# Patient Record
Sex: Female | Born: 1935 | Race: Black or African American | Hispanic: No | Marital: Married | Smoking: Former smoker
Health system: Southern US, Community
[De-identification: ages and names within clinical notes are randomized; demographics above are authoritative.]

## PROBLEM LIST (undated history)

## (undated) DIAGNOSIS — K5792 Diverticulitis of intestine, part unspecified, without perforation or abscess without bleeding: Secondary | ICD-10-CM

## (undated) DIAGNOSIS — K56699 Other intestinal obstruction unspecified as to partial versus complete obstruction: Secondary | ICD-10-CM

## (undated) DIAGNOSIS — E785 Hyperlipidemia, unspecified: Secondary | ICD-10-CM

## (undated) DIAGNOSIS — M5412 Radiculopathy, cervical region: Secondary | ICD-10-CM

## (undated) DIAGNOSIS — J449 Chronic obstructive pulmonary disease, unspecified: Secondary | ICD-10-CM

## (undated) DIAGNOSIS — M47812 Spondylosis without myelopathy or radiculopathy, cervical region: Secondary | ICD-10-CM

## (undated) HISTORY — PX: COLONOSCOPY: SHX174

## (undated) HISTORY — DX: Radiculopathy, cervical region: M54.12

## (undated) HISTORY — DX: Spondylosis without myelopathy or radiculopathy, cervical region: M47.812

## (undated) HISTORY — DX: Chronic obstructive pulmonary disease, unspecified: J44.9

## (undated) HISTORY — PX: EYE SURGERY: SHX253

## (undated) HISTORY — DX: Hyperlipidemia, unspecified: E78.5

---

## 1998-08-30 ENCOUNTER — Encounter: Payer: Self-pay | Admitting: Internal Medicine

## 1998-08-30 ENCOUNTER — Ambulatory Visit (HOSPITAL_COMMUNITY): Admission: RE | Admit: 1998-08-30 | Discharge: 1998-08-30 | Payer: Self-pay | Admitting: Internal Medicine

## 2002-01-01 ENCOUNTER — Inpatient Hospital Stay (HOSPITAL_COMMUNITY): Admission: EM | Admit: 2002-01-01 | Discharge: 2002-01-02 | Payer: Self-pay | Admitting: Emergency Medicine

## 2002-01-07 ENCOUNTER — Encounter: Admission: RE | Admit: 2002-01-07 | Discharge: 2002-01-07 | Payer: Self-pay | Admitting: Internal Medicine

## 2002-01-07 ENCOUNTER — Encounter: Payer: Self-pay | Admitting: Internal Medicine

## 2003-02-27 ENCOUNTER — Encounter: Payer: Self-pay | Admitting: Internal Medicine

## 2003-02-27 ENCOUNTER — Encounter: Admission: RE | Admit: 2003-02-27 | Discharge: 2003-02-27 | Payer: Self-pay | Admitting: Internal Medicine

## 2004-10-21 ENCOUNTER — Encounter: Admission: RE | Admit: 2004-10-21 | Discharge: 2004-10-21 | Payer: Self-pay | Admitting: Internal Medicine

## 2005-05-07 ENCOUNTER — Ambulatory Visit: Payer: Self-pay | Admitting: Internal Medicine

## 2005-11-05 ENCOUNTER — Encounter: Admission: RE | Admit: 2005-11-05 | Discharge: 2005-11-05 | Payer: Self-pay | Admitting: Internal Medicine

## 2005-11-17 ENCOUNTER — Ambulatory Visit: Payer: Self-pay | Admitting: Internal Medicine

## 2005-11-20 ENCOUNTER — Ambulatory Visit: Payer: Self-pay | Admitting: Internal Medicine

## 2006-11-25 ENCOUNTER — Encounter: Admission: RE | Admit: 2006-11-25 | Discharge: 2006-11-25 | Payer: Self-pay | Admitting: Internal Medicine

## 2006-12-10 ENCOUNTER — Emergency Department (HOSPITAL_COMMUNITY): Admission: EM | Admit: 2006-12-10 | Discharge: 2006-12-10 | Payer: Self-pay | Admitting: Family Medicine

## 2008-01-04 ENCOUNTER — Encounter: Admission: RE | Admit: 2008-01-04 | Discharge: 2008-01-04 | Payer: Self-pay | Admitting: Internal Medicine

## 2008-10-03 ENCOUNTER — Encounter (INDEPENDENT_AMBULATORY_CARE_PROVIDER_SITE_OTHER): Payer: Self-pay | Admitting: *Deleted

## 2008-10-29 ENCOUNTER — Encounter: Payer: Self-pay | Admitting: Internal Medicine

## 2009-09-10 ENCOUNTER — Ambulatory Visit: Payer: Self-pay | Admitting: Endocrinology

## 2009-09-10 DIAGNOSIS — J069 Acute upper respiratory infection, unspecified: Secondary | ICD-10-CM | POA: Insufficient documentation

## 2009-09-10 DIAGNOSIS — F17200 Nicotine dependence, unspecified, uncomplicated: Secondary | ICD-10-CM | POA: Insufficient documentation

## 2009-09-20 ENCOUNTER — Telehealth: Payer: Self-pay | Admitting: Gastroenterology

## 2010-08-12 NOTE — Assessment & Plan Note (Signed)
Summary: SINUS/ LAST SAW DR Jonny Ruiz IN 2007/ NEEDS TO RE-EST/NWS   Vital Signs:  Patient profile:   77 year old female Height:      63 inches (160.02 cm) Weight:      132.50 pounds (60.23 kg) BMI:     23.56 O2 Sat:      97 % on Room air Temp:     98.2 degrees F (36.78 degrees C) oral Pulse rate:   73 / minute BP sitting:   128 / 80  (left arm) Cuff size:   regular  Vitals Entered By: Josph Macho RMA (September 10, 2009 2:46 PM)  O2 Flow:  Room air CC: Sinus, head hurts X2-3weeks, fever X1week/ CF   CC:  Sinus, head hurts X2-3weeks, and fever X1week/ CF.  History of Present Illness: pt states 2 weeks of nasal congestion, bifrontal headache.  she has associated fever x 2 days.  she smokes 1 1/2 packs/day.    Current Medications (verified): 1)  Benadryl .... As Needed  Allergies (verified): No Known Drug Allergies  Past History:  Past Medical History: SMOKER (ICD-305.1)  Review of Systems  The patient denies dyspnea on exertion and prolonged cough.    Physical Exam  General:  normal appearance.   Head:  head: no deformity eyes: no periorbital swelling, no proptosis external nose and ears are normal mouth: no lesion seen Neck:  Supple without thyroid enlargement or tenderness.  Lungs:  Clear to auscultation bilaterally. Normal respiratory effort.    Impression & Recommendations:  Problem # 1:  URI (ICD-465.9) Assessment New  Problem # 2:  SMOKER (ICD-305.1)  Medications Added to Medication List This Visit: 1)  Benadryl  .... As needed 2)  Cefuroxime Axetil 250 Mg Tabs (Cefuroxime axetil) .Marland Kitchen.. 1 two times a day  Other Orders: Prescription Created Electronically [id number]) New Patient Level III (60454)  Patient Instructions: 1)  cefuroxime 250 mg two times a day.   2)  change benadryl to loratadine-d (non-prescription) as needed for congestion.   3)  we discussed cigarette smoking medications.   4)  please make an appointment with dr  Jonny Ruiz. Prescriptions: CEFUROXIME AXETIL 250 MG TABS (CEFUROXIME AXETIL) 1 two times a day  #14 x 0   Entered and Authorized by:   Minus Breeding MD   Signed by:   Minus Breeding MD on 09/10/2009   Method used:   Electronically to        [facility]  [phone]* (retail)       [address]       [place], [place]  [id number]       Ph: [phone] or [phone]       Fax: [phone]   RxID:   [id number]

## 2010-08-12 NOTE — Progress Notes (Signed)
Summary: Schedule recall colonoscopy   Phone Note Outgoing Call Call back at Home Phone [phone]   Call placed by: Christie Nottingham CMA Duncan Dull),  September 20, 2009 4:02 PM Call placed to: Patient Summary of Call: Called pt to schedule recall colonoscopy. Pt states she is very busy this month and the next. She states she will call back next month to schedule her colonscopy for May. Informed her the importance of scheduling her procedure b/c of her family history of colon cancer.  Initial call taken by: Christie Nottingham CMA Duncan Dull),  September 20, 2009 4:04 PM

## 2010-09-03 ENCOUNTER — Encounter: Payer: Self-pay | Admitting: Internal Medicine

## 2010-09-03 ENCOUNTER — Other Ambulatory Visit: Payer: Self-pay | Admitting: Internal Medicine

## 2010-09-03 ENCOUNTER — Encounter (INDEPENDENT_AMBULATORY_CARE_PROVIDER_SITE_OTHER): Payer: Self-pay | Admitting: *Deleted

## 2010-09-03 ENCOUNTER — Ambulatory Visit (INDEPENDENT_AMBULATORY_CARE_PROVIDER_SITE_OTHER): Payer: Medicare Other | Admitting: Internal Medicine

## 2010-09-03 ENCOUNTER — Other Ambulatory Visit: Payer: Medicare Other

## 2010-09-03 DIAGNOSIS — R5383 Other fatigue: Secondary | ICD-10-CM | POA: Insufficient documentation

## 2010-09-03 DIAGNOSIS — M79609 Pain in unspecified limb: Secondary | ICD-10-CM

## 2010-09-03 DIAGNOSIS — M5412 Radiculopathy, cervical region: Secondary | ICD-10-CM | POA: Insufficient documentation

## 2010-09-03 DIAGNOSIS — R5381 Other malaise: Secondary | ICD-10-CM

## 2010-09-03 DIAGNOSIS — E785 Hyperlipidemia, unspecified: Secondary | ICD-10-CM

## 2010-09-03 HISTORY — DX: Hyperlipidemia, unspecified: E78.5

## 2010-09-03 HISTORY — DX: Radiculopathy, cervical region: M54.12

## 2010-09-03 LAB — CBC WITH DIFFERENTIAL/PLATELET
Basophils Absolute: 0 10*3/uL (ref 0.0–0.1)
Eosinophils Relative: 1.4 % (ref 0.0–5.0)
HCT: 42.2 % (ref 36.0–46.0)
Hemoglobin: 14.2 g/dL (ref 12.0–15.0)
Lymphocytes Relative: 42.8 % (ref 12.0–46.0)
Monocytes Relative: 8.7 % (ref 3.0–12.0)
Neutro Abs: 3.7 10*3/uL (ref 1.4–7.7)
RDW: 15 % — ABNORMAL HIGH (ref 11.5–14.6)
WBC: 7.8 10*3/uL (ref 4.5–10.5)

## 2010-09-03 LAB — LDL CHOLESTEROL, DIRECT: Direct LDL: 187.3 mg/dL

## 2010-09-03 LAB — URINALYSIS
Hgb urine dipstick: NEGATIVE
Nitrite: NEGATIVE
Specific Gravity, Urine: 1.02 (ref 1.000–1.030)
Urobilinogen, UA: 0.2 (ref 0.0–1.0)

## 2010-09-03 LAB — HEPATIC FUNCTION PANEL
ALT: 11 U/L (ref 0–35)
AST: 26 U/L (ref 0–37)
Albumin: 4 g/dL (ref 3.5–5.2)
Alkaline Phosphatase: 53 U/L (ref 39–117)
Total Bilirubin: 0.5 mg/dL (ref 0.3–1.2)

## 2010-09-03 LAB — BASIC METABOLIC PANEL
Calcium: 9.5 mg/dL (ref 8.4–10.5)
GFR: 118.08 mL/min (ref >60.00)
Glucose, Bld: 82 mg/dL (ref 70–99)
Potassium: 4.4 mEq/L (ref 3.5–5.1)
Sodium: 143 mEq/L (ref 135–145)

## 2010-09-03 LAB — LIPID PANEL
HDL: 78.9 mg/dL (ref >39.00)
VLDL: 16 mg/dL (ref 0.0–40.0)

## 2010-09-04 ENCOUNTER — Other Ambulatory Visit (HOSPITAL_COMMUNITY): Payer: Self-pay | Admitting: Gastroenterology

## 2010-09-04 DIAGNOSIS — M5412 Radiculopathy, cervical region: Secondary | ICD-10-CM

## 2010-09-09 NOTE — Assessment & Plan Note (Signed)
Summary: L ARM NUMBNESS-ACHE----STC   Vital Signs:  Patient profile:   77 year old female Height:      62 inches Weight:      131.38 pounds BMI:     24.12 O2 Sat:      96 % on Room air Temp:     98.5 degrees F oral Pulse rate:   81 / minute BP sitting:   130 / 78  (left arm) Cuff size:   regular  Vitals Entered By: Zella Ball Ewing CMA Duncan Dull) (September 03, 2010 1:55 PM)  O2 Flow:  Room air  Preventive Care Screening     declines flu, pneumonia, tetanus;  is due for colonscopy and assents to this (husbnad urges her)   CC: Left arm numbness, left hand tingles, left leg pain/RE   CC:  Left arm numbness, left hand tingles, and left leg pain/RE.  History of Present Illness: here for f/u  - normally healthy overall and avoids doctors;  Pt denies CP, worsening sob, doe, wheezing, orthopnea, pnd, worsening LE edema, palps, dizziness or syncope  Pt denies new neuro symptoms such as headache, facial or extremity weakness  Pt denies polydipsia, polyuria   Overall good compliance with meds, trying to follow low chol diet, wt stable, little excercise however  Does not take meds or ASA.  Denies worsening depressive symptoms, suicidal ideation, or panic, though may have some ongoing anxiety but stable.  No fever, wt loss, night sweats, loss of appetite or other constitutional symptoms  Pt states good ability with ADL's, low fall risk, home safety reviewed and adequate, no significant change in hearing or vision, trying to follow lower chol diet, and occasionally active only with regular excercise.  Does have some LUE pain and numbness that seems worst at the left upper arm near the shoulder and less the more distal arm, but some feeling in the left neck as well;  has FROM of the shoulder with some incrased discomfort with abduction, but mild only, and not clear if this is the pain she refers to.  Pain overall mild, nonexertional, no better or worse with anything.  Not tried any otc med such as tylenol.    Also with pain and tenderness (without swelling, redness, weakness or numbness ) to the post left calf and heel area for over 1 wk without no obvious inciting event such as prolonged standing or walking, or trauma, fever, wt loss or LBP.  Does also have ongoing mild fatigue, but no daytime somnolence or other today  Preventive Screening-Counseling & Management  Alcohol-Tobacco     Smoking Status: current      Drug Use:  no.    Problems Prior to Update: 1)  Uri  (ICD-465.9) 2)  Smoker  (ICD-305.1)  Medications Prior to Update: 1)  Cefuroxime Axetil 250 Mg Tabs (Cefuroxime Axetil) .Marland Kitchen.. 1 Two Times A Day  Current Medications (verified): 1)  Aspir-Low 81 Mg Tbec (Aspirin) .Marland Kitchen.. 1po Once Daily 2)  Tramadol Hcl 100 Mg Xr24h-Tab (Tramadol Hcl) .Marland Kitchen.. 1 By Mouth Once Daily  As Needed Pain  Allergies (verified): No Known Drug Allergies  Past History:  Family History: Last updated: 09/03/2010 HTN  Social History: Last updated: 09/03/2010 Married Current Smoker Alcohol use-no Drug use-no Retired 1 son  Risk Factors: Smoking Status: current (09/03/2010)  Past Medical History: Reviewed history from 09/10/2009 and no changes required. SMOKER (ICD-305.1)  Past Surgical History: Denies surgical history  Family History: HTN  Social History: Reviewed history and no changes required.  Married Current Smoker Alcohol use-no Drug use-no Retired 1 sonSmoking Status:  current Drug Use:  no  Review of Systems  The patient denies anorexia, fever, vision loss, decreased hearing, hoarseness, chest pain, syncope, dyspnea on exertion, peripheral edema, prolonged cough, headaches, hemoptysis, abdominal pain, melena, hematochezia, severe indigestion/heartburn, hematuria, muscle weakness, suspicious skin lesions, transient blindness, difficulty walking, depression, unusual weight change, abnormal bleeding, enlarged lymph nodes, and angioedema.         all otherwise negative per pt -     Physical Exam  General:  alert and well-developed.   Head:  normocephalic and atraumatic.   Eyes:  vision grossly intact, pupils equal, and pupils round.   Ears:  R ear normal and L ear normal.   Nose:  no external deformity and no nasal discharge.   Mouth:  no gingival abnormalities and pharynx pink and moist.   Neck:  supple and no masses.   Lungs:  normal respiratory effort and normal breath sounds.   Heart:  normal rate and regular rhythm.   Abdomen:  soft, non-tender, and normal bowel sounds.   Msk:  no joint tenderness and no joint swelling. except for tender to left shoudler bursa area ; also marked tender to the left achilles area without sweling or redness Extremities:  no edema, no erythema  Neurologic:  strength normal in all extremities, sensation intact to light touch, gait normal, and DTRs symmetrical and normal.   Skin:  color normal and no rashes.   Psych:  not depressed appearing and moderately anxious.     Impression & Recommendations:  Problem # 1:  ARM PAIN, LEFT (ICD-729.5)  suspect left shoudler bursitis +/- left cervical radicular symptoms;  for c-spine MRI, pain med, refer ortho;  ECG also reviewed today - sinus with PAC  Orders: EKG w/ Interpretation (93000) Orthopedic Surgeon Referral (Ortho Surgeon)  Problem # 2:  LEG PAIN, LEFT (ICD-729.5) c/w achilles tendonitis - treat as above, f/u any worsening signs or symptoms  - with tramadol as needed   Problem # 3:  FATIGUE (ICD-780.79)  exam benign, to check labs below; follow with expectant management   Orders: TLB-BMP (Basic Metabolic Panel-BMET) (80048-METABOL) TLB-CBC Platelet - w/Differential (85025-CBCD) TLB-Hepatic/Liver Function Pnl (80076-HEPATIC) TLB-TSH (Thyroid Stimulating Hormone) (84443-TSH) TLB-Udip ONLY (81003-UDIP)  Problem # 4:  HYPERLIPIDEMIA (ICD-272.4)  mild  in past at best - Pt to continue diet efforts, good med tolerance; to check labs - goal LDL less than 100    Orders: TLB-Lipid Panel (80061-LIPID)  Complete Medication List: 1)  Aspir-low 81 Mg Tbec (Aspirin) .Marland Kitchen.. 1po once daily 2)  Tramadol Hcl 100 Mg Xr24h-tab (Tramadol hcl) .Marland Kitchen.. 1 by mouth once daily  as needed pain  Other Orders: Gastroenterology Referral (GI) Radiology Referral (Radiology)  Patient Instructions: 1)  Your EKG was ok today 2)  Please stop smoking 3)  Please go to the Lab in the basement for your blood and/or urine tests today  4)  You will be contacted about the referral(s) to: colonoscopy, MRI for the neck, and the orthopedic referral 5)  Please remember to call for your yearly mammogram (I would suggest [facility] on [address], or [employer] on [address]) 6)  Please take all new medications as prescribed - the tramadol for pain as needed (or tylenol 8 Hour might work ok) 7)  Please call the number on the [facility] Card for results of your testing  8)  Please schedule a follow-up appointment in 1 year, or sooner if needed  9)  Please also stop at the front desk to clarify your insurance as they are not aware of the medicare primary insurance apparently 10)  Take an Aspirin every day - 81 mg - 1 per day - COATED only - to help prevent stroke or heart attack Prescriptions: TRAMADOL HCL 100 MG XR24H-TAB (TRAMADOL HCL) 1 by mouth once daily  as needed pain  #30 x 1   Entered and Authorized by:   Corwin Levins MD   Signed by:   Corwin Levins MD on 09/03/2010   Method used:   Print then Give to Patient   RxID:   [id number]    Orders Added: 1)  EKG w/ Interpretation [93000] 2)  TLB-BMP (Basic Metabolic Panel-BMET) [80048-METABOL] 3)  TLB-CBC Platelet - w/Differential [85025-CBCD] 4)  TLB-Hepatic/Liver Function Pnl [80076-HEPATIC] 5)  TLB-TSH (Thyroid Stimulating Hormone) [84443-TSH] 6)  TLB-Lipid Panel [80061-LIPID] 7)  TLB-Udip ONLY [81003-UDIP] 8)  Gastroenterology Referral [GI] 9)  Radiology Referral [Radiology] 10)  Orthopedic Surgeon Referral [Ortho  Surgeon] 11)  Est. Patient Level V [14782]

## 2010-09-16 ENCOUNTER — Encounter (INDEPENDENT_AMBULATORY_CARE_PROVIDER_SITE_OTHER): Payer: Self-pay | Admitting: *Deleted

## 2010-09-16 ENCOUNTER — Ambulatory Visit (HOSPITAL_COMMUNITY)
Admission: RE | Admit: 2010-09-16 | Discharge: 2010-09-16 | Disposition: A | Discharge status: Home / Self Care | Payer: Medicare Other | Source: Ambulatory Visit | Attending: Gastroenterology | Admitting: Gastroenterology

## 2010-09-16 DIAGNOSIS — M5412 Radiculopathy, cervical region: Secondary | ICD-10-CM

## 2010-09-16 DIAGNOSIS — R209 Unspecified disturbances of skin sensation: Secondary | ICD-10-CM | POA: Insufficient documentation

## 2010-09-16 DIAGNOSIS — M47812 Spondylosis without myelopathy or radiculopathy, cervical region: Secondary | ICD-10-CM | POA: Insufficient documentation

## 2010-09-16 DIAGNOSIS — R29898 Other symptoms and signs involving the musculoskeletal system: Secondary | ICD-10-CM | POA: Insufficient documentation

## 2010-09-23 NOTE — Letter (Signed)
Summary: Pre Visit Letter Revised  [facility]  [address] [place], [place] [address]   Phone: [phone]  Fax: [phone]        09/16/2010 MRN: [id number] Theresa Arnold [address] [place], [place]  [address]  [place]             Procedure Date:  October 28, 2010   recall col Dr Nedra Hai to the Gastroenterology Division at [facility].    You are scheduled to see a nurse for your pre-procedure visit on October 14, 2010 at 1:00pm on the 3rd floor at [facility], [address].  We ask that you try to arrive at our office 15 minutes prior to your appointment time to allow for check-in.  Please take a minute to review the attached form.  If you answer "Yes" to one or more of the questions on the first page, we ask that you call the person listed at your earliest opportunity.  If you answer "No" to all of the questions, please complete the rest of the form and bring it to your appointment.    Your nurse visit will consist of discussing your medical and surgical history, your immediate family medical history, and your medications.   If you are unable to list all of your medications on the form, please bring the medication bottles to your appointment and we will list them.  We will need to be aware of both prescribed and over the counter drugs.  We will need to know exact dosage information as well.    Please be prepared to read and sign documents such as consent forms, a financial agreement, and acknowledgement forms.  If necessary, and with your consent, a friend or relative is welcome to sit-in on the nurse visit with you.  Please bring your insurance card so that we may make a copy of it.  If your insurance requires a referral to see a specialist, please bring your referral form from your primary care physician.  No co-pay is required for this nurse visit.     If you cannot keep your appointment, please call [phone] to cancel or reschedule  prior to your appointment date.  This allows [place] the opportunity to schedule an appointment for another patient in need of care.    Thank you for choosing [facility] for your medical needs.  We appreciate the opportunity to care for you.  Please visit [place] at our website  to learn more about our practice.  Sincerely, The Gastroenterology Division

## 2010-10-07 ENCOUNTER — Other Ambulatory Visit (INDEPENDENT_AMBULATORY_CARE_PROVIDER_SITE_OTHER): Payer: Medicare Other

## 2010-10-07 ENCOUNTER — Other Ambulatory Visit (INDEPENDENT_AMBULATORY_CARE_PROVIDER_SITE_OTHER): Payer: Medicare Other | Admitting: Internal Medicine

## 2010-10-07 ENCOUNTER — Ambulatory Visit (INDEPENDENT_AMBULATORY_CARE_PROVIDER_SITE_OTHER): Payer: Medicare Other | Admitting: Internal Medicine

## 2010-10-07 ENCOUNTER — Encounter: Payer: Self-pay | Admitting: Internal Medicine

## 2010-10-07 DIAGNOSIS — R5381 Other malaise: Secondary | ICD-10-CM

## 2010-10-07 DIAGNOSIS — M79609 Pain in unspecified limb: Secondary | ICD-10-CM

## 2010-10-07 DIAGNOSIS — E785 Hyperlipidemia, unspecified: Secondary | ICD-10-CM

## 2010-10-07 DIAGNOSIS — Z79899 Other long term (current) drug therapy: Secondary | ICD-10-CM

## 2010-10-07 DIAGNOSIS — R5383 Other fatigue: Secondary | ICD-10-CM

## 2010-10-07 LAB — HEPATIC FUNCTION PANEL: Albumin: 4.1 g/dL (ref 3.5–5.2)

## 2010-10-07 LAB — LIPID PANEL
Cholesterol: 202 mg/dL — ABNORMAL HIGH (ref 0–200)
HDL: 85.9 mg/dL (ref >39.00)
Triglycerides: 45 mg/dL (ref 0.0–149.0)
VLDL: 9 mg/dL (ref 0.0–40.0)

## 2010-10-07 NOTE — Progress Notes (Signed)
  Subjective:    Patient ID: Theresa Arnold, female    DOB: [birth year], 77 y.o.   MRN: [id number]  HPI  Here to f/u, now s/p MRI neck, did see orthopedic - now taking prednisone (finished in 3 days) for possible left CTS (left arm improved, only taken one pain pill since then) - still tingles somewhat but no pain or weakness;  Overall good compliance with treatment, and good medicine tolerability. , including the statin - without myalgias, constipation or slowed cognitiion.  Pt denies chest pain, increased sob or doe, wheezing, orthopnea, PND, increased LE swelling, palpitations, dizziness or syncope.    Pt denies new neurological symptoms such as new headache, or facial or extremity weakness or numbness.   Pt denies polydipsia, polyuria, Pt states overall good compliance with meds, trying to follow lower cholesterol, diabetic diet, wt overall stable but little exercise however.      Pt denies fever, wt loss, night sweats, loss of appetite, or other constitutional symptoms , does have some fatigue without hypersomnia or other compalints  Review of Systems  Constitutional: Negative for diaphoresis and unexpected weight change.  HENT: Negative for drooling and tinnitus.   Eyes: Negative for photophobia and visual disturbance.  Respiratory: Negative for choking and stridor.   Gastrointestinal: Negative for vomiting and blood in stool.  Genitourinary: Negative for hematuria and decreased urine volume.  Musculoskeletal: Negative for gait problem.  Skin: Negative for color change and wound.  Neurological: Negative for tremors and numbness.  Psychiatric/Behavioral: Negative for decreased concentration. The patient is not hyperactive.       Review of Systems     Objective:   Physical Exam Physical Exam  VS noted Constitutional: Pt appears well-developed and well-nourished.  HENT: Head: Normocephalic.  Right Ear: External ear normal.  Left Ear: External ear normal.  Eyes: Conjunctivae and EOM  are normal. Pupils are equal, round, and reactive to light.  Neck: Normal range of motion. Neck supple.  Cardiovascular: Normal rate and regular rhythm.   Pulmonary/Chest: Effort normal and breath sounds normal.  Abd:  Soft, NT, non-distended, + BS Neurological: Pt is alert. No cranial nerve deficit.  Skin: Skin is warm. No erythema.  Psychiatric: Pt behavior is normal. Thought content normal.         Assessment & Plan:

## 2010-10-07 NOTE — Assessment & Plan Note (Signed)
Also for lft's today to f/u on the statin

## 2010-10-07 NOTE — Patient Instructions (Signed)
Continue all other medications as before Please go to LAB in the Basement for the blood and/or urine tests to be done today Please call the number on the [facility] Card for results in 2-3 days Please followup in feb 2013 , or sooner if needed

## 2010-10-07 NOTE — Assessment & Plan Note (Addendum)
stable overall by hx and exam, ok to continue meds/tx as is , to check lipids today after 4 wks on statin, goal ldl < 100  No results found for this basename: LDLCALC

## 2010-10-07 NOTE — Assessment & Plan Note (Signed)
Improved after ortho eval and prelim dx left CTS; cont to follow, has 4 more days or prednisone, consider EMG/NCS or surgical evaluation

## 2010-10-07 NOTE — Assessment & Plan Note (Signed)
Mild , exam benign, Exam otherwise benign, follow with expectant management

## 2010-10-14 ENCOUNTER — Ambulatory Visit (AMBULATORY_SURGERY_CENTER): Payer: Medicare Other

## 2010-10-14 VITALS — Ht 62.0 in | Wt 129.6 lb

## 2010-10-14 DIAGNOSIS — Z1211 Encounter for screening for malignant neoplasm of colon: Secondary | ICD-10-CM

## 2010-10-14 MED ORDER — PEG-KCL-NACL-NASULF-NA ASC-C 100 G PO SOLR: 1.0000 | Freq: Once | ORAL | Status: AC

## 2010-10-28 ENCOUNTER — Ambulatory Visit (AMBULATORY_SURGERY_CENTER): Payer: Medicare Other | Admitting: Gastroenterology

## 2010-10-28 ENCOUNTER — Other Ambulatory Visit: Payer: Self-pay | Admitting: Gastroenterology

## 2010-10-28 ENCOUNTER — Ambulatory Visit (HOSPITAL_COMMUNITY)
Admission: RE | Admit: 2010-10-28 | Discharge: 2010-10-28 | Disposition: A | Discharge status: Home / Self Care | Payer: Medicare Other | Source: Ambulatory Visit | Attending: Gastroenterology | Admitting: Gastroenterology

## 2010-10-28 ENCOUNTER — Encounter: Payer: Self-pay | Admitting: Gastroenterology

## 2010-10-28 VITALS — BP 98/53 | HR 72 | Temp 97.7°F | Resp 22 | Ht 62.0 in | Wt 129.0 lb

## 2010-10-28 DIAGNOSIS — K5730 Diverticulosis of large intestine without perforation or abscess without bleeding: Secondary | ICD-10-CM | POA: Insufficient documentation

## 2010-10-28 DIAGNOSIS — Z800 Family history of malignant neoplasm of digestive organs: Secondary | ICD-10-CM | POA: Insufficient documentation

## 2010-10-28 DIAGNOSIS — R933 Abnormal findings on diagnostic imaging of other parts of digestive tract: Secondary | ICD-10-CM

## 2010-10-28 DIAGNOSIS — Z1211 Encounter for screening for malignant neoplasm of colon: Secondary | ICD-10-CM

## 2010-10-28 DIAGNOSIS — D126 Benign neoplasm of colon, unspecified: Secondary | ICD-10-CM

## 2010-10-28 MED ORDER — SODIUM CHLORIDE 0.9 % IV SOLN: 500.0000 mL | INTRAVENOUS | Status: DC

## 2010-10-28 NOTE — Patient Instructions (Signed)
Pt instructed to go [facility] rad. For barium enema ,report faxed to [facility], information given on diverticulosis and polyps.

## 2010-10-29 ENCOUNTER — Telehealth: Payer: Self-pay

## 2010-10-29 ENCOUNTER — Telehealth: Payer: Self-pay | Admitting: *Deleted

## 2010-10-29 NOTE — Telephone Encounter (Signed)
Pt set up to see Dr. Arlyce Dice 11/24/10@10 :30am. Pt aware of appt date and time.

## 2010-10-29 NOTE — Telephone Encounter (Signed)
Message copied by Chrystie Nose on Wed Oct 29, 2010  9:50 AM ------      Message from: Merri Ray      Created: Wed Oct 29, 2010  8:29 AM                   ----- Message -----         From: Louis Meckel, MD         Sent: 10/29/2010   8:22 AM           To: Merri Ray, CMA            Needs f/u OV

## 2010-10-29 NOTE — Telephone Encounter (Signed)
Follow up Call- Patient questions:  Do you have a fever, pain , or abdominal swelling? no Pain Score  0 *  Have you tolerated food without any problems? yes  Have you been able to return to your normal activities? yes  Do you have any questions about your discharge instructions: Diet   no Medications  no Follow up visit  no  Do you have questions or concerns about your Care? yes -Reviewed recommendations from procedure report with pt. Pt verbalized understanding. Actions: * If pain score is 4 or above: No action needed, pain <4.

## 2010-11-24 ENCOUNTER — Ambulatory Visit (INDEPENDENT_AMBULATORY_CARE_PROVIDER_SITE_OTHER): Payer: Medicare Other | Admitting: Gastroenterology

## 2010-11-24 ENCOUNTER — Encounter: Payer: Self-pay | Admitting: Gastroenterology

## 2010-11-24 DIAGNOSIS — D126 Benign neoplasm of colon, unspecified: Secondary | ICD-10-CM

## 2010-11-24 DIAGNOSIS — K5730 Diverticulosis of large intestine without perforation or abscess without bleeding: Secondary | ICD-10-CM | POA: Insufficient documentation

## 2010-11-24 NOTE — Assessment & Plan Note (Signed)
Because of severity of her disease at future colonoscopy she will receive propofol sedation and glucagon.

## 2010-11-24 NOTE — Patient Instructions (Addendum)
Polyps, Colon  A polyp is extra tissue that grows inside your body. Colon polyps grow in the large intestine. The large intestine, also called the colon, is part of your digestive system. It is a long, hollow tube at the end of your digestive tract where your body makes and stores stool. Most polyps are not dangerous. They are benign. This means they are not cancerous. But over time, some types of polyps can turn into cancer. Polyps that are smaller than a pea are usually not harmful. But larger polyps could someday become or may already be cancerous. To be safe, doctors remove all polyps and test them.  WHO GETS POLYPS? Anyone can get polyps, but certain people are more likely than others. You may have a greater chance of getting polyps if:  You are over 50.   You have had polyps before.   Someone in your family has had polyps.   Someone in your family has had cancer of the large intestine.   Find out if someone in your family has had polyps. You may also be more likely to get polyps if you:   Eat a lot of fatty foods   Smoke   Drink alcohol   Do not exercise  Eat too much  SYMPTOMS Most small polyps do not cause symptoms. People often do not know they have one until their caregiver finds it during a regular checkup or while testing them for something else. Some people do have symptoms like these:  Bleeding from the anus. You might notice blood on your underwear or on toilet paper after you have had a bowel movement.   Constipation or diarrhea that lasts more than a week.   Blood in the stool. Blood can make stool look black or it can show up as red streaks in the stool.  If you have any of these symptoms, see your caregiver. HOW DOES THE DOCTOR TEST FOR POLYPS? The doctor can use four tests to check for polyps:  Digital rectal exam. The caregiver wears gloves and checks your rectum (the last part of the large intestine) to see if it feels normal. This test would find polyps only  in the rectum. Your caregiver may need to do one of the other tests listed below to find polyps higher up in the intestine.   Barium enema. The caregiver puts a liquid called barium into your rectum before taking x-rays of your large intestine. Barium makes your intestine look white in the pictures. Polyps are dark, so they are easy to see.   Sigmoidoscopy. With this test, the caregiver can see inside your large intestine. A thin flexible tube is placed into your rectum. The device is called a sigmoidoscope, which has a light and a tiny video camera in it. The caregiver uses the sigmoidoscope to look at the last third of your large intestine.   Colonoscopy. This test is like sigmoidoscopy, but the caregiver looks at all of the large intestine. It usually requires sedation. This is the most common method for finding and removing polyps.  TREATMENT  The caregiver will remove the polyp during sigmoidoscopy or colonoscopy. The polyp is then tested for cancer.   If you have had polyps, your caregiver may want you to get tested regularly in the future.  PREVENTION There is not one sure way to prevent polyps. You might be able to lower your risk of getting them if you:  Eat more fruits and vegetables and less fatty food.     Do not smoke.   Avoid alcohol.   Exercise every day.   Lose weight if you are overweight.   Eating more calcium and folate can also lower your risk of getting polyps. Some foods that are rich in calcium are milk, cheese, and broccoli. Some foods that are rich in folate are chickpeas, kidney beans, and spinach.   Aspirin might help prevent polyps. Studies are under way.  Document Released: 03/25/2004 Document Re-Released: 12/17/2009 [facility] Patient Information 2011 [place], [place].Cc. Bayard Males Diverticulosis Diverticulosis is a common condition that develops when small pouches (diverticula) form in the wall of the colon. The risk of diverticulosis increases with age.  It happens more often in people who eat a low-fiber diet. Most individuals with diverticulosis have no symptoms. Those individuals with symptoms usually experience belly (abdominal) pain, constipation, or loose stools (diarrhea). HOME CARE INSTRUCTIONS  Increase the amount of fiber in your diet as directed by your caregiver or dietician. This may reduce symptoms of diverticulosis.   Your caregiver may recommend taking a dietary fiber supplement.   Drink at least 6 to 8 glasses of water each day to prevent constipation.   Try not to strain when you have a bowel movement.   Your caregiver may recommend avoiding nuts and seeds to prevent complications, although this is still an uncertain benefit.   Only take over-the-counter or prescription medicines for pain, discomfort, or fever as directed by your caregiver.  FOODS HAVING HIGH FIBER CONTENT INCLUDE:  Fruits. Apple, peach, pear, tangerine, raisins, prunes.   Vegetables. Brussels sprouts, asparagus, broccoli, cabbage, carrot, cauliflower, romaine lettuce, spinach, summer squash, tomato, winter squash, zucchini.   Starchy Vegetables. Baked beans, kidney beans, lima beans, split peas, lentils, potatoes (with skin).   Grains. Whole wheat bread, brown rice, bran flake cereal, plain oatmeal, white rice, shredded wheat, bran muffins.  SEEK IMMEDIATE MEDICAL CARE IF:  You develop increasing pain or severe bloating.   You have an oral temperature above 100  , not controlled by medicine.   You develop vomiting or bowel movements that are bloody or black.  Document Released: 03/26/2004 Document Re-Released: 12/17/2009 [facility] Patient Information 2011 [place], [place].  Your procedure is scheduled on 12/01/2010 at 3pm

## 2010-11-24 NOTE — Progress Notes (Signed)
History of Present Illness:  Mrs. Storlie is a pleasant 77 year old Afro-American female referred at the request of Dr. Jonny Ruiz following screening colonoscopy.  Screening colonoscopy demonstrated a 3 mm sigmoid polyp. Severe diverticulosis with spasm prevented further examination of the colon. This was completed by barium enema which did not demonstrate any other abnormalities. She has no GI complaints.    Review of Systems: Pertinent positive and negative review of systems were noted in the above HPI section. All other review of systems were otherwise negative.    Current Medications, Allergies, Past Medical History, Past Surgical History, Family History and Social History were reviewed in [employer] electronic medical record  Vital signs were reviewed in today's medical record. Physical Exam: General: Well developed , well nourished, no acute distress Head: Normocephalic and atraumatic Eyes:  sclerae anicteric, EOMI Ears: Normal auditory acuity Mouth: No deformity or lesions Lungs: Clear throughout to auscultation Heart: Regular rate and rhythm; no murmurs, rubs or bruits Abdomen: Soft, non tender and non distended. No masses, hepatosplenomegaly or hernias noted. Normal Bowel sounds Rectal:deferred Musculoskeletal: Symmetrical with no gross deformities  Pulses:  Normal pulses noted Extremities: No clubbing, cyanosis, edema or deformities noted Neurological: Alert oriented x 4, grossly nonfocal Psychological:  Alert and cooperative. Normal mood and affect

## 2010-11-24 NOTE — Assessment & Plan Note (Signed)
Plan sigmoidoscopy with polyp removal

## 2010-11-28 NOTE — Discharge Summary (Signed)
[place]. [facility]  Patient:    ASPIN, PALOMAREZ Visit Number: [id number] MRN: [id number]          Service Type: MED Location: [phone] Attending Physician:  Tresa Garter Dictated by:   Sonda Primes, M.D. [facility] Admit Date:  01/01/2002 Discharge Date: 01/02/2002   CC:         Corwin Levins, M.D. [facility]   Discharge Summary  HISTORY OF PRESENT ILLNESS AND HOSPITAL COURSE:  The patient is a 77 year old female who presented to the emergency room with left arm pain, numbness, and some chest discomfort was evaluated by Dr. Josie Saunders with a CT scan of the abdomen and chest and admitted to rule out MI.  For the details please address to my history and physical.  During the course of hospitalization, she had a series of troponin and CKs.  They were negative.  On the day of discharge, she is feeling well, she has no pain in the arm just some numbness in the hand, no neck pain.  Temperature 97.5, blood pressure 90/46, respirations 20, O2 saturations 96% on room air.  She is in no acute distress, looks well.  HEENT with moist mucosa.  Neck supple.  Lungs clear.  Heart regular, S1, S2, no murmur, no gallop.  Abdomen soft, nontender, no organomegaly, no mass felt. Lower extremities without edema.  LABORATORIES:  Troponin negative, CK negative.  B12 level pending.  CT scan of the chest negative for PE, there is some mild cardiomegaly. Abdominal CAT scan negative for AAA.  DISCHARGE DIAGNOSIS:  Atypical left arm pain likely due to cervical radiculopathy.  Myocardial infarction ruled out.  Would plan Cardiolite stress test as an outpatient.  May need cervical spine MRI if no better.  DISCHARGE MEDICATIONS: 1. Protonix 40 mg once a day for 10 days. 2. Darvocet-N 100 q.i.d. p.r.n. pain. 3. Ibuprofen 600 p.o. b.i.d. p.r.n. pain with food.  ACTIVITY:  As tolerated.  FOLLOWUP PLANS:  Dr. Jonny Ruiz in 5-7 days.  SPECIAL INSTRUCTIONS:  Call if problems. Dictated  by:   Sonda Primes, M.D. [facility] Attending Physician:  Tresa Garter DD:  01/02/02 TD:  01/03/02 Job: [id number]

## 2010-11-28 NOTE — H&P (Signed)
[place]. [facility]  Patient:    Theresa Arnold, Theresa Arnold Visit Number: [id number] MRN: [id number]          Service Type: MED Location: [phone] Attending Physician:  Tresa Garter Dictated by:   Sonda Primes, M.D. [facility] Admit Date:  01/01/2002 Discharge Date: 01/02/2002   CC:         Corwin Levins, M.D. [facility]   History and Physical  DATE OF BIRTH:  03-30-1932  CHIEF COMPLAINT:  Left arm pain.  HISTORY OF PRESENT ILLNESS:  The patient is a 77 year old female who presents with pain between shoulder blades and left arm pain and numbness off and on of couple of days duration.  She is taking a bunch of aspirin and Aleve for it. Never had these problems before.  They do not seem to be exertional.  No radiation to neck or chest.  Dr. Brayton Caves evaluated the patient and was concerned about the possibility of myocardial infarction.  She underwent a CT of the chest and abdomen to rule out aneurysm and embolism.  PAST MEDICAL HISTORY:  Unremarkable.  ALLERGIES:  No known drug allergies.  MEDICATIONS:  Aleve and aspirin.  NO prescription medications.  SOCIAL HISTORY:  Smokes one pack a day.  She is retired and she is married.  FAMILY HISTORY:  Sister with AAA, one brother with heart attack, and father with coronary artery disease.  REVIEW OF SYSTEMS:  Negative for chest pain, no indigestion, no syncope, no neurologic complaints other than numbness in fingers.  The rest is negative.  PHYSICAL EXAMINATION:  VITAL SIGNS:  Temperature 98.2, blood pressure 137/73, pulse 64, respirations 20, O2 saturation 98% on room air.  GENERAL:  She is in no acute distress.  HEENT:  Moist mucosa.  NECK:  Supple.  No thyromegaly or bruit.  LUNGS:  Clear to auscultation and percussion.  HEART:  S1 and S2.  No enlargement to percussion.  ABDOMEN:  Soft and nontender.  No organomegaly and normocephalic.  EXTREMITIES:  Lower extremities without edema.  Calves  nontender.  SKIN:  Clear.  NECK:  Nontender.  Slight tenderness behind the shoulder blades.  Shoulders with full range of motion.  Grip normal bilaterally.  LABORATORY DATA:  CT of the abdomen and chest; mild cardiomegaly, negative for PE or AAA.  Liver function tests normal.  Blood gas with pH 7.4, pCO2 38.5, pO2 62.  CK 141, MB 2.9%.  EKG with sinus bradycardia, first degree AV block, LAD.  ASSESSMENT: 1. Left shoulder pain, rule out myocardial infarction.  CK x3, troponin x3    q.8h.  Will not anticoagulate at present.  May need Cardiolite as an    outpatient. 2. Left arm pain and paresthesia.  Possible cervical radiculopathy.  Will use    NSAIDs outpatient therapy and workup. Dictated by:   Sonda Primes, M.D. [facility] Attending Physician:  Tresa Garter DD:  01/01/02 TD:  01/02/02 Job: [id number]

## 2010-12-01 ENCOUNTER — Ambulatory Visit (AMBULATORY_SURGERY_CENTER): Payer: Medicare Other | Admitting: Gastroenterology

## 2010-12-01 ENCOUNTER — Encounter: Payer: Self-pay | Admitting: Gastroenterology

## 2010-12-01 VITALS — BP 106/64 | HR 64 | Temp 98.7°F | Resp 16 | Ht 62.0 in | Wt 129.0 lb

## 2010-12-01 DIAGNOSIS — D126 Benign neoplasm of colon, unspecified: Secondary | ICD-10-CM

## 2010-12-01 DIAGNOSIS — Z86010 Personal history of colonic polyps: Secondary | ICD-10-CM

## 2010-12-01 DIAGNOSIS — K5730 Diverticulosis of large intestine without perforation or abscess without bleeding: Secondary | ICD-10-CM

## 2010-12-01 MED ORDER — SODIUM CHLORIDE 0.9 % IV SOLN: 500.0000 mL | INTRAVENOUS | Status: DC

## 2010-12-01 NOTE — Patient Instructions (Signed)
HANDOUT GIVEN ON POLYPS  WE WILL MAIL YOU A LETTER IN 1- WEEKS WITH RESULTS OF YOUR BIOPSY AND DR Marzetta Board RECOMMENDATIONS OF YOUR NEXT PROCEDURE YEAR

## 2010-12-02 ENCOUNTER — Telehealth: Payer: Self-pay

## 2010-12-02 NOTE — Telephone Encounter (Signed)

## 2011-04-07 ENCOUNTER — Telehealth: Payer: Self-pay | Admitting: *Deleted

## 2011-04-07 NOTE — Telephone Encounter (Signed)
Spoke w/patient. She c/o body aches and sinus pain. She is resting and took OTC "vick's cold medicine" with relief last night. Advised pt to come in for OV if symptoms continued or changed.

## 2011-04-08 ENCOUNTER — Encounter: Payer: Self-pay | Admitting: Internal Medicine

## 2011-04-08 ENCOUNTER — Other Ambulatory Visit (INDEPENDENT_AMBULATORY_CARE_PROVIDER_SITE_OTHER): Payer: Medicare Other

## 2011-04-08 ENCOUNTER — Ambulatory Visit (INDEPENDENT_AMBULATORY_CARE_PROVIDER_SITE_OTHER): Payer: Medicare Other | Admitting: Internal Medicine

## 2011-04-08 DIAGNOSIS — R509 Fever, unspecified: Secondary | ICD-10-CM | POA: Insufficient documentation

## 2011-04-08 DIAGNOSIS — B370 Candidal stomatitis: Secondary | ICD-10-CM

## 2011-04-08 DIAGNOSIS — J029 Acute pharyngitis, unspecified: Secondary | ICD-10-CM

## 2011-04-08 LAB — URINALYSIS, ROUTINE W REFLEX MICROSCOPIC
Nitrite: NEGATIVE
Specific Gravity, Urine: 1.025 (ref 1.000–1.030)
Total Protein, Urine: 30
Urine Glucose: NEGATIVE
Urobilinogen, UA: 1 (ref 0.0–1.0)

## 2011-04-08 MED ORDER — NYSTATIN 100000 UNIT/ML MT SUSP: 500000.0000 [IU] | Freq: Four times a day (QID) | OROMUCOSAL | Status: AC

## 2011-04-08 MED ORDER — LEVOFLOXACIN 250 MG PO TABS: 250.0000 mg | ORAL_TABLET | Freq: Every day | ORAL | Status: AC

## 2011-04-08 NOTE — Assessment & Plan Note (Signed)
Rather severe fever for her age, cant r/o strep - Mild to mod, for antibx course,  to f/u any worsening symptoms or concerns

## 2011-04-08 NOTE — Patient Instructions (Addendum)
Take all new medications as prescribed Continue all other medications as before Please go to LAB in the Basement for the blood and/or urine tests to be done today Please call the phone number [phone] (the [employer]Tree System) for results of testing in 2-3 days;  When calling, simply dial the number, and when prompted enter the MRN number above (the Medical Record Number) and the # key, then the message should start.  

## 2011-04-08 NOTE — Assessment & Plan Note (Signed)
?   Viral illness vs other - for UA as well

## 2011-04-08 NOTE — Progress Notes (Signed)
Subjective:    Patient ID: Theresa Arnold, female    DOB: 1931/09/02, 77 y.o.   MRN: [id number]  HPI  Here to f/u, apparently mentioned to scheduler something about fluid retention, but denies this today. No fluid retention today Here with 2-3 days onset HA, ST, fever up to 101 at home, thirsty, unusual fatigue, but no overt sinus pain/pressure, cough and Pt denies chest pain, increased sob or doe, wheezing, orthopnea, PND, increased LE swelling, palpitations, dizziness or syncope.  Does also have white coating to tongue for over a wk as well.   Denies urinary symptoms such as dysuria, frequency, urgency,or hematuria.   Pt denies fever, wt loss, night sweats, loss of appetite, or other constitutional symptoms except for the above.  Pt denies new neurological symptoms such as new headache, or facial or extremity weakness or numbness   Pt denies polydipsia, polyuria.  Denies urinary symptoms such as dysuria, frequency, urgency,or hematuria.  No ill contacts, except for exposure to ill grandchild recently. Past Medical History  Diagnosis Date  . HYPERLIPIDEMIA 09/03/2010  . CERVICAL RADICULOPATHY, LEFT 09/03/2010   Past Surgical History  Procedure Date  . Colonoscopy     reports that she has been smoking.  She has never used smokeless tobacco. She reports that she does not drink alcohol or use illicit drugs. family history includes Breast cancer in her sister; Colon cancer in her sister; Heart disease in her father; and Hypertension in her other. No Known Allergies Current Outpatient Prescriptions on File Prior to Visit  Medication Sig Dispense Refill  . aspirin 325 MG EC tablet Take 325 mg by mouth daily.        Marland Kitchen atorvastatin (LIPITOR) 10 MG tablet Take 10 mg by mouth daily.        . diphenhydrAMINE (BENADRYL) 25 mg capsule Take 25 mg by mouth every 6 (six) hours as needed.        . traMADol (ULTRAM-ER) 100 MG 24 hr tablet Take 100 mg by mouth daily as needed.         Current  Facility-Administered Medications on File Prior to Visit  Medication Dose Route Frequency Provider Last Rate Last Dose  . 0.9 %  sodium chloride infusion  500 mL Intravenous Continuous Louis Meckel, MD      . 0.9 %  sodium chloride infusion  500 mL Intravenous Continuous Louis Meckel, MD       Review of Systems Review of Systems  Constitutional: Negative for diaphoresis and unexpected weight change.  HENT: Negative for drooling and tinnitus.   Eyes: Negative for photophobia and visual disturbance.  Respiratory: Negative for choking and stridor.   Gastrointestinal: Negative for vomiting and blood in stool. .       Objective:   Physical Exam BP 106/60  Pulse 77  Temp(Src) 99.9 F (37.7 C) (Oral)  Ht 5\' 2"  (1.575 m)  Wt 124 lb 8 oz (56.473 kg)  BMI 22.77 kg/m2  SpO2 98% Physical Exam  VS noted, mild ill appaering Constitutional: Pt appears well-developed and well-nourished.  HENT: Head: Normocephalic.  Right Ear: External ear normal.  Left Ear: External ear normal.   Tongue with whitish coating/thrush like Bilat tm's mild erythema.  Sinus nontender.  Pharynx moderythema, no exudate or neck LA Eyes: Conjunctivae and EOM are normal. Pupils are equal, round, and reactive to light.  Neck: Normal range of motion. Neck supple.  Cardiovascular: Normal rate and regular rhythm.   Pulmonary/Chest: Effort normal and breath sounds  normal.  Abd:  Soft, NT, non-distended, + BS, no flank tender Neurological: Pt is alert. No cranial nerve deficit.  Skin: Skin is warm. No erythema. No LE edema, rash or cellultiis Psychiatric: Pt behavior is normal. Thought content normal.     Assessment & Plan:

## 2011-04-08 NOTE — Assessment & Plan Note (Signed)
Also for nystatin asd,  to f/u any worsening symptoms or concerns,

## 2011-04-10 LAB — URINE CULTURE

## 2011-05-10 ENCOUNTER — Inpatient Hospital Stay (INDEPENDENT_AMBULATORY_CARE_PROVIDER_SITE_OTHER)
Admission: RE | Admit: 2011-05-10 | Discharge: 2011-05-10 | Disposition: A | Discharge status: Home / Self Care | Payer: Medicare Other | Source: Ambulatory Visit | Attending: Family Medicine | Admitting: Family Medicine

## 2011-05-10 DIAGNOSIS — L219 Seborrheic dermatitis, unspecified: Secondary | ICD-10-CM

## 2011-06-29 ENCOUNTER — Ambulatory Visit (INDEPENDENT_AMBULATORY_CARE_PROVIDER_SITE_OTHER): Payer: Medicare Other | Admitting: Internal Medicine

## 2011-06-29 ENCOUNTER — Encounter: Payer: Self-pay | Admitting: Internal Medicine

## 2011-06-29 DIAGNOSIS — R509 Fever, unspecified: Secondary | ICD-10-CM

## 2011-06-29 DIAGNOSIS — J029 Acute pharyngitis, unspecified: Secondary | ICD-10-CM

## 2011-06-29 MED ORDER — AMOXICILLIN 500 MG PO CAPS: 500.0000 mg | ORAL_CAPSULE | Freq: Three times a day (TID) | ORAL | Status: AC

## 2011-06-29 NOTE — Assessment & Plan Note (Signed)
Start amoxil 

## 2011-06-29 NOTE — Patient Instructions (Signed)
Strep Throat     Strep throat is an infection of the throat caused by a bacteria named Streptococcus pyogenes. Your caregiver may call the infection streptococcal "tonsillitis" or "pharyngitis" depending on whether there are signs of inflammation in the tonsils or back of the throat. Strep throat is most common in children from 5 to 75 years old during the cold months of the year, but it can occur in people of any age during any season. This infection is spread from person to person (contagious) through coughing, sneezing, or other close contact.  SYMPTOMS   · Fever or chills.   · Painful, swollen, red tonsils or throat.   · Pain or difficulty when swallowing.   · White or yellow spots on the tonsils or throat.   · Swollen, tender lymph nodes or "glands" of the neck or under the jaw.   · Red rash all over the body (rare).   DIAGNOSIS   Many different infections can cause the same symptoms. A test must be done to confirm the diagnosis so the right treatment can be given. A "rapid strep test" can help your caregiver make the diagnosis in a few minutes. If this test is not available, a light swab of the infected area can be used for a throat culture test. If a throat culture test is done, results are usually available in a day or two.  TREATMENT   Strep throat is treated with antibiotic medicine.  HOME CARE INSTRUCTIONS   · Gargle with 1 tsp of salt in 1 cup of warm water, 3 to 4 times per day or as needed for comfort.   · Family members who also have a sore throat or fever should be tested for strep throat and treated with antibiotics if they have the strep infection.   · Make sure everyone in your household washes their hands well.   · Do not share food, drinking cups, or personal items that could cause the infection to spread to others.   · You may need to eat a soft food diet until your sore throat gets better.   · Drink enough water and fluids to keep your urine clear or pale yellow. This will help prevent  dehydration.   · Get plenty of rest.   · Stay home from school, daycare, or work until you have been on antibiotics for 24 hours.   · Only take over-the-counter or prescription medicines for pain, discomfort, or fever as directed by your caregiver.   · If antibiotics are prescribed, take them as directed. Finish them even if you start to feel better.   SEEK MEDICAL CARE IF:   · The glands in your neck continue to enlarge.   · You develop a rash, cough, or earache.   · You cough up green, yellow-brown, or bloody sputum.   · You have pain or discomfort not controlled by medicines.   · Your problems seem to be getting worse rather than better.   SEEK IMMEDIATE MEDICAL CARE IF:   · You develop any new symptoms such as vomiting, severe headache, stiff or painful neck, chest pain, shortness of breath, or trouble swallowing.   · You develop severe throat pain, drooling, or changes in your voice.   · You develop swelling of the neck, or the skin on the neck becomes red and tender.   · You have a fever.   · You develop signs of dehydration, such as fatigue, dry mouth, and decreased urination.   · 

## 2011-06-29 NOTE — Assessment & Plan Note (Addendum)
All the s/s are c/w strep infection so I decided to treat with testing, start amoxil

## 2011-06-29 NOTE — Progress Notes (Signed)
Subjective:    Patient ID: Theresa Arnold, female    DOB: [birth year], 77 y.o.   MRN: [id number]  Sore Throat  This is a new problem. Episode onset: for 3 days. The problem has been gradually worsening. Neither side of throat is experiencing more pain than the other. The maximum temperature recorded prior to her arrival was 100 - 100.9 F. The fever has been present for 1 to 2 days. The pain is at a severity of 1/10. The pain is mild. Associated symptoms include headaches and swollen glands. Pertinent negatives include no abdominal pain, congestion, coughing, diarrhea, drooling, ear discharge, ear pain, hoarse voice, plugged ear sensation, neck pain, shortness of breath, stridor, trouble swallowing or vomiting. She has had exposure to strep. She has had no exposure to mono. She has tried acetaminophen for the symptoms. The treatment provided no relief.      Review of Systems  Constitutional: Positive for fatigue. Negative for fever, chills, diaphoresis, activity change, appetite change and unexpected weight change.  HENT: Negative for ear pain, congestion, hoarse voice, drooling, trouble swallowing, neck pain and ear discharge.   Eyes: Negative.   Respiratory: Negative for cough, chest tightness, shortness of breath and stridor.   Cardiovascular: Negative for chest pain, palpitations and leg swelling.  Gastrointestinal: Negative for nausea, vomiting, abdominal pain and diarrhea.  Genitourinary: Negative for dysuria, urgency, frequency, hematuria, flank pain, decreased urine volume, enuresis, difficulty urinating and dyspareunia.  Musculoskeletal: Positive for myalgias. Negative for back pain, joint swelling, arthralgias and gait problem.  Skin: Negative for color change, pallor, rash and wound.  Neurological: Positive for headaches. Negative for dizziness, tremors, seizures, syncope, facial asymmetry, speech difficulty, weakness, light-headedness and numbness.  Hematological: Negative for  adenopathy. Does not bruise/bleed easily.  Psychiatric/Behavioral: Negative.        Objective:   Physical Exam  Constitutional: She is oriented to person, place, and time. She appears well-developed and well-nourished. No distress.  HENT:  Head: No trismus in the jaw.  Right Ear: Hearing, tympanic membrane, external ear and ear canal normal.  Left Ear: Hearing, tympanic membrane, external ear and ear canal normal.  Nose: No mucosal edema, rhinorrhea or sinus tenderness. Right sinus exhibits no maxillary sinus tenderness and no frontal sinus tenderness. Left sinus exhibits no maxillary sinus tenderness and no frontal sinus tenderness.  Mouth/Throat: Mucous membranes are normal. Mucous membranes are not pale, not dry and not cyanotic. No oral lesions. No uvula swelling. Posterior oropharyngeal erythema present. No oropharyngeal exudate, posterior oropharyngeal edema or tonsillar abscesses.  Eyes: Conjunctivae are normal. Right eye exhibits no discharge. Left eye exhibits no discharge. No scleral icterus.  Neck: Normal range of motion. Neck supple. No JVD present. No tracheal deviation present.  Cardiovascular: Normal rate, regular rhythm, normal heart sounds and intact distal pulses.  Exam reveals no gallop and no friction rub.   No murmur heard. Pulmonary/Chest: Effort normal and breath sounds normal. No stridor. No respiratory distress. She has no wheezes. She has no rales. She exhibits no tenderness.  Abdominal: Soft. Bowel sounds are normal. She exhibits no distension and no mass. There is no tenderness. There is no rebound and no guarding.  Musculoskeletal: Normal range of motion. She exhibits no edema and no tenderness.  Lymphadenopathy:    She has no cervical adenopathy.  Neurological: She is oriented to person, place, and time.  Skin: Skin is warm and dry. No rash noted. She is not diaphoretic. No erythema. No pallor.  Psychiatric: She has a normal  mood and affect. Her behavior is  normal. Judgment and thought content normal.          Assessment & Plan:

## 2011-11-10 ENCOUNTER — Other Ambulatory Visit (INDEPENDENT_AMBULATORY_CARE_PROVIDER_SITE_OTHER): Payer: Medicare Other

## 2011-11-10 ENCOUNTER — Encounter: Payer: Self-pay | Admitting: Internal Medicine

## 2011-11-10 ENCOUNTER — Ambulatory Visit (INDEPENDENT_AMBULATORY_CARE_PROVIDER_SITE_OTHER)
Admission: RE | Admit: 2011-11-10 | Discharge: 2011-11-10 | Disposition: A | Discharge status: Home / Self Care | Payer: Medicare Other | Source: Ambulatory Visit | Attending: Internal Medicine | Admitting: Internal Medicine

## 2011-11-10 ENCOUNTER — Ambulatory Visit (INDEPENDENT_AMBULATORY_CARE_PROVIDER_SITE_OTHER): Payer: Medicare Other | Admitting: Internal Medicine

## 2011-11-10 VITALS — BP 112/70 | HR 60 | Temp 97.5°F | Ht 62.5 in | Wt 126.5 lb

## 2011-11-10 DIAGNOSIS — R5381 Other malaise: Secondary | ICD-10-CM

## 2011-11-10 DIAGNOSIS — M25562 Pain in left knee: Secondary | ICD-10-CM

## 2011-11-10 DIAGNOSIS — M79602 Pain in left arm: Secondary | ICD-10-CM | POA: Insufficient documentation

## 2011-11-10 DIAGNOSIS — E785 Hyperlipidemia, unspecified: Secondary | ICD-10-CM | POA: Diagnosis not present

## 2011-11-10 DIAGNOSIS — R5383 Other fatigue: Secondary | ICD-10-CM

## 2011-11-10 DIAGNOSIS — Z0000 Encounter for general adult medical examination without abnormal findings: Secondary | ICD-10-CM

## 2011-11-10 DIAGNOSIS — R42 Dizziness and giddiness: Secondary | ICD-10-CM | POA: Diagnosis not present

## 2011-11-10 DIAGNOSIS — R531 Weakness: Secondary | ICD-10-CM

## 2011-11-10 DIAGNOSIS — J449 Chronic obstructive pulmonary disease, unspecified: Secondary | ICD-10-CM | POA: Diagnosis not present

## 2011-11-10 DIAGNOSIS — M47812 Spondylosis without myelopathy or radiculopathy, cervical region: Secondary | ICD-10-CM | POA: Diagnosis not present

## 2011-11-10 DIAGNOSIS — M25569 Pain in unspecified knee: Secondary | ICD-10-CM

## 2011-11-10 DIAGNOSIS — M79609 Pain in unspecified limb: Secondary | ICD-10-CM | POA: Diagnosis not present

## 2011-11-10 DIAGNOSIS — R0600 Dyspnea, unspecified: Secondary | ICD-10-CM

## 2011-11-10 DIAGNOSIS — M1710 Unilateral primary osteoarthritis, unspecified knee: Secondary | ICD-10-CM | POA: Insufficient documentation

## 2011-11-10 DIAGNOSIS — R0989 Other specified symptoms and signs involving the circulatory and respiratory systems: Secondary | ICD-10-CM

## 2011-11-10 HISTORY — DX: Spondylosis without myelopathy or radiculopathy, cervical region: M47.812

## 2011-11-10 HISTORY — DX: Chronic obstructive pulmonary disease, unspecified: J44.9

## 2011-11-10 LAB — URINALYSIS, ROUTINE W REFLEX MICROSCOPIC
Hgb urine dipstick: NEGATIVE
Urine Glucose: NEGATIVE
Urobilinogen, UA: 0.2 (ref 0.0–1.0)

## 2011-11-10 LAB — CBC WITH DIFFERENTIAL/PLATELET
Basophils Absolute: 0.1 10*3/uL (ref 0.0–0.1)
Eosinophils Absolute: 0.1 10*3/uL (ref 0.0–0.7)
Hemoglobin: 13.6 g/dL (ref 12.0–15.0)
Lymphocytes Relative: 37.6 % (ref 12.0–46.0)
MCHC: 32.5 g/dL (ref 30.0–36.0)
Neutro Abs: 4.1 10*3/uL (ref 1.4–7.7)
Neutrophils Relative %: 53.1 % (ref 43.0–77.0)
Platelets: 170 10*3/uL (ref 150.0–400.0)
RDW: 15.1 % — ABNORMAL HIGH (ref 11.5–14.6)

## 2011-11-10 LAB — HEPATIC FUNCTION PANEL
ALT: 10 U/L (ref 0–35)
AST: 26 U/L (ref 0–37)
Alkaline Phosphatase: 56 U/L (ref 39–117)
Bilirubin, Direct: 0.1 mg/dL (ref 0.0–0.3)
Total Protein: 7.5 g/dL (ref 6.0–8.3)

## 2011-11-10 LAB — BASIC METABOLIC PANEL
CO2: 29 mEq/L (ref 19–32)
Chloride: 107 mEq/L (ref 96–112)
Sodium: 144 mEq/L (ref 135–145)

## 2011-11-10 LAB — LIPID PANEL: Total CHOL/HDL Ratio: 3

## 2011-11-10 MED ORDER — ATORVASTATIN CALCIUM 10 MG PO TABS: 10.0000 mg | ORAL_TABLET | Freq: Every day | ORAL | Status: DC

## 2011-11-10 NOTE — Assessment & Plan Note (Signed)
Pt unfort has been out of lipitor for close to 2 wks, to re-start, check lipdids, goal ldl < 100

## 2011-11-10 NOTE — Assessment & Plan Note (Signed)
?   Related to cervical spine - for ortho eval as well as for neck and knee pain

## 2011-11-10 NOTE — Progress Notes (Signed)
Subjective:    Patient ID: Theresa Arnold, female    DOB: 13-Aug-1931, 78 y.o.   MRN: [id number]  HPI  Here with husband, c/o several spells lasting 2 hr about 3 times in the past 2 wks, the worst being yesterday mod to severe with general weakness, sob and mild dizziness/head pressure.   Pt denies fever, wt loss, night sweats, loss of appetite, or other constitutional symptoms  Pt denies chest pain, increased sob or doe, wheezing, orthopnea, PND, increased LE swelling, palpitations, dizziness or syncope except for the above.  Pt denies new neurological symptoms such as new headache, or facial or extremity weakness or numbness except for the above.   Pt denies polydipsia, polyuria.  Needs refill lipitor, has been out for several wks.  Also incidentally with 2-3 wks worsening left knee pain and swelling with a sense of instability and near  Giveaway but no falls or trauma.   Past Medical History  Diagnosis Date  . HYPERLIPIDEMIA 09/03/2010  . CERVICAL RADICULOPATHY, LEFT 09/03/2010  . Cervical spondylosis 11/10/2011  . COPD (chronic obstructive pulmonary disease) 11/10/2011   Past Surgical History  Procedure Date  . Colonoscopy     reports that she has been smoking.  She has never used smokeless tobacco. She reports that she does not drink alcohol or use illicit drugs. family history includes Breast cancer in her sister; Colon cancer in her sister; Heart disease in her father; and Hypertension in her other. No Known Allergies Current Outpatient Prescriptions on File Prior to Visit  Medication Sig Dispense Refill  . aspirin 325 MG EC tablet Take 325 mg by mouth daily.        . diphenhydrAMINE (BENADRYL) 25 mg capsule Take 25 mg by mouth every 6 (six) hours as needed.        Marland Kitchen DISCONTD: atorvastatin (LIPITOR) 10 MG tablet Take 10 mg by mouth daily.         Current Facility-Administered Medications on File Prior to Visit  Medication Dose Route Frequency Provider Last Rate Last Dose  . DISCONTD:  0.9 %  sodium chloride infusion  500 mL Intravenous Continuous Louis Meckel, MD      . DISCONTD: 0.9 %  sodium chloride infusion  500 mL Intravenous Continuous Louis Meckel, MD       Review of Systems Review of Systems  Constitutional: Negative for diaphoresis and unexpected weight change.  HENT: Negative for drooling and tinnitus.   Eyes: Negative for photophobia and visual disturbance.  Respiratory: Negative for choking and stridor.   Gastrointestinal: Negative for vomiting and blood in stool.  Genitourinary: Negative for hematuria and decreased urine volume.  Musculoskeletal: Negative for gait problem.  Skin: Negative for color change and wound.  Neurological: Negative for tremors and numbness.  Psychiatric/Behavioral: Negative for decreased concentration. The patient is not hyperactive.      Objective:   Physical Exam BP 112/70  Pulse 60  Temp(Src) 97.5 F (36.4 C) (Oral)  Ht 5' 2.5" (1.588 m)  Wt 126 lb 8 oz (57.38 kg)  BMI 22.77 kg/m2  SpO2 96% Physical Exam  VS noted Constitutional: Pt appears well-developed and well-nourished.  HENT: Head: Normocephalic.  Right Ear: External ear normal.  Left Ear: External ear normal.  Eyes: Conjunctivae and EOM are normal. Pupils are equal, round, and reactive to light.  Neck: Normal range of motion. Neck supple.  Cardiovascular: Normal rate and regular rhythm.   Pulmonary/Chest: Effort normal and breath sounds decreased bilat, no rales or  wheezes Abd:  Soft, NT, non-distended, + BS Neurological: Pt is alert. No cranial nerve deficit. motor/dtr/gait intact Skin: Skin is warm. No erythema. No rash Psychiatric: Pt behavior is normal. Thought content normal.1+ nervous     Assessment & Plan:

## 2011-11-10 NOTE — Assessment & Plan Note (Addendum)
And vague head pressure/dizziness - for ecg today -ECG reviewed as per emr, also for labs  Note:  Time for pt hx, exam, review of record with pt, determination of diagnoses, and plan for further eval and in consultation with husband and pt is > 40 min

## 2011-11-10 NOTE — Assessment & Plan Note (Signed)
Unclear etiology, exam benign, for cxr

## 2011-11-10 NOTE — Patient Instructions (Addendum)
Your EKG was OK today You will be contacted regarding the referral for: orthopedic for the neck/arm/knee pain Continue all other medications as before; your lipitor was refilled today Please have the pharmacy call with any refills you may need. Please continue your efforts at being more active, low cholesterol diet, and weight control. Please go to LAB in the Basement for the blood and/or urine tests to be done today Please go to XRAY in the Basement for the x-ray test You will be contacted regarding the referral for: echocardiogram, and carotid dopplers You will be contacted by phone if any changes need to be made immediately.  Otherwise, you will receive a letter about your results with an explanation. Please return in 6 months, or sooner if needed

## 2011-11-10 NOTE — Assessment & Plan Note (Signed)
Unclear etiology, also for carotids and echo,  to f/u any worsening symptoms or concerns

## 2011-11-10 NOTE — Assessment & Plan Note (Signed)
Reviewed MRI with pt, for ortho eval

## 2011-11-10 NOTE — Assessment & Plan Note (Signed)
C/w marked flare of left knee DJD - for ortho eval

## 2011-11-11 ENCOUNTER — Encounter: Payer: Self-pay | Admitting: Internal Medicine

## 2011-11-11 LAB — LDL CHOLESTEROL, DIRECT: Direct LDL: 142.7 mg/dL

## 2011-11-12 ENCOUNTER — Other Ambulatory Visit: Payer: Self-pay | Admitting: *Deleted

## 2011-11-12 DIAGNOSIS — R42 Dizziness and giddiness: Secondary | ICD-10-CM

## 2011-11-18 ENCOUNTER — Other Ambulatory Visit (HOSPITAL_COMMUNITY): Payer: Self-pay | Admitting: Radiology

## 2011-11-18 DIAGNOSIS — R0600 Dyspnea, unspecified: Secondary | ICD-10-CM

## 2011-11-19 ENCOUNTER — Other Ambulatory Visit: Payer: Self-pay

## 2011-11-19 ENCOUNTER — Ambulatory Visit (HOSPITAL_COMMUNITY): Payer: Medicare Other | Attending: Cardiology

## 2011-11-19 ENCOUNTER — Encounter: Payer: Self-pay | Admitting: Internal Medicine

## 2011-11-19 DIAGNOSIS — R0989 Other specified symptoms and signs involving the circulatory and respiratory systems: Secondary | ICD-10-CM | POA: Insufficient documentation

## 2011-11-19 DIAGNOSIS — R0609 Other forms of dyspnea: Secondary | ICD-10-CM | POA: Insufficient documentation

## 2011-11-19 DIAGNOSIS — R5381 Other malaise: Secondary | ICD-10-CM | POA: Diagnosis not present

## 2011-11-19 DIAGNOSIS — E785 Hyperlipidemia, unspecified: Secondary | ICD-10-CM | POA: Diagnosis not present

## 2011-11-19 DIAGNOSIS — R0600 Dyspnea, unspecified: Secondary | ICD-10-CM

## 2011-11-20 ENCOUNTER — Encounter (INDEPENDENT_AMBULATORY_CARE_PROVIDER_SITE_OTHER): Payer: Medicare Other

## 2011-11-20 DIAGNOSIS — R42 Dizziness and giddiness: Secondary | ICD-10-CM

## 2011-11-20 DIAGNOSIS — I6529 Occlusion and stenosis of unspecified carotid artery: Secondary | ICD-10-CM

## 2011-11-23 ENCOUNTER — Encounter: Payer: Self-pay | Admitting: Internal Medicine

## 2011-11-27 DIAGNOSIS — M25559 Pain in unspecified hip: Secondary | ICD-10-CM | POA: Diagnosis not present

## 2011-11-27 DIAGNOSIS — M1710 Unilateral primary osteoarthritis, unspecified knee: Secondary | ICD-10-CM | POA: Diagnosis not present

## 2011-11-30 ENCOUNTER — Ambulatory Visit (INDEPENDENT_AMBULATORY_CARE_PROVIDER_SITE_OTHER): Payer: Medicare Other | Admitting: Internal Medicine

## 2011-11-30 DIAGNOSIS — J449 Chronic obstructive pulmonary disease, unspecified: Secondary | ICD-10-CM

## 2011-11-30 LAB — PULMONARY FUNCTION TEST

## 2011-11-30 NOTE — Progress Notes (Signed)
PFT done today. 

## 2011-12-02 DIAGNOSIS — L708 Other acne: Secondary | ICD-10-CM | POA: Diagnosis not present

## 2011-12-02 DIAGNOSIS — L219 Seborrheic dermatitis, unspecified: Secondary | ICD-10-CM | POA: Diagnosis not present

## 2011-12-10 ENCOUNTER — Other Ambulatory Visit: Payer: Self-pay | Admitting: Internal Medicine

## 2011-12-10 DIAGNOSIS — Z1231 Encounter for screening mammogram for malignant neoplasm of breast: Secondary | ICD-10-CM

## 2011-12-22 ENCOUNTER — Ambulatory Visit
Admission: RE | Admit: 2011-12-22 | Discharge: 2011-12-22 | Disposition: A | Discharge status: Home / Self Care | Payer: Medicare Other | Source: Ambulatory Visit | Attending: Internal Medicine | Admitting: Internal Medicine

## 2011-12-22 DIAGNOSIS — Z1231 Encounter for screening mammogram for malignant neoplasm of breast: Secondary | ICD-10-CM | POA: Diagnosis not present

## 2011-12-24 LAB — HM MAMMOGRAPHY

## 2012-05-10 ENCOUNTER — Ambulatory Visit: Payer: Medicare Other | Admitting: Internal Medicine

## 2012-05-19 DIAGNOSIS — L988 Other specified disorders of the skin and subcutaneous tissue: Secondary | ICD-10-CM | POA: Diagnosis not present

## 2012-05-19 DIAGNOSIS — L219 Seborrheic dermatitis, unspecified: Secondary | ICD-10-CM | POA: Diagnosis not present

## 2013-03-21 DIAGNOSIS — L219 Seborrheic dermatitis, unspecified: Secondary | ICD-10-CM | POA: Diagnosis not present

## 2013-06-28 ENCOUNTER — Ambulatory Visit (INDEPENDENT_AMBULATORY_CARE_PROVIDER_SITE_OTHER): Payer: Medicare Other | Admitting: Family Medicine

## 2013-06-28 ENCOUNTER — Other Ambulatory Visit (INDEPENDENT_AMBULATORY_CARE_PROVIDER_SITE_OTHER): Payer: Medicare Other

## 2013-06-28 ENCOUNTER — Encounter: Payer: Self-pay | Admitting: Family Medicine

## 2013-06-28 VITALS — BP 134/82 | HR 72 | Wt 138.0 lb

## 2013-06-28 DIAGNOSIS — M25579 Pain in unspecified ankle and joints of unspecified foot: Secondary | ICD-10-CM | POA: Diagnosis not present

## 2013-06-28 DIAGNOSIS — S93409A Sprain of unspecified ligament of unspecified ankle, initial encounter: Secondary | ICD-10-CM | POA: Diagnosis not present

## 2013-06-28 DIAGNOSIS — M25572 Pain in left ankle and joints of left foot: Secondary | ICD-10-CM

## 2013-06-28 NOTE — Progress Notes (Signed)
  CC: Ankle pain and swelling  HPI: Patient is a 79 year old female who was recently putting presents underneath the Christmas tree and rolled her ankle. Patient states that the pain was not so bad but she continued to have swelling of his left ankle. Patient would like it to be evaluated. Patient is able to ambulate without any significant discomfort but continues to have swelling on the side. Denies any worsening of the swelling. Denies any numbness or tingling. Patient also denies any significant bruising. She has been trying to use knee-high stockings with no significant improvement. Patient is doing some icing which seems to be somewhat helpful. Patient rates the severity approximately 2/10   Past medical, surgical, family and social history reviewed. Medications reviewed all in the electronic medical record.   Review of Systems: No headache, visual changes, nausea, vomiting, diarrhea, constipation, dizziness, abdominal pain, skin rash, fevers, chills, night sweats, weight loss, swollen lymph nodes, body aches, joint swelling, muscle aches, chest pain, shortness of breath, mood changes.   Objective:    Blood pressure 134/82, pulse 72, weight 138 lb (62.596 kg), SpO2 91.00%.   General: No apparent distress alert and oriented x3 mood and affect normal, dressed appropriately.  HEENT: Pupils equal, extraocular movements intact Respiratory: Patient's speak in full sentences and does not appear short of breath Cardiovascular: No lower extremity edema, non tender, no erythema Skin: Warm dry intact with no signs of infection or rash on extremities or on axial skeleton. Abdomen: Soft nontender Neuro: Cranial nerves II through XII are intact, neurovascularly intact in all extremities with 2+ DTRs and 2+ pulses. Lymph: No lymphadenopathy of posterior or anterior cervical chain or axillae bilaterally.  Gait normal with good balance and coordination.  MSK: Non tender with full range of motion and good  stability and symmetric strength and tone of shoulders, elbows, wrist, hip, knee and  bilaterally.  Ankle: Left Patient does have some mild swelling and potential effusion on the lateral aspect of the ankle Range of motion is full in all directions. Strength is 5/5 in all directions. Stable lateral and medial ligaments; squeeze test and kleiger test unremarkable; Talar dome nontender; No pain at base of 5th MT; No tenderness over cuboid; No tenderness over N spot or navicular prominence No tenderness on posterior aspects of lateral and medial malleolus No sign of peroneal tendon subluxations or tenderness to palpation Negative tarsal tunnel tinel's Able to walk 4 steps. Patient does have some mild pain to palpation over the ATFL  MSK US performed of: Left ankle This study was ordered, performed, and interpreted by Terrilee Files D.O.  Foot/Ankle:   All structures visualized.   Talar dome unremarkable  Ankle mortise with trace effusion. Peroneus longus and brevis tendons unremarkable on long and transverse views without sheath effusions. Posterior tibialis, flexor hallucis longus, and flexor digitorum longus tendons unremarkable on long and transverse views without sheath effusions. Achilles tendon visualized along length of tendon and unremarkable on long and transverse views without sheath effusion. Anterior Talofibular Ligament is intact the significant hypoechoic changes around the area. and Calcaneofibular Ligaments unremarkable and intact. Deltoid Ligament unremarkable and intact. Plantar fascia intact and without effusion, normal thickness. No increased doppler signal, cap sign, or thickening of tibial cortex. Power doppler signal normal.  IMPRESSION:  Grade 1 ankle sprain    Impression and Recommendations:     This case required medical decision making of moderate complexity.

## 2013-06-28 NOTE — Progress Notes (Signed)
Pre-visit discussion using our clinic review tool. No additional management support is needed unless otherwise documented below in the visit note.  

## 2013-06-28 NOTE — Assessment & Plan Note (Signed)
Patient does have lateral ankle sprain with some mild swelling. There is no fracture noted on ultrasound today. Further imaging is not necessary. Patient given home exercise program as well as a compression wrap the she'll wear a regular basis. Patient will come back again in 3 weeks if not completely resolved. I am optimistic that she will do very well.

## 2013-06-28 NOTE — Patient Instructions (Signed)
Great to meet you You have a grade `1 ankle sprain Try exercises most days of the week.  Tylenol for discomfort Ice 20 minutes 2 times a day Wear the compression during the day but take off at night.  Come back in 3 weeks if not gone.

## 2013-08-03 DIAGNOSIS — L259 Unspecified contact dermatitis, unspecified cause: Secondary | ICD-10-CM | POA: Diagnosis not present

## 2013-08-03 DIAGNOSIS — L219 Seborrheic dermatitis, unspecified: Secondary | ICD-10-CM | POA: Diagnosis not present

## 2013-08-16 ENCOUNTER — Ambulatory Visit (INDEPENDENT_AMBULATORY_CARE_PROVIDER_SITE_OTHER): Payer: Medicare Other | Admitting: Internal Medicine

## 2013-08-16 ENCOUNTER — Encounter: Payer: Self-pay | Admitting: Internal Medicine

## 2013-08-16 VITALS — BP 110/78 | HR 64 | Temp 97.9°F | Ht 62.0 in | Wt 138.5 lb

## 2013-08-16 DIAGNOSIS — J449 Chronic obstructive pulmonary disease, unspecified: Secondary | ICD-10-CM

## 2013-08-16 DIAGNOSIS — Z23 Encounter for immunization: Secondary | ICD-10-CM | POA: Diagnosis not present

## 2013-08-16 DIAGNOSIS — R109 Unspecified abdominal pain: Secondary | ICD-10-CM | POA: Diagnosis not present

## 2013-08-16 NOTE — Progress Notes (Signed)
Pre-visit discussion using our clinic review tool. No additional management support is needed unless otherwise documented below in the visit note.  

## 2013-08-16 NOTE — Progress Notes (Signed)
Subjective:    Patient ID: Theresa Arnold, female    DOB: [birth year], 80 y.o.   MRN: [id number]  HPI   Here to f/u with onset 3-4 wks GI c/o's, sometimes epigsastric, sometimes lower abd, crampy, usually mild to mod, occas severe, bent her over in bed one night last wk, assoc with n and regurgitation (not really vomiting) even small meals, often needs to have BM at same time as nausea.  No radiation to the back, no fever, no wt loss, denies constipation, no diarrheal, no hard stools, no blood or dark stools, lying down sometimes makes better.  Denies urinary symptoms such as dysuria, frequency, urgency, flank pain, hematuria or n/v, fever, chills.  No hx of renal stone.  Has had episodes of feeling of need for BM but only passes gas. On lipitor for long time, no prior problems, no hx of pancreatitis. Pt denies chest pain, increased sob or doe, wheezing, orthopnea, PND, increased LE swelling, palpitations, dizziness or syncope. Past Medical History  Diagnosis Date  . HYPERLIPIDEMIA 09/03/2010  . CERVICAL RADICULOPATHY, LEFT 09/03/2010  . Cervical spondylosis 11/10/2011  . COPD (chronic obstructive pulmonary disease) 11/10/2011   Past Surgical History  Procedure Laterality Date  . Colonoscopy      reports that she has been smoking.  She has never used smokeless tobacco. She reports that she does not drink alcohol or use illicit drugs. family history includes Breast cancer in her sister; Colon cancer in her sister; Heart disease in her father; Hypertension in her other. No Known Allergies Current Outpatient Prescriptions on File Prior to Visit  Medication Sig Dispense Refill  . aspirin 325 MG EC tablet Take 325 mg by mouth daily.        Marland Kitchen atorvastatin (LIPITOR) 10 MG tablet Take 1 tablet (10 mg total) by mouth daily.  90 tablet  3  . diphenhydrAMINE (BENADRYL) 25 mg capsule Take 25 mg by mouth every 6 (six) hours as needed.         No current facility-administered medications on file prior to  visit.   Review of Systems  Constitutional: Negative for unexpected weight change, or unusual diaphoresis  HENT: Negative for tinnitus.   Eyes: Negative for photophobia and visual disturbance.  Respiratory: Negative for choking and stridor.   Gastrointestinal: Negative for vomiting and blood in stool.  Genitourinary: Negative for hematuria and decreased urine volume.  Musculoskeletal: Negative for acute joint swelling Skin: Negative for color change and wound.  Neurological: Negative for tremors and numbness other than noted  Psychiatric/Behavioral: Negative for decreased concentration or  hyperactivity.       Objective:   Physical Exam BP 110/78  Pulse 64  Temp(Src) 97.9 F (36.6 C) (Oral)  Ht 5\' 2"  (1.575 m)  Wt 138 lb 8 oz (62.823 kg)  BMI 25.33 kg/m2  SpO2 93% VS noted,  Constitutional: Pt appears well-developed and well-nourished.  HENT: Head: NCAT.  Right Ear: External ear normal.  Left Ear: External ear normal.  Eyes: Conjunctivae and EOM are normal. Pupils are equal, round, and reactive to light.  Neck: Normal range of motion. Neck supple.  Cardiovascular: Normal rate and regular rhythm.   Pulmonary/Chest: Effort normal and breath sounds normal.  Abd:  Soft, NT, non-distended, + BS except for mid and left/llq mild to mod tender, no guarding/rebound, no flank tender Neurological: Pt is alert. Not confused  Skin: Skin is warm. No erythema.  Psychiatric: Pt behavior is normal. Thought content normal.  Assessment & Plan:

## 2013-08-16 NOTE — Assessment & Plan Note (Addendum)
Etiology unclear, afeb with signficant subjective pain, tender on exam,  - cant r/o pancreatitis - to d/c lipitor, and start empiric PPI trial with sample nexium for 5 days, for lab eval and abd film as ordered tomorrow am (lab and xray closed at this time), pending results will need to consider CT, also for GI referral (can be cancelled if pain resolves)

## 2013-08-16 NOTE — Patient Instructions (Addendum)
You had the new Prevnar pneumonia shot today  OK to stop the lipitor for now  OK to take the samples of OTC nexium at 2 pills per day; please call if this helps for a prescription for a similar medication  Please continue all other medications as before, and refills have been done if requested. Please have the pharmacy call with any other refills you may need.  Please go to the LAB in the Basement (turn left off the elevator) for the tests to be done tomorrow Please go to the XRAY Department in the Basement (go straight as you get off the elevator) for the x-ray testing - tomorrow  You will be contacted regarding the referral for: GI

## 2013-08-16 NOTE — Assessment & Plan Note (Signed)
stable overall by history and exam, recent data reviewed with pt, and pt to continue medical treatment as before,  to f/u any worsening symptoms or concerns SpO2 Readings from Last 3 Encounters:  08/16/13 93%  06/28/13 91%  11/10/11 96%

## 2013-08-17 ENCOUNTER — Other Ambulatory Visit (INDEPENDENT_AMBULATORY_CARE_PROVIDER_SITE_OTHER): Payer: Medicare Other

## 2013-08-17 ENCOUNTER — Ambulatory Visit (INDEPENDENT_AMBULATORY_CARE_PROVIDER_SITE_OTHER)
Admission: RE | Admit: 2013-08-17 | Discharge: 2013-08-17 | Disposition: A | Discharge status: Home / Self Care | Payer: Medicare Other | Source: Ambulatory Visit | Attending: Internal Medicine | Admitting: Internal Medicine

## 2013-08-17 ENCOUNTER — Encounter: Payer: Self-pay | Admitting: Internal Medicine

## 2013-08-17 ENCOUNTER — Telehealth: Payer: Self-pay | Admitting: Internal Medicine

## 2013-08-17 ENCOUNTER — Ambulatory Visit: Payer: Medicare Other | Admitting: Internal Medicine

## 2013-08-17 DIAGNOSIS — R109 Unspecified abdominal pain: Secondary | ICD-10-CM

## 2013-08-17 LAB — CBC WITH DIFFERENTIAL/PLATELET
BASOS PCT: 0.3 % (ref 0.0–3.0)
Basophils Absolute: 0 10*3/uL (ref 0.0–0.1)
EOS PCT: 1.6 % (ref 0.0–5.0)
Eosinophils Absolute: 0.1 10*3/uL (ref 0.0–0.7)
HCT: 41.4 % (ref 36.0–46.0)
Hemoglobin: 13.6 g/dL (ref 12.0–15.0)
LYMPHS ABS: 2.9 10*3/uL (ref 0.7–4.0)
Lymphocytes Relative: 32.7 % (ref 12.0–46.0)
MCHC: 32.8 g/dL (ref 30.0–36.0)
MCV: 104.3 fl — ABNORMAL HIGH (ref 78.0–100.0)
MONOS PCT: 6 % (ref 3.0–12.0)
Monocytes Absolute: 0.5 10*3/uL (ref 0.1–1.0)
Neutro Abs: 5.3 10*3/uL (ref 1.4–7.7)
Neutrophils Relative %: 59.4 % (ref 43.0–77.0)
PLATELETS: 182 10*3/uL (ref 150.0–400.0)
RBC: 3.97 Mil/uL (ref 3.87–5.11)
RDW: 14.1 % (ref 11.5–14.6)
WBC: 8.9 10*3/uL (ref 4.5–10.5)

## 2013-08-17 LAB — URINALYSIS, ROUTINE W REFLEX MICROSCOPIC
BILIRUBIN URINE: NEGATIVE
HGB URINE DIPSTICK: NEGATIVE
Ketones, ur: NEGATIVE
Leukocytes, UA: NEGATIVE
Nitrite: NEGATIVE
PH: 6 (ref 5.0–8.0)
Specific Gravity, Urine: 1.02 (ref 1.000–1.030)
TOTAL PROTEIN, URINE-UPE24: NEGATIVE
URINE GLUCOSE: NEGATIVE
Urobilinogen, UA: 0.2 (ref 0.0–1.0)

## 2013-08-17 LAB — SEDIMENTATION RATE: Sed Rate: 42 mm/hr — ABNORMAL HIGH (ref 0–22)

## 2013-08-17 LAB — H. PYLORI ANTIBODY, IGG: H Pylori IgG: NEGATIVE

## 2013-08-17 LAB — BASIC METABOLIC PANEL
BUN: 8 mg/dL (ref 6–23)
CALCIUM: 9.4 mg/dL (ref 8.4–10.5)
CO2: 27 mEq/L (ref 19–32)
Chloride: 108 mEq/L (ref 96–112)
Creatinine, Ser: 0.6 mg/dL (ref 0.4–1.2)
GFR: 128.9 mL/min (ref >60.00)
GLUCOSE: 82 mg/dL (ref 70–99)
Potassium: 4.2 mEq/L (ref 3.5–5.1)
Sodium: 142 mEq/L (ref 135–145)

## 2013-08-17 LAB — HEPATIC FUNCTION PANEL
ALBUMIN: 3.7 g/dL (ref 3.5–5.2)
ALT: 8 U/L (ref 0–35)
AST: 21 U/L (ref 0–37)
Alkaline Phosphatase: 57 U/L (ref 39–117)
BILIRUBIN TOTAL: 0.7 mg/dL (ref 0.3–1.2)
Bilirubin, Direct: 0 mg/dL (ref 0.0–0.3)
Total Protein: 7.7 g/dL (ref 6.0–8.3)

## 2013-08-17 LAB — LIPASE: Lipase: 28 U/L (ref 11.0–59.0)

## 2013-08-17 NOTE — Telephone Encounter (Signed)
Relevant patient education assigned to patient using Emmi. ° °

## 2013-08-18 ENCOUNTER — Telehealth: Payer: Self-pay | Admitting: *Deleted

## 2013-08-18 NOTE — Telephone Encounter (Signed)
Patient phoned triage line at [phone] requesting results for xrays and labs from 08/16/13 and also states she has continued abd pain.  Please advise.  CB# [phone]

## 2013-08-18 NOTE — Telephone Encounter (Signed)
Called the patient informed PCP has mailed a letter with labs/xrays results to her home.  Enclosed with results is a letter explaining results.  Did go over results with the patient per PCP instructions. Patient is still having abdominal pain and will wait for appointment with GI.

## 2013-08-25 ENCOUNTER — Ambulatory Visit (INDEPENDENT_AMBULATORY_CARE_PROVIDER_SITE_OTHER): Payer: Medicare Other | Admitting: Gastroenterology

## 2013-08-25 ENCOUNTER — Encounter: Payer: Self-pay | Admitting: Gastroenterology

## 2013-08-25 VITALS — BP 120/60 | HR 64 | Ht 62.0 in | Wt 139.0 lb

## 2013-08-25 DIAGNOSIS — R1032 Left lower quadrant pain: Secondary | ICD-10-CM | POA: Insufficient documentation

## 2013-08-25 MED ORDER — CIPROFLOXACIN HCL 500 MG PO TABS: 500.0000 mg | ORAL_TABLET | Freq: Two times a day (BID) | ORAL | Status: DC

## 2013-08-25 MED ORDER — METRONIDAZOLE 250 MG PO TABS: 250.0000 mg | ORAL_TABLET | Freq: Three times a day (TID) | ORAL | Status: DC

## 2013-08-25 NOTE — Assessment & Plan Note (Signed)
2 week history of intermittent but frequent left lower quadrant pain with change in bowel habits.  I suspect that she has a low-grade diverticulitis.  Vocal cords is prolonged for a viral gastroenteritis.  Recommendations #1 begin therapy with Cipro and Flagyl #2 if no improvement in 3-4 days I will obtain a CT of the abdomen and pelvis #3 low fiber diet until pain improves

## 2013-08-25 NOTE — Patient Instructions (Signed)
Follow up in 3 weeks Low-Fiber Diet Fiber is found in fruits, vegetables, and grains. A low-fiber diet restricts fibrous foods that are not digested in the small intestine. A diet containing about 10 grams of fiber is considered low fiber.  PURPOSE  To prevent blockage of a partially obstructed or narrowed gastrointestinal tract.  To reduce fecal weight and volume.  To slow the movement of feces. WHEN IS THIS DIET USED?  It may be used during the acute phase of Crohn disease, ulcerative colitis, regional enteritis, or diverticulitis.  It may be used if your intestinal or esophageal tubes are narrowing (stenosis).  It may be used as a transitional diet following surgery, injury (trauma), or illness. CHOOSING FOODS Check labels, especially on foods from the starch list. Often times, dietary fiber content is listed on the nutrition facts panel. Please ask your Registered Dietitian if you have questions about specific foods that are related to your condition, especially if the food is not listed on this handout. Breads and Starches  Allowed: White, [place], and pita breads, plain rolls, buns, or sweet rolls, doughnuts, waffles, pancakes, bagels. Plain muffins, biscuits, matzoth. Soda, saltine, graham crackers. Pretzels, rusks, melba toast, zwieback. Cooked cereals: cornmeal, farina, or cream cereals. Dry cereals: refined corn, wheat, rice, and oat cereals (check label). Potatoes prepared any way without skins, refined macaroni, spaghetti, noodles, refined rice.  Avoid: Whole-wheat bread, rolls, and crackers. Multigrains, rye, bran seeds, nuts, or coconut. Cereals containing whole grains, multigrains, bran, coconut, nuts, raisins. Cooked or dry oatmeal. Coarse wheat cereals, granola. Cereals advertised as "high fiber." Potato skins. Whole-grain pasta, wild or brown rice. Popcorn. Vegetables  Allowed: Strained tomato and vegetable juices. Fresh lettuce, cucumber, spinach. Well-cooked or canned:  asparagus, bean sprouts, broccoli, cut green beans, cauliflower, pumpkin, beets, mushrooms, yellow squash, tomato, tomato sauce, zucchini, turnips.Keep servings limited to  cup.  Avoid: Fresh, cooked, or canned: artichokes, baked beans, beet greens, Brussels sprouts, corn, kale, legumes, peas, sweet potatoes. Avoid large servings of any vegetables. Fruit  Allowed: All fruit juices except prune juice. Cooked or canned fruits without skin and seeds: apricots, applesauce, cantaloupe, cherries, grapefruit, grapes, kiwi, mandarin oranges, peaches, pears, fruit cocktail, pineapple, plums, watermelon. Fresh without skin: banana, grapes, cantaloupe, avocado, cherries, pineapple, kiwi, nectarines, peaches, blueberries. Keep servings limited to  cup or 1 piece.  Avoid: Fresh: apples with or without skin, apricots, mangoes, pears, raspberries, strawberries. Prune juice and juices with pulp, stewed or dried prunes. Dried fruits, raisins, dates. Avoid large servings of all fresh fruits. Meat and Protein Substitutes  Allowed: Ground or well-cooked tender beef, ham, veal, lamb, pork, poultry. Eggs, plain cheese. Fish, oysters, shrimp, lobster, other seafood. Liver, organ meats. Smooth nut butters.  Avoid: Tough, fibrous meats with gristle. Chunky nut butter.Cheese with seeds, nuts, or other foods not allowed. Nuts, seeds, legumes, dried peas, beans, lentils. Dairy  Allowed: All milk products except those not allowed.  Avoid: Yogurt or cheese that contains nuts, seeds, or added fruit. Soups and Combination Foods  Allowed: Bouillon, broth, or cream soups made from allowed foods. Any strained soup. Casseroles or mixed dishes made with allowed foods.  Avoid: Soups made from vegetables that are not allowed or that contain other foods not allowed. Desserts and Sweets  Allowed:Plain cakes and cookies, pie made with allowed fruit, pudding, custard, cream pie. Gelatin, fruit, ice, sherbet, frozen ice pops.  Ice cream, ice milk without nuts. Plain hard candy, honey, jelly, molasses, syrup, sugar, chocolate syrup, gumdrops, marshmallows.  Avoid: Desserts,  cookies, or candies that contain nuts, peanut butter, dried fruits. Jams, preserves with seeds, marmalade. Fats and Oils  Allowed:Margarine, butter, cream, mayonnaise, salad oils, plain salad dressings made from allowed foods.  Avoid: Seeds, nuts, olives. Beverages  Allowed: All, except those listed to avoid.  Avoid: Fruit juices with high pulp, prune juice. Condiments  Allowed:Ketchup, mustard, horseradish, vinegar, cream sauce, cheese sauce, cocoa powder. Spices in moderation: allspice, basil, bay leaves, celery powder or leaves, cinnamon, cumin powder, curry powder, ginger, mace, marjoram, onion or garlic powder, oregano, paprika, parsley flakes, ground pepper, rosemary, sage, savory, tarragon, thyme, turmeric.  Avoid: Coconut, pickles. SAMPLE MENU Breakfast   cup orange juice.  1 boiled egg.  1 slice white toast.  Margarine.   cup cornflakes.  1 cup milk.  Beverage. Lunch   cup chicken noodle soup.  2 to 3 oz sliced roast beef.  2 slices white bread.  Mayonnaise.   cup tomato juice.  1 small banana.  Beverage. Dinner  3 oz baked chicken.   cup scalloped potatoes.   cup cooked beets.  White dinner roll.  Margarine.   cup canned peaches.  Beverage. Document Released: 12/19/2001 Document Revised: 03/01/2013 Document Reviewed: 07/16/2011 [facility] Patient Information 2014 [place].

## 2013-08-25 NOTE — Progress Notes (Signed)
_                                                                                                                History of Present Illness: Pleasant 80 year old Afro-American female referred for evaluation of abdominal pain.  For the past 2 weeks she's been complaining of frequent lower abdominal pain.  It is poorly described but is moderately severe.  Pain often is followed by the need to move her bowels.  She  may or may not have a bowel movement.  Passing small amounts of stool or flatus often relieves the discomfort.  She has awakened with pain.  She may have pain without having to move her bowels.  She is now having at least 4-5 small-volume solid stools a day which is a change from her once daily bowel movements.  There is no history of melena, hematochezia, fever, myalgias or arthralgias.  She has known severe diverticular disease involving the sigmoid colon.  A hyperplastic polyp was removed in 2012.   Past Medical History  Diagnosis Date  . HYPERLIPIDEMIA 09/03/2010  . CERVICAL RADICULOPATHY, LEFT 09/03/2010  . Cervical spondylosis 11/10/2011  . COPD (chronic obstructive pulmonary disease) 11/10/2011   Past Surgical History  Procedure Laterality Date  . Colonoscopy     family history includes Breast cancer in her sister; Colon cancer in her sister; Heart disease in her father; Hypertension in her other. Current Outpatient Prescriptions  Medication Sig Dispense Refill  . aspirin 325 MG EC tablet Take 325 mg by mouth daily.        . diphenhydrAMINE (BENADRYL) 25 mg capsule Take 25 mg by mouth every 6 (six) hours as needed.         No current facility-administered medications for this visit.   Allergies as of 08/25/2013  . (No Known Allergies)    reports that she has quit smoking. She has never used smokeless tobacco. She reports that she does not drink alcohol or use illicit drugs.     Review of Systems: Pertinent positive and negative review of systems were  noted in the above HPI section. All other review of systems were otherwise negative.  Vital signs were reviewed in today's medical record Physical Exam: General: Well developed , well nourished, no acute distress Skin: anicteric Head: Normocephalic and atraumatic Eyes:  sclerae anicteric, EOMI Ears: Normal auditory acuity Mouth: No deformity or lesions Neck: Supple, no masses or thyromegaly Lungs: Clear throughout to auscultation Heart: Regular rate and rhythm; no rubs or bruits.  There is a 1/6 early systolic murmur Abdomen: Soft,  and non distended. No masses, hepatosplenomegaly or hernias noted. Normal Bowel sounds.  There is for mild tenderness in the left lower quadrant Rectal:deferred Musculoskeletal: Symmetrical with no gross deformities  Skin: No lesions on visible extremities Pulses:  Normal pulses noted Extremities: No clubbing, cyanosis, edema or deformities noted Neurological: Alert oriented x 4, grossly nonfocal Cervical Nodes:  No significant cervical adenopathy Inguinal Nodes: No significant inguinal adenopathy Psychological:  Alert and cooperative.  Normal mood and affect  See Assessment and Plan under Problem List

## 2013-09-03 ENCOUNTER — Encounter (HOSPITAL_COMMUNITY): Payer: Self-pay | Admitting: Emergency Medicine

## 2013-09-03 ENCOUNTER — Emergency Department (HOSPITAL_COMMUNITY)
Admission: EM | Admit: 2013-09-03 | Discharge: 2013-09-03 | Disposition: A | Discharge status: Home / Self Care | Payer: Medicare Other | Attending: Emergency Medicine | Admitting: Emergency Medicine

## 2013-09-03 DIAGNOSIS — Z862 Personal history of diseases of the blood and blood-forming organs and certain disorders involving the immune mechanism: Secondary | ICD-10-CM | POA: Insufficient documentation

## 2013-09-03 DIAGNOSIS — Z7982 Long term (current) use of aspirin: Secondary | ICD-10-CM | POA: Insufficient documentation

## 2013-09-03 DIAGNOSIS — Z8639 Personal history of other endocrine, nutritional and metabolic disease: Secondary | ICD-10-CM | POA: Diagnosis not present

## 2013-09-03 DIAGNOSIS — Z792 Long term (current) use of antibiotics: Secondary | ICD-10-CM | POA: Diagnosis not present

## 2013-09-03 DIAGNOSIS — J449 Chronic obstructive pulmonary disease, unspecified: Secondary | ICD-10-CM | POA: Diagnosis not present

## 2013-09-03 DIAGNOSIS — Z79899 Other long term (current) drug therapy: Secondary | ICD-10-CM | POA: Diagnosis not present

## 2013-09-03 DIAGNOSIS — J4489 Other specified chronic obstructive pulmonary disease: Secondary | ICD-10-CM | POA: Insufficient documentation

## 2013-09-03 DIAGNOSIS — Z87891 Personal history of nicotine dependence: Secondary | ICD-10-CM | POA: Insufficient documentation

## 2013-09-03 DIAGNOSIS — G500 Trigeminal neuralgia: Secondary | ICD-10-CM | POA: Diagnosis not present

## 2013-09-03 DIAGNOSIS — R51 Headache: Secondary | ICD-10-CM | POA: Diagnosis not present

## 2013-09-03 DIAGNOSIS — Z8739 Personal history of other diseases of the musculoskeletal system and connective tissue: Secondary | ICD-10-CM | POA: Insufficient documentation

## 2013-09-03 LAB — I-STAT CHEM 8, ED
BUN: 5 mg/dL — ABNORMAL LOW (ref 6–23)
CHLORIDE: 103 meq/L (ref 96–112)
Calcium, Ion: 1.18 mmol/L (ref 1.13–1.30)
Creatinine, Ser: 0.6 mg/dL (ref 0.50–1.10)
Glucose, Bld: 83 mg/dL (ref 70–99)
HCT: 48 % — ABNORMAL HIGH (ref 36.0–46.0)
Hemoglobin: 16.3 g/dL — ABNORMAL HIGH (ref 12.0–15.0)
Potassium: 3.4 mEq/L — ABNORMAL LOW (ref 3.7–5.3)
SODIUM: 144 meq/L (ref 137–147)
TCO2: 26 mmol/L (ref 0–100)

## 2013-09-03 LAB — CBG MONITORING, ED: GLUCOSE-CAPILLARY: 71 mg/dL (ref 70–99)

## 2013-09-03 MED ORDER — GABAPENTIN 100 MG PO CAPS
100.0000 mg | ORAL_CAPSULE | Freq: Once | ORAL | Status: AC
  Administered 2013-09-03: 100 mg via ORAL
  Filled 2013-09-03: qty 1

## 2013-09-03 MED ORDER — GABAPENTIN 100 MG PO CAPS: 100.0000 mg | ORAL_CAPSULE | Freq: Three times a day (TID) | ORAL | Status: DC

## 2013-09-03 NOTE — ED Provider Notes (Addendum)
CSN: [id number]     Arrival date & time 09/03/13  1828 History   First MD Initiated Contact with Patient 09/03/13 1911     Chief Complaint  Patient presents with  . Headache     (Consider location/radiation/quality/duration/timing/severity/associated sxs/prior Treatment) Patient is a 80 y.o. female presenting with headaches. The history is provided by the patient and the spouse.  Headache Associated symptoms: no abdominal pain, no back pain, no ear pain, no fever, no hearing loss, no nausea, no neck pain, no numbness and no vomiting   pt denies headache, but rather sharp, shooting, electrical, nerve like pain affecting left face/left side of head. Episodes come on suddenly at rest, last seconds. Has occurred several times since onset, always brief, sudden. No specific exacerbating or alleviating factors. Not related to moving head or neck. No relation to eating/chewing. Not related to touching face/head or ear. No ear pain, tinnitus or hearing loss. No eye pain or change in vision. No prolonged pain or pain inside her head, no headache. No neck pain or stiffness. No recent head trauma or injury. No recent febrile/viral illness. Denies tooth or mouth pain. States recent health at baseline. Normal appetite. Normal activity. No wt loss. Denies any other neuro symptoms, no numbness/weakness. No change gait, coordination, or functional ability.      Past Medical History  Diagnosis Date  . HYPERLIPIDEMIA 09/03/2010  . CERVICAL RADICULOPATHY, LEFT 09/03/2010  . Cervical spondylosis 11/10/2011  . COPD (chronic obstructive pulmonary disease) 11/10/2011   Past Surgical History  Procedure Laterality Date  . Colonoscopy     Family History  Problem Relation Age of Onset  . Hypertension Other   . Colon cancer Sister   . Breast cancer Sister   . Heart disease Father    History  Substance Use Topics  . Smoking status: Former Smoker -- 1.00 packs/day  . Smokeless tobacco: Never Used     Comment:  QUIT 2013   . Alcohol Use: No   OB History   Grav Para Term Preterm Abortions TAB SAB Ect Mult Living                 Review of Systems  Constitutional: Negative for fever and chills.  HENT: Negative for dental problem, ear pain, facial swelling, hearing loss, mouth sores and tinnitus.   Eyes: Negative for redness.  Respiratory: Negative for shortness of breath.   Cardiovascular: Negative for chest pain.  Gastrointestinal: Negative for nausea, vomiting and abdominal pain.  Genitourinary: Negative for flank pain.  Musculoskeletal: Negative for back pain and neck pain.  Skin: Negative for rash.  Neurological: Negative for weakness and numbness.  Hematological: Does not bruise/bleed easily.  Psychiatric/Behavioral: Negative for confusion.      Allergies  Review of patient's allergies indicates no known allergies.  Home Medications   Current Outpatient Rx  Name  Route  Sig  Dispense  Refill  . aspirin 325 MG EC tablet   Oral   Take 325 mg by mouth daily.           . diphenhydrAMINE (BENADRYL) 25 mg capsule   Oral   Take 25 mg by mouth every 6 (six) hours as needed.           . metroNIDAZOLE (FLAGYL) 250 MG tablet   Oral   Take 1 tablet (250 mg total) by mouth 3 (three) times daily.   30 tablet   0   . ciprofloxacin (CIPRO) 500 MG tablet   Oral  Take 1 tablet (500 mg total) by mouth 2 (two) times daily.   20 tablet   0    BP 145/73  Pulse 72  Temp(Src) 98.1 F (36.7 C) (Oral)  Resp 18  Ht 5\' 2"  (1.575 m)  Wt 138 lb 5 oz (62.738 kg)  BMI 25.29 kg/m2  SpO2 99% Physical Exam  Nursing note and vitals reviewed. Constitutional: She is oriented to person, place, and time. She appears well-developed and well-nourished. No distress.  HENT:  Nose: Nose normal.  Mouth/Throat: Oropharynx is clear and moist.  No facial or temporal tenderness.  No oral or dental lesions, tenderness, or pain.  No focal tmj tenderness.  eacs clear, tms normal. No mastoid  tenderness.   Eyes: Conjunctivae are normal. Pupils are equal, round, and reactive to light. No scleral icterus.  Neck: Neck supple. No tracheal deviation present.  Cardiovascular: Normal rate, regular rhythm, normal heart sounds and intact distal pulses.   Pulmonary/Chest: Effort normal and breath sounds normal. No respiratory distress.  Abdominal: Normal appearance. She exhibits no distension.  Musculoskeletal: She exhibits no edema.  Lymphadenopathy:    She has no cervical adenopathy.  Neurological: She is alert and oriented to person, place, and time. No cranial nerve deficit.  Motor intact bil. Steady gait.   Skin: Skin is warm and dry. No rash noted. She is not diaphoretic.  Psychiatric: She has a normal mood and affect.    ED Course  Procedures (including critical care time) Labs Review Labs Reviewed  CBG MONITORING, ED  I-STAT CHEM 8, ED   Imaging Review No results found.  EKG Interpretation   None       MDM  pts symptoms c/w trigeminal neuralgia.   Discussed pt with neurology on call, Dr Nicole Kindred - he indicates would place on neurontin 100 mg tid, would not place on tegretol, and recommended outpatient mri and outpatient referral to neurology.  Discussed above plan w pt/spouse  -  Agreeable.  neurontin 100 mg po.      Mirna Mires, MD 09/03/13 [phone]

## 2013-09-03 NOTE — ED Notes (Signed)
Pt reports left sided head pain onset 1 to 1 1/2 hours ago. Pt denies change in vision, voice clear, no facial droop, equal grips, no arm drift.

## 2013-09-03 NOTE — Discharge Instructions (Signed)
We discussed your case with our neurologist.  He indicates for you to take neurontin as prescribed, follow up this week for outpatient MRI, and follow up with our neurologist in the next 1-2 weeks in their office - see referral - call tomorrow to arrange appointment.  Take neurontin as prescribed. You may also take motrin or aleve as need.  Return to ER if worse, new symptoms, fevers, numbness/weakness, other concern.      Trigeminal Neuralgia Trigeminal neuralgia is a nerve disorder that causes sudden attacks of severe facial pain. It is caused by damage to the trigeminal nerve, a major nerve in the face. It is more common in women and in the elderly, although it can also happen in younger patients. Attacks last from a few seconds to several minutes and can occur from a couple of times per year to several times per day. Trigeminal neuralgia can be a very distressing and disabling condition. Surgery may be needed in very severe cases if medical treatment does not give relief. HOME CARE INSTRUCTIONS   If your caregiver prescribed medication to help prevent attacks, take as directed.  To help prevent attacks:  Chew on the unaffected side of the mouth.  Avoid touching your face.  Avoid blasts of hot or cold air.  Men may wish to grow a beard to avoid having to shave. SEEK IMMEDIATE MEDICAL CARE IF:  Pain is unbearable and your medicine does not help.  You develop new, unexplained symptoms (problems).  You have problems that may be related to a medication you are taking. Document Released: 06/26/2000 Document Revised: 09/21/2011 Document Reviewed: 04/26/2009 [facility] Patient Information 2014 [place], [place].

## 2013-09-03 NOTE — ED Notes (Signed)
Patient C/O having a sharp pain on the left side of her head/face that extends to her left mastoid area.   Pain is intermittent and began about 1.5 hours ago.  Denies visual disturbances but she states that it makes her left eye feel sleepy.  Denies weakness, numbness or tingling

## 2013-09-06 ENCOUNTER — Ambulatory Visit (HOSPITAL_COMMUNITY)
Admission: RE | Admit: 2013-09-06 | Discharge: 2013-09-06 | Disposition: A | Discharge status: Home / Self Care | Payer: Medicare Other | Source: Ambulatory Visit | Attending: Emergency Medicine | Admitting: Emergency Medicine

## 2013-09-06 DIAGNOSIS — R51 Headache: Secondary | ICD-10-CM | POA: Insufficient documentation

## 2013-09-06 DIAGNOSIS — J3489 Other specified disorders of nose and nasal sinuses: Secondary | ICD-10-CM | POA: Insufficient documentation

## 2013-09-08 ENCOUNTER — Ambulatory Visit: Payer: BC Managed Care – PPO | Admitting: Diagnostic Neuroimaging

## 2013-09-13 ENCOUNTER — Encounter: Payer: Self-pay | Admitting: Diagnostic Neuroimaging

## 2013-09-13 ENCOUNTER — Ambulatory Visit (INDEPENDENT_AMBULATORY_CARE_PROVIDER_SITE_OTHER): Payer: Medicare Other | Admitting: Diagnostic Neuroimaging

## 2013-09-13 VITALS — BP 116/70 | HR 64 | Temp 98.3°F | Ht 62.0 in | Wt 138.0 lb

## 2013-09-13 DIAGNOSIS — M531 Cervicobrachial syndrome: Secondary | ICD-10-CM | POA: Diagnosis not present

## 2013-09-13 DIAGNOSIS — M5481 Occipital neuralgia: Secondary | ICD-10-CM

## 2013-09-13 MED ORDER — GABAPENTIN 300 MG PO CAPS: 300.0000 mg | ORAL_CAPSULE | Freq: Three times a day (TID) | ORAL | Status: DC

## 2013-09-13 NOTE — Patient Instructions (Signed)
Gradually increase gabapentin up to 300mg  (three times per day).

## 2013-09-13 NOTE — Progress Notes (Signed)
[facility]  PATIENT: Theresa Arnold DOB: [birth year]  REFERRING CLINICIAN: ER / Ashok Cordia HISTORY FROM: patient  REASON FOR VISIT: new consult   HISTORICAL  CHIEF COMPLAINT:  Chief Complaint  Patient presents with  . Headache    HISTORY OF PRESENT ILLNESS:  80 year old right-handed female with history of hypercholesteremia, former cigarette smoker, here for evaluation of left head pain episodes.  2-3 weeks ago patient had onset of left frontal, temporal and parietal sharp flashes of pain lasting for one second at a time. These increased in frequency up to every minute. Patient went to the emergency room for evaluation. Patient was diagnosed with possible trigeminal neuralgia and discharged on gabapentin 100 mg 3 times a day.  Patient had no vision changes, headache, numbness, extremity weakness or numbness. No prodromal infections, stress, sleep problems.  Patient is a former 30-pack-year smoker, quit in 2013. She also previously drank 2 glasses of wine per day but quit in January 2015.  Patient previously on statin medication for high cholesterol but stopped several weeks ago by PCP due to abdominal pain.   REVIEW OF SYSTEMS: Full 14 system review of systems performed and notable only for weight gain, pain.  ALLERGIES: No Known Allergies  HOME MEDICATIONS: Outpatient Prescriptions Prior to Visit  Medication Sig Dispense Refill  . aspirin 325 MG EC tablet Take 325 mg by mouth daily.        . diphenhydrAMINE (BENADRYL) 25 mg capsule Take 25 mg by mouth every 6 (six) hours as needed.        . gabapentin (NEURONTIN) 100 MG capsule Take 1 capsule (100 mg total) by mouth 3 (three) times daily.  90 capsule  0  . ciprofloxacin (CIPRO) 500 MG tablet Take 1 tablet (500 mg total) by mouth 2 (two) times daily.  20 tablet  0  . metroNIDAZOLE (FLAGYL) 250 MG tablet Take 1 tablet (250 mg total) by mouth 3 (three) times daily.  30 tablet  0   No facility-administered  medications prior to visit.    PAST MEDICAL HISTORY: Past Medical History  Diagnosis Date  . HYPERLIPIDEMIA 09/03/2010  . CERVICAL RADICULOPATHY, LEFT 09/03/2010  . Cervical spondylosis 11/10/2011  . COPD (chronic obstructive pulmonary disease) 11/10/2011    PAST SURGICAL HISTORY: Past Surgical History  Procedure Laterality Date  . Colonoscopy      FAMILY HISTORY: Family History  Problem Relation Age of Onset  . Hypertension Other   . Colon cancer Sister   . Breast cancer Sister   . Heart disease Father   . Hip fracture Mother     SOCIAL HISTORY:  History   Social History  . Marital Status: Married    Spouse Name: Ernie    Number of Children: 1  . Years of Education: College   Occupational History  . retired    Social History Main Topics  . Smoking status: Former Smoker -- 1.00 packs/day for 30 years    Types: Cigarettes    Quit date: 07/14/2011  . Smokeless tobacco: Never Used     Comment: QUIT 2013   . Alcohol Use: 0.0 oz/week     Comment: wine occasionally  . Drug Use: No  . Sexual Activity: Not on file   Other Topics Concern  . Not on file   Social History Narrative   Patient lives at home with spouse.   Caffeine Use: 2 cups daily     PHYSICAL EXAM  Filed Vitals:   09/13/13 1047  BP:  116/70  Pulse: 64  Temp: 98.3 F (36.8 C)  TempSrc: Oral  Height: 5\' 2"  (1.575 m)  Weight: 138 lb (62.596 kg)    Not recorded    Body mass index is 25.23 kg/(m^2).  GENERAL EXAM: Patient is in no distress; well developed, nourished and groomed; neck is supple  CARDIOVASCULAR: Regular rate and rhythm, no murmurs, no carotid bruits; TEMPORAL ARTERIES PALPABLE AND SYMM. NO TEMPORAL ARTERY TENDERNESS.   NEUROLOGIC: MENTAL STATUS: awake, alert, oriented to person, place and time, recent and remote memory intact, normal attention and concentration, language fluent, comprehension intact, naming intact, fund of knowledge appropriate CRANIAL NERVE: no  papilledema on fundoscopic exam, pupils equal and reactive to light, visual fields full to confrontation, extraocular muscles intact, no nystagmus, facial sensation and strength symmetric, hearing intact, palate elevates symmetrically, uvula midline, shoulder shrug symmetric, tongue midline. MOTOR: normal bulk and tone, full strength in the BUE, BLE SENSORY: normal and symmetric to light touch, pinprick, temperature, vibration COORDINATION: finger-nose-finger, fine finger movements normal REFLEXES: deep tendon reflexes TRACE symmetric GAIT/STATION: narrow based gait; able to walk on toes, heels and tandem; romberg is negative    DIAGNOSTIC DATA (LABS, IMAGING, TESTING) - I reviewed patient records, labs, notes, testing and imaging myself where available.  Lab Results  Component Value Date   WBC 8.9 08/17/2013   HGB 16.3* 09/03/2013   HCT 48.0* 09/03/2013   MCV 104.3* 08/17/2013   PLT 182.0 08/17/2013      Component Value Date/Time   NA 144 09/03/2013 2007   K 3.4* 09/03/2013 2007   CL 103 09/03/2013 2007   CO2 27 08/17/2013 1121   GLUCOSE 83 09/03/2013 2007   BUN 5* 09/03/2013 2007   CREATININE 0.60 09/03/2013 2007   CALCIUM 9.4 08/17/2013 1121   PROT 7.7 08/17/2013 1121   ALBUMIN 3.7 08/17/2013 1121   AST 21 08/17/2013 1121   ALT 8 08/17/2013 1121   ALKPHOS 57 08/17/2013 1121   BILITOT 0.7 08/17/2013 1121   Lab Results  Component Value Date   CHOL 235* 11/10/2011   HDL 82.40 11/10/2011   LDLDIRECT 142.7 11/10/2011   TRIG 53.0 11/10/2011   CHOLHDL 3 11/10/2011   No results found for this basename: HGBA1C   No results found for this basename: VITAMINB12   Lab Results  Component Value Date   TSH 1.10 11/10/2011   I reviewed images myself and agree with interpretation. -VRP  09/03/13 MRI BRAIN (with and without) - No acute intracranial abnormality. No specific cause of left face /  scalp pain identified. Moderate for age nonspecific signal changes  in the brain, most commonly due to chronic small  vessel disease.   ASSESSMENT AND PLAN  80 y.o. year old female here with brief intermittent flashes of pain in the left frontal, temporal parietal and occipital regions. Most like represents occipital neuralgia. Patient has had some positive response of gabapentin. We'll increase gabapentin dose. In future may consider occipital nerve block.  PLAN: - increase gabapentin gradually to 300mg  TID  Return in about 4 months (around 01/13/2014) for with Charlott Holler or Penumalli.    Penni Bombard, MD 10/17/8293, 62:13 AM Certified in Neurology, Neurophysiology and Neuroimaging  [facility] [address] [place], [place] [address] [phone]

## 2013-11-07 DIAGNOSIS — L259 Unspecified contact dermatitis, unspecified cause: Secondary | ICD-10-CM | POA: Diagnosis not present

## 2013-11-07 DIAGNOSIS — L218 Other seborrheic dermatitis: Secondary | ICD-10-CM | POA: Diagnosis not present

## 2013-11-07 DIAGNOSIS — L738 Other specified follicular disorders: Secondary | ICD-10-CM | POA: Diagnosis not present

## 2013-11-07 DIAGNOSIS — L659 Nonscarring hair loss, unspecified: Secondary | ICD-10-CM | POA: Diagnosis not present

## 2013-11-21 DIAGNOSIS — L218 Other seborrheic dermatitis: Secondary | ICD-10-CM | POA: Diagnosis not present

## 2014-01-04 DIAGNOSIS — L819 Disorder of pigmentation, unspecified: Secondary | ICD-10-CM | POA: Diagnosis not present

## 2014-01-04 DIAGNOSIS — L708 Other acne: Secondary | ICD-10-CM | POA: Diagnosis not present

## 2014-01-04 DIAGNOSIS — Z411 Encounter for cosmetic surgery: Secondary | ICD-10-CM | POA: Diagnosis not present

## 2014-01-04 DIAGNOSIS — L219 Seborrheic dermatitis, unspecified: Secondary | ICD-10-CM | POA: Diagnosis not present

## 2014-01-09 ENCOUNTER — Encounter (INDEPENDENT_AMBULATORY_CARE_PROVIDER_SITE_OTHER): Payer: Self-pay

## 2014-01-09 ENCOUNTER — Encounter: Payer: Self-pay | Admitting: Nurse Practitioner

## 2014-01-09 ENCOUNTER — Ambulatory Visit (INDEPENDENT_AMBULATORY_CARE_PROVIDER_SITE_OTHER): Payer: Medicare Other | Admitting: Nurse Practitioner

## 2014-01-09 VITALS — BP 143/71 | HR 58 | Ht 62.5 in | Wt 140.0 lb

## 2014-01-09 DIAGNOSIS — G500 Trigeminal neuralgia: Secondary | ICD-10-CM | POA: Diagnosis not present

## 2014-01-09 NOTE — Patient Instructions (Signed)
You may wean yourself off of the Gabapentin now that the head pains have resolved.  There is a chance that the pains may return in the future, if they do, you may need to start taking the medication again.  Take only the lowest amount to help the symptoms.  We will see you in the future as needed.    Trigeminal Neuralgia Trigeminal neuralgia is a nerve disorder that causes sudden attacks of severe facial pain. It is caused by damage to the trigeminal nerve, a major nerve in the face. It is more common in women and in the elderly, although it can also happen in younger patients. Attacks last from a few seconds to several minutes and can occur from a couple of times per year to several times per day. Trigeminal neuralgia can be a very distressing and disabling condition. Surgery may be needed in very severe cases if medical treatment does not give relief. HOME CARE INSTRUCTIONS   If your caregiver prescribed medication to help prevent attacks, take as directed.  To help prevent attacks:  Chew on the unaffected side of the mouth.  Avoid touching your face.  Avoid blasts of hot or cold air.  Men may wish to grow a beard to avoid having to shave. SEEK IMMEDIATE MEDICAL CARE IF:  Pain is unbearable and your medicine does not help.  You develop new, unexplained symptoms (problems).  You have problems that may be related to a medication you are taking. Document Released: 06/26/2000 Document Revised: 09/21/2011 Document Reviewed: 04/26/2009 [facility] Patient Information 2015 [place], [place]. This information is not intended to replace advice given to you by your health care provider. Make sure you discuss any questions you have with your health care provider.

## 2014-01-09 NOTE — Progress Notes (Signed)
PATIENT: Theresa Arnold DOB: [birth year]  REASON FOR VISIT: routine follow up for left head pain HISTORY FROM: patient  HISTORY OF PRESENT ILLNESS: 80 year old right-handed female with history of hypercholesteremia, former cigarette smoker, here for evaluation of left head pain episodes.   Update 01/09/14 (LL): Since last visit, pains have completely resolved.  She has started weaning off of Gabapentin because she thinks her vision is more blurry and it makes her very tired. She denies any jaw pain, pain with chewing or headache.  09/13/13 (VP): 2-3 weeks ago patient had onset of left frontal, temporal and parietal sharp flashes of pain lasting for one second at a time. These increased in frequency up to every minute. Patient went to the emergency room for evaluation. Patient was diagnosed with possible trigeminal neuralgia and discharged on gabapentin 100 mg 3 times a day. Patient had no vision changes, headache, numbness, extremity weakness or numbness. No prodromal infections, stress, sleep problems. Patient is a former 30-pack-year smoker, quit in 2013. She also previously drank 2 glasses of wine per day but quit in January 2015. Patient previously on statin medication for high cholesterol but stopped several weeks ago by PCP due to abdominal pain.   REVIEW OF SYSTEMS: Full 14 system review of systems performed and notable only for no complaints.  ALLERGIES: No Known Allergies  HOME MEDICATIONS: Outpatient Prescriptions Prior to Visit  Medication Sig Dispense Refill  . aspirin 325 MG EC tablet Take 325 mg by mouth daily.        . diphenhydrAMINE (BENADRYL) 25 mg capsule Take 25 mg by mouth every 6 (six) hours as needed.        . gabapentin (NEURONTIN) 300 MG capsule Take 1 capsule (300 mg total) by mouth 3 (three) times daily.  90 capsule  11  . FLUOCINOLONE ACETONIDE SCALP 0.01 % OIL as needed.       No facility-administered medications prior to visit.    PHYSICAL EXAM Filed  Vitals:   01/09/14 1131  BP: 143/71  Pulse: 58  Height: 5' 2.5" (1.588 m)  Weight: 140 lb (63.504 kg)   Body mass index is 25.18 kg/(m^2).  Visual Acuity Screening   Right eye Left eye Both eyes  Without correction: 20/40 20/50   With correction:       Generalized: Well developed, in no acute distress  Head: normocephalic and atraumatic. Oropharynx benign  Neck: Supple, no carotid bruits  Cardiac: Regular rate rhythm, no murmur, TEMPORAL ARTERIES PALPABLE AND SYMM. NO TEMPORAL ARTERY TENDERNESS.  Musculoskeletal: No deformity   Neurological examination  Mentation: Alert oriented to time, place, history taking. Follows all commands speech and language fluent Cranial nerve II-XII: Fundoscopic exam not done. Pupils were equal round reactive to light extraocular movements were full, visual field were full on confrontational test. Facial sensation and strength were normal. hearing was intact to finger rubbing bilaterally. Uvula tongue midline. head turning and shoulder shrug and were normal and symmetric Motor: The motor testing reveals 5 over 5 strength of all 4 extremities. Good symmetric motor tone is noted throughout.  Sensory: Sensory testing is intact to soft touch on all 4 extremities.  Coordination: Cerebellar testing reveals good finger-nose-finger. Gait and station: Gait is normal. Tandem gait is normal. Romberg is negative. Reflexes: Deep tendon reflexes are symmetric and normal bilaterally.   DIAGNOSTIC DATA (LABS, IMAGING, TESTING) - I reviewed patient records, labs, notes, testing and imaging myself where available.  09/03/13 MRI BRAIN (with and without) - No  acute intracranial abnormality. No specific cause of left face /  scalp pain identified. Moderate for age nonspecific signal changes in the brain, most commonly due to chronic small vessel disease.   ASSESSMENT AND PLAN  80 y.o. year old female here with brief intermittent flashes of pain in the left frontal, temporal  parietal and occipital regions. Most like represents trigeminal neuralgia. Patient has had positive response of gabapentin.  Since last visit pain resolved and she is weaning off Gabapentin.  PLAN:  - decrease gabapentin gradually to off. She is advised that pain may return, and if so she may restart the Gabapentin. - Follow up as needed.  Philmore Pali, MSN, NP-C 01/09/2014, 12:19 PM [facility] [address] [place], [place] [address] [phone]  Note: This document was prepared with digital dictation and possible smart phrase technology. Any transcriptional errors that result from this process are unintentional.

## 2014-01-10 ENCOUNTER — Ambulatory Visit: Payer: Medicare Other | Admitting: Internal Medicine

## 2014-01-11 ENCOUNTER — Ambulatory Visit (INDEPENDENT_AMBULATORY_CARE_PROVIDER_SITE_OTHER): Payer: Medicare Other | Admitting: Internal Medicine

## 2014-01-11 ENCOUNTER — Encounter: Payer: Self-pay | Admitting: Internal Medicine

## 2014-01-11 VITALS — BP 108/70 | HR 67 | Temp 97.9°F | Ht 62.0 in | Wt 142.0 lb

## 2014-01-11 DIAGNOSIS — J4489 Other specified chronic obstructive pulmonary disease: Secondary | ICD-10-CM

## 2014-01-11 DIAGNOSIS — J449 Chronic obstructive pulmonary disease, unspecified: Secondary | ICD-10-CM

## 2014-01-11 DIAGNOSIS — I872 Venous insufficiency (chronic) (peripheral): Secondary | ICD-10-CM | POA: Diagnosis not present

## 2014-01-11 DIAGNOSIS — E785 Hyperlipidemia, unspecified: Secondary | ICD-10-CM

## 2014-01-11 MED ORDER — HYDROCHLOROTHIAZIDE 12.5 MG PO TABS: 12.5000 mg | ORAL_TABLET | Freq: Every day | ORAL | Status: DC

## 2014-01-11 NOTE — Progress Notes (Signed)
   Subjective:    Patient ID: Theresa Arnold, female    DOB: [birth year], 80 y.o.   MRN: [id number]  HPI  Here with swelling to legs (feet and ankles), worse for 2 wks, better in last few days, always better in the am after leg elevation, then worse by evening again.  No leg pain but feet feel tight , harder to put shoes on.  Pt denies chest pain, increased sob or doe, wheezing, orthopnea, PND, increased LE swelling, palpitations, dizziness or syncope.   Pt denies polydipsia, polyuria, Pt denies new neurological symptoms such as new headache, or facial or extremity weakness or numbness  . Aug 17 2013 labs d/w pt and husband, as well as echo Nov 19, 2011 with normal EF.  Has hx of COPD, no known pulm HTN..  Pt denies fever, wt loss, night sweats, loss of appetite, or other constitutional symptoms Past Medical History  Diagnosis Date  . HYPERLIPIDEMIA 09/03/2010  . CERVICAL RADICULOPATHY, LEFT 09/03/2010  . Cervical spondylosis 11/10/2011  . COPD (chronic obstructive pulmonary disease) 11/10/2011   Past Surgical History  Procedure Laterality Date  . Colonoscopy      reports that she quit smoking about 2 years ago. Her smoking use included Cigarettes. She has a 30 pack-year smoking history. She has never used smokeless tobacco. She reports that she drinks alcohol. She reports that she does not use illicit drugs. family history includes Breast cancer in her sister; Colon cancer in her sister; Heart disease in her father; Hip fracture in her mother; Hypertension in her other. No Known Allergies Current Outpatient Prescriptions on File Prior to Visit  Medication Sig Dispense Refill  . aspirin 325 MG EC tablet Take 325 mg by mouth daily.        . diphenhydrAMINE (BENADRYL) 25 mg capsule Take 25 mg by mouth every 6 (six) hours as needed.         No current facility-administered medications on file prior to visit.   Review of Systems  Constitutional: Negative for unusual diaphoresis or other sweats    HENT: Negative for ringing in ear Eyes: Negative for double vision or worsening visual disturbance.  Respiratory: Negative for choking and stridor.   Gastrointestinal: Negative for vomiting or other signifcant bowel change Genitourinary: Negative for hematuria or decreased urine volume.  Musculoskeletal: Negative for other MSK pain or swelling Skin: Negative for color change and worsening wound.  Neurological: Negative for tremors and numbness other than noted  Psychiatric/Behavioral: Negative for decreased concentration or agitation other than above       Objective:   Physical Exam BP 108/70  Pulse 67  Temp(Src) 97.9 F (36.6 C) (Oral)  Ht 5\' 2"  (1.575 m)  Wt 142 lb (64.411 kg)  BMI 25.97 kg/m2  SpO2 92% VS noted,  Constitutional: Pt appears well-developed, well-nourished.  HENT: Head: NCAT.  Right Ear: External ear normal.  Left Ear: External ear normal.  Eyes: . Pupils are equal, round, and reactive to light. Conjunctivae and EOM are normal Neck: Normal range of motion. Neck supple.  Cardiovascular: Normal rate and regular rhythm.   Pulmonary/Chest: Effort normal and breath sounds normal.  Abd:  Soft, NT, ND, + BS Neurological: Pt is alert. Not confused , motor grossly intact Skin: Skin is warm. No rash, trace to 1+ ankle edema bilat , mult leg varicosities bilat Psychiatric: Pt behavior is normal. No agitation.     Assessment & Plan:

## 2014-01-11 NOTE — Patient Instructions (Signed)
Please take all new medication as prescribed  - the mild fluid pill only if swelling is present  Please also try the compression stockings, and low salt diet  Please continue all other medications as before, and refills have been done if requested.  Please have the pharmacy call with any other refills you may need.  Please continue your efforts at being more active, low cholesterol diet, and weight control.  Please keep your appointments with your specialists as you may have planned

## 2014-01-11 NOTE — Progress Notes (Signed)
Pre visit review using our clinic review tool, if applicable. No additional management support is needed unless otherwise documented below in the visit note. 

## 2014-01-12 DIAGNOSIS — I872 Venous insufficiency (chronic) (peripheral): Secondary | ICD-10-CM | POA: Insufficient documentation

## 2014-01-12 NOTE — Assessment & Plan Note (Signed)
stable overall by history and exam, recent data reviewed with pt, and pt to continue medical treatment as before,  to f/u any worsening symptoms or concerns SpO2 Readings from Last 3 Encounters:  01/11/14 92%  09/03/13 99%  08/16/13 93%

## 2014-01-12 NOTE — Assessment & Plan Note (Signed)
Mild, very upsetting to pt, for hct 12.5 prn, low salt diet, wt control, compression stockings, leg elevation

## 2014-01-12 NOTE — Assessment & Plan Note (Signed)
stable overall by history and exam, recent data reviewed with pt, and pt to continue medical treatment as before,  to f/u any worsening symptoms or concerns Lab Results  Component Value Date   CHOL 235* 11/10/2011   HDL 82.40 11/10/2011   LDLDIRECT 142.7 11/10/2011   TRIG 53.0 11/10/2011   CHOLHDL 3 11/10/2011   For lower chol diet, declines other tx at this time

## 2014-01-15 ENCOUNTER — Ambulatory Visit: Payer: Medicare Other | Admitting: Nurse Practitioner

## 2014-05-22 DIAGNOSIS — L219 Seborrheic dermatitis, unspecified: Secondary | ICD-10-CM | POA: Diagnosis not present

## 2015-08-27 ENCOUNTER — Encounter: Payer: Self-pay | Admitting: Gastroenterology

## 2016-02-20 DIAGNOSIS — D485 Neoplasm of uncertain behavior of skin: Secondary | ICD-10-CM | POA: Diagnosis not present

## 2017-11-24 ENCOUNTER — Other Ambulatory Visit (INDEPENDENT_AMBULATORY_CARE_PROVIDER_SITE_OTHER): Payer: Medicare Other

## 2017-11-24 ENCOUNTER — Ambulatory Visit (INDEPENDENT_AMBULATORY_CARE_PROVIDER_SITE_OTHER): Payer: Medicare Other | Admitting: Internal Medicine

## 2017-11-24 ENCOUNTER — Encounter: Payer: Self-pay | Admitting: Internal Medicine

## 2017-11-24 VITALS — BP 142/86 | HR 86 | Temp 98.1°F | Ht 62.0 in | Wt 130.0 lb

## 2017-11-24 DIAGNOSIS — G8929 Other chronic pain: Secondary | ICD-10-CM | POA: Diagnosis not present

## 2017-11-24 DIAGNOSIS — E785 Hyperlipidemia, unspecified: Secondary | ICD-10-CM

## 2017-11-24 DIAGNOSIS — J449 Chronic obstructive pulmonary disease, unspecified: Secondary | ICD-10-CM

## 2017-11-24 DIAGNOSIS — M25562 Pain in left knee: Secondary | ICD-10-CM | POA: Diagnosis not present

## 2017-11-24 DIAGNOSIS — Z23 Encounter for immunization: Secondary | ICD-10-CM | POA: Diagnosis not present

## 2017-11-24 LAB — CBC WITH DIFFERENTIAL/PLATELET
BASOS ABS: 0.1 10*3/uL (ref 0.0–0.1)
Basophils Relative: 1 % (ref 0.0–3.0)
EOS ABS: 0.1 10*3/uL (ref 0.0–0.7)
Eosinophils Relative: 1.1 % (ref 0.0–5.0)
HEMATOCRIT: 38.4 % (ref 36.0–46.0)
HEMOGLOBIN: 12.6 g/dL (ref 12.0–15.0)
LYMPHS PCT: 26.4 % (ref 12.0–46.0)
Lymphs Abs: 2.6 10*3/uL (ref 0.7–4.0)
MCHC: 32.7 g/dL (ref 30.0–36.0)
MCV: 95.4 fl (ref 78.0–100.0)
MONOS PCT: 7.9 % (ref 3.0–12.0)
Monocytes Absolute: 0.8 10*3/uL (ref 0.1–1.0)
Neutro Abs: 6.3 10*3/uL (ref 1.4–7.7)
Neutrophils Relative %: 63.6 % (ref 43.0–77.0)
Platelets: 180 10*3/uL (ref 150.0–400.0)
RBC: 4.03 Mil/uL (ref 3.87–5.11)
RDW: 17.2 % — ABNORMAL HIGH (ref 11.5–15.5)
WBC: 9.9 10*3/uL (ref 4.0–10.5)

## 2017-11-24 LAB — BASIC METABOLIC PANEL
BUN: 11 mg/dL (ref 6–23)
CHLORIDE: 105 meq/L (ref 96–112)
CO2: 27 mEq/L (ref 19–32)
CREATININE: 0.53 mg/dL (ref 0.40–1.20)
Calcium: 9.2 mg/dL (ref 8.4–10.5)
GFR: 141.52 mL/min (ref >60.00)
GLUCOSE: 82 mg/dL (ref 70–99)
POTASSIUM: 4.4 meq/L (ref 3.5–5.1)
Sodium: 142 mEq/L (ref 135–145)

## 2017-11-24 LAB — LIPID PANEL
Cholesterol: 209 mg/dL — ABNORMAL HIGH (ref 0–200)
HDL: 76.3 mg/dL (ref >39.00)
LDL CALC: 118 mg/dL — AB (ref 0–99)
NonHDL: 132.52
Total CHOL/HDL Ratio: 3
Triglycerides: 73 mg/dL (ref 0.0–149.0)
VLDL: 14.6 mg/dL (ref 0.0–40.0)

## 2017-11-24 LAB — URINALYSIS, ROUTINE W REFLEX MICROSCOPIC
Bilirubin Urine: NEGATIVE
Hgb urine dipstick: NEGATIVE
Nitrite: NEGATIVE
PH: 7 (ref 5.0–8.0)
Specific Gravity, Urine: 1.02 (ref 1.000–1.030)
Total Protein, Urine: NEGATIVE
URINE GLUCOSE: NEGATIVE
UROBILINOGEN UA: 0.2 (ref 0.0–1.0)

## 2017-11-24 LAB — HEPATIC FUNCTION PANEL
ALT: 6 U/L (ref 0–35)
AST: 18 U/L (ref 0–37)
Albumin: 3.7 g/dL (ref 3.5–5.2)
Alkaline Phosphatase: 67 U/L (ref 39–117)
Bilirubin, Direct: 0.1 mg/dL (ref 0.0–0.3)
Total Bilirubin: 0.4 mg/dL (ref 0.2–1.2)
Total Protein: 7.4 g/dL (ref 6.0–8.3)

## 2017-11-24 LAB — TSH: TSH: 0.75 u[IU]/mL (ref 0.35–4.50)

## 2017-11-24 NOTE — Patient Instructions (Addendum)
You had the Tdap tetanus shot and Prevnar pneumonia shot today  Please make an appointment with Sports Medicine in this office at the checkout desk as you leave  Please continue all other medications as before, and refills have been done if requested.  Please have the pharmacy call with any other refills you may need.  Please continue your efforts at being more active, low cholesterol diet, and weight control.  You are otherwise up to date with prevention measures today.  Please keep your appointments with your specialists as you may have planned  Please go to the LAB in the Basement (turn left off the elevator) for the tests to be done today  You will be contacted by phone if any changes need to be made immediately.  Otherwise, you will receive a letter about your results with an explanation, but please check with MyChart first.  Please remember to sign up for MyChart if you have not done so, as this will be important to you in the future with finding out test results, communicating by private email, and scheduling acute appointments online when needed.  Please return in 1 year for your yearly visit, or sooner if needed

## 2017-11-24 NOTE — Assessment & Plan Note (Signed)
?   Control, for f/u lab with labs today, cont low chol diet

## 2017-11-24 NOTE — Assessment & Plan Note (Signed)
Likely worsening DJD, pt to make appt with sports medicine in this office

## 2017-11-24 NOTE — Addendum Note (Signed)
Addended by: Juliet Rude on: 11/24/2017 02:39 PM   Modules accepted: Orders

## 2017-11-24 NOTE — Progress Notes (Signed)
Subjective:    Patient ID: Theresa Arnold, female    DOB: 1932/01/27, 84 y.o.   MRN: [id number]  HPI  Here to [place] as new pt, last seen 2015, Here for yearly visit,  Overall doing ok;  Pt denies Chest pain, worsening SOB, DOE, wheezing, orthopnea, PND, worsening LE edema, palpitations, dizziness or syncope.  Pt denies neurological change such as new headache, facial or extremity weakness.  Pt denies polydipsia, polyuria, or low sugar symptoms. Pt states overall good compliance with treatment and medications, good tolerability, and has been trying to follow appropriate diet.  Pt denies worsening depressive symptoms, suicidal ideation or panic. No fever, night sweats, wt loss, loss of appetite, or other constitutional symptoms.  Pt states good ability with ADL's, has low fall risk, home safety reviewed and adequate, no other significant changes in hearing or vision, and only occasionally active with exercise. Does have marked Left knee pain x several yrs, recently worse with swelling and some sense of unstable patella, did have one fall but during standing changing shoes and became off balance  No other new complaints or interval hx Past Medical History:  Diagnosis Date  . CERVICAL RADICULOPATHY, LEFT 09/03/2010  . Cervical spondylosis 11/10/2011  . COPD (chronic obstructive pulmonary disease) ([place]) 11/10/2011  . HYPERLIPIDEMIA 09/03/2010   Past Surgical History:  Procedure Laterality Date  . COLONOSCOPY      reports that she quit smoking about 6 years ago. Her smoking use included cigarettes. She has a 30.00 pack-year smoking history. She has never used smokeless tobacco. She reports that she drinks alcohol. She reports that she does not use drugs. family history includes Breast cancer in her sister; Colon cancer in her sister; Heart disease in her father; Hip fracture in her mother; Hypertension in her other. No Known Allergies Current Outpatient Medications on File Prior to Visit    Medication Sig Dispense Refill  . aspirin 325 MG EC tablet Take 325 mg by mouth daily.       No current facility-administered medications on file prior to visit.    Review of Systems Constitutional: Negative for other unusual diaphoresis, sweats, appetite or weight changes HENT: Negative for other worsening hearing loss, ear pain, facial swelling, mouth sores or neck stiffness.   Eyes: Negative for other worsening pain, redness or other visual disturbance.  Respiratory: Negative for other stridor or swelling Cardiovascular: Negative for other palpitations or other chest pain  Gastrointestinal: Negative for worsening diarrhea or loose stools, blood in stool, distention or other pain Genitourinary: Negative for hematuria, flank pain or other change in urine volume.  Musculoskeletal: Negative for myalgias or other joint swelling.  Skin: Negative for other color change, or other wound or worsening drainage.  Neurological: Negative for other syncope or numbness. Hematological: Negative for other adenopathy or swelling Psychiatric/Behavioral: Negative for hallucinations, other worsening agitation, SI, self-injury, or new decreased concentration All other system neg per pt    Objective:   Physical Exam BP (!) 142/86   Pulse 86   Temp 98.1 F (36.7 C) (Oral)   Ht 5\' 2"  (1.575 m)   Wt 130 lb (59 kg)   SpO2 95%   BMI 23.78 kg/m  VS noted,  Constitutional: Pt appears in NAD HENT: Head: NCAT.  Right Ear: External ear normal.  Left Ear: External ear normal.  Eyes: . Pupils are equal, round, and reactive to light. Conjunctivae and EOM are normal Nose: without d/c or deformity Neck: Neck supple. Gross normal ROM Cardiovascular:  Normal rate and regular rhythm.   Pulmonary/Chest: Effort normal and breath sounds without rales or wheezing.  Abd:  Soft, NT, ND, + BS, no organomegaly Left knee with crepitus and 1+ effusion Neurological: Pt is alert. At baseline orientation, motor grossly  intact Skin: Skin is warm. No rashes, other new lesions, no LE edema Psychiatric: Pt behavior is normal without agitation  No other exam findins    Assessment & Plan:

## 2017-11-24 NOTE — Assessment & Plan Note (Signed)
stable overall by history and exam, and pt to continue medical treatment as before,  to f/u any worsening symptoms or concerns 

## 2017-11-25 ENCOUNTER — Ambulatory Visit (INDEPENDENT_AMBULATORY_CARE_PROVIDER_SITE_OTHER): Payer: Medicare Other | Admitting: Family Medicine

## 2017-11-25 ENCOUNTER — Encounter: Payer: Self-pay | Admitting: Family Medicine

## 2017-11-25 DIAGNOSIS — M1712 Unilateral primary osteoarthritis, left knee: Secondary | ICD-10-CM

## 2017-11-25 NOTE — Progress Notes (Signed)
Theresa Arnold - 84 y.o. female MRN [id number]  Date of birth: [birth year]  SUBJECTIVE:  Including CC & ROS.  Chief Complaint  Patient presents with  . Left knee pain    [name] is a 84 y.o. female that is presenting with left knee pain. Ongoing for one month.   Pain is occurring in the medial joint line. Pain is intermittent.  Admits to swelling. Denies injury. Denies pain with extension or flexion. Denies surgeries. Denies any inciting event. No prior injection. Pain is worse with walking.    Review of Systems  Constitutional: Negative for fever.  HENT: Negative for congestion.   Respiratory: Negative for cough.   Cardiovascular: Negative for chest pain.  Gastrointestinal: Negative for abdominal pain.  Musculoskeletal: Positive for arthralgias.  Skin: Negative for color change.  Neurological: Negative for weakness.  Hematological: Negative for adenopathy.  Psychiatric/Behavioral: Negative for agitation.    HISTORY: Past Medical, Surgical, Social, and Family History Reviewed & Updated per EMR.   Pertinent Historical Findings include:  Past Medical History:  Diagnosis Date  . CERVICAL RADICULOPATHY, LEFT 09/03/2010  . Cervical spondylosis 11/10/2011  . COPD (chronic obstructive pulmonary disease) ([place]) 11/10/2011  . HYPERLIPIDEMIA 09/03/2010    Past Surgical History:  Procedure Laterality Date  . COLONOSCOPY      No Known Allergies  Family History  Problem Relation Age of Onset  . Hypertension Other   . Colon cancer Sister   . Breast cancer Sister   . Heart disease Father   . Hip fracture Mother      Social History   Socioeconomic History  . Marital status: Married    Spouse name: Ernie  . Number of children: 1  . Years of education: 76  . Highest education level: Not on file  Occupational History  . Occupation: retired  Scientific laboratory technician  . Financial resource strain: Not on file  . Food insecurity:    Worry: Not on file    Inability: Not on file  .  Transportation needs:    Medical: Not on file    Non-medical: Not on file  Tobacco Use  . Smoking status: Former Smoker    Packs/day: 1.00    Years: 30.00    Pack years: 30.00    Types: Cigarettes    Last attempt to quit: 07/14/2011    Years since quitting: 6.3  . Smokeless tobacco: Never Used  . Tobacco comment: QUIT 2013   Substance and Sexual Activity  . Alcohol use: Yes    Alcohol/week: 0.0 oz    Comment: wine occasionally  . Drug use: No  . Sexual activity: Not on file  Lifestyle  . Physical activity:    Days per week: Not on file    Minutes per session: Not on file  . Stress: Not on file  Relationships  . Social connections:    Talks on phone: Not on file    Gets together: Not on file    Attends religious service: Not on file    Active member of club or organization: Not on file    Attends meetings of clubs or organizations: Not on file    Relationship status: Not on file  . Intimate partner violence:    Fear of current or ex partner: Not on file    Emotionally abused: Not on file    Physically abused: Not on file    Forced sexual activity: Not on file  Other Topics Concern  . Not on file  Social History Narrative   Patient lives at home with spouseOrthoptist)   Patient is retired.   Patient has a college education.   Patient has one adult child.   Patient is right-handed   Caffeine Use: 1 cup daily     PHYSICAL EXAM:  VS: BP 116/62 (BP Location: Other (Comment), Patient Position: Sitting, Cuff Size: Normal)   Pulse 71   Temp 97.8 F (36.6 C) (Oral)   Ht 5\' 2"  (1.575 m)   Wt 130 lb (59 kg)   SpO2 96%   BMI 23.78 kg/m  Physical Exam Gen: NAD, alert, cooperative with exam, well-appearing ENT: normal lips, normal nasal mucosa,  Eye: normal EOM, normal conjunctiva and lids CV:  no edema, +2 pedal pulses   Resp: no accessory muscle use, non-labored,  Skin: no rashes, no areas of induration  Neuro: normal tone, normal sensation to touch Psych:  normal  insight, alert and oriented MSK:  Left Knee: Normal to inspection with no erythema or effusion or obvious bony abnormalities. Palpation normal with no warmth, joint line tenderness, patellar tenderness, or condyle tenderness. ROM full in flexion and extension and lower leg rotation. Ligaments with solid consistent endpoints including ACL, PCL, LCL, MCL. Negative Mcmurray's, Apley's, and Thessalonian tests. Non painful patellar compression. Patellar glide without crepitus. Patellar and quadriceps tendons unremarkable. Hamstring and quadriceps strength is normal.  Neurovascularly intact   Limited ultrasound: left knee:  Mild to moderate effusion  Normal appearing QT and PT  Medial joint space with mild narrowing  Lateral joint space with moderate joint space narrowing  Summary: degenerative changes of the medial and lateral joint line.   Ultrasound and interpretation by Clearance Coots, MD        ASSESSMENT & PLAN:   OA (osteoarthritis) of knee Symptoms consistent with arthritis of the knee.  Likely has component of patellofemoral syndrome as well. -Counseled on supportive care -Counseled on home exercise therapy. -If no improvement consider injection or imaging

## 2017-11-25 NOTE — Assessment & Plan Note (Signed)
Symptoms consistent with arthritis of the knee.  Likely has component of patellofemoral syndrome as well. -Counseled on supportive care -Counseled on home exercise therapy. -If no improvement consider injection or imaging

## 2017-11-25 NOTE — Patient Instructions (Signed)
Please try the exercises  Please try to wear compression on your knee  Please try to ride a bike  Please follow up in 4-6 weeks if no improvement.

## 2017-12-16 ENCOUNTER — Ambulatory Visit (HOSPITAL_COMMUNITY)
Admission: EM | Admit: 2017-12-16 | Discharge: 2017-12-16 | Disposition: A | Discharge status: Home / Self Care | Payer: Medicare Other | Attending: Family Medicine | Admitting: Family Medicine

## 2017-12-16 ENCOUNTER — Encounter (HOSPITAL_COMMUNITY): Payer: Self-pay | Admitting: Family Medicine

## 2017-12-16 DIAGNOSIS — F458 Other somatoform disorders: Secondary | ICD-10-CM

## 2017-12-16 DIAGNOSIS — R0989 Other specified symptoms and signs involving the circulatory and respiratory systems: Secondary | ICD-10-CM

## 2017-12-16 MED ORDER — GI COCKTAIL ~~LOC~~
30.0000 mL | Freq: Once | ORAL | Status: AC
  Administered 2017-12-16: 30 mL via ORAL

## 2017-12-16 MED ORDER — GI COCKTAIL ~~LOC~~
ORAL | Status: AC
  Filled 2017-12-16: qty 30

## 2017-12-16 NOTE — Discharge Instructions (Signed)
No alarming signs on exam. As discussed, you may still have a fish bone, it could also be pain from a scrape. If continues to have pain, follow up with PCP/GI for further evaluation needed. Monitor for any worsening of symptoms, swelling of the throat, trouble breathing, trouble swallowing, leaning forward to breath, drooling, go to the emergency department for further evaluation needed.

## 2017-12-16 NOTE — ED Provider Notes (Signed)
[facility]    CSN: [id number] Arrival date & time: 12/16/17  1700     History   Chief Complaint Chief Complaint  Patient presents with  . Swallowed Foreign Body    HPI Theresa Arnold is a 84 y.o. female.   84 year old female comes in for possible ingestion of foreign body.  States she was eating fish when she felt pain to the right side of her throat, and thinks she swallowed a fishbone.  Denies swelling to throat, trouble breathing, trouble swallowing, drooling, tripoding.  States only feels pain when she swallows.  Has tried eating some bread without relief.  Denies nausea, vomiting.     Past Medical History:  Diagnosis Date  . CERVICAL RADICULOPATHY, LEFT 09/03/2010  . Cervical spondylosis 11/10/2011  . COPD (chronic obstructive pulmonary disease) ([place]) 11/10/2011  . HYPERLIPIDEMIA 09/03/2010    Patient Active Problem List   Diagnosis Date Noted  . Unspecified venous (peripheral) insufficiency 01/12/2014  . Sprain of ankle, unspecified site 06/28/2013  . Preventative health care 11/10/2011  . Cervical spondylosis 11/10/2011  . OA (osteoarthritis) of knee 11/10/2011  . COPD (chronic obstructive pulmonary disease) ([place]) 11/10/2011  . Dizziness 11/10/2011  . Benign neoplasm of colon 11/24/2010  . Diverticulosis of colon (without mention of hemorrhage) 11/24/2010  . Encounter for long-term (current) use of other medications 10/07/2010  . HLD (hyperlipidemia) 09/03/2010  . FATIGUE 09/03/2010  . SMOKER 09/10/2009    Past Surgical History:  Procedure Laterality Date  . COLONOSCOPY      OB History   None      Home Medications    Prior to Admission medications   Medication Sig Start Date End Date Taking? Authorizing Provider  aspirin 325 MG EC tablet Take 325 mg by mouth daily.      [provider]    Family History Family History  Problem Relation Age of Onset  . Colon cancer Sister   . Breast cancer Sister   . Heart disease Father     . Hip fracture Mother   . Hypertension Other     Social History Social History   Tobacco Use  . Smoking status: Former Smoker    Packs/day: 1.00    Years: 30.00    Pack years: 30.00    Types: Cigarettes    Last attempt to quit: 07/14/2011    Years since quitting: 6.4  . Smokeless tobacco: Never Used  . Tobacco comment: QUIT 2013   Substance Use Topics  . Alcohol use: Yes    Alcohol/week: 0.0 oz    Comment: wine occasionally  . Drug use: No     Allergies   Patient has no known allergies.   Review of Systems Review of Systems  Reason unable to perform ROS: See HPI as above.     Physical Exam Triage Vital Signs ED Triage Vitals [12/16/17 1731]  Enc Vitals Group     BP (!) 136/56     Pulse Rate 74     Resp 18     Temp 98.2 F (36.8 C)     Temp src      SpO2 100 %     Weight      Height      Head Circumference      Peak Flow      Pain Score 5     Pain Loc      Pain Edu?      Excl. in [place]?    No data  found.  Updated Vital Signs BP (!) 136/56   Pulse 74   Temp 98.2 F (36.8 C)   Resp 18   SpO2 100%   Physical Exam  Constitutional: She is oriented to person, place, and time. She appears well-developed and well-nourished. No distress.  HENT:  Head: Normocephalic and atraumatic.  Mouth/Throat: Uvula is midline, oropharynx is clear and moist and mucous membranes are normal. No tonsillar exudate.  Eyes: Pupils are equal, round, and reactive to light. Conjunctivae are normal.  Neck: Trachea normal and normal range of motion. Neck supple. No tracheal tenderness present.  No swelling noted. No tenderness to palpation.   Cardiovascular: Normal rate, regular rhythm and normal heart sounds. Exam reveals no gallop and no friction rub.  No murmur heard. Pulmonary/Chest: Effort normal and breath sounds normal. No stridor. No respiratory distress. She has no wheezes. She has no rales.  Neurological: She is alert and oriented to person, place, and time.     UC  Treatments / Results  Labs (all labs ordered are listed, but only abnormal results are displayed) Labs Reviewed - No data to display  EKG None  Radiology No results found.  Procedures Procedures (including critical care time)  Medications Ordered in UC Medications  gi cocktail (Maalox,Lidocaine,Donnatal) (30 mLs Oral Given 12/16/17 1814)    Initial Impression / Assessment and Plan / UC Course  I have reviewed the triage vital signs and the nursing notes.  Pertinent labs & imaging results that were available during my care of the patient were reviewed by me and considered in my medical decision making (see chart for details).    Patient with mild relief after GI cocktail.  Patient speaking in full sentences without any distress.  No trouble swallowing.  Will have patient continue to monitor, follow-up with GI for further evaluation if symptoms not improving. Final Clinical Impressions(s) / UC Diagnoses   Final diagnoses:  Globus sensation    ED Prescriptions    None        Ok Edwards, PA-C 12/16/17 2045

## 2017-12-16 NOTE — ED Triage Notes (Signed)
Pt here for fish bone stuck in her throat. She reports that this happened PTA. No acute distress.

## 2018-04-18 ENCOUNTER — Emergency Department (HOSPITAL_COMMUNITY)
Admission: EM | Admit: 2018-04-18 | Discharge: 2018-04-18 | Disposition: A | Discharge status: Home / Self Care | Payer: Medicare Other | Attending: Emergency Medicine | Admitting: Emergency Medicine

## 2018-04-18 ENCOUNTER — Other Ambulatory Visit: Payer: Self-pay

## 2018-04-18 ENCOUNTER — Encounter (HOSPITAL_COMMUNITY): Payer: Self-pay | Admitting: Emergency Medicine

## 2018-04-18 ENCOUNTER — Emergency Department (HOSPITAL_COMMUNITY): Payer: Medicare Other

## 2018-04-18 DIAGNOSIS — R9431 Abnormal electrocardiogram [ECG] [EKG]: Secondary | ICD-10-CM | POA: Diagnosis not present

## 2018-04-18 DIAGNOSIS — J449 Chronic obstructive pulmonary disease, unspecified: Secondary | ICD-10-CM | POA: Insufficient documentation

## 2018-04-18 DIAGNOSIS — Z87891 Personal history of nicotine dependence: Secondary | ICD-10-CM | POA: Diagnosis not present

## 2018-04-18 DIAGNOSIS — Z7982 Long term (current) use of aspirin: Secondary | ICD-10-CM | POA: Insufficient documentation

## 2018-04-18 DIAGNOSIS — G500 Trigeminal neuralgia: Secondary | ICD-10-CM

## 2018-04-18 DIAGNOSIS — R51 Headache: Secondary | ICD-10-CM | POA: Diagnosis not present

## 2018-04-18 DIAGNOSIS — H538 Other visual disturbances: Secondary | ICD-10-CM | POA: Diagnosis not present

## 2018-04-18 LAB — COMPREHENSIVE METABOLIC PANEL
ALK PHOS: 65 U/L (ref 38–126)
ALT: 10 U/L (ref 0–44)
ANION GAP: 11 (ref 5–15)
AST: 31 U/L (ref 15–41)
Albumin: 3.8 g/dL (ref 3.5–5.0)
BILIRUBIN TOTAL: 1 mg/dL (ref 0.3–1.2)
BUN: 10 mg/dL (ref 8–23)
CALCIUM: 9.8 mg/dL (ref 8.9–10.3)
CO2: 23 mmol/L (ref 22–32)
Chloride: 105 mmol/L (ref 98–111)
Creatinine, Ser: 0.65 mg/dL (ref 0.44–1.00)
GFR calc Af Amer: >60 mL/min (ref >60)
GFR calc non Af Amer: >60 mL/min (ref >60)
GLUCOSE: 79 mg/dL (ref 70–99)
Potassium: 4.5 mmol/L (ref 3.5–5.1)
Sodium: 139 mmol/L (ref 135–145)
TOTAL PROTEIN: 7.7 g/dL (ref 6.5–8.1)

## 2018-04-18 LAB — CBC
HCT: 44.3 % (ref 36.0–46.0)
HEMOGLOBIN: 14 g/dL (ref 12.0–15.0)
MCH: 33.3 pg (ref 26.0–34.0)
MCHC: 31.6 g/dL (ref 30.0–36.0)
MCV: 105.5 fL — AB (ref 78.0–100.0)
Platelets: ADEQUATE 10*3/uL (ref 150–400)
RBC: 4.2 MIL/uL (ref 3.87–5.11)
RDW: 13.4 % (ref 11.5–15.5)
WBC: 8.8 10*3/uL (ref 4.0–10.5)

## 2018-04-18 LAB — I-STAT TROPONIN, ED: Troponin i, poc: 0 ng/mL (ref 0.00–0.08)

## 2018-04-18 LAB — APTT: aPTT: 26 seconds (ref 24–36)

## 2018-04-18 LAB — I-STAT CHEM 8, ED
BUN: 11 mg/dL (ref 8–23)
CALCIUM ION: 1.05 mmol/L — AB (ref 1.15–1.40)
CREATININE: 0.5 mg/dL (ref 0.44–1.00)
Chloride: 108 mmol/L (ref 98–111)
GLUCOSE: 82 mg/dL (ref 70–99)
HCT: 43 % (ref 36.0–46.0)
Hemoglobin: 14.6 g/dL (ref 12.0–15.0)
Potassium: 4.5 mmol/L (ref 3.5–5.1)
Sodium: 138 mmol/L (ref 135–145)
TCO2: 26 mmol/L (ref 22–32)

## 2018-04-18 LAB — DIFFERENTIAL
Basophils Absolute: 0.1 10*3/uL (ref 0.0–0.1)
Basophils Relative: 1 %
EOS PCT: 2 %
Eosinophils Absolute: 0.2 10*3/uL (ref 0.0–0.7)
Lymphocytes Relative: 32 %
Lymphs Abs: 2.8 10*3/uL (ref 0.7–4.0)
MONOS PCT: 7 %
Monocytes Absolute: 0.6 10*3/uL (ref 0.1–1.0)
NEUTROS PCT: 58 %
Neutro Abs: 5.1 10*3/uL (ref 1.7–7.7)

## 2018-04-18 LAB — PROTIME-INR
INR: 0.97
Prothrombin Time: 12.8 seconds (ref 11.4–15.2)

## 2018-04-18 MED ORDER — KETOROLAC TROMETHAMINE 15 MG/ML IJ SOLN: 15.0000 mg | Freq: Once | INTRAMUSCULAR | Status: DC

## 2018-04-18 MED ORDER — GABAPENTIN 100 MG PO CAPS: 100.0000 mg | ORAL_CAPSULE | Freq: Three times a day (TID) | ORAL | 1 refills | Status: DC

## 2018-04-18 NOTE — ED Provider Notes (Signed)
[facility] EMERGENCY DEPARTMENT Provider Note   CSN: [id number] Arrival date & time: 04/18/18  1916     History   Chief Complaint Chief Complaint  Patient presents with  . Headache    HPI Theresa Arnold is a 84 y.o. female presented with a 2-day history of intermittent left-sided neck pain with radiation to her bifrontal area.  She rates the pain at an 8/10 but currently with no symptoms.  She also states that her right eye feels funny but denies right eye pain, blurry vision, dizziness, lightheadedness, nausea, vomiting, weakness, focal neurological deficits, myalgia.  She reports that 4 years ago she had similar symptoms and underwent an MRI which did not reveal any acute abnormalities.  Chart review notes that her symptoms at that point was consistent with trigeminal neuralgia and was discharged on Neurontin 100 mg 3 times daily.   Past Medical History:  Diagnosis Date  . CERVICAL RADICULOPATHY, LEFT 09/03/2010  . Cervical spondylosis 11/10/2011  . COPD (chronic obstructive pulmonary disease) ([place]) 11/10/2011  . HYPERLIPIDEMIA 09/03/2010    Patient Active Problem List   Diagnosis Date Noted  . Unspecified venous (peripheral) insufficiency 01/12/2014  . Sprain of ankle, unspecified site 06/28/2013  . Preventative health care 11/10/2011  . Cervical spondylosis 11/10/2011  . OA (osteoarthritis) of knee 11/10/2011  . COPD (chronic obstructive pulmonary disease) ([place]) 11/10/2011  . Dizziness 11/10/2011  . Benign neoplasm of colon 11/24/2010  . Diverticulosis of colon (without mention of hemorrhage) 11/24/2010  . Encounter for long-term (current) use of other medications 10/07/2010  . HLD (hyperlipidemia) 09/03/2010  . FATIGUE 09/03/2010  . SMOKER 09/10/2009    Past Surgical History:  Procedure Laterality Date  . COLONOSCOPY       OB History   None      Home Medications    Prior to Admission medications   Medication Sig Start Date End Date  Taking? Authorizing Provider  aspirin 325 MG EC tablet Take 325 mg by mouth daily.      [provider]  gabapentin (NEURONTIN) 100 MG capsule Take 1 capsule (100 mg total) by mouth 3 (three) times daily. 04/18/18   Jean Rosenthal, MD    Family History Family History  Problem Relation Age of Onset  . Colon cancer Sister   . Breast cancer Sister   . Heart disease Father   . Hip fracture Mother   . Hypertension Other     Social History Social History   Tobacco Use  . Smoking status: Former Smoker    Packs/day: 1.00    Years: 30.00    Pack years: 30.00    Types: Cigarettes    Last attempt to quit: 07/14/2011    Years since quitting: 6.7  . Smokeless tobacco: Never Used  . Tobacco comment: QUIT 2013   Substance Use Topics  . Alcohol use: Yes    Comment: wine occasionally  . Drug use: No     Allergies   Patient has no known allergies.   Review of Systems Review of Systems  Constitutional: Negative.   HENT: Negative.  Ear pain: right eye feels funny    Eyes: Negative.   Respiratory: Negative.   Cardiovascular: Negative.   Gastrointestinal: Negative.   Musculoskeletal: Negative.   Skin: Negative.   Neurological: Positive for headaches (Left sided neck pain). Negative for dizziness, tremors, syncope, facial asymmetry, speech difficulty, weakness, light-headedness and numbness.  Psychiatric/Behavioral: Negative.      Physical Exam Updated Vital Signs  BP 135/84   Pulse 62   Temp 98.4 F (36.9 C) (Oral)   Resp 18   SpO2 99%   Physical Exam  Constitutional: She is oriented to person, place, and time. She appears well-developed and well-nourished.  Non-toxic appearance. She does not appear ill. No distress.  HENT:  Head: Normocephalic and atraumatic.  Eyes: Pupils are equal, round, and reactive to light. EOM are normal.  Neck: Normal range of motion. Neck supple.  Cardiovascular: Normal rate, regular rhythm and normal heart sounds.  Pulmonary/Chest:  Effort normal and breath sounds normal.  Abdominal: Soft. Bowel sounds are normal.  Musculoskeletal: Normal range of motion.  Neurological: She is alert and oriented to person, place, and time. She has normal strength. GCS eye subscore is 4. GCS verbal subscore is 5. GCS motor subscore is 6.  No focal neurological deficits Lateral temple nontender to palpation No acute vision changes on exam No tenderness to palpation at the jaw  Skin: Skin is warm.  Psychiatric: She has a normal mood and affect. Her behavior is normal.     ED Treatments / Results  Labs (all labs ordered are listed, but only abnormal results are displayed) Labs Reviewed  CBC - Abnormal; Notable for the following components:      Result Value   MCV 105.5 (*)    All other components within normal limits  I-STAT CHEM 8, ED - Abnormal; Notable for the following components:   Calcium, Ion 1.05 (*)    All other components within normal limits  PROTIME-INR  APTT  DIFFERENTIAL  COMPREHENSIVE METABOLIC PANEL  I-STAT TROPONIN, ED  CBG MONITORING, ED    EKG None  Radiology Ct Head Wo Contrast  Result Date: 04/18/2018 CLINICAL DATA:  Left neck pain radiating to the head with some blurred vision in the right eye and photophobia. No reported injury. EXAM: CT HEAD WITHOUT CONTRAST TECHNIQUE: Contiguous axial images were obtained from the base of the skull through the vertex without intravenous contrast. COMPARISON:  Brain MR dated 09/06/2013. FINDINGS: Brain: Moderate diffuse enlargement of the ventricles and subarachnoid spaces with mild progression. Mildly progressive patchy white matter low density in both cerebral hemispheres. No intracranial hemorrhage, mass lesion or CT evidence of acute infarction. Vascular: No hyperdense vessel or unexpected calcification. Skull: Normal. Negative for fracture or focal lesion. Sinuses/Orbits: Right maxillary sinus retention cyst. Status post bilateral cataract extraction. Other: None.  IMPRESSION: 1. No acute abnormality. 2. Mildly progressive atrophy and chronic small vessel white matter ischemic changes. Electronically Signed   By: Claudie Revering M.D.   On: 04/18/2018 20:42    Procedures Procedures (including critical care time)  Medications Ordered in ED Medications  ketorolac (TORADOL) 15 MG/ML injection 15 mg (has no administration in time range)     Initial Impression / Assessment and Plan / ED Course  I have reviewed the triage vital signs and the nursing notes.  Pertinent labs & imaging results that were available during my care of the patient were reviewed by me and considered in my medical decision making (see chart for details).   84 year old pleasant woman presented with intermittent left-sided neck pain, bifrontal headache and abnormal feeling in her right eye.  Denies blurry vision, lightheadedness or pain in her eye.  Also with no nausea or vomiting.  On physical exam, she had no focal neurological deficits, bilateral temporal lobe nontender to palpation, no tenderness along the jaw.   Her symptoms are somewhat consistent with trigeminal neuralgia and I am not  convinced that she has temporal arteritis, Bell's palsy, stroke, intracranial mass, intracranial bleed or vascular process as her CT scan was unremarkable.  In 2015, she was diagnosed with trigeminal neuralgia and was discharged with Neurontin 100 mg TID.   My plan is to discharge her with Neurontin 100 mg TID and have close follow-up with neurology.  However given her age I will advised that she begin taking 1 tablet at night for 3 days and if she does not experience drowsiness or dizziness she can slowly increase to twice daily and subsequently three times a day until follow up with neurology  Final Clinical Impressions(s) / ED Diagnoses   Final diagnoses:  Trigeminal neuralgia    ED Discharge Orders         Ordered    gabapentin (NEURONTIN) 100 MG capsule  3 times daily    Note to Pharmacy:   IM program   04/18/18 2126           Jean Rosenthal, MD 04/18/18 2127    Carmin Muskrat, MD 04/18/18 2305

## 2018-04-18 NOTE — ED Notes (Signed)
Pt transported to CT ?

## 2018-04-18 NOTE — ED Triage Notes (Signed)
Pt reports pain started to L side of neck and radiated to head. Pt reports she had some blurry vision to R eye, also reports lights seem brighter. Pt does not have drift to any extremity, no facial droop noted.

## 2018-04-18 NOTE — Discharge Instructions (Addendum)
Ms. Gotschall,   It was a pleasure taking care of you here at the emergency department.  The CT scan we did of your head did not show stroke, tumor or bleeding in your brain.  Also all the other blood work we did came back normal.  You may have what we called trigeminal neuralgia which is basically an irritation of a nerve on your face.  I will discharge you with a medication called Neurontin 100mg .  He will take 1 tablet at night for the next 3 days and if you do not experience drowsiness you can advance to taking 1 tab twice a day and if this is tolerated he can take 1 tablet 3 times a day until you follow-up with neurology.  I would like for you to follow-up with neurology  ~Take Care  Dr. Eileen Stanford

## 2018-09-21 ENCOUNTER — Other Ambulatory Visit (INDEPENDENT_AMBULATORY_CARE_PROVIDER_SITE_OTHER): Payer: Medicare Other

## 2018-09-21 ENCOUNTER — Other Ambulatory Visit: Payer: Self-pay

## 2018-09-21 ENCOUNTER — Encounter: Payer: Self-pay | Admitting: Internal Medicine

## 2018-09-21 ENCOUNTER — Ambulatory Visit (INDEPENDENT_AMBULATORY_CARE_PROVIDER_SITE_OTHER): Payer: Medicare Other | Admitting: Internal Medicine

## 2018-09-21 VITALS — BP 126/80 | HR 77 | Temp 97.9°F | Ht 62.0 in | Wt 126.0 lb

## 2018-09-21 DIAGNOSIS — R109 Unspecified abdominal pain: Secondary | ICD-10-CM | POA: Insufficient documentation

## 2018-09-21 DIAGNOSIS — I872 Venous insufficiency (chronic) (peripheral): Secondary | ICD-10-CM | POA: Diagnosis not present

## 2018-09-21 DIAGNOSIS — J449 Chronic obstructive pulmonary disease, unspecified: Secondary | ICD-10-CM | POA: Diagnosis not present

## 2018-09-21 LAB — BASIC METABOLIC PANEL
BUN: 14 mg/dL (ref 6–23)
CO2: 29 mEq/L (ref 19–32)
Calcium: 9.7 mg/dL (ref 8.4–10.5)
Chloride: 104 mEq/L (ref 96–112)
Creatinine, Ser: 0.58 mg/dL (ref 0.40–1.20)
GFR: 119.75 mL/min (ref >60.00)
GLUCOSE: 86 mg/dL (ref 70–99)
Potassium: 4.2 mEq/L (ref 3.5–5.1)
SODIUM: 141 meq/L (ref 135–145)

## 2018-09-21 LAB — CBC WITH DIFFERENTIAL/PLATELET
Basophils Absolute: 0 10*3/uL (ref 0.0–0.1)
Basophils Relative: 0.7 % (ref 0.0–3.0)
EOS PCT: 1.7 % (ref 0.0–5.0)
Eosinophils Absolute: 0.1 10*3/uL (ref 0.0–0.7)
HEMATOCRIT: 42.1 % (ref 36.0–46.0)
HEMOGLOBIN: 14.1 g/dL (ref 12.0–15.0)
LYMPHS PCT: 34.3 % (ref 12.0–46.0)
Lymphs Abs: 2.2 10*3/uL (ref 0.7–4.0)
MCHC: 33.5 g/dL (ref 30.0–36.0)
MCV: 101.4 fl — ABNORMAL HIGH (ref 78.0–100.0)
MONOS PCT: 9.1 % (ref 3.0–12.0)
Monocytes Absolute: 0.6 10*3/uL (ref 0.1–1.0)
Neutro Abs: 3.4 10*3/uL (ref 1.4–7.7)
Neutrophils Relative %: 54.2 % (ref 43.0–77.0)
Platelets: 188 10*3/uL (ref 150.0–400.0)
RBC: 4.15 Mil/uL (ref 3.87–5.11)
RDW: 14.2 % (ref 11.5–15.5)
WBC: 6.3 10*3/uL (ref 4.0–10.5)

## 2018-09-21 LAB — HEPATIC FUNCTION PANEL
ALT: 5 U/L (ref 0–35)
AST: 17 U/L (ref 0–37)
Albumin: 4.2 g/dL (ref 3.5–5.2)
Alkaline Phosphatase: 80 U/L (ref 39–117)
BILIRUBIN TOTAL: 0.5 mg/dL (ref 0.2–1.2)
Bilirubin, Direct: 0.1 mg/dL (ref 0.0–0.3)
TOTAL PROTEIN: 8.1 g/dL (ref 6.0–8.3)

## 2018-09-21 LAB — LIPASE: LIPASE: 15 U/L (ref 11.0–59.0)

## 2018-09-21 MED ORDER — DICYCLOMINE HCL 10 MG PO CAPS: 10.0000 mg | ORAL_CAPSULE | Freq: Three times a day (TID) | ORAL | 1 refills | Status: DC | PRN

## 2018-09-21 NOTE — Assessment & Plan Note (Signed)
stable overall by history and exam, recent data reviewed with pt, and pt to continue medical treatment as before,  to f/u any worsening symptoms or concerns  

## 2018-09-21 NOTE — Progress Notes (Signed)
Subjective:    Patient ID: Theresa Arnold, female    DOB: [birth year], 85 y.o.   MRN: [id number]  HPI  Hee with 1 mo onset griping diffuse abd pain gradually worsening but intermittent, passing gas assoc with some improvement maybe, No real change with low lactose diet.  No fever, Denies worsening reflux, dysphagia,  bowel change or blood but did have one episode n/v.  No real wt loss, appetite ok, no fever, chills.  Denies urinary symptoms such as dysuria, frequency, urgency, flank pain, hematuria or n/v, fever, chills.  Last barium enema 2012 with diverticulosis only, but exam incomplete.  Last flex sig with polypectomy per Dr [name] in 2012.  Pt denies chest pain, increased sob or doe, wheezing, orthopnea, PND, increased LE swelling, palpitations, dizziness or syncope.  Pt denies new neurological symptoms such as new headache, or facial or extremity weakness or numbness   Pt denies polydipsia, polyuria, Past Medical History:  Diagnosis Date  . CERVICAL RADICULOPATHY, LEFT 09/03/2010  . Cervical spondylosis 11/10/2011  . COPD (chronic obstructive pulmonary disease) ([place]) 11/10/2011  . HYPERLIPIDEMIA 09/03/2010   Past Surgical History:  Procedure Laterality Date  . COLONOSCOPY      reports that she quit smoking about 7 years ago. Her smoking use included cigarettes. She has a 30.00 pack-year smoking history. She has never used smokeless tobacco. She reports current alcohol use. She reports that she does not use drugs. family history includes Breast cancer in her sister; Colon cancer in her sister; Heart disease in her father; Hip fracture in her mother; Hypertension in an other family member. No Known Allergies Current Outpatient Medications on File Prior to Visit  Medication Sig Dispense Refill  . aspirin 325 MG EC tablet Take 325 mg by mouth daily.      Marland Kitchen gabapentin (NEURONTIN) 100 MG capsule Take 1 capsule (100 mg total) by mouth 3 (three) times daily. 30 capsule 1   No current  facility-administered medications on file prior to visit.    Review of Systems  Constitutional: Negative for other unusual diaphoresis or sweats HENT: Negative for ear discharge or swelling Eyes: Negative for other worsening visual disturbances Respiratory: Negative for stridor or other swelling  Gastrointestinal: Negative for worsening distension or other blood Genitourinary: Negative for retention or other urinary change Musculoskeletal: Negative for other MSK pain or swelling Skin: Negative for color change or other new lesions Neurological: Negative for worsening tremors and other numbness  Psychiatric/Behavioral: Negative for worsening agitation or other fatigue ALl other system neg per pt    Objective:   Physical Exam BP 126/80   Pulse 77   Temp 97.9 F (36.6 C) (Oral)   Ht 5\' 2"  (1.575 m)   Wt 126 lb (57.2 kg)   SpO2 96%   BMI 23.05 kg/m  VS noted,  Constitutional: Pt appears in NAD HENT: Head: NCAT.  Right Ear: External ear normal.  Left Ear: External ear normal.  Eyes: . Pupils are equal, round, and reactive to light. Conjunctivae and EOM are normal Nose: without d/c or deformity Neck: Neck supple. Gross normal ROM Cardiovascular: Normal rate and regular rhythm.   Pulmonary/Chest: Effort normal and breath sounds without rales or wheezing.  Abd:  Soft, NT, ND, + BS, no organomegaly - benign Neurological: Pt is alert. At baseline orientation, motor grossly intact Skin: Skin is warm. No rashes, other new lesions, no LE edema Psychiatric: Pt behavior is normal without agitation but 2+ nervous  No other exam findings  Lab Results  Component Value Date   WBC 6.3 09/21/2018   HGB 14.1 09/21/2018   HCT 42.1 09/21/2018   PLT 188.0 09/21/2018   GLUCOSE 86 09/21/2018   CHOL 209 (H) 11/24/2017   TRIG 73.0 11/24/2017   HDL 76.30 11/24/2017   LDLDIRECT 142.7 11/10/2011   LDLCALC 118 (H) 11/24/2017   ALT 5 09/21/2018   AST 17 09/21/2018   NA 141 09/21/2018   K 4.2  09/21/2018   CL 104 09/21/2018   CREATININE 0.58 09/21/2018   BUN 14 09/21/2018   CO2 29 09/21/2018   TSH 0.75 11/24/2017   INR 0.97 04/18/2018        Assessment & Plan:

## 2018-09-21 NOTE — Patient Instructions (Signed)
Please take all new medication as prescribed - the generic Bentyl for the pains  Please continue all other medications as before, and refills have been done if requested.  Please have the pharmacy call with any other refills you may need.  Please keep your appointments with your specialists as you may have planned  Please go to the LAB in the Basement (turn left off the elevator) for the tests to be done today  You will be contacted by phone if any changes need to be made immediately.  Otherwise, you will receive a letter about your results with an explanation, but please check with MyChart first.  Please remember to sign up for MyChart if you have not done so, as this will be important to you in the future with finding out test results, communicating by private email, and scheduling acute appointments online when needed.

## 2018-09-21 NOTE — Assessment & Plan Note (Signed)
Exam benign, suspect IBS related, for labs today, pt is wanting CT abd but I would not think that warranted at this time

## 2018-10-13 ENCOUNTER — Other Ambulatory Visit: Payer: Self-pay | Admitting: Internal Medicine

## 2018-10-28 ENCOUNTER — Other Ambulatory Visit: Payer: Self-pay | Admitting: Internal Medicine

## 2018-11-01 ENCOUNTER — Telehealth: Payer: Self-pay

## 2018-11-01 NOTE — Telephone Encounter (Signed)
Ok for inperson OV 

## 2018-11-01 NOTE — Telephone Encounter (Signed)
Pt called in per Terence Lux, CMA stating that she is still having stomach spasms after her 3/11 visit and is unable to do a video visit. Is it ok to schedule an in-office ov for this patient? Please advise.

## 2018-11-01 NOTE — Telephone Encounter (Signed)
Pt scheduled for tomorrow at 10:20 am

## 2018-11-02 ENCOUNTER — Other Ambulatory Visit (INDEPENDENT_AMBULATORY_CARE_PROVIDER_SITE_OTHER): Payer: Medicare Other

## 2018-11-02 ENCOUNTER — Telehealth: Payer: Self-pay

## 2018-11-02 ENCOUNTER — Ambulatory Visit (INDEPENDENT_AMBULATORY_CARE_PROVIDER_SITE_OTHER): Payer: Medicare Other | Admitting: Internal Medicine

## 2018-11-02 ENCOUNTER — Encounter: Payer: Self-pay | Admitting: Internal Medicine

## 2018-11-02 ENCOUNTER — Other Ambulatory Visit: Payer: Self-pay

## 2018-11-02 ENCOUNTER — Other Ambulatory Visit: Payer: Self-pay | Admitting: Internal Medicine

## 2018-11-02 ENCOUNTER — Ambulatory Visit (INDEPENDENT_AMBULATORY_CARE_PROVIDER_SITE_OTHER)
Admission: RE | Admit: 2018-11-02 | Discharge: 2018-11-02 | Disposition: A | Discharge status: Home / Self Care | Payer: Medicare Other | Source: Ambulatory Visit | Attending: Internal Medicine | Admitting: Internal Medicine

## 2018-11-02 VITALS — BP 132/78 | HR 64 | Temp 98.1°F | Ht 62.0 in | Wt 125.0 lb

## 2018-11-02 DIAGNOSIS — R109 Unspecified abdominal pain: Secondary | ICD-10-CM

## 2018-11-02 DIAGNOSIS — R1032 Left lower quadrant pain: Secondary | ICD-10-CM

## 2018-11-02 DIAGNOSIS — J449 Chronic obstructive pulmonary disease, unspecified: Secondary | ICD-10-CM | POA: Diagnosis not present

## 2018-11-02 DIAGNOSIS — K6389 Other specified diseases of intestine: Secondary | ICD-10-CM | POA: Diagnosis not present

## 2018-11-02 LAB — CBC WITH DIFFERENTIAL/PLATELET
Basophils Absolute: 0.1 10*3/uL (ref 0.0–0.1)
Basophils Relative: 0.8 % (ref 0.0–3.0)
Eosinophils Absolute: 0.1 10*3/uL (ref 0.0–0.7)
Eosinophils Relative: 1.2 % (ref 0.0–5.0)
HCT: 44.2 % (ref 36.0–46.0)
Hemoglobin: 14.7 g/dL (ref 12.0–15.0)
Lymphocytes Relative: 27.8 % (ref 12.0–46.0)
Lymphs Abs: 2.2 10*3/uL (ref 0.7–4.0)
MCHC: 33.2 g/dL (ref 30.0–36.0)
MCV: 101.9 fl — ABNORMAL HIGH (ref 78.0–100.0)
Monocytes Absolute: 0.6 10*3/uL (ref 0.1–1.0)
Monocytes Relative: 7.6 % (ref 3.0–12.0)
Neutro Abs: 5 10*3/uL (ref 1.4–7.7)
Neutrophils Relative %: 62.6 % (ref 43.0–77.0)
Platelets: 156 10*3/uL (ref 150.0–400.0)
RBC: 4.34 Mil/uL (ref 3.87–5.11)
RDW: 13.7 % (ref 11.5–15.5)
WBC: 8 10*3/uL (ref 4.0–10.5)

## 2018-11-02 LAB — HEPATIC FUNCTION PANEL
ALT: 6 U/L (ref 0–35)
AST: 18 U/L (ref 0–37)
Albumin: 4.1 g/dL (ref 3.5–5.2)
Alkaline Phosphatase: 75 U/L (ref 39–117)
Bilirubin, Direct: 0.1 mg/dL (ref 0.0–0.3)
Total Bilirubin: 0.5 mg/dL (ref 0.2–1.2)
Total Protein: 8 g/dL (ref 6.0–8.3)

## 2018-11-02 LAB — BASIC METABOLIC PANEL
BUN: 14 mg/dL (ref 6–23)
CO2: 28 mEq/L (ref 19–32)
Calcium: 9.4 mg/dL (ref 8.4–10.5)
Chloride: 101 mEq/L (ref 96–112)
Creatinine, Ser: 0.53 mg/dL (ref 0.40–1.20)
GFR: 132.85 mL/min (ref >60.00)
Glucose, Bld: 80 mg/dL (ref 70–99)
Potassium: 3.9 mEq/L (ref 3.5–5.1)
Sodium: 140 mEq/L (ref 135–145)

## 2018-11-02 LAB — URINALYSIS, ROUTINE W REFLEX MICROSCOPIC
Bilirubin Urine: NEGATIVE
Hgb urine dipstick: NEGATIVE
Ketones, ur: 15 — AB
Leukocytes,Ua: NEGATIVE
Nitrite: NEGATIVE
Specific Gravity, Urine: 1.02 (ref 1.000–1.030)
Total Protein, Urine: NEGATIVE
Urine Glucose: NEGATIVE
Urobilinogen, UA: 0.2 (ref 0.0–1.0)
pH: 7 (ref 5.0–8.0)

## 2018-11-02 LAB — LIPASE: Lipase: 11 U/L (ref 11.0–59.0)

## 2018-11-02 MED ORDER — IOHEXOL 300 MG/ML  SOLN
100.0000 mL | Freq: Once | INTRAMUSCULAR | Status: AC | PRN
  Administered 2018-11-02: 14:00:00 100 mL via INTRAVENOUS

## 2018-11-02 MED ORDER — TRAMADOL HCL 50 MG PO TABS: 50.0000 mg | ORAL_TABLET | Freq: Four times a day (QID) | ORAL | 0 refills | Status: DC | PRN

## 2018-11-02 MED ORDER — ONDANSETRON HCL 4 MG PO TABS: 4.0000 mg | ORAL_TABLET | Freq: Three times a day (TID) | ORAL | 0 refills | Status: DC | PRN

## 2018-11-02 MED ORDER — METRONIDAZOLE 250 MG PO TABS: 250.0000 mg | ORAL_TABLET | Freq: Three times a day (TID) | ORAL | 0 refills | Status: AC

## 2018-11-02 MED ORDER — CIPROFLOXACIN HCL 500 MG PO TABS: 500.0000 mg | ORAL_TABLET | Freq: Two times a day (BID) | ORAL | 0 refills | Status: AC

## 2018-11-02 NOTE — Patient Instructions (Signed)
Please take all new medication as prescribed - the pain and nausea medications if needed  Please continue all other medications as before, and refills have been done if requested.  Please have the pharmacy call with any other refills you may need.  Please continue your efforts at being more active, low cholesterol diet, and weight control.  You are otherwise up to date with prevention measures today.  Please keep your appointments with your specialists as you may have planned  You will be contacted regarding the referral for: CT scan (to see [facility] now)  Please go to the LAB in the Basement (turn left off the elevator) for the tests to be done today (now)  You will be contacted by phone if any changes need to be made immediately.  Otherwise, you will receive a letter about your results with an explanation, but please check with MyChart first.  Please remember to sign up for MyChart if you have not done so, as this will be important to you in the future with finding out test results, communicating by private email, and scheduling acute appointments online when needed.

## 2018-11-02 NOTE — Telephone Encounter (Signed)
-----   Message from Biagio Borg, MD sent at 11/02/2018  2:10 PM EDT ----- Ok to let pt know - all labs ok, will also send letter

## 2018-11-02 NOTE — Assessment & Plan Note (Addendum)
Etiology unclear, but with pain worsening last few days, cant r/o diverticulitis, for CT scan today with labs,  to f/u any worsening symptoms or concerns; gave symptomatic meds for pain and nausea.  Note:  Total time for pt hx, exam, review of record with pt in the room, determination of diagnoses and plan for further eval and tx is > 40 min, with over 50% spent in coordination and counseling of patient including the differential dx, tx, further evaluation and other management of LLQ pain and COPD

## 2018-11-02 NOTE — Progress Notes (Signed)
Subjective:    Patient ID: Theresa Arnold, female    DOB: [birth year], 85 y.o.   MRN: [id number]  HPI  Here with > 1 mo mid and left lower quad abd pain, "griping", intermittent, sometimes mod to severe, assoc with occas nausea but no vomiting, fever, diarrhea/constipaton, reflux, dysphagia, radiation, or blood.  Appetite ok but often has to have BM within 1 hour of eating every time, and has lost several lbs in the past 6 mo.   Wt Readings from Last 3 Encounters:  11/02/18 125 lb (56.7 kg)  09/21/18 126 lb (57.2 kg)  11/25/17 130 lb (59 kg)  Not better with bentyl.  Nothing else makes better or worse.  Last flex sig 2012 per Dr Sherran Needs with severe diverticulosis.  Denies urinary symptoms such as dysuria, frequency, urgency, flank pain, hematuria or n/v, fever, chills.  Pt denies chest pain, increased sob or doe, wheezing, orthopnea, PND, increased LE swelling, palpitations, dizziness or syncope. Past Medical History:  Diagnosis Date  . CERVICAL RADICULOPATHY, LEFT 09/03/2010  . Cervical spondylosis 11/10/2011  . COPD (chronic obstructive pulmonary disease) ([place]) 11/10/2011  . HYPERLIPIDEMIA 09/03/2010   Past Surgical History:  Procedure Laterality Date  . COLONOSCOPY      reports that she quit smoking about 7 years ago. Her smoking use included cigarettes. She has a 30.00 pack-year smoking history. She has never used smokeless tobacco. She reports current alcohol use. She reports that she does not use drugs. family history includes Breast cancer in her sister; Colon cancer in her sister; Heart disease in her father; Hip fracture in her mother; Hypertension in an other family member. No Known Allergies Current Outpatient Medications on File Prior to Visit  Medication Sig Dispense Refill  . aspirin 325 MG EC tablet Take 325 mg by mouth daily.      Marland Kitchen dicyclomine (BENTYL) 10 MG capsule TAKE 1 CAPSULE (10 MG TOTAL) BY MOUTH 3 (THREE) TIMES DAILY AS NEEDED FOR SPASMS. 270 capsule 1   No current  facility-administered medications on file prior to visit.    Review of Systems  Constitutional: Negative for other unusual diaphoresis or sweats HENT: Negative for ear discharge or swelling Eyes: Negative for other worsening visual disturbances Respiratory: Negative for stridor or other swelling  Gastrointestinal: Negative for worsening distension or other blood Genitourinary: Negative for retention or other urinary change Musculoskeletal: Negative for other MSK pain or swelling Skin: Negative for color change or other new lesions Neurological: Negative for worsening tremors and other numbness  Psychiatric/Behavioral: Negative for worsening agitation or other fatigue All other system neg per pt    Objective:   Physical Exam BP 132/78   Pulse 64   Temp 98.1 F (36.7 C) (Oral)   Ht 5\' 2"  (1.575 m)   Wt 125 lb (56.7 kg)   SpO2 94%   BMI 22.86 kg/m  VS noted, mild ill appearing in pain Constitutional: Pt appears in NAD HENT: Head: NCAT.  Right Ear: External ear normal.  Left Ear: External ear normal.  Eyes: . Pupils are equal, round, and reactive to light. Conjunctivae and EOM are normal Nose: without d/c or deformity Neck: Neck supple. Gross normal ROM Cardiovascular: Normal rate and regular rhythm.   Pulmonary/Chest: Effort normal and breath sounds without rales or wheezing.  Abd:  Soft, ND, + BS, no organomegaly with mid and left sided, mostly LLQ tenderness, no guarding or rebound Neurological: Pt is alert. At baseline orientation, motor grossly intact Skin: Skin is warm.  No rashes, other new lesions, no LE edema Psychiatric: Pt behavior is normal without agitation  No other exam findings Lab Results  Component Value Date   WBC 6.3 09/21/2018   HGB 14.1 09/21/2018   HCT 42.1 09/21/2018   PLT 188.0 09/21/2018   GLUCOSE 86 09/21/2018   CHOL 209 (H) 11/24/2017   TRIG 73.0 11/24/2017   HDL 76.30 11/24/2017   LDLDIRECT 142.7 11/10/2011   LDLCALC 118 (H) 11/24/2017    ALT 5 09/21/2018   AST 17 09/21/2018   NA 141 09/21/2018   K 4.2 09/21/2018   CL 104 09/21/2018   CREATININE 0.58 09/21/2018   BUN 14 09/21/2018   CO2 29 09/21/2018   TSH 0.75 11/24/2017   INR 0.97 04/18/2018         Assessment & Plan:

## 2018-11-02 NOTE — Assessment & Plan Note (Signed)
stable overall by history and exam, recent data reviewed with pt, and pt to continue medical treatment as before,  to f/u any worsening symptoms or concerns  

## 2018-11-02 NOTE — Telephone Encounter (Signed)
Called pt, LVM.   CRM created.  

## 2018-11-03 ENCOUNTER — Telehealth: Payer: Self-pay | Admitting: Internal Medicine

## 2018-11-03 NOTE — Telephone Encounter (Signed)
I have reached back out to the patient. She has been informed of her lab results and CT results. Please follow up with her to schedule her OV with the specialist. Thank you!

## 2018-11-03 NOTE — Telephone Encounter (Signed)
Theresa Arnold, Patient called asking for her CT results and why a gastroenterology office was calling to schedule her to come in. Please reach out to her with this information as I did not see where the CT had been resulted/commented on by Dr. Jenny Reichmann. She was very upset hearing from the other office before she knew the scan results. This is the # she called in on  #[phone].

## 2018-11-08 ENCOUNTER — Other Ambulatory Visit: Payer: Self-pay

## 2018-11-08 ENCOUNTER — Ambulatory Visit (INDEPENDENT_AMBULATORY_CARE_PROVIDER_SITE_OTHER): Payer: Medicare Other | Admitting: Gastroenterology

## 2018-11-08 ENCOUNTER — Encounter: Payer: Self-pay | Admitting: Gastroenterology

## 2018-11-08 VITALS — Ht 62.0 in | Wt 125.0 lb

## 2018-11-08 DIAGNOSIS — K5732 Diverticulitis of large intestine without perforation or abscess without bleeding: Secondary | ICD-10-CM | POA: Diagnosis not present

## 2018-11-08 DIAGNOSIS — R933 Abnormal findings on diagnostic imaging of other parts of digestive tract: Secondary | ICD-10-CM | POA: Diagnosis not present

## 2018-11-08 NOTE — Progress Notes (Signed)
TELEHEALTH VISIT  Referring Provider: Biagio Borg, MD Primary Care Physician:  Biagio Borg, MD   Tele-visit due to COVID-19 pandemic Patient requested visit virtually, consented to the virtual encounter via audio enabled telemedicine application Contact made at: 8:34 428/20 Patient verified by name and date of birth Location of patient: Home Location provider: [facility] Names of persons participating: Me, patient, Tinnie Gens CMA Time spent on telehealth visit: 26 minutes I discussed the limitations of evaluation and management by telemedicine. The patient expressed understanding and agreed to proceed.  Reason for Consultation:  LLQ pain  IMPRESSION:  Abdominal CT scan: Sigmoid diverticulitis versus sigmoid mass LLQ pain 5 pound unintentional weight loss Family history of colon cancer (sister) History of incomplete screening colonoscopy 10/28/10 [name])    - unable to pass beyond 30 cm due to extensive diverticulosis, sigmoid polyp seen    - barium enema 10/28/10 showed sigmoid diverticulosis    - flex sig 12/01/10: sigmoid hyperplastic polyp    - colonoscopy 11/02/2003: pancolonic diverticulosis, most pronounced in the sigmoid colon  I personally reviewed her CT scan images. WBC and hemoglobin are normal. Will treat possible diverticulitis with a full course of antibiotics recommended, followed by colonoscopy to exclude malignancy.   Reviewed long term recommendations with diverticulosis to follow a high fiber diet or to use fiber supplements on a regular basis.  However, there is no need to avoid seeds, corn, berries, and nuts. Using NSAIDs may be associated with a moderately increased risk of occurrence of any episode of diverticulitis and complicated diverticulitis.   PLAN: Cipro 500 mg BID and Flagyl 250 TID x 10 days Avoid NSAIDs Colonoscopy in 6 weeks Consider CT scan if symptoms do not resolve in the meantime Follow-up in 2 weeks or earlier, as needed   I consented the patient discussing the risks, benefits, and alternatives to endoscopic evaluation. In particular, we discussed the risks that include, but are not limited to, reaction to medication, cardiopulmonary compromise, bleeding requiring blood transfusion, aspiration resulting in pneumonia, perforation requiring surgery, lack of diagnosis, severe illness requiring hospitalization, and even death. We reviewed the risk of missed lesion including polyps or even cancer. The patient acknowledges these risks and asks that we proceed.   HPI: Theresa Arnold is a 85 y.o. female referred by Dr. Jenny Reichmann for LLQ pain and a recent diagnosis of diverticulitis. The history is obtained through the patient and review of her electronic health record.   Doubles over with intermittent, severe LLQ and mid-abdominal pain that developed in March. Exacerbated by eating, but, not caused by eating. Associated flatus and urgency. Improved with defecation, that often occurs within one hour of eating. No change with movement or exertion.  Crackers are the only food that do not cause her pain. No nausea. No diarrhea or constipation. No change in bowel habits. No blood or mucous. Has lost 5 pounds. Normal appetite. Some sitophobia. No history of similar symptoms.   Labs 11/02/18: normal CMP, norma CBC except for MCV 102  CT of the abd/pelvis with contrast 11/02/18 showed irregular wall thickening and edema involving a 7cm segment of the sigmoid colon. Sparse diverticula are present in this segment raising suspicion for diverticulitis, however, there is not a significant amount of adjacent inflammation. Underlying sigmoid mass cannot be excluded.   No improvement with Bentyl or Zofran. She was prescribed Cipro 500 mg BID and Flagyl 250 mg TID 11/02/18 but she didn't take them because she didn't know what they  were for. She has the medications at home but has not taken them.  Incomplete colonoscopy 10/28/10: sigmoid polyp not  removed, unable to pass beyond 30 cm due to extensive diverticulosis Flex sig 12/01/10: sigmoid hyperplastic polyp Barium enema 10/28/10: sigmoid diverticulosis Colonoscopy 11/02/2003: pancolonic diverticulosis, most pronounced in the sigmoid colon   Sister with colon cancer. No other known family history of colon cancer or polyps. No family history of uterine/endometrial cancer, pancreatic cancer or gastric/stomach cancer.  Past Medical History:  Diagnosis Date  . CERVICAL RADICULOPATHY, LEFT 09/03/2010  . Cervical spondylosis 11/10/2011  . COPD (chronic obstructive pulmonary disease) ([place]) 11/10/2011  . HYPERLIPIDEMIA 09/03/2010    Past Surgical History:  Procedure Laterality Date  . COLONOSCOPY      Current Outpatient Medications  Medication Sig Dispense Refill  . aspirin 325 MG EC tablet Take 325 mg by mouth daily.      . ciprofloxacin (CIPRO) 500 MG tablet Take 1 tablet (500 mg total) by mouth 2 (two) times daily for 10 days. 20 tablet 0  . dicyclomine (BENTYL) 10 MG capsule TAKE 1 CAPSULE (10 MG TOTAL) BY MOUTH 3 (THREE) TIMES DAILY AS NEEDED FOR SPASMS. 270 capsule 1  . metroNIDAZOLE (FLAGYL) 250 MG tablet Take 1 tablet (250 mg total) by mouth 3 (three) times daily for 10 days. 30 tablet 0  . ondansetron (ZOFRAN) 4 MG tablet Take 1 tablet (4 mg total) by mouth every 8 (eight) hours as needed for nausea or vomiting. 30 tablet 0  . traMADol (ULTRAM) 50 MG tablet Take 1 tablet (50 mg total) by mouth every 6 (six) hours as needed. 30 tablet 0   No current facility-administered medications for this visit.     Allergies as of 11/08/2018  . (No Known Allergies)    Family History  Problem Relation Age of Onset  . Colon cancer Sister   . Breast cancer Sister   . Heart disease Father   . Hip fracture Mother   . Hypertension Other     Social History   Socioeconomic History  . Marital status: Married    Spouse name: Ernie  . Number of children: 1  . Years of education: 53  .  Highest education level: Not on file  Occupational History  . Occupation: retired  Scientific laboratory technician  . Financial resource strain: Not on file  . Food insecurity:    Worry: Not on file    Inability: Not on file  . Transportation needs:    Medical: Not on file    Non-medical: Not on file  Tobacco Use  . Smoking status: Former Smoker    Packs/day: 1.00    Years: 30.00    Pack years: 30.00    Types: Cigarettes    Last attempt to quit: 07/14/2011    Years since quitting: 7.3  . Smokeless tobacco: Never Used  . Tobacco comment: QUIT 2013   Substance and Sexual Activity  . Alcohol use: Yes    Comment: wine occasionally  . Drug use: No  . Sexual activity: Not on file  Lifestyle  . Physical activity:    Days per week: Not on file    Minutes per session: Not on file  . Stress: Not on file  Relationships  . Social connections:    Talks on phone: Not on file    Gets together: Not on file    Attends religious service: Not on file    Active member of club or organization: Not on file  Attends meetings of clubs or organizations: Not on file    Relationship status: Not on file  . Intimate partner violence:    Fear of current or ex partner: Not on file    Emotionally abused: Not on file    Physically abused: Not on file    Forced sexual activity: Not on file  Other Topics Concern  . Not on file  Social History Narrative   Patient lives at home with spouseSharman Crate)   Patient is retired.   Patient has a college education.   Patient has one adult child.   Patient is right-handed   Caffeine Use: 1 cup daily    Review of Systems: ALL ROS discussed and all others negative except listed in HPI.  Physical Exam: General: in no acute distress Neuro: Alert and appropriate Psych: Normal affect and normal insight   Jupiter Kabir L. Tarri Glenn, MD, MPH [facility] 11/08/2018, 8:55 AM

## 2018-11-08 NOTE — Patient Instructions (Signed)
-   Please take the Cipro and Flagyl for the next 10 days - Follow a high fiber diet or use fiber supplements on a regular basis. - There is no need to avoid seeds, nuts, corn, and berries. - Non-steroidal antiinflammatory medications (such as ibuprofen, naproxen, etc) may increase your risk for a recurrent of diverticulitis. Please avoid these medications whenever possible.  - Follow-up in two weeks, or earlier as needed  Thank you for your patience with me and our technology today! Please stay home, safe, and healthy. I look forward to meeting you in person in the future.

## 2018-11-22 ENCOUNTER — Encounter: Payer: Self-pay | Admitting: Gastroenterology

## 2018-11-22 ENCOUNTER — Other Ambulatory Visit: Payer: Self-pay

## 2018-11-22 ENCOUNTER — Ambulatory Visit (INDEPENDENT_AMBULATORY_CARE_PROVIDER_SITE_OTHER): Payer: Medicare Other | Admitting: Gastroenterology

## 2018-11-22 VITALS — Wt 122.0 lb

## 2018-11-22 DIAGNOSIS — R933 Abnormal findings on diagnostic imaging of other parts of digestive tract: Secondary | ICD-10-CM

## 2018-11-22 DIAGNOSIS — K5792 Diverticulitis of intestine, part unspecified, without perforation or abscess without bleeding: Secondary | ICD-10-CM | POA: Diagnosis not present

## 2018-11-22 DIAGNOSIS — R1084 Generalized abdominal pain: Secondary | ICD-10-CM

## 2018-11-22 NOTE — Patient Instructions (Addendum)
Desiree will call you to schedule a follow-up CT scan.  Please come by our office to have labwork done at your conveinence, our lab is open from 8 am -4 pm. You do not need to fast for these test.   We may need to consider a colonoscopy earlier than 4 weeks from now (my prior recommendation). I will let you know after reviewing the CT scan.  Please avoid all antiinflammatory medications as they can make diverticulitis worse. I recommend that you use the Tramadol that Dr. Jenny Reichmann prescribed if you are having pain that needs treatment.  Please call with any additional questions or concerns.

## 2018-11-22 NOTE — Progress Notes (Addendum)
TELEHEALTH VISIT  Referring Provider: Biagio Borg, MD Primary Care Physician:  Biagio Borg, MD   Tele-visit due to COVID-19 pandemic Patient requested visit virtually, consented to the virtual encounter via audio enabled telemedicine application Contact made at: 14:02 11/22/18 Patient verified by name and date of birth Location of patient: Home Location provider: [facility] Names of persons participating: Me, patient, Tinnie Gens CMA Time spent on telehealth visit: 22 minutes I discussed the limitations of evaluation and management by telemedicine. The patient expressed understanding and agreed to proceed.  Reason for Consultation:  LLQ pain  IMPRESSION:  Abdominal CT scan 11/02/18: Sigmoid diverticulitis versus sigmoid mass    -  irregular wall thickening and edema involving a 7cm segment of the sigmoid colon LLQ pain 20 pound unintentional weight loss Family history of colon cancer (sister in her 71s) History of incomplete screening colonoscopy 10/28/10 [name])    - unable to pass beyond 30 cm due to extensive diverticulosis, sigmoid polyp seen    - barium enema 10/28/10 showed sigmoid diverticulosis    - flex sig 12/01/10: sigmoid hyperplastic polyp    - colonoscopy 11/02/2003: pancolonic diverticulosis, most pronounced in the sigmoid colon  Will repeat the CT scan now to evaluate for persistent diverticulitis or complications related to diverticulitis. Although symptoms could be consistent with mesenteric ischemia, her recent CT suggested diverticulitis versus a mass. I am worried about malignancy given her family history of colon cancer and her recent weight loss.   Plan colonoscopy in 4 weeks, earlier if necessary after reviewing the CT scan.   PLAN: CBC with diff Avoid NSAIDs Continue with Tramadol PRN as prescribed by Dr. Jenny Reichmann Consider CT of the abd/pelvis with contrast Colonoscopy in 4 weeks  I consented the patient discussing the risks, benefits,  and alternatives to endoscopic evaluation. In particular, we discussed the risks that include, but are not limited to, reaction to medication, cardiopulmonary compromise, bleeding requiring blood transfusion, aspiration resulting in pneumonia, perforation requiring surgery, lack of diagnosis, severe illness requiring hospitalization, and even death. We reviewed the risk of missed lesion including polyps or even cancer. The patient acknowledges these risks and asks that we proceed.   HPI: Theresa Arnold is a 85 y.o. female following up for LLQ pain and a recent diagnosis of diverticulitis with an abnormal CT. The interval history is obtained through the patient and review of her electronic health record.   She completed 10 days of Cipro 500 mg BID and Flagyl 250 TID for possible sigmoid diverticulitis with the last dose 11/18/18. No significant change in her symptoms on treatment.  Continues to have abdominal pain and significant gas and flatus. Pain is described as a near constant generalized abdominal pain. If she pushes on her abdomen it will feel worse in the LLQ.  Pain has been worse over the last few days.  Pain present prior to eating, but worse with eating. Relieved with defecation, enough that she sits on the toilet to obtain relief at least 3-4 times daily.  She has lost almost 20 pounds since her symptoms started. + Sitophobia. Appetite is normal. No fevers, chills, or night sweats. No blood or mucous. No history of similar symptoms.   Prior endoscopic evaluation: Incomplete colonoscopy 10/28/10: sigmoid polyp not removed, unable to pass beyond 30 cm due to extensive diverticulosis Flex sig 12/01/10: sigmoid hyperplastic polyp Barium enema 10/28/10: sigmoid diverticulosis Colonoscopy 11/02/2003: pancolonic diverticulosis, most pronounced in the sigmoid colon    Past Medical History:  Diagnosis Date  . CERVICAL RADICULOPATHY, LEFT 09/03/2010  . Cervical spondylosis 11/10/2011  . COPD (chronic  obstructive pulmonary disease) ([place]) 11/10/2011  . HYPERLIPIDEMIA 09/03/2010    Past Surgical History:  Procedure Laterality Date  . COLONOSCOPY      Current Outpatient Medications  Medication Sig Dispense Refill  . aspirin 325 MG EC tablet Take 325 mg by mouth daily.      Marland Kitchen dicyclomine (BENTYL) 10 MG capsule TAKE 1 CAPSULE (10 MG TOTAL) BY MOUTH 3 (THREE) TIMES DAILY AS NEEDED FOR SPASMS. (Patient not taking: Reported on 11/22/2018) 270 capsule 1  . ondansetron (ZOFRAN) 4 MG tablet Take 1 tablet (4 mg total) by mouth every 8 (eight) hours as needed for nausea or vomiting. (Patient not taking: Reported on 11/22/2018) 30 tablet 0  . traMADol (ULTRAM) 50 MG tablet Take 1 tablet (50 mg total) by mouth every 6 (six) hours as needed. (Patient not taking: Reported on 11/22/2018) 30 tablet 0   No current facility-administered medications for this visit.     Allergies as of 11/22/2018  . (No Known Allergies)    Family History  Problem Relation Age of Onset  . Colon cancer Sister   . Breast cancer Sister   . Heart disease Father   . Hip fracture Mother   . Hypertension Other     Social History   Socioeconomic History  . Marital status: Married    Spouse name: Ernie  . Number of children: 1  . Years of education: 60  . Highest education level: Not on file  Occupational History  . Occupation: retired  Scientific laboratory technician  . Financial resource strain: Not on file  . Food insecurity:    Worry: Not on file    Inability: Not on file  . Transportation needs:    Medical: Not on file    Non-medical: Not on file  Tobacco Use  . Smoking status: Former Smoker    Packs/day: 1.00    Years: 30.00    Pack years: 30.00    Types: Cigarettes    Last attempt to quit: 07/14/2011    Years since quitting: 7.3  . Smokeless tobacco: Never Used  . Tobacco comment: QUIT 2013   Substance and Sexual Activity  . Alcohol use: Yes    Comment: wine occasionally  . Drug use: No  . Sexual activity: Not on file   Lifestyle  . Physical activity:    Days per week: Not on file    Minutes per session: Not on file  . Stress: Not on file  Relationships  . Social connections:    Talks on phone: Not on file    Gets together: Not on file    Attends religious service: Not on file    Active member of club or organization: Not on file    Attends meetings of clubs or organizations: Not on file    Relationship status: Not on file  . Intimate partner violence:    Fear of current or ex partner: Not on file    Emotionally abused: Not on file    Physically abused: Not on file    Forced sexual activity: Not on file  Other Topics Concern  . Not on file  Social History Narrative   Patient lives at home with spouseSharman Crate)   Patient is retired.   Patient has a college education.   Patient has one adult child.   Patient is right-handed   Caffeine Use: 1 cup daily  Review of Systems: ALL ROS discussed and all others negative except listed in HPI.  Physical Exam: General: in no acute distress Neuro: Alert and appropriate Psych: Normal affect and normal insight   Jaylan Hinojosa L. Tarri Glenn, MD, MPH [facility] 11/22/2018, 11:19 AM

## 2018-11-25 ENCOUNTER — Ambulatory Visit
Admission: RE | Admit: 2018-11-25 | Discharge: 2018-11-25 | Disposition: A | Discharge status: Home / Self Care | Payer: Medicare Other | Source: Ambulatory Visit | Attending: Gastroenterology | Admitting: Gastroenterology

## 2018-11-25 ENCOUNTER — Other Ambulatory Visit: Payer: Self-pay

## 2018-11-25 DIAGNOSIS — K5792 Diverticulitis of intestine, part unspecified, without perforation or abscess without bleeding: Secondary | ICD-10-CM

## 2018-11-25 MED ORDER — IOPAMIDOL (ISOVUE-300) INJECTION 61%
100.0000 mL | Freq: Once | INTRAVENOUS | Status: AC | PRN
  Administered 2018-11-25: 100 mL via INTRAVENOUS

## 2018-11-28 ENCOUNTER — Other Ambulatory Visit: Payer: Self-pay | Admitting: *Deleted

## 2018-11-28 ENCOUNTER — Telehealth: Payer: Self-pay | Admitting: *Deleted

## 2018-11-28 DIAGNOSIS — K5792 Diverticulitis of intestine, part unspecified, without perforation or abscess without bleeding: Secondary | ICD-10-CM

## 2018-11-28 MED ORDER — METRONIDAZOLE 500 MG PO TABS: 500.0000 mg | ORAL_TABLET | Freq: Three times a day (TID) | ORAL | 0 refills | Status: AC

## 2018-11-28 MED ORDER — CIPROFLOXACIN HCL 500 MG PO TABS: 500.0000 mg | ORAL_TABLET | Freq: Two times a day (BID) | ORAL | 0 refills | Status: AC

## 2018-11-28 MED ORDER — CIPROFLOXACIN HCL 500 MG PO TABS: 500.0000 mg | ORAL_TABLET | Freq: Two times a day (BID) | ORAL | 0 refills | Status: DC

## 2018-11-28 MED ORDER — METRONIDAZOLE 500 MG PO TABS: 500.0000 mg | ORAL_TABLET | Freq: Three times a day (TID) | ORAL | 0 refills | Status: DC

## 2018-11-28 NOTE — Telephone Encounter (Signed)
I am sorry to hear that her symptoms are not improving. If she is having escalating symptoms, I recommend that she go to the ED to be considered for admission.

## 2018-11-28 NOTE — Telephone Encounter (Signed)
Notified patient of CT results. Patient reports increased pain despite administration of pain medications. Difficulty eating and having BMs. One episode of vomiting on 11/27/18 and reports she is afraid to eat. Patient really concerned about the pain and is asking about increasing the frequency of pain medications and/or changing to a different pain medication. Please advise.

## 2018-11-28 NOTE — Telephone Encounter (Signed)
Spoke with patient, notified patient that she would not have to have the abscess drained but would take prescribed antibiotics for 20 days instead of 10 days. Follow up telephone visit scheduled on 12/19/2018 at 1:30 pm as requested per Dr. Tarri Glenn after the patient has finished her course of antibiotics.   Patient also notified that if her pain continues to increase and not improve, to go to the ED for evaluation. The patient seem apprehensive about this but expressed she would go if she felt the need to.

## 2018-11-30 ENCOUNTER — Ambulatory Visit (INDEPENDENT_AMBULATORY_CARE_PROVIDER_SITE_OTHER): Payer: Medicare Other | Admitting: Internal Medicine

## 2018-11-30 DIAGNOSIS — K5792 Diverticulitis of intestine, part unspecified, without perforation or abscess without bleeding: Secondary | ICD-10-CM | POA: Diagnosis not present

## 2018-11-30 DIAGNOSIS — E785 Hyperlipidemia, unspecified: Secondary | ICD-10-CM

## 2018-11-30 DIAGNOSIS — J449 Chronic obstructive pulmonary disease, unspecified: Secondary | ICD-10-CM | POA: Diagnosis not present

## 2018-11-30 MED ORDER — TRAMADOL HCL 50 MG PO TABS: 50.0000 mg | ORAL_TABLET | Freq: Four times a day (QID) | ORAL | 2 refills | Status: DC | PRN

## 2018-11-30 NOTE — Patient Instructions (Signed)
Please continue all other medications as before, and refills have been done if requested - the tramadol  Please have the pharmacy call with any other refills you may need.  Please continue your efforts at being more active, low cholesterol diet, and weight control.  Please keep your appointments with your specialists as you may have planned  Please return in 6 months, or sooner if needed

## 2018-11-30 NOTE — Progress Notes (Deleted)
Cumulative time during 7-day interval {}, there was not an associated office visit for this concern within a 7 day period.  Verbal consent for services obtained from patient prior to services given.  Names of all persons present for services: Cathlean Cower, MD, {}  Chief complaint: {}  History, background, results pertinent:     Past Medical History:  Diagnosis Date  . CERVICAL RADICULOPATHY, LEFT 09/03/2010  . Cervical spondylosis 11/10/2011  . COPD (chronic obstructive pulmonary disease) ([place]) 11/10/2011  . HYPERLIPIDEMIA 09/03/2010   No results found for this or any previous visit (from the past 48 hour(s)).    A/P/next steps: {}

## 2018-12-02 ENCOUNTER — Ambulatory Visit: Admit: 2018-12-02 | Payer: Medicare Other

## 2018-12-03 ENCOUNTER — Encounter: Payer: Self-pay | Admitting: Internal Medicine

## 2018-12-03 NOTE — Assessment & Plan Note (Signed)
See notes

## 2018-12-03 NOTE — Assessment & Plan Note (Signed)
stable overall by history and exam, recent data reviewed with pt, and pt to continue medical treatment as before,  to f/u any worsening symptoms or concerns  

## 2018-12-03 NOTE — Progress Notes (Signed)
Patient ID: NIASIA LANPHEAR, female   DOB: 01-03-1932, 85 y.o.   MRN: [id number]  Cumulative time during 7-day interval 18 min, there was not an associated office visit for this concern within a 7 day period.  Verbal consent for services obtained from patient prior to services given.  Names of all persons present for services: Cathlean Cower, MD, patient with husband present in background  Chief complaint: general medical follow up  History, background, results pertinent:  Here to f/u recent diverticulitis, already some improvement in pain and feverish, nausea improved.  Denies worsening reflux, abd pain, dysphagia, n/v, bowel change or blood.  Pt denies chest pain, increased sob or doe, wheezing, orthopnea, PND, increased LE swelling, palpitations, dizziness or syncope.  Pt denies new neurological symptoms such as new headache, or facial or extremity weakness or numbness   Pt denies polydipsia, polyuria Past Medical History:  Diagnosis Date  . CERVICAL RADICULOPATHY, LEFT 09/03/2010  . Cervical spondylosis 11/10/2011  . COPD (chronic obstructive pulmonary disease) ([place]) 11/10/2011  . HYPERLIPIDEMIA 09/03/2010    Current Outpatient Medications on File Prior to Visit  Medication Sig Dispense Refill  . aspirin 325 MG EC tablet Take 325 mg by mouth daily.      . ciprofloxacin (CIPRO) 500 MG tablet Take 1 tablet (500 mg total) by mouth every 12 (twelve) hours for 20 days. 40 tablet 0  . dicyclomine (BENTYL) 10 MG capsule TAKE 1 CAPSULE (10 MG TOTAL) BY MOUTH 3 (THREE) TIMES DAILY AS NEEDED FOR SPASMS. (Patient not taking: Reported on 11/22/2018) 270 capsule 1  . metroNIDAZOLE (FLAGYL) 500 MG tablet Take 1 tablet (500 mg total) by mouth every 8 (eight) hours for 20 days. 60 tablet 0  . ondansetron (ZOFRAN) 4 MG tablet Take 1 tablet (4 mg total) by mouth every 8 (eight) hours as needed for nausea or vomiting. (Patient not taking: Reported on 11/22/2018) 30 tablet 0   No current facility-administered  medications on file prior to visit.    Lab Results  Component Value Date   WBC 8.0 11/02/2018   HGB 14.7 11/02/2018   HCT 44.2 11/02/2018   PLT 156.0 11/02/2018   GLUCOSE 80 11/02/2018   CHOL 209 (H) 11/24/2017   TRIG 73.0 11/24/2017   HDL 76.30 11/24/2017   LDLDIRECT 142.7 11/10/2011   LDLCALC 118 (H) 11/24/2017   ALT 6 11/02/2018   AST 18 11/02/2018   NA 140 11/02/2018   K 3.9 11/02/2018   CL 101 11/02/2018   CREATININE 0.53 11/02/2018   BUN 14 11/02/2018   CO2 28 11/02/2018   TSH 0.75 11/24/2017   INR 0.97 04/18/2018    A/P/next steps: Acute diverticulitis - improving, to finish antibiotic per Dr Tarri Glenn.   to f/u any worsening symptoms or concerns  COPD - stable  HLD - asked to come for labs at her convenience, cont low chol diet Cathlean Cower, MD

## 2018-12-15 ENCOUNTER — Telehealth: Payer: Self-pay | Admitting: Gastroenterology

## 2018-12-15 NOTE — Telephone Encounter (Signed)
Patient would a call back said she is sick to the stomach and cannot stand the pain. Would like o speak to someone before her appt on Monday.

## 2018-12-15 NOTE — Telephone Encounter (Signed)
Spoke to the patient who reports continued abdominal pain and new nausea (patient presumes from the antibiotic therapy). The patient wanted a "5 minute" phone call from Dr. Tarri Glenn today, this RN explained to the patient (and the patient's husband who was also on the phone) that Dr. Tarri Glenn was doing procedures all day and more than likely would not be able to call so she would have to wait until her scheduled appt on this upcoming Monday. This RN referred to the previous note and told the patient is she continued to have abd pain to go the ED for evaluation. The pt again felt apprehensive about this. This RN explained that acute pain could be treated at the hospital, that nothing could be prescribed by Dr. Tarri Glenn for abd pain. Per Dr. Tarri Glenn note on 5/12, colonoscopy was to be scheduled after review of CT (abscess was discovered), please advise on if this changes when or if the colon needs to be scheduled.

## 2018-12-15 NOTE — Telephone Encounter (Signed)
I would be happy to move her audio appointment to 8am tomorrow if that would be more helpful to her.

## 2018-12-15 NOTE — Telephone Encounter (Signed)
Patient 6/8 appt rescheduled for 8:00 am on 6/5. Patient aware and appreciative Dr. Tarri Glenn accommodations.

## 2018-12-16 ENCOUNTER — Encounter: Payer: Self-pay | Admitting: Gastroenterology

## 2018-12-16 ENCOUNTER — Other Ambulatory Visit (INDEPENDENT_AMBULATORY_CARE_PROVIDER_SITE_OTHER): Payer: Medicare Other

## 2018-12-16 ENCOUNTER — Ambulatory Visit (INDEPENDENT_AMBULATORY_CARE_PROVIDER_SITE_OTHER): Payer: Medicare Other | Admitting: Gastroenterology

## 2018-12-16 ENCOUNTER — Other Ambulatory Visit: Payer: Self-pay

## 2018-12-16 DIAGNOSIS — R933 Abnormal findings on diagnostic imaging of other parts of digestive tract: Secondary | ICD-10-CM

## 2018-12-16 DIAGNOSIS — K5732 Diverticulitis of large intestine without perforation or abscess without bleeding: Secondary | ICD-10-CM | POA: Diagnosis not present

## 2018-12-16 DIAGNOSIS — K5720 Diverticulitis of large intestine with perforation and abscess without bleeding: Secondary | ICD-10-CM

## 2018-12-16 LAB — CBC WITH DIFFERENTIAL/PLATELET
Basophils Absolute: 0.1 10*3/uL (ref 0.0–0.1)
Basophils Relative: 1.1 % (ref 0.0–3.0)
Eosinophils Absolute: 0.1 10*3/uL (ref 0.0–0.7)
Eosinophils Relative: 1.1 % (ref 0.0–5.0)
HCT: 39.9 % (ref 36.0–46.0)
Hemoglobin: 13.4 g/dL (ref 12.0–15.0)
Lymphocytes Relative: 32.8 % (ref 12.0–46.0)
Lymphs Abs: 2.1 10*3/uL (ref 0.7–4.0)
MCHC: 33.6 g/dL (ref 30.0–36.0)
MCV: 100.9 fl — ABNORMAL HIGH (ref 78.0–100.0)
Monocytes Absolute: 0.5 10*3/uL (ref 0.1–1.0)
Monocytes Relative: 7.9 % (ref 3.0–12.0)
Neutro Abs: 3.6 10*3/uL (ref 1.4–7.7)
Neutrophils Relative %: 57.1 % (ref 43.0–77.0)
Platelets: 219 10*3/uL (ref 150.0–400.0)
RBC: 3.95 Mil/uL (ref 3.87–5.11)
RDW: 13.7 % (ref 11.5–15.5)
WBC: 6.4 10*3/uL (ref 4.0–10.5)

## 2018-12-16 NOTE — Patient Instructions (Signed)
I am recommending some blood work at [facility].  Please avoid all antiinflammatory medications.  Continue to use Tramadol as needed to control any ongoing pain.  I have asked Desiree to call you to schedule a CT scan (hopefully for next week). I have asked her not to schedule it on Tuesday due to your scheduling conflicts.  After the CT scan, a colonoscopy may be necessary.  Please call me with any questions or concerns in the meantime.

## 2018-12-16 NOTE — Progress Notes (Signed)
TELEHEALTH VISIT  Referring Provider: Biagio Borg, MD Primary Care Physician:  Biagio Borg, MD   Tele-visit due to COVID-19 pandemic Patient requested visit virtually, consented to the virtual encounter via audio enabled telemedicine application (Doxim[employer]) Contact made at: 08:00 12/16/18 Patient verified by name and date of birth Location of patient: Home Location provider: [facility] Names of persons participating: Me, patient, Tinnie Gens CMA Time spent on telehealth visit: 21 minutes I discussed the limitations of evaluation and management by telemedicine. The patient expressed understanding and agreed to proceed.  Reason for Consultation:  LLQ pain  IMPRESSION:  Abnormal CT scan    - 11/02/18: irregular wall thickening and edema involving a 7cm segment of the sigmoid colon (diverticulitis versus mass)    - treated with Cipro 500 mg BID and Flagyl 500mg  TID x 10 days    - 11/25/18: sigmoid diverticulitis with 4.0 x 1.9 x 0.8 cm abscess - not amenable to draining per IR    - treated with another course of Septra DS BID and Flagyl 500mg  TID x 8 days    - did not complete antibiotics due to nausea LLQ pain 15-20 pound unintentional weight loss Family history of colon cancer (sister in her 61s) History of incomplete screening colonoscopy 10/28/10 [name])    - unable to pass beyond 30 cm due to extensive diverticulosis, sigmoid polyp seen    - barium enema 10/28/10 showed sigmoid diverticulosis    - flex sig 12/01/10: sigmoid hyperplastic polyp    - colonoscopy 11/02/2003: pancolonic diverticulosis, most pronounced in the sigmoid colon  Repeat CT scan showed persistent sigmoid diverticulitis with the development of 4.0 x 1.9 x 0.8 cm asbcess. I personally reviewed the CT images and discussed the findings with IR who id not feel that it was amenable to draining.   Will plan repeat contrasted CT scan at this time. Planned colonoscopy after CT scan if appropriate. If  abscess has not improved, may need to consider IV antibiotics.    PLAN: CBC with diff Avoid NSAIDs Continue with Tramadol PRN as initially prescribed by Dr. Jenny Reichmann (refilled today) CT of the abd/pelvis with contrast to follow-up on diverticular abscess (not on a Tuesday per patient request) Colonoscopy after reviewing the CT scan  I consented the patient discussing the risks, benefits, and alternatives to endoscopic evaluation. In particular, we discussed the risks that include, but are not limited to, reaction to medication, cardiopulmonary compromise, bleeding requiring blood transfusion, aspiration resulting in pneumonia, perforation requiring surgery, lack of diagnosis, severe illness requiring hospitalization, and even death. We reviewed the risk of missed lesion including polyps or even cancer. The patient acknowledges these risks and asks that we proceed.   HPI: Theresa Arnold is a 85 y.o. female following up for LLQ pain and an abnormal CT scan. The interval history is obtained through the patient and review of her electronic health record. She had a scheduled follow-up appointment next week but asked to be seen earlier do to ongoing nausea.   She had a CT scan 11/25/18 showing ongoing diverticulitis with an associated abscess. She started another course of Cipro 500 mg BID and Flagyl 500mg  mg TID. She would have completed the second course of antibiotics yesterday because she discontinued them early. She was feeling extremely nauseated and  continued to have abdominal pain and felt this was related to the medications. She stopped the antibiotics yesterday and is already feeling better. She has had no nausea or pain since  she stopped taking the antibiotics. Slept through the night for the first time last night in some time.   She is concerned about ongoing weight loss. Appetite was slightly decreased on antibiotics due to nausea. Yesterday she weighed 120. Her baseline weight is 135.   No  fevers, chills, or night sweats. No blood or mucous. She has no new complaints or concerns today.     Prior endoscopic evaluation: Incomplete colonoscopy 10/28/10: sigmoid polyp not removed, unable to pass beyond 30 cm due to extensive diverticulosis Flex sig 12/01/10: sigmoid hyperplastic polyp Barium enema 10/28/10: sigmoid diverticulosis Colonoscopy 11/02/2003: pancolonic diverticulosis, most pronounced in the sigmoid colon    Past Medical History:  Diagnosis Date   CERVICAL RADICULOPATHY, LEFT 09/03/2010   Cervical spondylosis 11/10/2011   COPD (chronic obstructive pulmonary disease) ([place]) 11/10/2011   HYPERLIPIDEMIA 09/03/2010    Past Surgical History:  Procedure Laterality Date   COLONOSCOPY      Current Outpatient Medications  Medication Sig Dispense Refill   aspirin 325 MG EC tablet Take 325 mg by mouth daily.       ciprofloxacin (CIPRO) 500 MG tablet Take 1 tablet (500 mg total) by mouth every 12 (twelve) hours for 20 days. 40 tablet 0   dicyclomine (BENTYL) 10 MG capsule TAKE 1 CAPSULE (10 MG TOTAL) BY MOUTH 3 (THREE) TIMES DAILY AS NEEDED FOR SPASMS. (Patient not taking: Reported on 11/22/2018) 270 capsule 1   metroNIDAZOLE (FLAGYL) 500 MG tablet Take 1 tablet (500 mg total) by mouth every 8 (eight) hours for 20 days. 60 tablet 0   ondansetron (ZOFRAN) 4 MG tablet Take 1 tablet (4 mg total) by mouth every 8 (eight) hours as needed for nausea or vomiting. (Patient not taking: Reported on 11/22/2018) 30 tablet 0   traMADol (ULTRAM) 50 MG tablet Take 1 tablet (50 mg total) by mouth every 6 (six) hours as needed. 60 tablet 2   No current facility-administered medications for this visit.     Allergies as of 12/16/2018   (No Known Allergies)    Family History  Problem Relation Age of Onset   Colon cancer Sister    Breast cancer Sister    Heart disease Father    Hip fracture Mother    Hypertension Other     Social History   Socioeconomic History    Marital status: Married    Spouse name: Ernie   Number of children: 1   Years of education: 16   Highest education level: Not on file  Occupational History   Occupation: retired  Scientist, product/process development strain: Not on file   Food insecurity:    Worry: Not on file    Inability: Not on Lexicographer needs:    Medical: Not on file    Non-medical: Not on file  Tobacco Use   Smoking status: Former Smoker    Packs/day: 1.00    Years: 30.00    Pack years: 30.00    Types: Cigarettes    Last attempt to quit: 07/14/2011    Years since quitting: 7.4   Smokeless tobacco: Never Used   Tobacco comment: QUIT 2013   Substance and Sexual Activity   Alcohol use: Yes    Comment: wine occasionally   Drug use: No   Sexual activity: Not on file  Lifestyle   Physical activity:    Days per week: Not on file    Minutes per session: Not on file   Stress: Not on file  Relationships   Social connections:    Talks on phone: Not on file    Gets together: Not on file    Attends religious service: Not on file    Active member of club or organization: Not on file    Attends meetings of clubs or organizations: Not on file    Relationship status: Not on file   Intimate partner violence:    Fear of current or ex partner: Not on file    Emotionally abused: Not on file    Physically abused: Not on file    Forced sexual activity: Not on file  Other Topics Concern   Not on file  Social History Narrative   Patient lives at home with spouse( [place])   Patient is retired.   Patient has a college education.   Patient has one adult child.   Patient is right-handed   Caffeine Use: 1 cup daily    Physical Exam: General: in no acute distress Neuro: Alert and appropriate Psych: Normal affect and normal insight   Ashani Pumphrey L. Tarri Glenn, MD, MPH [facility] 12/16/2018, 7:57 AM

## 2018-12-19 ENCOUNTER — Ambulatory Visit: Payer: Medicare Other | Admitting: Gastroenterology

## 2018-12-19 ENCOUNTER — Other Ambulatory Visit: Payer: Medicare Other

## 2018-12-28 ENCOUNTER — Ambulatory Visit
Admission: RE | Admit: 2018-12-28 | Discharge: 2018-12-28 | Disposition: A | Discharge status: Home / Self Care | Payer: Medicare Other | Source: Ambulatory Visit | Attending: Gastroenterology | Admitting: Gastroenterology

## 2018-12-28 DIAGNOSIS — K5720 Diverticulitis of large intestine with perforation and abscess without bleeding: Secondary | ICD-10-CM | POA: Diagnosis not present

## 2018-12-28 DIAGNOSIS — R933 Abnormal findings on diagnostic imaging of other parts of digestive tract: Secondary | ICD-10-CM

## 2018-12-28 DIAGNOSIS — K5730 Diverticulosis of large intestine without perforation or abscess without bleeding: Secondary | ICD-10-CM | POA: Diagnosis not present

## 2018-12-28 MED ORDER — IOPAMIDOL (ISOVUE-300) INJECTION 61%
100.0000 mL | Freq: Once | INTRAVENOUS | Status: AC | PRN
  Administered 2018-12-28: 100 mL via INTRAVENOUS

## 2018-12-30 ENCOUNTER — Telehealth: Payer: Self-pay | Admitting: *Deleted

## 2018-12-30 NOTE — Telephone Encounter (Signed)
Spoke to the patient (the patient's husband also joined the phone call), notified the patient of the results in which did not satisfy her and prompted many questions. "Well what does this mean?" "What are the next steps?" "A colonoscopy, she never mentioned that to [place]." "She never calls [place], you always call and you can't help." The patient was reminded by this RN of all of the phone calls that she had with Dr. Tarri Glenn that have been documented. After this reminder, the patient said she vaguely remembered. Then the husband proceeded to ask what I was going to change concerning the "game plan because this plan isn't working." This RN explained to the husband that the plan has not been fully implemented. After trying to explain what had been spoken about in previous notes, with the continued questions and forgetfulness, this RN asked if they would like another telephone visit with Dr. Tarri Glenn because this seemed to be the best course of action. Appt scheduled for 7/10 at 2:30 pm.   The patient finally said when asked how she was doing that she'd been dealing with constipation. The pain that she was feeling has decreased and is not as intense. She has had to use a suppository twice in the last 2 weeks. The first time it produced a BM, the second time no BM was produced (about a week later). She also reports 10 lb weight loss in 2 months. Nothing further at the time of the call.

## 2019-01-03 NOTE — Telephone Encounter (Signed)
Noted. Thanks.

## 2019-01-03 NOTE — Telephone Encounter (Signed)
Thank you. I would be happy to see her earlier if a virtual appointment would be okay. Thank you.

## 2019-01-03 NOTE — Telephone Encounter (Signed)
This patient has been scheduled for an earlier appt by [facility] for an in person office visit on 01/06/2019 at 11:30 am. This may be better for the patient as the patient sometimes forgets the content of the conversation.

## 2019-01-05 ENCOUNTER — Telehealth: Payer: Self-pay | Admitting: Emergency Medicine

## 2019-01-05 NOTE — Telephone Encounter (Signed)
Covid-19 screening questions   Do you now or have you had a fever in the last 14 days no   Do you have any respiratory symptoms of shortness of breath or cough now or in the last 14 days no  Do you have any family members or close contacts with diagnosed or suspected Covid-19 in the past 14 days no  Have you been tested for Covid-19 and found to be positive no          

## 2019-01-06 ENCOUNTER — Encounter: Payer: Self-pay | Admitting: Gastroenterology

## 2019-01-06 ENCOUNTER — Ambulatory Visit (INDEPENDENT_AMBULATORY_CARE_PROVIDER_SITE_OTHER): Payer: Medicare Other | Admitting: Gastroenterology

## 2019-01-06 ENCOUNTER — Telehealth: Payer: Self-pay | Admitting: Gastroenterology

## 2019-01-06 VITALS — BP 130/80 | HR 64 | Ht 62.0 in | Wt 114.0 lb

## 2019-01-06 DIAGNOSIS — K5720 Diverticulitis of large intestine with perforation and abscess without bleeding: Secondary | ICD-10-CM

## 2019-01-06 DIAGNOSIS — R634 Abnormal weight loss: Secondary | ICD-10-CM | POA: Diagnosis not present

## 2019-01-06 DIAGNOSIS — R933 Abnormal findings on diagnostic imaging of other parts of digestive tract: Secondary | ICD-10-CM | POA: Diagnosis not present

## 2019-01-06 MED ORDER — TRAMADOL HCL 50 MG PO TABS: 50.0000 mg | ORAL_TABLET | Freq: Four times a day (QID) | ORAL | 2 refills | Status: DC | PRN

## 2019-01-06 NOTE — Progress Notes (Signed)
TELEHEALTH VISIT  Referring Provider: Biagio Borg, MD Primary Care Physician:  Biagio Borg, MD   Reason for Consultation:  LLQ pain  IMPRESSION:  Abnormal CT scan    - 11/02/18: irregular wall thickening and edema involving a 7cm segment of the sigmoid colon (diverticulitis versus mass)    - treated with Cipro 500 mg BID and Flagyl 500mg  TID x 10 days    - 11/25/18: sigmoid diverticulitis with 4.0 x 1.9 x 0.8 cm abscess - not amenable to draining per IR    - treated with another course of Septra DS BID and Flagyl 500mg  TID x 8 days    - did not complete antibiotics due to nausea    - 12/28/18: 3.6 x 1.2 cm abscess - essentially unchanged - not amenable to draining per IR, no residual sigmoid inflammatory changes LLQ pain 15-20 pound unintentional weight loss Family history of colon cancer (sister in her 1s) History of incomplete screening colonoscopy 10/28/10 [name])    - unable to pass beyond 30 cm due to extensive diverticulosis, sigmoid polyp seen    - barium enema 10/28/10 showed sigmoid diverticulosis    - flex sig 12/01/10: sigmoid hyperplastic polyp    - colonoscopy 11/02/2003: pancolonic diverticulosis, most pronounced in the sigmoid colon  Repeat CT scan shows persistent sigmoid intramural asbcess with resolution of inflammatory changes after two rounds of antibiotics for presumed diverticulitis. . I personally reviewed the new CT images and discussed the findings with IR who id not feel that it was amenable to draining.  The radiologist suggested the possibility of a colo-colo-fistula versus persistent intramural abscess.  Although it is unclear if she is a surgical candidate, referral to surgery is indicated.  May need a colonoscopy but I wanted to wait until she established with a surgeon to make a decision given the potential risk for complication with endoscopy.    PLAN: Continue with Tramadol PRN as initially prescribed by Dr. Jenny Reichmann (refilled today) Referral to surgery  Colonoscopy may be necessary after surgical consultation    HPI: Theresa Arnold is a 85 y.o. female following up for LLQ pain and an abnormal CT scan. The interval history is obtained through the patient and review of her electronic health record.   Continues to have near constant, severe LLQ and mid-abdominal pain that often radiates throughout her abdomen. Initially started in March. Associated constipation. Has an urge to defecate but is unsuccessful. No blood or mucous when she has a bowel movement. Exacerbated by eating but not caused by eating. Some sitophobia.Tramadol provides some relief.   She remains concerned about ongoing weight loss. Weight today is 114 pounds. Her baseline weight is 135.   No fevers, chills, or night sweats. No blood or mucous. She has no new complaints or concerns today.    Prior endoscopic evaluation: Incomplete colonoscopy 10/28/10: sigmoid polyp not removed, unable to pass beyond 30 cm due to extensive diverticulosis Flex sig 12/01/10: sigmoid hyperplastic polyp Barium enema 10/28/10: sigmoid diverticulosis Colonoscopy 11/02/2003: pancolonic diverticulosis, most pronounced in the sigmoid colon    Past Medical History:  Diagnosis Date  . CERVICAL RADICULOPATHY, LEFT 09/03/2010  . Cervical spondylosis 11/10/2011  . COPD (chronic obstructive pulmonary disease) ([place]) 11/10/2011  . HYPERLIPIDEMIA 09/03/2010    Past Surgical History:  Procedure Laterality Date  . COLONOSCOPY      Current Outpatient Medications  Medication Sig Dispense Refill  . aspirin 325 MG EC tablet Take 325 mg by mouth daily.      Marland Kitchen  dicyclomine (BENTYL) 10 MG capsule TAKE 1 CAPSULE (10 MG TOTAL) BY MOUTH 3 (THREE) TIMES DAILY AS NEEDED FOR SPASMS. (Patient not taking: Reported on 11/22/2018) 270 capsule 1  . ondansetron (ZOFRAN) 4 MG tablet Take 1 tablet (4 mg total) by mouth every 8 (eight) hours as needed for nausea or vomiting. (Patient not taking: Reported on 11/22/2018) 30 tablet 0   . traMADol (ULTRAM) 50 MG tablet Take 1 tablet (50 mg total) by mouth every 6 (six) hours as needed. 60 tablet 2   No current facility-administered medications for this visit.     Allergies as of 01/06/2019  . (No Known Allergies)    Family History  Problem Relation Age of Onset  . Colon cancer Sister   . Breast cancer Sister   . Heart disease Father   . Hip fracture Mother   . Hypertension Other     Social History   Socioeconomic History  . Marital status: Married    Spouse name: Ernie  . Number of children: 1  . Years of education: 53  . Highest education level: Not on file  Occupational History  . Occupation: retired  Scientific laboratory technician  . Financial resource strain: Not on file  . Food insecurity    Worry: Not on file    Inability: Not on file  . Transportation needs    Medical: Not on file    Non-medical: Not on file  Tobacco Use  . Smoking status: Former Smoker    Packs/day: 1.00    Years: 30.00    Pack years: 30.00    Types: Cigarettes    Quit date: 07/14/2011    Years since quitting: 7.4  . Smokeless tobacco: Never Used  . Tobacco comment: QUIT 2013   Substance and Sexual Activity  . Alcohol use: Yes    Comment: wine occasionally  . Drug use: No  . Sexual activity: Not on file  Lifestyle  . Physical activity    Days per week: Not on file    Minutes per session: Not on file  . Stress: Not on file  Relationships  . Social Herbalist on phone: Not on file    Gets together: Not on file    Attends religious service: Not on file    Active member of club or organization: Not on file    Attends meetings of clubs or organizations: Not on file    Relationship status: Not on file  . Intimate partner violence    Fear of current or ex partner: Not on file    Emotionally abused: Not on file    Physically abused: Not on file    Forced sexual activity: Not on file  Other Topics Concern  . Not on file  Social History Narrative   Patient lives at home  with spouseSharman Crate)   Patient is retired.   Patient has a college education.   Patient has one adult child.   Patient is right-handed   Caffeine Use: 1 cup daily    Physical Exam: General: in no acute distress Neuro: Alert and appropriate Psych: Normal affect and normal insight   Desirai Traxler L. Tarri Glenn, MD, MPH [facility] 01/06/2019, 12:26 PM

## 2019-01-06 NOTE — Telephone Encounter (Signed)
Rx has been faxed to pharmacy.

## 2019-01-06 NOTE — Patient Instructions (Signed)
You have been scheduled for an appointment with Dr. Dema Severin at [facility].  Make certain to bring a list of current medications, including any over the counter medications or vitamins. Also bring your co-pay if you have one as well as your insurance cards. [facility] is located at [address], [address]. Should you need to reschedule your appointment, please contact them at [phone].

## 2019-01-09 ENCOUNTER — Telehealth: Payer: Self-pay | Admitting: Gastroenterology

## 2019-01-09 NOTE — Telephone Encounter (Signed)
Surgical referral faxed to [facility].

## 2019-01-20 ENCOUNTER — Ambulatory Visit: Payer: Medicare Other | Admitting: Gastroenterology

## 2019-01-24 ENCOUNTER — Telehealth: Payer: Self-pay | Admitting: Emergency Medicine

## 2019-01-24 DIAGNOSIS — Z8719 Personal history of other diseases of the digestive system: Secondary | ICD-10-CM | POA: Diagnosis not present

## 2019-01-24 DIAGNOSIS — K5720 Diverticulitis of large intestine with perforation and abscess without bleeding: Secondary | ICD-10-CM

## 2019-01-24 DIAGNOSIS — R933 Abnormal findings on diagnostic imaging of other parts of digestive tract: Secondary | ICD-10-CM

## 2019-01-24 MED ORDER — PLENVU 140 G PO SOLR: 1.0000 | ORAL | 0 refills | Status: DC

## 2019-01-24 NOTE — Telephone Encounter (Signed)
-----  Message from Thornton Park, MD sent at 01/24/2019 11:26 AM EDT ----- Regarding: RE: Colonoscopy Dr. Dema Severin,  Thanks for seeing her! I was ready to proceed with colonoscopy once you had met. Thank you. I will ask my staff to set it up.  Joelene Millin  ----- Message ----- From: Ileana Roup, MD Sent: 01/24/2019  11:02 AM EDT To: Thornton Park, MD Subject: Colonoscopy                                    Hey Dr. Tarri Glenn - I saw Ms. 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 It looks like she does have a hx of polyps and would be due for repeat as is. I was wondering if you'd be willing to see - it looks like last go round the plans were for pedi scope and glucagon on reattempt. I have told her to start taking miralax daily and see how her sxs are; if they are no better, plans for sigmoidectomy assuming cleared by her pcp. Let me know what you think. I appreciate your help with her!  Annye English

## 2019-01-24 NOTE — Telephone Encounter (Signed)
Patient agrees to proceed with colonoscopy, she will have her husband come by the office and pick up the prep and the instructions.

## 2019-01-26 ENCOUNTER — Telehealth: Payer: Self-pay | Admitting: Gastroenterology

## 2019-01-26 NOTE — Telephone Encounter (Signed)

## 2019-01-26 NOTE — Telephone Encounter (Signed)
Pt responded "no" to all screening questions °

## 2019-01-27 ENCOUNTER — Other Ambulatory Visit: Payer: Self-pay

## 2019-01-27 ENCOUNTER — Ambulatory Visit (AMBULATORY_SURGERY_CENTER): Payer: Medicare Other | Admitting: Gastroenterology

## 2019-01-27 ENCOUNTER — Encounter: Payer: Self-pay | Admitting: Gastroenterology

## 2019-01-27 VITALS — BP 102/73 | HR 72 | Temp 98.3°F | Resp 12 | Ht 62.0 in | Wt 114.0 lb

## 2019-01-27 DIAGNOSIS — K5720 Diverticulitis of large intestine with perforation and abscess without bleeding: Secondary | ICD-10-CM

## 2019-01-27 DIAGNOSIS — Z1211 Encounter for screening for malignant neoplasm of colon: Secondary | ICD-10-CM | POA: Diagnosis not present

## 2019-01-27 DIAGNOSIS — R933 Abnormal findings on diagnostic imaging of other parts of digestive tract: Secondary | ICD-10-CM

## 2019-01-27 MED ORDER — SODIUM CHLORIDE 0.9 % IV SOLN: 500.0000 mL | Freq: Once | INTRAVENOUS | Status: DC

## 2019-01-27 NOTE — Op Note (Addendum)
[facility] Patient Name: Theresa Arnold Procedure Date: 01/27/2019 1:19 PM MRN: [id number] Endoscopist: Thornton Park MD, MD Age: 85 Referring MD:  Date of Birth: [birth year] Gender: Female Account #: [id number] Procedure:                Colonoscopy Indications:              Abnormal CT of the GI tract                           - 11/02/18: irregular wall thickening and edema                            involving a 7cm segment of the sigmoid colon                            (diverticulitis versus mass)                           - treated with Cipro 500 mg BID and Flagyl 500mg                             TID x 10 days                           - 11/25/18: sigmoid diverticulitis with 4.0 x 1.9 x                            0.8 cm abscess - not amenable to draining per IR                           - treated with another course of Septra DS BID and                            Flagyl 500mg  TID x 8 days                           - did not complete antibiotics due to nausea                           - 12/28/18: 3.6 x 1.2 cm abscess - essentially                            unchanged - not amenable to draining per IR, no                            residual sigmoid inflammatory changes                           LLQ pain                           15-20 pound unintentional weight loss                           Family history of  colon cancer (sister in her 103s)                           History of incomplete screening colonoscopy 10/28/10                            [name])                           - unable to pass beyond 30 cm due to extensive                            diverticulosis, sigmoid polyp seen                           - barium enema 10/28/10 showed sigmoid diverticulosis                           - flex sig 12/01/10: sigmoid hyperplastic polyp                           - colonoscopy 11/02/2003: pancolonic diverticulosis,                            most pronounced in the sigmoid  colon Medicines:                See the Anesthesia note for documentation of the                            administered medications Procedure:                Pre-Anesthesia Assessment:                           - Prior to the procedure, a History and Physical                            was performed, and patient medications and                            allergies were reviewed. The patient's tolerance of                            previous anesthesia was also reviewed. The risks                            and benefits of the procedure and the sedation                            options and risks were discussed with the patient.                            All questions were answered, and informed consent  was obtained. Prior Anticoagulants: The patient has                            taken no previous anticoagulant or antiplatelet                            agents. ASA Grade Assessment: II - A patient with                            mild systemic disease. After reviewing the risks                            and benefits, the patient was deemed in                            satisfactory condition to undergo the procedure.                           After obtaining informed consent, the colonoscope                            was passed under direct vision. Throughout the                            procedure, the patient's blood pressure, pulse, and                            oxygen saturations were monitored continuously. The                            Endoscope was introduced through the anus and                            advanced to the the hepatic flexure. The                            colonoscopy was technically difficult and complex                            due to inadequate bowel prep, restricted mobility                            of the colon and significant looping. Successful                            completion of the procedure was aided by changing                             the patient to a supine position, changing the                            patient to a prone position, using manual pressure,  withdrawing the scope and replacing with the adult                            endoscope and straightening and shortening the                            scope to obtain bowel loop reduction. I did not                            have enough scope to advance to the cecum given the                            long significant looping that occurred. The patient                            tolerated the procedure well. The quality of the                            bowel preparation was inadequate. Over 1.5 L of                            stool was aspirated during the procedure. The                            rectum was photographed. Scope In: 1:43:21 PM Scope Out: 2:09:02 PM Total Procedure Duration: 0 hours 25 minutes 41 seconds  Findings:                 The perianal and digital rectal examinations were                            normal. The colon was tortuous and redundant.                           A moderate stenosis measuring of unknown length was                            found in the distal sigmoid colon and was                            traversed. This area is just proximal to the                            rectosigmoid junction. The area of the colon is                            redundant and difficult to transverse due to                            extensive diverticulosis. I was unable to traverse                            this area with a pediatric colonoscope. I could  advance with a gastroscope with careful positioning                            of the patient. There is extensive diverticulosis                            in the sigmoid and descending colon.                           A large polyp was found in the distal sigmoid colon                            at the distal end  of the stenosis. The polyp could                            not safely be removed given the large volume of                            stool present.                           Copious quantities of stool was found in the entire                            colon, interfering with visualization. Fluid                            aspiration was performed. Complications:            No immediate complications. Estimated Blood Loss:     Estimated blood loss: none. Impression:               - Preparation of the colon was inadequate.                           - Stricture in the distal sigmoid colon in the area                            of diverticulosis.                           - One polyp in the distal sigmoid colon. Not                            removed due to the large about of residual stool.                           - Stool in the entire examined colon. Fluid                            aspiration performed. Recommendation:           - Patient has a contact number available for  emergencies. The signs and symptoms of potential                            delayed complications were discussed with the                            patient. Return to normal activities tomorrow.                            Written discharge instructions were provided to the                            patient.                           - Resume regular diet today.                           - Continue present medications.                           - I will review these results with Dr. Dema Severin to                            determine if surgery is an option given the                            considerable amount of pain attributed to this                            stricture. If surgery is planned, will ask that he                            removed the colon adjacent to the stricture at the                            same time. If not, will need to plan a two day prep                             and another colonoscopy for polypectomy. Thornton Park MD, MD 01/27/2019 2:19:31 PM This report has been signed electronically.

## 2019-01-27 NOTE — Patient Instructions (Signed)
YOU HAD AN ENDOSCOPIC PROCEDURE TODAY AT THE [facility]:   Refer to the procedure report that was given to you for any specific questions about what was found during the examination.  If the procedure report does not answer your questions, please call your gastroenterologist to clarify.  If you requested that your care partner not be given the details of your procedure findings, then the procedure report has been included in a sealed envelope for you to review at your convenience later.  YOU SHOULD EXPECT: Some feelings of bloating in the abdomen. Passage of more gas than usual.  Walking can help get rid of the air that was put into your GI tract during the procedure and reduce the bloating. If you had a lower endoscopy (such as a colonoscopy or flexible sigmoidoscopy) you may notice spotting of blood in your stool or on the toilet paper. If you underwent a bowel prep for your procedure, you may not have a normal bowel movement for a few days.  Please Note:  You might notice some irritation and congestion in your nose or some drainage.  This is from the oxygen used during your procedure.  There is no need for concern and it should clear up in a day or so.  SYMPTOMS TO REPORT IMMEDIATELY:   Following lower endoscopy (colonoscopy or flexible sigmoidoscopy):  Excessive amounts of blood in the stool  Significant tenderness or worsening of abdominal pains  Swelling of the abdomen that is new, acute  Fever of 100F or higher  For urgent or emergent issues, a gastroenterologist can be reached at any hour by calling [phone].   DIET:  We do recommend a small meal at first, but then you may proceed to your regular diet.  Drink plenty of fluids but you should avoid alcoholic beverages for 24 hours.  ACTIVITY:  You should plan to take it easy for the rest of today and you should NOT DRIVE or use heavy machinery until tomorrow (because of the sedation medicines used during the test).     FOLLOW UP: Our staff will call the number listed on your records 48-72 hours following your procedure to check on you and address any questions or concerns that you may have regarding the information given to you following your procedure. If we do not reach you, we will leave a message.  We will attempt to reach you two times.  During this call, we will ask if you have developed any symptoms of COVID 19. If you develop any symptoms (ie: fever, flu-like symptoms, shortness of breath, cough etc.) before then, please call [phone].  If you test positive for Covid 19 in the 2 weeks post procedure, please call and report this information to us.    If any biopsies were taken you will be contacted by phone or by letter within the next 1-3 weeks.  Please call us at [phone] if you have not heard about the biopsies in 3 weeks.    SIGNATURES/CONFIDENTIALITY: You and/or your care partner have signed paperwork which will be entered into your electronic medical record.  These signatures attest to the fact that that the information above on your After Visit Summary has been reviewed and is understood.  Full responsibility of the confidentiality of this discharge information lies with you and/or your care-partner. 

## 2019-01-27 NOTE — Progress Notes (Signed)
Patient complains of stomach pains but states it's the same pain she always has prior to procedure.  She is passing gas and has been cleared for discharge by Dr. Tarri Glenn.

## 2019-01-27 NOTE — Progress Notes (Signed)
A and O x3. Report to RN. Tolerated MAC anesthesia well.

## 2019-01-31 ENCOUNTER — Telehealth: Payer: Self-pay | Admitting: *Deleted

## 2019-01-31 NOTE — Telephone Encounter (Signed)
This pain feels different, she reports the right side of her stomach feels different from the left. The pain is no longer constant like there was before, its comes and goes suddenly. It causes nausea and the inability of eat. Nothing makes it better or worse. She states that even jello gives her abdominal pain. While on the phone with the patient, she asked this RN to hold on and started to cry out in pain. She returned to the phone and said "there goes one of those pains again." The patient was also concerned about the stool that was in her colon during the colonoscopy and asked if the physician was going to let it stay there. This RN told the patient that the stool was there because all of the prep was not taken but she would eventually release the stool during her bowel movements. Nothing further at the time of the call.

## 2019-01-31 NOTE — Telephone Encounter (Signed)
Would you please call the patient today to find out how she is feeling and if this pain is different from her constant pain that we have been evaluating? Thank you.

## 2019-01-31 NOTE — Telephone Encounter (Signed)
  Follow up Call-  Call back number 01/27/2019  Post procedure Call Back phone  # [phone]  Permission to leave phone message Yes  Some recent data might be hidden     Patient questions:  Do you have a fever, pain , or abdominal swelling? Yes.   Pain Score  8 *  Have you tolerated food without any problems? No.  Have you been able to return to your normal activities? No.  Do you have any questions about your discharge instructions: Diet   No. Medications  No. Follow up visit  No.  Do you have questions or concerns about your Care? Yes.  Patient complains of right side abdominal pain swelling since procedure.  She continues to have the same abdominal pain as she did prior to procedure but she states the swelling is new.  She also complains that she has a white coating on her tongue since drinking prep for colonoscopy.  I will forward to Dr. Tarri Glenn.   Actions: * If pain score is 4 or above: Physician/ provider Notified : Joelene Millin L. Beavers, MD.  1. Have you developed a fever since your procedure? NO  2.   Have you had an respiratory symptoms (SOB or cough) since your procedure? NO  3.   Have you tested positive for COVID 19 since your procedure NO  4.   Have you had any family members/close contacts diagnosed with the COVID 19 since your procedure?  NO   If yes to any of these questions please route to Joylene John, RN and Alphonsa Gin, RN.

## 2019-02-01 NOTE — Telephone Encounter (Signed)
Patient called back stated that her house phone is messed up please call her back at [phone]

## 2019-02-01 NOTE — Telephone Encounter (Signed)
Thank you for the update. I was on vacation yesterday. Please see how she is feeling today. I agree with your reassurances about the stool in the colon. Thank you.

## 2019-02-01 NOTE — Telephone Encounter (Signed)
Attempted to call the patient twice, no answer, unable to leave voicemail.

## 2019-02-01 NOTE — Telephone Encounter (Signed)
I recommend Miralax 17 grams daily. Please ask her to call with an update in a couple of days. Thank you.

## 2019-02-01 NOTE — Telephone Encounter (Signed)
Spoke to the patient again who reports she is the same as yesterday. She did take a stimulant laxative (biscodyl) that she reports didn't work. She wanted to know after taking the laxative it this was okay to do. The patient sounds better than she did yesterday but states she's about the same.

## 2019-02-02 NOTE — Telephone Encounter (Signed)
The patient reported that Dr. Dema Severin with [facility] also told her to start taking daily Miralax but the patient did not purchase any. She states that now because Dr. Tarri Glenn has recommended to take daily Miralax, she would take it. Nothing further at the end of the phone call.

## 2019-02-06 ENCOUNTER — Telehealth: Payer: Self-pay | Admitting: Gastroenterology

## 2019-02-06 NOTE — Telephone Encounter (Signed)
Patient called said that she would like to speak to someone because she does not know what is going to be the next step about her "infection in her colon"

## 2019-02-06 NOTE — Telephone Encounter (Signed)
Spoke to the patient after calling [facility]. The patient has a follow up appointment with Dr. Dema Severin at [facility] in September to determine next steps. The patient verbalized understanding. Nothing further at the time of the call.

## 2019-03-07 ENCOUNTER — Telehealth: Payer: Self-pay | Admitting: *Deleted

## 2019-03-07 NOTE — Telephone Encounter (Signed)
F/U with [facility], patient scheduled originally on 7/14, another appt scheduled on 9/21.

## 2019-03-08 DIAGNOSIS — L308 Other specified dermatitis: Secondary | ICD-10-CM | POA: Diagnosis not present

## 2019-03-08 DIAGNOSIS — L218 Other seborrheic dermatitis: Secondary | ICD-10-CM | POA: Diagnosis not present

## 2019-03-11 ENCOUNTER — Encounter (HOSPITAL_COMMUNITY): Payer: Self-pay

## 2019-03-11 ENCOUNTER — Other Ambulatory Visit: Payer: Self-pay

## 2019-03-11 ENCOUNTER — Emergency Department (HOSPITAL_COMMUNITY): Payer: Medicare Other

## 2019-03-11 ENCOUNTER — Emergency Department (HOSPITAL_COMMUNITY)
Admission: EM | Admit: 2019-03-11 | Discharge: 2019-03-11 | Disposition: A | Discharge status: Home / Self Care | Payer: Medicare Other | Attending: Emergency Medicine | Admitting: Emergency Medicine

## 2019-03-11 DIAGNOSIS — R52 Pain, unspecified: Secondary | ICD-10-CM | POA: Diagnosis not present

## 2019-03-11 DIAGNOSIS — Z87891 Personal history of nicotine dependence: Secondary | ICD-10-CM | POA: Diagnosis not present

## 2019-03-11 DIAGNOSIS — R1032 Left lower quadrant pain: Secondary | ICD-10-CM | POA: Diagnosis present

## 2019-03-11 DIAGNOSIS — J449 Chronic obstructive pulmonary disease, unspecified: Secondary | ICD-10-CM | POA: Diagnosis not present

## 2019-03-11 DIAGNOSIS — K5792 Diverticulitis of intestine, part unspecified, without perforation or abscess without bleeding: Secondary | ICD-10-CM

## 2019-03-11 DIAGNOSIS — Z7982 Long term (current) use of aspirin: Secondary | ICD-10-CM | POA: Diagnosis not present

## 2019-03-11 DIAGNOSIS — I10 Essential (primary) hypertension: Secondary | ICD-10-CM | POA: Diagnosis not present

## 2019-03-11 DIAGNOSIS — R110 Nausea: Secondary | ICD-10-CM | POA: Diagnosis not present

## 2019-03-11 DIAGNOSIS — R1084 Generalized abdominal pain: Secondary | ICD-10-CM | POA: Diagnosis not present

## 2019-03-11 DIAGNOSIS — K6389 Other specified diseases of intestine: Secondary | ICD-10-CM | POA: Diagnosis not present

## 2019-03-11 DIAGNOSIS — K5900 Constipation, unspecified: Secondary | ICD-10-CM | POA: Diagnosis not present

## 2019-03-11 LAB — COMPREHENSIVE METABOLIC PANEL
ALT: 7 U/L (ref 0–44)
AST: 21 U/L (ref 15–41)
Albumin: 3.2 g/dL — ABNORMAL LOW (ref 3.5–5.0)
Alkaline Phosphatase: 51 U/L (ref 38–126)
Anion gap: 12 (ref 5–15)
BUN: 6 mg/dL — ABNORMAL LOW (ref 8–23)
CO2: 25 mmol/L (ref 22–32)
Calcium: 9.1 mg/dL (ref 8.9–10.3)
Chloride: 102 mmol/L (ref 98–111)
Creatinine, Ser: 0.58 mg/dL (ref 0.44–1.00)
GFR calc Af Amer: >60 mL/min (ref >60)
GFR calc non Af Amer: >60 mL/min (ref >60)
Glucose, Bld: 83 mg/dL (ref 70–99)
Potassium: 3.6 mmol/L (ref 3.5–5.1)
Sodium: 139 mmol/L (ref 135–145)
Total Bilirubin: 0.4 mg/dL (ref 0.3–1.2)
Total Protein: 6.7 g/dL (ref 6.5–8.1)

## 2019-03-11 LAB — CBC
HCT: 39.9 % (ref 36.0–46.0)
Hemoglobin: 13 g/dL (ref 12.0–15.0)
MCH: 32 pg (ref 26.0–34.0)
MCHC: 32.6 g/dL (ref 30.0–36.0)
MCV: 98.3 fL (ref 80.0–100.0)
Platelets: 219 10*3/uL (ref 150–400)
RBC: 4.06 MIL/uL (ref 3.87–5.11)
RDW: 13.3 % (ref 11.5–15.5)
WBC: 7 10*3/uL (ref 4.0–10.5)
nRBC: 0 % (ref 0.0–0.2)

## 2019-03-11 LAB — URINALYSIS, ROUTINE W REFLEX MICROSCOPIC
Bilirubin Urine: NEGATIVE
Glucose, UA: NEGATIVE mg/dL
Hgb urine dipstick: NEGATIVE
Ketones, ur: NEGATIVE mg/dL
Leukocytes,Ua: NEGATIVE
Nitrite: NEGATIVE
Protein, ur: NEGATIVE mg/dL
Specific Gravity, Urine: 1.003 — ABNORMAL LOW (ref 1.005–1.030)
pH: 8 (ref 5.0–8.0)

## 2019-03-11 MED ORDER — MORPHINE SULFATE (PF) 4 MG/ML IV SOLN
4.0000 mg | Freq: Once | INTRAVENOUS | Status: AC
  Administered 2019-03-11: 4 mg via INTRAVENOUS
  Filled 2019-03-11: qty 1

## 2019-03-11 MED ORDER — IOHEXOL 300 MG/ML  SOLN
100.0000 mL | Freq: Once | INTRAMUSCULAR | Status: AC | PRN
  Administered 2019-03-11: 21:00:00 100 mL via INTRAVENOUS

## 2019-03-11 MED ORDER — CIPROFLOXACIN HCL 500 MG PO TABS
500.0000 mg | ORAL_TABLET | Freq: Once | ORAL | Status: AC
  Administered 2019-03-11: 22:00:00 500 mg via ORAL
  Filled 2019-03-11: qty 1

## 2019-03-11 MED ORDER — CIPROFLOXACIN HCL 500 MG PO TABS: 500.0000 mg | ORAL_TABLET | Freq: Two times a day (BID) | ORAL | 0 refills | Status: DC

## 2019-03-11 MED ORDER — METRONIDAZOLE 500 MG PO TABS: 500.0000 mg | ORAL_TABLET | Freq: Four times a day (QID) | ORAL | 0 refills | Status: DC

## 2019-03-11 MED ORDER — SODIUM CHLORIDE 0.9 % IV BOLUS
1000.0000 mL | Freq: Once | INTRAVENOUS | Status: AC
  Administered 2019-03-11: 1000 mL via INTRAVENOUS

## 2019-03-11 MED ORDER — METRONIDAZOLE 500 MG PO TABS
500.0000 mg | ORAL_TABLET | Freq: Once | ORAL | Status: AC
  Administered 2019-03-11: 500 mg via ORAL
  Filled 2019-03-11: qty 1

## 2019-03-11 MED ORDER — ONDANSETRON HCL 4 MG/2ML IJ SOLN
4.0000 mg | Freq: Once | INTRAMUSCULAR | Status: AC
  Administered 2019-03-11: 19:00:00 4 mg via INTRAVENOUS
  Filled 2019-03-11: qty 2

## 2019-03-11 NOTE — Discharge Instructions (Addendum)
It was our pleasure to provide your ER care today - we hope that you feel better.  Take antibiotics as prescribed.   Take your ultram as need for pain - no driving for the next 6 hours, or when taking ultram.  Follow up with your doctor/GI doctor in the next 1-2 weeks - have them also review your CT scan from tonight.   Return to ER if worse, new symptoms, high fevers, worsening or severe pain, persistent vomiting, other concern.

## 2019-03-11 NOTE — ED Provider Notes (Signed)
[facility] EMERGENCY DEPARTMENT Provider Note   CSN: [id number] Arrival date & time: 03/11/19  1744     History   Chief Complaint Chief Complaint  Patient presents with  . Abdominal Pain    HPI Theresa Arnold is a 85 y.o. female.     Patient c/o left lower abd pain in the past week. Symptoms gradual onset, moderate, constant, persistent, dull, non radiating. Patient notes similar pain recurrently in same region for past 4 months - ?diverticulitis. Denies nausea or vomiting. Had normal bm yesterday. No abd distension. No gu c/o. No fever or chills.   The history is provided by the patient and the EMS personnel.  Abdominal Pain Associated symptoms: no chest pain, no chills, no diarrhea, no dysuria, no fever, no shortness of breath, no sore throat and no vomiting     Past Medical History:  Diagnosis Date  . CERVICAL RADICULOPATHY, LEFT 09/03/2010  . Cervical spondylosis 11/10/2011  . COPD (chronic obstructive pulmonary disease) ([place]) 11/10/2011  . HYPERLIPIDEMIA 09/03/2010    Patient Active Problem List   Diagnosis Date Noted  . Diverticulitis 11/30/2018  . LLQ pain 11/02/2018  . Abdominal pain 09/21/2018  . Venous (peripheral) insufficiency 01/12/2014  . Sprain of ankle, unspecified site 06/28/2013  . Preventative health care 11/10/2011  . Cervical spondylosis 11/10/2011  . OA (osteoarthritis) of knee 11/10/2011  . COPD (chronic obstructive pulmonary disease) ([place]) 11/10/2011  . Dizziness 11/10/2011  . Benign neoplasm of colon 11/24/2010  . Diverticulosis of colon (without mention of hemorrhage) 11/24/2010  . Encounter for long-term (current) use of other medications 10/07/2010  . HLD (hyperlipidemia) 09/03/2010  . FATIGUE 09/03/2010  . SMOKER 09/10/2009    Past Surgical History:  Procedure Laterality Date  . COLONOSCOPY       OB History   No obstetric history on file.      Home Medications    Prior to Admission medications    Medication Sig Start Date End Date Taking? Authorizing Provider  aspirin 325 MG EC tablet Take 325 mg by mouth daily.      [provider]  dicyclomine (BENTYL) 10 MG capsule TAKE 1 CAPSULE (10 MG TOTAL) BY MOUTH 3 (THREE) TIMES DAILY AS NEEDED FOR SPASMS. Patient not taking: Reported on 11/22/2018 10/31/18   Biagio Borg, MD  ondansetron (ZOFRAN) 4 MG tablet Take 1 tablet (4 mg total) by mouth every 8 (eight) hours as needed for nausea or vomiting. Patient not taking: Reported on 11/22/2018 11/02/18   Biagio Borg, MD  traMADol (ULTRAM) 50 MG tablet Take 1 tablet (50 mg total) by mouth every 6 (six) hours as needed. 01/06/19   Thornton Park, MD    Family History Family History  Problem Relation Age of Onset  . Colon cancer Sister   . Breast cancer Sister   . Heart disease Father   . Hip fracture Mother   . Hypertension Other     Social History Social History   Tobacco Use  . Smoking status: Former Smoker    Packs/day: 1.00    Years: 30.00    Pack years: 30.00    Types: Cigarettes    Quit date: 07/14/2011    Years since quitting: 7.6  . Smokeless tobacco: Never Used  . Tobacco comment: QUIT 2013   Substance Use Topics  . Alcohol use: Yes    Comment: wine occasionally  . Drug use: No     Allergies   Patient has no known allergies.  Review of Systems Review of Systems  Constitutional: Negative for chills and fever.  HENT: Negative for sore throat.   Eyes: Negative for redness.  Respiratory: Negative for shortness of breath.   Cardiovascular: Negative for chest pain.  Gastrointestinal: Positive for abdominal pain. Negative for diarrhea and vomiting.  Genitourinary: Negative for dysuria and flank pain.  Musculoskeletal: Negative for back pain and neck pain.  Skin: Negative for rash.  Neurological: Negative for headaches.  Hematological: Does not bruise/bleed easily.  Psychiatric/Behavioral: Negative for confusion.     Physical Exam Updated Vital  Signs BP 136/66 (BP Location: Right Arm)   Pulse 70   Temp 98.1 F (36.7 C) (Oral)   Resp 18   Ht 1.575 m (5\' 2" )   Wt 46.3 kg   SpO2 100%   BMI 18.66 kg/m   Physical Exam Vitals signs and nursing note reviewed.  Constitutional:      Appearance: Normal appearance. She is well-developed.  HENT:     Head: Atraumatic.     Nose: Nose normal.     Mouth/Throat:     Mouth: Mucous membranes are moist.  Eyes:     General: No scleral icterus.    Conjunctiva/sclera: Conjunctivae normal.  Neck:     Musculoskeletal: Normal range of motion and neck supple. No neck rigidity or muscular tenderness.     Trachea: No tracheal deviation.  Cardiovascular:     Rate and Rhythm: Normal rate and regular rhythm.     Pulses: Normal pulses.     Heart sounds: Normal heart sounds. No murmur. No friction rub. No gallop.   Pulmonary:     Effort: Pulmonary effort is normal. No respiratory distress.     Breath sounds: Normal breath sounds.  Abdominal:     General: Bowel sounds are normal. There is no distension.     Palpations: Abdomen is soft.     Tenderness: There is abdominal tenderness. There is no guarding.     Comments: LLQ tenderness, no peritoneal signs.   Genitourinary:    Comments: No cva tenderness.  Musculoskeletal:        General: No swelling.  Skin:    General: Skin is warm and dry.     Findings: No rash.  Neurological:     Mental Status: She is alert.     Comments: Alert, speech normal.   Psychiatric:        Mood and Affect: Mood normal.      ED Treatments / Results  Labs (all labs ordered are listed, but only abnormal results are displayed) Results for orders placed or performed during the hospital encounter of 03/11/19  Comprehensive metabolic panel  Result Value Ref Range   Sodium 139 135 - 145 mmol/L   Potassium 3.6 3.5 - 5.1 mmol/L   Chloride 102 98 - 111 mmol/L   CO2 25 22 - 32 mmol/L   Glucose, Bld 83 70 - 99 mg/dL   BUN 6 (L) 8 - 23 mg/dL   Creatinine, Ser 0.58  0.44 - 1.00 mg/dL   Calcium 9.1 8.9 - 10.3 mg/dL   Total Protein 6.7 6.5 - 8.1 g/dL   Albumin 3.2 (L) 3.5 - 5.0 g/dL   AST 21 15 - 41 U/L   ALT 7 0 - 44 U/L   Alkaline Phosphatase 51 38 - 126 U/L   Total Bilirubin 0.4 0.3 - 1.2 mg/dL   GFR calc non Af Amer >60 >60 mL/min   GFR calc Af Amer >60 >60 mL/min  Anion gap 12 5 - 15  CBC  Result Value Ref Range   WBC 7.0 4.0 - 10.5 K/uL   RBC 4.06 3.87 - 5.11 MIL/uL   Hemoglobin 13.0 12.0 - 15.0 g/dL   HCT 39.9 36.0 - 46.0 %   MCV 98.3 80.0 - 100.0 fL   MCH 32.0 26.0 - 34.0 pg   MCHC 32.6 30.0 - 36.0 g/dL   RDW 13.3 11.5 - 15.5 %   Platelets 219 150 - 400 K/uL   nRBC 0.0 0.0 - 0.2 %  Urinalysis, Routine w reflex microscopic  Result Value Ref Range   Color, Urine STRAW (A) YELLOW   APPearance CLEAR CLEAR   Specific Gravity, Urine 1.003 (L) 1.005 - 1.030   pH 8.0 5.0 - 8.0   Glucose, UA NEGATIVE NEGATIVE mg/dL   Hgb urine dipstick NEGATIVE NEGATIVE   Bilirubin Urine NEGATIVE NEGATIVE   Ketones, ur NEGATIVE NEGATIVE mg/dL   Protein, ur NEGATIVE NEGATIVE mg/dL   Nitrite NEGATIVE NEGATIVE   Leukocytes,Ua NEGATIVE NEGATIVE   Ct Abdomen Pelvis W Contrast  Result Date: 03/11/2019 CLINICAL DATA:  85 year old female with acute abdominal and pelvic pain. EXAM: CT ABDOMEN AND PELVIS WITH CONTRAST TECHNIQUE: Multidetector CT imaging of the abdomen and pelvis was performed using the standard protocol following bolus administration of intravenous contrast. CONTRAST:  114mL OMNIPAQUE IOHEXOL 300 MG/ML  SOLN COMPARISON:  12/28/2018 FINDINGS: Lower chest: No acute abnormality. Hepatobiliary: The liver and gallbladder are unremarkable except for small hepatic cysts. No biliary dilatation. Pancreas: Unremarkable Spleen: Unremarkable Adrenals/Urinary Tract: The kidneys, adrenal glands and bladder are unremarkable except for stable small probable LEFT renal cyst. Stomach/Bowel: Wall thickening of the proximal sigmoid colon is noted and appears slightly  increased from 12/28/2018 and may represent increasing diverticulitis. A 3.5 cm intramural collection along the LEFT sigmoid colonic wall is again noted. Mild colonic distension proximal to the sigmoid colon is noted with fluid and gas. There is no evidence of small bowel obstruction. Vascular/Lymphatic: Aortic atherosclerosis. No enlarged abdominal or pelvic lymph nodes. Reproductive: Uterus and bilateral adnexa are unremarkable. Other: No ascites, pneumoperitoneum or new collection. Musculoskeletal: No acute or suspicious bony abnormalities. IMPRESSION: 1. Apparent increase in proximal sigmoid colon wall thickening which may represent flare up of acute diverticulitis at this area. Unchanged 3.5 cm intramural sigmoid colonic wall abscess/collection, which has been present since 11/02/2018. Relative distension of the colon proximal to the sigmoid colon which may represent a low grade obstruction. No evidence of pneumoperitoneum. 2.  Aortic Atherosclerosis (ICD10-I70.0). Electronically Signed   By: Margarette Canada M.D.   On: 03/11/2019 21:15    EKG None  Radiology Ct Abdomen Pelvis W Contrast  Result Date: 03/11/2019 CLINICAL DATA:  85 year old female with acute abdominal and pelvic pain. EXAM: CT ABDOMEN AND PELVIS WITH CONTRAST TECHNIQUE: Multidetector CT imaging of the abdomen and pelvis was performed using the standard protocol following bolus administration of intravenous contrast. CONTRAST:  126mL OMNIPAQUE IOHEXOL 300 MG/ML  SOLN COMPARISON:  12/28/2018 FINDINGS: Lower chest: No acute abnormality. Hepatobiliary: The liver and gallbladder are unremarkable except for small hepatic cysts. No biliary dilatation. Pancreas: Unremarkable Spleen: Unremarkable Adrenals/Urinary Tract: The kidneys, adrenal glands and bladder are unremarkable except for stable small probable LEFT renal cyst. Stomach/Bowel: Wall thickening of the proximal sigmoid colon is noted and appears slightly increased from 12/28/2018 and may  represent increasing diverticulitis. A 3.5 cm intramural collection along the LEFT sigmoid colonic wall is again noted. Mild colonic distension proximal to the sigmoid colon  is noted with fluid and gas. There is no evidence of small bowel obstruction. Vascular/Lymphatic: Aortic atherosclerosis. No enlarged abdominal or pelvic lymph nodes. Reproductive: Uterus and bilateral adnexa are unremarkable. Other: No ascites, pneumoperitoneum or new collection. Musculoskeletal: No acute or suspicious bony abnormalities. IMPRESSION: 1. Apparent increase in proximal sigmoid colon wall thickening which may represent flare up of acute diverticulitis at this area. Unchanged 3.5 cm intramural sigmoid colonic wall abscess/collection, which has been present since 11/02/2018. Relative distension of the colon proximal to the sigmoid colon which may represent a low grade obstruction. No evidence of pneumoperitoneum. 2.  Aortic Atherosclerosis (ICD10-I70.0). Electronically Signed   By: Margarette Canada M.D.   On: 03/11/2019 21:15    Procedures Procedures (including critical care time)  Medications Ordered in ED Medications  sodium chloride 0.9 % bolus 1,000 mL (1,000 mLs Intravenous New Bag/Given 03/11/19 1908)  morphine 4 MG/ML injection 4 mg (4 mg Intravenous Given 03/11/19 1909)  ondansetron (ZOFRAN) injection 4 mg (4 mg Intravenous Given 03/11/19 1908)     Initial Impression / Assessment and Plan / ED Course  I have reviewed the triage vital signs and the nursing notes.  Pertinent labs & imaging results that were available during my care of the patient were reviewed by me and considered in my medical decision making (see chart for details).  Iv ns. Morphine iv. zofran iv.   Reviewed nursing notes and prior charts for additional history.   Labs reviewed by me - wbc normal.  CT reviewed by me - ?mild recurrent sigmoid diverticulitis. The 3 cm intramural collection appears unchanged from prior cts.   Patient has no  vomiting and is tolerating po. No distension.   Flagyl and cipro given.   Pain improved. Vitals normal. Afebrile.   Discussed need pcp and gi f/u with pt.   Return precautions provided.       Final Clinical Impressions(s) / ED Diagnoses   Final diagnoses:  None    ED Discharge Orders    None       Lajean Saver, MD 03/11/19 2246

## 2019-03-11 NOTE — ED Notes (Signed)
Patient verbalizes understanding of discharge instructions. Opportunity for questioning and answers were provided. Armband removed by staff, pt discharged from ED in wheelchair.  

## 2019-03-11 NOTE — ED Triage Notes (Signed)
Pt arrives EMS from home with c/o abdominal pain ! Week. Pt has hx of colitis and tramadol for same. Last BM yesterday.

## 2019-03-15 ENCOUNTER — Other Ambulatory Visit: Payer: Self-pay

## 2019-03-15 ENCOUNTER — Inpatient Hospital Stay (HOSPITAL_COMMUNITY)
Admission: EM | Admit: 2019-03-15 | Discharge: 2019-04-02 | DRG: 329 | Disposition: A | Discharge status: Skilled Nursing Facility | Payer: Medicare Other | Attending: Internal Medicine | Admitting: Internal Medicine

## 2019-03-15 ENCOUNTER — Telehealth: Payer: Self-pay | Admitting: Gastroenterology

## 2019-03-15 ENCOUNTER — Emergency Department (HOSPITAL_COMMUNITY): Payer: Medicare Other

## 2019-03-15 ENCOUNTER — Encounter (HOSPITAL_COMMUNITY): Payer: Self-pay

## 2019-03-15 DIAGNOSIS — Z681 Body mass index (BMI) 19 or less, adult: Secondary | ICD-10-CM

## 2019-03-15 DIAGNOSIS — K56699 Other intestinal obstruction unspecified as to partial versus complete obstruction: Secondary | ICD-10-CM | POA: Diagnosis present

## 2019-03-15 DIAGNOSIS — R1032 Left lower quadrant pain: Secondary | ICD-10-CM | POA: Diagnosis not present

## 2019-03-15 DIAGNOSIS — R1111 Vomiting without nausea: Secondary | ICD-10-CM | POA: Diagnosis not present

## 2019-03-15 DIAGNOSIS — R112 Nausea with vomiting, unspecified: Secondary | ICD-10-CM | POA: Diagnosis not present

## 2019-03-15 DIAGNOSIS — K5720 Diverticulitis of large intestine with perforation and abscess without bleeding: Principal | ICD-10-CM | POA: Diagnosis present

## 2019-03-15 DIAGNOSIS — K635 Polyp of colon: Secondary | ICD-10-CM | POA: Diagnosis not present

## 2019-03-15 DIAGNOSIS — Z209 Contact with and (suspected) exposure to unspecified communicable disease: Secondary | ICD-10-CM | POA: Diagnosis not present

## 2019-03-15 DIAGNOSIS — Z87891 Personal history of nicotine dependence: Secondary | ICD-10-CM | POA: Diagnosis not present

## 2019-03-15 DIAGNOSIS — E876 Hypokalemia: Secondary | ICD-10-CM | POA: Diagnosis present

## 2019-03-15 DIAGNOSIS — K56609 Unspecified intestinal obstruction, unspecified as to partial versus complete obstruction: Secondary | ICD-10-CM | POA: Diagnosis present

## 2019-03-15 DIAGNOSIS — J449 Chronic obstructive pulmonary disease, unspecified: Secondary | ICD-10-CM | POA: Diagnosis present

## 2019-03-15 DIAGNOSIS — G9341 Metabolic encephalopathy: Secondary | ICD-10-CM | POA: Diagnosis not present

## 2019-03-15 DIAGNOSIS — Z7982 Long term (current) use of aspirin: Secondary | ICD-10-CM

## 2019-03-15 DIAGNOSIS — K567 Ileus, unspecified: Secondary | ICD-10-CM | POA: Diagnosis not present

## 2019-03-15 DIAGNOSIS — K9189 Other postprocedural complications and disorders of digestive system: Secondary | ICD-10-CM | POA: Diagnosis not present

## 2019-03-15 DIAGNOSIS — Z800 Family history of malignant neoplasm of digestive organs: Secondary | ICD-10-CM | POA: Diagnosis not present

## 2019-03-15 DIAGNOSIS — Z8249 Family history of ischemic heart disease and other diseases of the circulatory system: Secondary | ICD-10-CM | POA: Diagnosis not present

## 2019-03-15 DIAGNOSIS — M2550 Pain in unspecified joint: Secondary | ICD-10-CM | POA: Diagnosis not present

## 2019-03-15 DIAGNOSIS — R41841 Cognitive communication deficit: Secondary | ICD-10-CM | POA: Diagnosis not present

## 2019-03-15 DIAGNOSIS — R0902 Hypoxemia: Secondary | ICD-10-CM | POA: Diagnosis not present

## 2019-03-15 DIAGNOSIS — R062 Wheezing: Secondary | ICD-10-CM | POA: Diagnosis not present

## 2019-03-15 DIAGNOSIS — K5732 Diverticulitis of large intestine without perforation or abscess without bleeding: Secondary | ICD-10-CM

## 2019-03-15 DIAGNOSIS — M6281 Muscle weakness (generalized): Secondary | ICD-10-CM | POA: Diagnosis not present

## 2019-03-15 DIAGNOSIS — I469 Cardiac arrest, cause unspecified: Secondary | ICD-10-CM | POA: Diagnosis not present

## 2019-03-15 DIAGNOSIS — R0609 Other forms of dyspnea: Secondary | ICD-10-CM

## 2019-03-15 DIAGNOSIS — R1084 Generalized abdominal pain: Secondary | ICD-10-CM | POA: Diagnosis not present

## 2019-03-15 DIAGNOSIS — R933 Abnormal findings on diagnostic imaging of other parts of digestive tract: Secondary | ICD-10-CM | POA: Diagnosis not present

## 2019-03-15 DIAGNOSIS — K5669 Other partial intestinal obstruction: Secondary | ICD-10-CM | POA: Diagnosis not present

## 2019-03-15 DIAGNOSIS — R140 Abdominal distension (gaseous): Secondary | ICD-10-CM | POA: Diagnosis present

## 2019-03-15 DIAGNOSIS — R634 Abnormal weight loss: Secondary | ICD-10-CM | POA: Diagnosis present

## 2019-03-15 DIAGNOSIS — K5900 Constipation, unspecified: Secondary | ICD-10-CM | POA: Diagnosis present

## 2019-03-15 DIAGNOSIS — M4722 Other spondylosis with radiculopathy, cervical region: Secondary | ICD-10-CM | POA: Diagnosis present

## 2019-03-15 DIAGNOSIS — R278 Other lack of coordination: Secondary | ICD-10-CM | POA: Diagnosis not present

## 2019-03-15 DIAGNOSIS — R109 Unspecified abdominal pain: Secondary | ICD-10-CM | POA: Diagnosis not present

## 2019-03-15 DIAGNOSIS — D649 Anemia, unspecified: Secondary | ICD-10-CM | POA: Diagnosis present

## 2019-03-15 DIAGNOSIS — R066 Hiccough: Secondary | ICD-10-CM | POA: Diagnosis not present

## 2019-03-15 DIAGNOSIS — Z48815 Encounter for surgical aftercare following surgery on the digestive system: Secondary | ICD-10-CM | POA: Diagnosis not present

## 2019-03-15 DIAGNOSIS — E785 Hyperlipidemia, unspecified: Secondary | ICD-10-CM | POA: Diagnosis present

## 2019-03-15 DIAGNOSIS — Z803 Family history of malignant neoplasm of breast: Secondary | ICD-10-CM | POA: Diagnosis not present

## 2019-03-15 DIAGNOSIS — Z20828 Contact with and (suspected) exposure to other viral communicable diseases: Secondary | ICD-10-CM | POA: Diagnosis not present

## 2019-03-15 DIAGNOSIS — E43 Unspecified severe protein-calorie malnutrition: Secondary | ICD-10-CM | POA: Diagnosis not present

## 2019-03-15 DIAGNOSIS — R2681 Unsteadiness on feet: Secondary | ICD-10-CM | POA: Diagnosis not present

## 2019-03-15 DIAGNOSIS — Z79899 Other long term (current) drug therapy: Secondary | ICD-10-CM

## 2019-03-15 DIAGNOSIS — B370 Candidal stomatitis: Secondary | ICD-10-CM | POA: Diagnosis not present

## 2019-03-15 DIAGNOSIS — K5730 Diverticulosis of large intestine without perforation or abscess without bleeding: Secondary | ICD-10-CM | POA: Diagnosis not present

## 2019-03-15 DIAGNOSIS — R319 Hematuria, unspecified: Secondary | ICD-10-CM | POA: Diagnosis not present

## 2019-03-15 DIAGNOSIS — D126 Benign neoplasm of colon, unspecified: Secondary | ICD-10-CM | POA: Diagnosis not present

## 2019-03-15 DIAGNOSIS — Z7401 Bed confinement status: Secondary | ICD-10-CM | POA: Diagnosis not present

## 2019-03-15 DIAGNOSIS — Z03818 Encounter for observation for suspected exposure to other biological agents ruled out: Secondary | ICD-10-CM | POA: Diagnosis not present

## 2019-03-15 DIAGNOSIS — R2689 Other abnormalities of gait and mobility: Secondary | ICD-10-CM | POA: Diagnosis not present

## 2019-03-15 DIAGNOSIS — R52 Pain, unspecified: Secondary | ICD-10-CM | POA: Diagnosis not present

## 2019-03-15 DIAGNOSIS — K56600 Partial intestinal obstruction, unspecified as to cause: Secondary | ICD-10-CM | POA: Diagnosis not present

## 2019-03-15 DIAGNOSIS — J90 Pleural effusion, not elsewhere classified: Secondary | ICD-10-CM | POA: Diagnosis not present

## 2019-03-15 LAB — COMPREHENSIVE METABOLIC PANEL
ALT: 7 U/L (ref 0–44)
AST: 16 U/L (ref 15–41)
Albumin: 3.4 g/dL — ABNORMAL LOW (ref 3.5–5.0)
Alkaline Phosphatase: 48 U/L (ref 38–126)
Anion gap: 14 (ref 5–15)
BUN: 11 mg/dL (ref 8–23)
CO2: 25 mmol/L (ref 22–32)
Calcium: 8.8 mg/dL — ABNORMAL LOW (ref 8.9–10.3)
Chloride: 99 mmol/L (ref 98–111)
Creatinine, Ser: 0.62 mg/dL (ref 0.44–1.00)
GFR calc Af Amer: >60 mL/min (ref >60)
GFR calc non Af Amer: >60 mL/min (ref >60)
Glucose, Bld: 86 mg/dL (ref 70–99)
Potassium: 3.1 mmol/L — ABNORMAL LOW (ref 3.5–5.1)
Sodium: 138 mmol/L (ref 135–145)
Total Bilirubin: 1.1 mg/dL (ref 0.3–1.2)
Total Protein: 7 g/dL (ref 6.5–8.1)

## 2019-03-15 LAB — CBC
HCT: 42.7 % (ref 36.0–46.0)
Hemoglobin: 13.8 g/dL (ref 12.0–15.0)
MCH: 32 pg (ref 26.0–34.0)
MCHC: 32.3 g/dL (ref 30.0–36.0)
MCV: 99.1 fL (ref 80.0–100.0)
Platelets: 200 10*3/uL (ref 150–400)
RBC: 4.31 MIL/uL (ref 3.87–5.11)
RDW: 13.7 % (ref 11.5–15.5)
WBC: 7.1 10*3/uL (ref 4.0–10.5)
nRBC: 0 % (ref 0.0–0.2)

## 2019-03-15 LAB — MAGNESIUM: Magnesium: 2.6 mg/dL — ABNORMAL HIGH (ref 1.7–2.4)

## 2019-03-15 LAB — LACTIC ACID, PLASMA: Lactic Acid, Venous: 1.5 mmol/L (ref 0.5–1.9)

## 2019-03-15 MED ORDER — PANTOPRAZOLE SODIUM 40 MG IV SOLR
40.0000 mg | Freq: Once | INTRAVENOUS | Status: AC
  Administered 2019-03-15: 20:00:00 40 mg via INTRAVENOUS
  Filled 2019-03-15: qty 40

## 2019-03-15 MED ORDER — PIPERACILLIN-TAZOBACTAM 3.375 G IVPB 30 MIN
3.3750 g | INTRAVENOUS | Status: AC
  Administered 2019-03-16: 3.375 g via INTRAVENOUS
  Filled 2019-03-15: qty 50

## 2019-03-15 MED ORDER — LACTATED RINGERS IV SOLN
INTRAVENOUS | Status: DC
  Administered 2019-03-15: 20:00:00 via INTRAVENOUS

## 2019-03-15 MED ORDER — ACETAMINOPHEN 650 MG RE SUPP: 650.0000 mg | Freq: Four times a day (QID) | RECTAL | Status: DC | PRN

## 2019-03-15 MED ORDER — ONDANSETRON HCL 4 MG/2ML IJ SOLN
4.0000 mg | Freq: Once | INTRAMUSCULAR | Status: AC
  Administered 2019-03-15: 20:00:00 4 mg via INTRAVENOUS
  Filled 2019-03-15: qty 2

## 2019-03-15 MED ORDER — ONDANSETRON HCL 4 MG PO TABS: 4.0000 mg | ORAL_TABLET | Freq: Four times a day (QID) | ORAL | Status: DC | PRN

## 2019-03-15 MED ORDER — ACETAMINOPHEN 325 MG PO TABS
650.0000 mg | ORAL_TABLET | Freq: Four times a day (QID) | ORAL | Status: DC | PRN
  Administered 2019-03-16: 325 mg via ORAL
  Filled 2019-03-15: qty 2

## 2019-03-15 MED ORDER — LACTATED RINGERS IV SOLN
INTRAVENOUS | Status: DC
  Administered 2019-03-16: via INTRAVENOUS

## 2019-03-15 MED ORDER — ALBUTEROL SULFATE HFA 108 (90 BASE) MCG/ACT IN AERS: 1.0000 | INHALATION_SPRAY | Freq: Four times a day (QID) | RESPIRATORY_TRACT | Status: DC | PRN

## 2019-03-15 MED ORDER — ONDANSETRON HCL 4 MG/2ML IJ SOLN
4.0000 mg | Freq: Four times a day (QID) | INTRAMUSCULAR | Status: DC | PRN
  Administered 2019-03-20 – 2019-03-29 (×9): 4 mg via INTRAVENOUS
  Filled 2019-03-15 (×10): qty 2

## 2019-03-15 MED ORDER — POTASSIUM CHLORIDE 10 MEQ/100ML IV SOLN
10.0000 meq | INTRAVENOUS | Status: AC
  Administered 2019-03-16 (×3): 10 meq via INTRAVENOUS
  Filled 2019-03-15 (×4): qty 100

## 2019-03-15 MED ORDER — PIPERACILLIN-TAZOBACTAM 3.375 G IVPB
3.3750 g | Freq: Three times a day (TID) | INTRAVENOUS | Status: DC
  Administered 2019-03-16 – 2019-03-22 (×20): 3.375 g via INTRAVENOUS
  Filled 2019-03-15 (×19): qty 50

## 2019-03-15 NOTE — Progress Notes (Signed)
Pharmacy Antibiotic Note  Theresa Arnold is a 85 y.o. female admitted on 03/15/2019 with intra-abdominal infection.  Pharmacy has been consulted for zosyn dosing.  Plan: Zosyn 3.375g IV q8h (4 hour infusion). F/u scr/cultures  Weight: 103 lb 9.9 oz (47 kg)  Temp (24hrs), Avg:97.6 F (36.4 C), Min:97.6 F (36.4 C), Max:97.6 F (36.4 C)  Recent Labs  Lab 03/11/19 1906 03/15/19 1940  WBC 7.0 7.1  CREATININE 0.58 0.62  LATICACIDVEN  --  1.5    Estimated Creatinine Clearance: 38.8 mL/min (by C-G formula based on SCr of 0.62 mg/dL).    No Known Allergies  Antimicrobials this admission: 9/1 zosyn >>    >>   Dose adjustments this admission:   Microbiology results:  BCx:   UCx:    Sputum:    MRSA PCR:   Thank you for allowing pharmacy to be a part of this patient's care.  Dorrene German 03/15/2019 11:15 PM

## 2019-03-15 NOTE — ED Triage Notes (Addendum)
Pt BIBA from home. Pt c/o LLQ pain. Pt has hx of diverticulosis. Pt was seen at [facility] 4 days ago for same.  Pt states she has not had a BM "Since last week". Pt states that her stomach feels bloated as well. Pt was given cipro. Pt states she has been unable to take for 4 days, stating that she cannot keep anything down. Pt is normally independent.

## 2019-03-15 NOTE — ED Notes (Signed)
Lab made aware of add on for magnesium

## 2019-03-15 NOTE — H&P (Signed)
History and Physical    Theresa Arnold [id number] DOB: 27-Mar-1932 DOA: 03/15/2019  PCP: Biagio Borg, MD  Patient coming from: Home via EMS  I have personally briefly reviewed patient's old medical records in [place]  Chief Complaint: Abdominal pain  HPI: Theresa Arnold is a 85 y.o. female with medical history significant for COPD, hyperlipidemia, and chronic sigmoid diverticulitis with known abscess and stricture in the distal sigmoid colon who presents to the ED for evaluation of abdominal pain.  Patient follows with GI, Dr. Tarri Glenn, for known sigmoid diverticulitis and abscess.  Per GI documentation, case was discussed with IR and fluid collection was not considered amenable to IR drainage.  She underwent colonoscopy 01/27/2019 which was limited due to presence of stool.  Distal sigmoid stricture was seen.  She was reportedly referred to general surgery, [facility] Dr. Dema Severin, and has no present plans for surgical intervention.  Patient was seen in the ED on 03/11/2019 at which time a repeat CT abdomen/pelvis with contrast showed an apparent increase in proximal sigmoid colon wall thickening and unchanged 3.5 cm intramural sigmoid colonic wall abscess/collection which was present since 11/02/2018.  There was question of low-grade obstruction. She was given a prescription for ciprofloxacin and metronidazole.  Patient states that she has had continued generalized abdominal pain.  She has not had oral intake due to the nausea after eating.  She has been unable to take her home medications including the recently prescribed antibiotics.  She states that she has not had any bowel movements despite use of MiraLAX for several days.  She reports a 30 pound weight loss since last year.  She has not had any subjective fevers.  She reports good urine output without dysuria.  ED Course:  Initial vitals showed BP 110/71, pulse 82, RR 23, temp 97.6 Fahrenheit, SPO2 98% on room air  Labs notable for  potassium 3.1, magnesium 2.6, BUN 11, creatinine 0.62, WBC 7.1, hemoglobin 13.8, platelets 200,000.  Abdominal x-ray showed sub-pathologic gaseous distention of small bowel loops and colon which may represent obstruction at the distal colon or ileus.  Patient was given IV fluids, Protonix, Zofran, and the hospitalist service was consulted to admit for further evaluation and management.   Review of Systems: All systems reviewed and are negative except as documented in history of present illness above.   Past Medical History:  Diagnosis Date  . CERVICAL RADICULOPATHY, LEFT 09/03/2010  . Cervical spondylosis 11/10/2011  . COPD (chronic obstructive pulmonary disease) ([place]) 11/10/2011  . HYPERLIPIDEMIA 09/03/2010    Past Surgical History:  Procedure Laterality Date  . COLONOSCOPY      Social History:  reports that she quit smoking about 7 years ago. Her smoking use included cigarettes. She has a 30.00 pack-year smoking history. She has never used smokeless tobacco. She reports current alcohol use. She reports that she does not use drugs.  No Known Allergies  Family History  Problem Relation Age of Onset  . Colon cancer Sister   . Breast cancer Sister   . Heart disease Father   . Hip fracture Mother   . Hypertension Other      Prior to Admission medications   Medication Sig Start Date End Date Taking? Authorizing Provider  aspirin 325 MG EC tablet Take 325 mg by mouth daily.      [provider]  ciprofloxacin (CIPRO) 500 MG tablet Take 1 tablet (500 mg total) by mouth 2 (two) times daily. 03/11/19   Lajean Saver,  MD  dicyclomine (BENTYL) 10 MG capsule TAKE 1 CAPSULE (10 MG TOTAL) BY MOUTH 3 (THREE) TIMES DAILY AS NEEDED FOR SPASMS. Patient not taking: Reported on 11/22/2018 10/31/18   Biagio Borg, MD  metroNIDAZOLE (FLAGYL) 500 MG tablet Take 1 tablet (500 mg total) by mouth 4 (four) times daily. 03/11/19   Lajean Saver, MD  ondansetron (ZOFRAN) 4 MG tablet Take 1 tablet  (4 mg total) by mouth every 8 (eight) hours as needed for nausea or vomiting. Patient not taking: Reported on 11/22/2018 11/02/18   Biagio Borg, MD  traMADol (ULTRAM) 50 MG tablet Take 1 tablet (50 mg total) by mouth every 6 (six) hours as needed. 01/06/19   Thornton Park, MD    Physical Exam: Vitals:   03/15/19 1900 03/15/19 1930 03/15/19 2000 03/15/19 2030  BP:      Pulse: 84 78 77 79  Resp: (!) 23 (!) 22 (!) 23 (!) 24  Temp:      TempSrc:      SpO2: 98% 98% 98% 97%  Weight:        Constitutional: Thin woman resting in bed, NAD, calm, comfortable Eyes: PERRL, lids and conjunctivae normal ENMT: Mucous membranes are moist. Posterior pharynx clear of any exudate or lesions.Normal dentition.  Neck: normal, supple, no masses. Respiratory: clear to auscultation bilaterally, no wheezing, no crackles. Normal respiratory effort. No accessory muscle use.  Cardiovascular: Regular rate and rhythm, no murmurs / rubs / gallops. No extremity edema. 2+ pedal pulses. Abdomen: Generalized mild tenderness, no masses palpated. No hepatosplenomegaly. Bowel sounds positive.  Musculoskeletal: no clubbing / cyanosis. No joint deformity upper and lower extremities. Good ROM, no contractures. Normal muscle tone.  Skin: no rashes, lesions, ulcers. No induration Neurologic: CN 2-12 grossly intact. Sensation intact,Strength 5/5 in all 4.  Psychiatric: Normal judgment and insight. Alert and oriented x 3. Normal mood.     Labs on Admission: I have personally reviewed following labs and imaging studies  CBC: Recent Labs  Lab 03/11/19 1906 03/15/19 1940  WBC 7.0 7.1  HGB 13.0 13.8  HCT 39.9 42.7  MCV 98.3 99.1  PLT 219 [id number]   Basic Metabolic Panel: Recent Labs  Lab 03/11/19 1906 03/15/19 1940  NA 139 138  K 3.6 3.1*  CL 102 99  CO2 25 25  GLUCOSE 83 86  BUN 6* 11  CREATININE 0.58 0.62  CALCIUM 9.1 8.8*  MG  --  2.6*   GFR: Estimated Creatinine Clearance: 38.8 mL/min (by C-G formula  based on SCr of 0.62 mg/dL). Liver Function Tests: Recent Labs  Lab 03/11/19 1906 03/15/19 1940  AST 21 16  ALT 7 7  ALKPHOS 51 48  BILITOT 0.4 1.1  PROT 6.7 7.0  ALBUMIN 3.2* 3.4*   No results for input(s): LIPASE, AMYLASE in the last 168 hours. No results for input(s): AMMONIA in the last 168 hours. Coagulation Profile: No results for input(s): INR, PROTIME in the last 168 hours. Cardiac Enzymes: No results for input(s): CKTOTAL, CKMB, CKMBINDEX, TROPONINI in the last 168 hours. BNP (last 3 results) No results for input(s): PROBNP in the last 8760 hours. HbA1C: No results for input(s): HGBA1C in the last 72 hours. CBG: No results for input(s): GLUCAP in the last 168 hours. Lipid Profile: No results for input(s): CHOL, HDL, LDLCALC, TRIG, CHOLHDL, LDLDIRECT in the last 72 hours. Thyroid Function Tests: No results for input(s): TSH, T4TOTAL, FREET4, T3FREE, THYROIDAB in the last 72 hours. Anemia Panel: No results for input(s): VITAMINB12,  FOLATE, FERRITIN, TIBC, IRON, RETICCTPCT in the last 72 hours. Urine analysis:    Component Value Date/Time   COLORURINE STRAW (A) 03/11/2019 1906   APPEARANCEUR CLEAR 03/11/2019 1906   LABSPEC 1.003 (L) 03/11/2019 1906   PHURINE 8.0 03/11/2019 1906   GLUCOSEU NEGATIVE 03/11/2019 1906   GLUCOSEU NEGATIVE 11/02/2018 1103   HGBUR NEGATIVE 03/11/2019 1906   BILIRUBINUR NEGATIVE 03/11/2019 1906   KETONESUR NEGATIVE 03/11/2019 1906   PROTEINUR NEGATIVE 03/11/2019 1906   UROBILINOGEN 0.2 11/02/2018 1103   NITRITE NEGATIVE 03/11/2019 1906   LEUKOCYTESUR NEGATIVE 03/11/2019 1906    Radiological Exams on Admission: Dg Abd Acute W/chest  Result Date: 03/15/2019 CLINICAL DATA:  Left lower quadrant pain. EXAM: DG ABDOMEN ACUTE W/ 1V CHEST COMPARISON:  Abdominal CT March 11, 2019 FINDINGS: Sub pathologic gaseous distension of small bowel loops and colon which may represent a low-grade/intermediate obstruction at the level of the distal colon,  or ileus. No evidence of free gas in the abdomen seen radiographically. IMPRESSION: Sub pathologic gaseous distension of small bowel loops and colon which may represent a low-grade/intermediate obstruction at the level of the distal colon, or ileus. Electronically Signed   By: Fidela Salisbury M.D.   On: 03/15/2019 18:37    EKG: Independently reviewed.   Assessment/Plan Active Problems:   COPD (chronic obstructive pulmonary disease) (HCC)   Diverticulitis of sigmoid colon  Theresa Arnold is a 85 y.o. female with medical history significant for COPD, hyperlipidemia, and chronic sigmoid diverticulitis with known abscess and stricture in the distal sigmoid colon who is admitted with poor oral intake, nausea, vomiting.   Chronic/recurrent sigmoid diverticulitis with known abscess and distal sigmoid stricture: With possible obstruction at distal colon or ileus.  Patient has continued nausea and vomiting with any oral intake and is unable to take oral medications. -Keep n.p.o. -Continue IV LR overnight -Start IV Zosyn -Consider further GI and/or general surgery evaluation pending clinical progress  Hypokalemia: -IV repletion ordered  COPD: Stable without acute issue.  Continue albuterol as needed.   DVT prophylaxis: SCDs Code Status: Full code, confirmed with patient Family Communication: Discussed with husband at bedside Disposition Plan: Pending clinical progress Consults called: None Admission status: Admit - It is my clinical opinion that admission to INPATIENT is reasonable and necessary because of the expectation that this patient will require hospital care that crosses at least 2 midnights to treat this condition based on the medical complexity of the problems presented.  Given the aforementioned information, the predictability of an adverse outcome is felt to be significant.    Zada Finders MD [facility]  If 7PM-7AM, please contact night-coverage [email or web address]   03/15/2019, 11:07 PM

## 2019-03-15 NOTE — Telephone Encounter (Signed)
Left message for the patient to call back.

## 2019-03-15 NOTE — ED Provider Notes (Signed)
[facility] DEPT Provider Note   CSN: [id number] Arrival date & time: 03/15/19  1602     History   Chief Complaint Chief Complaint  Patient presents with  . Abdominal Pain    HPI Theresa Arnold is a 85 y.o. female.     HPI Patient was seen on 8/29 with complaints of left lower abdominal pain.  She had had similar pain on and off for about 4 months.  CT scan shows some possible mild recurrent diverticulitis.  Patient was started on Cipro and Flagyl.  She reports that she has not improved and continues to have pain.  She reports that she has had vomiting and cannot eat or take her medications.  Colonoscopy was attempted for further define problems with possible stricture and persistent area of colitis.  Due to inadequate bowel prep this was unsuccessful.  Patient has had weight loss of 30 pounds reportedly over the last year and problems with abdominal pain. Past Medical History:  Diagnosis Date  . CERVICAL RADICULOPATHY, LEFT 09/03/2010  . Cervical spondylosis 11/10/2011  . COPD (chronic obstructive pulmonary disease) ([place]) 11/10/2011  . HYPERLIPIDEMIA 09/03/2010    Patient Active Problem List   Diagnosis Date Noted  . Diverticulitis 11/30/2018  . LLQ pain 11/02/2018  . Abdominal pain 09/21/2018  . Venous (peripheral) insufficiency 01/12/2014  . Sprain of ankle, unspecified site 06/28/2013  . Preventative health care 11/10/2011  . Cervical spondylosis 11/10/2011  . OA (osteoarthritis) of knee 11/10/2011  . COPD (chronic obstructive pulmonary disease) ([place]) 11/10/2011  . Dizziness 11/10/2011  . Benign neoplasm of colon 11/24/2010  . Diverticulosis of colon (without mention of hemorrhage) 11/24/2010  . Encounter for long-term (current) use of other medications 10/07/2010  . HLD (hyperlipidemia) 09/03/2010  . FATIGUE 09/03/2010  . SMOKER 09/10/2009    Past Surgical History:  Procedure Laterality Date  . COLONOSCOPY       OB History   No  obstetric history on file.      Home Medications    Prior to Admission medications   Medication Sig Start Date End Date Taking? Authorizing Provider  aspirin 325 MG EC tablet Take 325 mg by mouth daily.      [provider]  ciprofloxacin (CIPRO) 500 MG tablet Take 1 tablet (500 mg total) by mouth 2 (two) times daily. 03/11/19   Lajean Saver, MD  dicyclomine (BENTYL) 10 MG capsule TAKE 1 CAPSULE (10 MG TOTAL) BY MOUTH 3 (THREE) TIMES DAILY AS NEEDED FOR SPASMS. Patient not taking: Reported on 11/22/2018 10/31/18   Biagio Borg, MD  metroNIDAZOLE (FLAGYL) 500 MG tablet Take 1 tablet (500 mg total) by mouth 4 (four) times daily. 03/11/19   Lajean Saver, MD  ondansetron (ZOFRAN) 4 MG tablet Take 1 tablet (4 mg total) by mouth every 8 (eight) hours as needed for nausea or vomiting. Patient not taking: Reported on 11/22/2018 11/02/18   Biagio Borg, MD  traMADol (ULTRAM) 50 MG tablet Take 1 tablet (50 mg total) by mouth every 6 (six) hours as needed. 01/06/19   Thornton Park, MD    Family History Family History  Problem Relation Age of Onset  . Colon cancer Sister   . Breast cancer Sister   . Heart disease Father   . Hip fracture Mother   . Hypertension Other     Social History Social History   Tobacco Use  . Smoking status: Former Smoker    Packs/day: 1.00    Years: 30.00  Pack years: 30.00    Types: Cigarettes    Quit date: 07/14/2011    Years since quitting: 7.6  . Smokeless tobacco: Never Used  . Tobacco comment: QUIT 2013   Substance Use Topics  . Alcohol use: Yes    Comment: wine occasionally  . Drug use: No     Allergies   Patient has no known allergies.   Review of Systems Review of Systems 10 Systems reviewed and are negative for acute change except as noted in the HPI.   Physical Exam Updated Vital Signs BP 117/74   Pulse 79   Temp 97.6 F (36.4 C) (Oral)   Resp (!) 24   Wt 47 kg   SpO2 97%   BMI 18.95 kg/m   Physical Exam  Constitutional:      Appearance: She is well-developed.  HENT:     Head: Normocephalic and atraumatic.  Eyes:     Extraocular Movements: Extraocular movements intact.     Conjunctiva/sclera: Conjunctivae normal.  Neck:     Musculoskeletal: Neck supple.  Cardiovascular:     Rate and Rhythm: Normal rate and regular rhythm.     Heart sounds: Normal heart sounds.  Pulmonary:     Effort: Pulmonary effort is normal.     Breath sounds: Normal breath sounds.  Abdominal:     General: Bowel sounds are normal. There is no distension.     Palpations: Abdomen is soft.     Tenderness: There is abdominal tenderness.  Musculoskeletal: Normal range of motion.  Skin:    General: Skin is warm and dry.  Neurological:     Mental Status: She is alert and oriented to person, place, and time.     GCS: GCS eye subscore is 4. GCS verbal subscore is 5. GCS motor subscore is 6.     Coordination: Coordination normal.  Psychiatric:        Mood and Affect: Mood normal.      ED Treatments / Results  Labs (all labs ordered are listed, but only abnormal results are displayed) Labs Reviewed  COMPREHENSIVE METABOLIC PANEL - Abnormal; Notable for the following components:      Result Value   Potassium 3.1 (*)    Calcium 8.8 (*)    Albumin 3.4 (*)    All other components within normal limits  CBC  LACTIC ACID, PLASMA  LACTIC ACID, PLASMA    EKG None  Radiology Dg Abd Acute W/chest  Result Date: 03/15/2019 CLINICAL DATA:  Left lower quadrant pain. EXAM: DG ABDOMEN ACUTE W/ 1V CHEST COMPARISON:  Abdominal CT March 11, 2019 FINDINGS: Sub pathologic gaseous distension of small bowel loops and colon which may represent a low-grade/intermediate obstruction at the level of the distal colon, or ileus. No evidence of free gas in the abdomen seen radiographically. IMPRESSION: Sub pathologic gaseous distension of small bowel loops and colon which may represent a low-grade/intermediate obstruction at the level  of the distal colon, or ileus. Electronically Signed   By: Fidela Salisbury M.D.   On: 03/15/2019 18:37    Procedures Procedures (including critical care time)  Medications Ordered in ED Medications  lactated ringers infusion ( Intravenous New Bag/Given 03/15/19 1952)  ondansetron (ZOFRAN) injection 4 mg (4 mg Intravenous Given 03/15/19 1941)  pantoprazole (PROTONIX) injection 40 mg (40 mg Intravenous Given 03/15/19 1941)     Initial Impression / Assessment and Plan / ED Course  I have reviewed the triage vital signs and the nursing notes.  Pertinent labs &  imaging results that were available during my care of the patient were reviewed by me and considered in my medical decision making (see chart for details).       Consultation: Tried hospitalist for admission.  Patient has persistent problems with vomiting and abdominal pain.  Acute abdominal series suggests possible ileus or early obstruction.  Concern for possible chronic stricture or compression possibly malignant in nature.  Has had ongoing problems with this and this is been re-documented but she continues to worsen.  We will plan for admission.  Final Clinical Impressions(s) / ED Diagnoses   Final diagnoses:  Colon stricture [facility])    ED Discharge Orders    None       Charlesetta Shanks, MD 03/19/19 1430

## 2019-03-16 DIAGNOSIS — R933 Abnormal findings on diagnostic imaging of other parts of digestive tract: Secondary | ICD-10-CM

## 2019-03-16 DIAGNOSIS — K56699 Other intestinal obstruction unspecified as to partial versus complete obstruction: Secondary | ICD-10-CM | POA: Diagnosis present

## 2019-03-16 DIAGNOSIS — R1032 Left lower quadrant pain: Secondary | ICD-10-CM

## 2019-03-16 DIAGNOSIS — J449 Chronic obstructive pulmonary disease, unspecified: Secondary | ICD-10-CM

## 2019-03-16 LAB — BASIC METABOLIC PANEL
Anion gap: 10 (ref 5–15)
BUN: 9 mg/dL (ref 8–23)
CO2: 21 mmol/L — ABNORMAL LOW (ref 22–32)
Calcium: 7.6 mg/dL — ABNORMAL LOW (ref 8.9–10.3)
Chloride: 105 mmol/L (ref 98–111)
Creatinine, Ser: 0.57 mg/dL (ref 0.44–1.00)
GFR calc Af Amer: >60 mL/min (ref >60)
GFR calc non Af Amer: >60 mL/min (ref >60)
Glucose, Bld: 64 mg/dL — ABNORMAL LOW (ref 70–99)
Potassium: 4 mmol/L (ref 3.5–5.1)
Sodium: 136 mmol/L (ref 135–145)

## 2019-03-16 LAB — CBC
HCT: 35.2 % — ABNORMAL LOW (ref 36.0–46.0)
Hemoglobin: 11 g/dL — ABNORMAL LOW (ref 12.0–15.0)
MCH: 31.6 pg (ref 26.0–34.0)
MCHC: 31.3 g/dL (ref 30.0–36.0)
MCV: 101.1 fL — ABNORMAL HIGH (ref 80.0–100.0)
Platelets: 168 10*3/uL (ref 150–400)
RBC: 3.48 MIL/uL — ABNORMAL LOW (ref 3.87–5.11)
RDW: 14 % (ref 11.5–15.5)
WBC: 6.3 10*3/uL (ref 4.0–10.5)
nRBC: 0 % (ref 0.0–0.2)

## 2019-03-16 LAB — SARS CORONAVIRUS 2 (TAT 6-24 HRS): SARS Coronavirus 2: NEGATIVE

## 2019-03-16 MED ORDER — TRAMADOL HCL 50 MG PO TABS
ORAL_TABLET | ORAL | Status: AC
  Administered 2019-03-16: 02:00:00 50 mg via ORAL
  Filled 2019-03-16: qty 1

## 2019-03-16 MED ORDER — MORPHINE SULFATE (PF) 2 MG/ML IV SOLN
0.5000 mg | INTRAVENOUS | Status: DC | PRN
  Administered 2019-03-16 – 2019-03-23 (×15): 1 mg via INTRAVENOUS
  Filled 2019-03-16 (×15): qty 1

## 2019-03-16 MED ORDER — TRAMADOL HCL 50 MG PO TABS
50.0000 mg | ORAL_TABLET | Freq: Four times a day (QID) | ORAL | Status: DC | PRN
  Administered 2019-03-16 – 2019-03-27 (×14): 50 mg via ORAL
  Filled 2019-03-16 (×15): qty 1

## 2019-03-16 MED ORDER — DEXTROSE-NACL 5-0.9 % IV SOLN
INTRAVENOUS | Status: DC
  Administered 2019-03-16 – 2019-03-17 (×3): via INTRAVENOUS

## 2019-03-16 MED ORDER — LIP MEDEX EX OINT
TOPICAL_OINTMENT | CUTANEOUS | Status: AC
  Filled 2019-03-16: qty 7

## 2019-03-16 MED ORDER — POLYETHYLENE GLYCOL 3350 17 G PO PACK
17.0000 g | PACK | Freq: Two times a day (BID) | ORAL | Status: DC
  Administered 2019-03-16 – 2019-03-19 (×5): 17 g via ORAL
  Filled 2019-03-16 (×5): qty 1

## 2019-03-16 NOTE — Consult Note (Signed)
[facility] Surgery Consult Note  ANNORAH BRENCHLEY [id number]  [id number].    Requesting MD: Lottie Mussel Chief Complaint/Reason for consult: Abdominal pain/colitis/diverticulitis  HPI: Theresa Arnold is a 85 y.o. female with a history of recurrent diverticulitis that presented to [facility] long for abdominal pain and constipation.  Patient reports that over the last 1 week she has been having increasing lower abdominal pain/pressure that comes and goes.  She believes this was related to constipation as she had not had a bowel movement in the last 1 week.  She was seen in the ED on 8/29 and underwent a CT scan that showed a increased in the proximal sigmoid colon wall thickening representing a probable flareup of acute diverticulitis. This also showed an unchanged 3.5 cm intramural sigmoid colonic wall abscess/collection which was present on 11/02/2018. There was also noted to be some distention of the colon wall to the proximal sigmoid colon which may represent a low-grade obstruction with no evidence of pneumoperitoneum. She was discharged home from the ED on Cipro and Flagly with plans to follow-up with her PCP.  She returned to the ED last evening by ambulance with left lower quadrant pain and constipation. She also reported not being able to keep any food down for the last 4 days without N/V.  In the ED she was afebrile.  WBC 7.1.  Lactate was 1.5.  She was admitted to the medicine service, placed on bowel rest and started on IV Zosyn. She reports that last night she had a BM that significantly decreased her pain level.  She is now without any nausea.  WBC this am wnl at 6.3. We are asked to see. She denies previous abdominal surgeries. No blood thinner use.   She has previously been seen in the office by Dr. Nadeen Landau on 01/24/2019.  He reviewed her studies at that time and recommended she consider open robotic sigmoid colectomy.  She underwent at colonoscopy on 01/27/2019 by Dr. Tarri Glenn that  showed findings as listed below. She reports that she deferred surgery because of the risk of a colostomy. She has been on medical management during the interim taking MiraLAX 1 cap daily.  Colonoscopy Findings - 01/27/2019 - A moderate stenosis measuring of unknown length was found in the distal sigmoid colon and was traversed. This area is just proximal to the rectosigmoid junction. The area of the colon is redundant and difficult to transverse due to extensive diverticulosis. I was unable to traverse this area with a pediatric colonoscope. I could advance with a gastroscope with careful positioning of the patient. There is extensive diverticulosis in the sigmoid and descending colon. - A large polyp was found in the distal sigmoid colon at the distal end of the stenosis. The polyp could not safely be removed given the large volume of stool present. - Copious quantities of stool was found in the entire colon, interfering with visualization. Fluid aspiration was performed.  ROS: Review of Systems  Constitutional: Positive for weight loss. Negative for chills and fever.  Respiratory: Negative for cough and shortness of breath.   Cardiovascular: Negative for chest pain.  Gastrointestinal: Positive for abdominal pain, constipation, nausea and vomiting.  Genitourinary: Negative for dysuria.  All other systems reviewed and are negative.   Family History  Problem Relation Age of Onset  . Colon cancer Sister   . Breast cancer Sister   . Heart disease Father   . Hip fracture Mother   . Hypertension Other  Past Medical History:  Diagnosis Date  . CERVICAL RADICULOPATHY, LEFT 09/03/2010  . Cervical spondylosis 11/10/2011  . COPD (chronic obstructive pulmonary disease) ([place]) 11/10/2011  . HYPERLIPIDEMIA 09/03/2010    Past Surgical History:  Procedure Laterality Date  . COLONOSCOPY      Social History:  reports that she quit smoking about 7 years ago. Her smoking use included cigarettes.  She has a 30.00 pack-year smoking history. She has never used smokeless tobacco. She reports current alcohol use. She reports that she does not use drugs.  Allergies: No Known Allergies  (Not in a hospital admission)   Blood pressure 111/65, pulse 67, temperature 97.6 F (36.4 C), temperature source Oral, resp. rate 15, weight 47 kg, SpO2 98 %. Physical Exam: General: frail, elderly female who is laying in bed in NAD HEENT: head is normocephalic, atraumatic.  Sclera are noninjected.  PERRL.  Ears and nose without any masses or lesions.  Mouth is pink and moist Heart: regular, rate, and rhythm.  Normal s1,s2. No obvious murmurs, gallops, or rubs noted.  Palpable radial and pedal pulses bilaterally Lungs: CTAB, no wheezes, rhonchi, or rales noted.  Respiratory effort nonlabored Abd: Soft, mild lower abdominal distension, minimal tenderness of the LLQ, +BS, no masses, hernias, or organomegaly.  MS: all 4 extremities are symmetrical with no cyanosis, clubbing, or edema. Skin: warm and dry with no masses, lesions, or rashes Psych: A&Ox3 with an appropriate affect.  Results for orders placed or performed during the hospital encounter of 03/15/19 (from the past 48 hour(s))  Comprehensive metabolic panel     Status: Abnormal   Collection Time: 03/15/19  7:40 PM  Result Value Ref Range   Sodium 138 135 - 145 mmol/L   Potassium 3.1 (L) 3.5 - 5.1 mmol/L   Chloride 99 98 - 111 mmol/L   CO2 25 22 - 32 mmol/L   Glucose, Bld 86 70 - 99 mg/dL   BUN 11 8 - 23 mg/dL   Creatinine, Ser 0.62 0.44 - 1.00 mg/dL   Calcium 8.8 (L) 8.9 - 10.3 mg/dL   Total Protein 7.0 6.5 - 8.1 g/dL   Albumin 3.4 (L) 3.5 - 5.0 g/dL   AST 16 15 - 41 U/L   ALT 7 0 - 44 U/L   Alkaline Phosphatase 48 38 - 126 U/L   Total Bilirubin 1.1 0.3 - 1.2 mg/dL   GFR calc non Af Amer >60 >60 mL/min   GFR calc Af Amer >60 >60 mL/min   Anion gap 14 5 - 15    Comment: Performed at [facility], [place] [address],  [place], [place] [address]  CBC     Status: None   Collection Time: 03/15/19  7:40 PM  Result Value Ref Range   WBC 7.1 4.0 - 10.5 K/uL   RBC 4.31 3.87 - 5.11 MIL/uL   Hemoglobin 13.8 12.0 - 15.0 g/dL   HCT 42.7 36.0 - 46.0 %   MCV 99.1 80.0 - 100.0 fL   MCH 32.0 26.0 - 34.0 pg   MCHC 32.3 30.0 - 36.0 g/dL   RDW 13.7 11.5 - 15.5 %   Platelets 200 150 - 400 K/uL   nRBC 0.0 0.0 - 0.2 %    Comment: Performed at [facility], [place] [address] [place], [place] [address]  Lactic acid, plasma     Status: None   Collection Time: 03/15/19  7:40 PM  Result Value Ref Range   Lactic Acid, Venous 1.5 0.5 -  1.9 mmol/L    Comment: Performed at [facility], [place] [address], [place], [place] [address]  Magnesium     Status: Abnormal   Collection Time: 03/15/19  7:40 PM  Result Value Ref Range   Magnesium 2.6 (H) 1.7 - 2.4 mg/dL    Comment: Performed at [facility], [place] [address] [place], [place] [address]  SARS CORONAVIRUS 2 (TAT 6-24 HRS) Nasopharyngeal Nasopharyngeal Swab     Status: None   Collection Time: 03/15/19 10:22 PM   Specimen: Nasopharyngeal Swab  Result Value Ref Range   SARS Coronavirus 2 NEGATIVE NEGATIVE    Comment: (NOTE) SARS-CoV-2 target nucleic acids are NOT DETECTED. The SARS-CoV-2 RNA is generally detectable in upper and lower respiratory specimens during the acute phase of infection. Negative results do not preclude SARS-CoV-2 infection, do not rule out co-infections with other pathogens, and should not be used as the sole basis for treatment or other patient management decisions. Negative results must be combined with clinical observations, patient history, and epidemiological information. The expected result is Negative. Fact Sheet for Patients: SugarRoll.be Fact Sheet for Healthcare Providers: https://www.woods-mathews.com/ This test is not yet approved or cleared by the  [place] FDA and  has been authorized for detection and/or diagnosis of SARS-CoV-2 by FDA under an Emergency Use Authorization (EUA). This EUA will remain  in effect (meaning this test can be used) for the duration of the COVID-19 declaration under Section 56 4(b)(1) of the Act, 21 U.S.C. section 360bbb-3(b)(1), unless the authorization is terminated or revoked sooner. Performed at [facility], [place] [address], [place], [place] [address]   CBC     Status: Abnormal   Collection Time: 03/16/19  5:30 AM  Result Value Ref Range   WBC 6.3 4.0 - 10.5 K/uL   RBC 3.48 (L) 3.87 - 5.11 MIL/uL   Hemoglobin 11.0 (L) 12.0 - 15.0 g/dL   HCT 35.2 (L) 36.0 - 46.0 %   MCV 101.1 (H) 80.0 - 100.0 fL   MCH 31.6 26.0 - 34.0 pg   MCHC 31.3 30.0 - 36.0 g/dL   RDW 14.0 11.5 - 15.5 %   Platelets 168 150 - 400 K/uL   nRBC 0.0 0.0 - 0.2 %    Comment: Performed at [facility], [place] [address], [place], [place] [id number]  Basic metabolic panel     Status: Abnormal   Collection Time: 03/16/19  5:30 AM  Result Value Ref Range   Sodium 136 135 - 145 mmol/L   Potassium 4.0 3.5 - 5.1 mmol/L    Comment: DELTA CHECK NOTED REPEATED TO VERIFY NO VISIBLE HEMOLYSIS    Chloride 105 98 - 111 mmol/L   CO2 21 (L) 22 - 32 mmol/L   Glucose, Bld 64 (L) 70 - 99 mg/dL   BUN 9 8 - 23 mg/dL   Creatinine, Ser 0.57 0.44 - 1.00 mg/dL   Calcium 7.6 (L) 8.9 - 10.3 mg/dL   GFR calc non Af Amer >60 >60 mL/min   GFR calc Af Amer >60 >60 mL/min   Anion gap 10 5 - 15    Comment: Performed at [facility], [place] [address], [place], [place] [address]   Dg Abd Acute W/chest  Result Date: 03/15/2019 CLINICAL DATA:  Left lower quadrant pain. EXAM: DG ABDOMEN ACUTE W/ 1V CHEST COMPARISON:  Abdominal CT March 11, 2019 FINDINGS: Sub pathologic gaseous distension of small bowel loops and colon which may represent a  low-grade/intermediate obstruction at the level of the distal colon, or  ileus. No evidence of free gas in the abdomen seen radiographically. IMPRESSION: Sub pathologic gaseous distension of small bowel loops and colon which may represent a low-grade/intermediate obstruction at the level of the distal colon, or ileus. Electronically Signed   By: Fidela Salisbury M.D.   On: 03/15/2019 18:37   Anti-infectives (From admission, onward)   Start     Dose/Rate Route Frequency Ordered Stop   03/16/19 0600  piperacillin-tazobactam (ZOSYN) IVPB 3.375 g     3.375 g 12.5 mL/hr over 240 Minutes Intravenous Every 8 hours 03/15/19 2316     03/15/19 2315  piperacillin-tazobactam (ZOSYN) IVPB 3.375 g     3.375 g 100 mL/hr over 30 Minutes Intravenous NOW 03/15/19 2314 03/16/19 0038       Assessment/Plan HLD COPD  Recurrent diverticulitis with chronic fluid collection - Patient CT scan on 8/29 shows increase in proximal sigmoid, wall thickening questionable for acute diverticulitis at this area.  She has has had a normal WBC.  Her fluid collection appears unchanged since 4/22. 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  She has been seen at our office by Dr. Dema Severin and was offered surgery for this, but deferred.  No emergent surgery is indicated.  Will place patient on bowel regimen to help with her symptoms.  I feel it is reasonable to leave the patient on antibiotics for the wall thickening seen on CT which could represent an acute flare of her diverticulitis.  Will advance her diet.  Suspect that she will likely be able to go home in the next few days.  We will recommend that she follows back up in our office to rediscuss surgery as an outpatient.  We will continue to follow.  FEN: CLD ID: [email or web address] 03/15/2019 >> VTE: SCDs, okay for chemical prophylaxis from general surgery standpoint.  Inda Coke, [facility] 03/16/2019, 10:54 AM [phone]

## 2019-03-16 NOTE — Progress Notes (Signed)
PROGRESS NOTE    Theresa Arnold  [id number] DOB: February 01, 1932 DOA: 03/15/2019 PCP: Biagio Borg, MD    Brief Narrative:  Per Dr. Dondra Arnold I Theresa Arnold is a 85 y.o. female with medical history significant for COPD, hyperlipidemia, and chronic sigmoid diverticulitis with known abscess and stricture in the distal sigmoid colon who presented to the ED for evaluation of abdominal pain.  Patient follows with GI, Dr. Tarri Arnold, for known sigmoid diverticulitis and abscess.  Per GI documentation, case was discussed with IR and fluid collection was not considered amenable to IR drainage.  She underwent colonoscopy 01/27/2019 which was limited due to presence of stool.  Distal sigmoid stricture was seen.  She was reportedly referred to general surgery, [facility] Dr. Dema Arnold, and has no present plans for surgical intervention.  Patient was seen in the ED on 03/11/2019 at which time a repeat CT abdomen/pelvis with contrast showed an apparent increase in proximal sigmoid colon wall thickening and unchanged 3.5 cm intramural sigmoid colonic wall abscess/collection which was present since 11/02/2018.  There was question of low-grade obstruction. She was given a prescription for ciprofloxacin and metronidazole.  Patient stated that she has had continued generalized abdominal pain.  She has not had oral intake due to the nausea after eating.  She has been unable to take her home medications including the recently prescribed antibiotics.  She stated that she has not had any bowel movements despite use of MiraLAX for several days.  She reported a 30 pound weight loss since last year.  She has not had any subjective fevers.  She reported good urine output without dysuria.  ED Course:  Initial vitals showed BP 110/71, pulse 82, RR 23, temp 97.6 Fahrenheit, SPO2 98% on room air  Labs notable for potassium 3.1, magnesium 2.6, BUN 11, creatinine 0.62, WBC 7.1, hemoglobin 13.8, platelets 200,000.  Abdominal x-ray showed  sub-pathologic gaseous distention of small bowel loops and colon which may represent obstruction at the distal colon or ileus.  Patient was given IV fluids, Protonix, Zofran, and the hospitalist service was consulted to admit for further evaluation and management.   Assessment & Plan:   Principal Problem:   Sigmoid stricture (HCC) Active Problems:   Diverticulitis of sigmoid colon   COPD (chronic obstructive pulmonary disease) (HCC)  1 chronic/recurrent sigmoid diverticulitis with known abscess and distal sigmoid stricture Patient with recurrent symptoms.  Patient noted to have some intermittent abdominal pain with weight loss felt secondary to low-grade diverticular stricture.  Patient is being followed by GI who had previously recommended surgery and patient was referred to general surgery.  Patient did note to general surgery that she had some clinical improvement this morning.  Patient has been placed on a clear liquid diet and started on MiraLAX 17 g twice daily.  General surgery feels patient will benefit from resection given recurrence of her symptoms, weight loss.  Patient seems somewhat apprehensive per general surgery.  Continue IV fluids.  Supportive care.  Continue IV antibiotics.  General surgery and GI following and appreciate input and recommendations.  2.  Hypokalemia Likely secondary to GI losses.  Potassium repleted and currently at 4.0.  Magnesium noted to be 2.6 on admission.  Repeat labs in the morning.  3.  COPD Stable.  Albuterol as needed.   DVT prophylaxis: SCDs Code Status: Full Family Communication: Updated patient.  No family at bedside. Disposition Plan: To be determined.  Discharge when okay with GI and general surgery.   Consultants:  Gastroenterology: Dr. Tarri Arnold 03/16/2019  General surgery: Dr. Dema Arnold 03/16/2019  Procedures:   Acute abdominal series 03/15/2019    Antimicrobials:   IV Zosyn 03/15/2019   Subjective: Patient laying on gurney in  ED.  Patient states some improvement with abdominal pain and nausea however not at baseline.  Patient stated had a bowel movement this morning with slight symptomatic/clinical improvement.  Denies any chest pain or shortness of breath.  Objective: Vitals:   03/16/19 1100 03/16/19 1200 03/16/19 1400 03/16/19 1449  BP: 124/64 122/65 124/65 (!) 121/57  Pulse: 63 60 64 70  Resp: 16 15 16 16   Temp:    98.4 F (36.9 C)  TempSrc:      SpO2: 98% 98% 97% 99%  Weight:        Intake/Output Summary (Last 24 hours) at 03/16/2019 1643 Last data filed at 03/16/2019 0644 Gross per 24 hour  Intake 1089 ml  Output -  Net 1089 ml   Filed Weights   03/15/19 1632  Weight: 47 kg    Examination:  General exam: Appears calm and comfortable  Respiratory system: Clear to auscultation. Respiratory effort normal. Cardiovascular system: S1 & S2 heard, RRR. No JVD, murmurs, rubs, gallops or clicks. No pedal edema. Gastrointestinal system: Abdomen is soft, some diffuse tenderness to palpation, nondistended, positive bowel sounds.  No rebound.  No guarding.  Central nervous system: Alert and oriented. No focal neurological deficits. Extremities: Symmetric 5 x 5 power. Skin: No rashes, lesions or ulcers Psychiatry: Judgement and insight appear normal. Mood & affect appropriate.     Data Reviewed: I have personally reviewed following labs and imaging studies  CBC: Recent Labs  Lab 03/11/19 1906 03/15/19 1940 03/16/19 0530  WBC 7.0 7.1 6.3  HGB 13.0 13.8 11.0*  HCT 39.9 42.7 35.2*  MCV 98.3 99.1 101.1*  PLT 219 200 [id number]   Basic Metabolic Panel: Recent Labs  Lab 03/11/19 1906 03/15/19 1940 03/16/19 0530  NA 139 138 136  K 3.6 3.1* 4.0  CL 102 99 105  CO2 25 25 21*  GLUCOSE 83 86 64*  BUN 6* 11 9  CREATININE 0.58 0.62 0.57  CALCIUM 9.1 8.8* 7.6*  MG  --  2.6*  --    GFR: Estimated Creatinine Clearance: 38.8 mL/min (by C-G formula based on SCr of 0.57 mg/dL). Liver Function Tests:  Recent Labs  Lab 03/11/19 1906 03/15/19 1940  AST 21 16  ALT 7 7  ALKPHOS 51 48  BILITOT 0.4 1.1  PROT 6.7 7.0  ALBUMIN 3.2* 3.4*   No results for input(s): LIPASE, AMYLASE in the last 168 hours. No results for input(s): AMMONIA in the last 168 hours. Coagulation Profile: No results for input(s): INR, PROTIME in the last 168 hours. Cardiac Enzymes: No results for input(s): CKTOTAL, CKMB, CKMBINDEX, TROPONINI in the last 168 hours. BNP (last 3 results) No results for input(s): PROBNP in the last 8760 hours. HbA1C: No results for input(s): HGBA1C in the last 72 hours. CBG: No results for input(s): GLUCAP in the last 168 hours. Lipid Profile: No results for input(s): CHOL, HDL, LDLCALC, TRIG, CHOLHDL, LDLDIRECT in the last 72 hours. Thyroid Function Tests: No results for input(s): TSH, T4TOTAL, FREET4, T3FREE, THYROIDAB in the last 72 hours. Anemia Panel: No results for input(s): VITAMINB12, FOLATE, FERRITIN, TIBC, IRON, RETICCTPCT in the last 72 hours. Sepsis Labs: Recent Labs  Lab 03/15/19 1940  LATICACIDVEN 1.5    Recent Results (from the past 240 hour(s))  SARS CORONAVIRUS 2 (TAT 6-24  HRS) Nasopharyngeal Nasopharyngeal Swab     Status: None   Collection Time: 03/15/19 10:22 PM   Specimen: Nasopharyngeal Swab  Result Value Ref Range Status   SARS Coronavirus 2 NEGATIVE NEGATIVE Final    Comment: (NOTE) SARS-CoV-2 target nucleic acids are NOT DETECTED. The SARS-CoV-2 RNA is generally detectable in upper and lower respiratory specimens during the acute phase of infection. Negative results do not preclude SARS-CoV-2 infection, do not rule out co-infections with other pathogens, and should not be used as the sole basis for treatment or other patient management decisions. Negative results must be combined with clinical observations, patient history, and epidemiological information. The expected result is Negative. Fact Sheet for Patients:  SugarRoll.be Fact Sheet for Healthcare Providers: https://www.woods-mathews.com/ This test is not yet approved or cleared by the [place] FDA and  has been authorized for detection and/or diagnosis of SARS-CoV-2 by FDA under an Emergency Use Authorization (EUA). This EUA will remain  in effect (meaning this test can be used) for the duration of the COVID-19 declaration under Section 56 4(b)(1) of the Act, 21 U.S.C. section 360bbb-3(b)(1), unless the authorization is terminated or revoked sooner. Performed at [facility], [place] [address], [place], [place] [address]          Radiology Studies: Dg Abd Acute W/chest  Result Date: 03/15/2019 CLINICAL DATA:  Left lower quadrant pain. EXAM: DG ABDOMEN ACUTE W/ 1V CHEST COMPARISON:  Abdominal CT March 11, 2019 FINDINGS: Sub pathologic gaseous distension of small bowel loops and colon which may represent a low-grade/intermediate obstruction at the level of the distal colon, or ileus. No evidence of free gas in the abdomen seen radiographically. IMPRESSION: Sub pathologic gaseous distension of small bowel loops and colon which may represent a low-grade/intermediate obstruction at the level of the distal colon, or ileus. Electronically Signed   By: Fidela Salisbury M.D.   On: 03/15/2019 18:37        Scheduled Meds: . polyethylene glycol  17 g Oral BID   Continuous Infusions: . dextrose 5 % and 0.9% NaCl 100 mL/hr at 03/16/19 1510  . piperacillin-tazobactam (ZOSYN)  IV 3.375 g (03/16/19 1515)     LOS: 1 day    Time spent: 35 minutes    Irine Seal, MD [facility]  If 7PM-7AM, please contact night-coverage [email or web address] 03/16/2019, 4:43 PM

## 2019-03-16 NOTE — ED Notes (Signed)
Patient requesting her at home pain medication. Paged mid-level.

## 2019-03-16 NOTE — Progress Notes (Signed)
PHARMACY NOTE -  [facility] has been assisting with dosing of Zosyn for diverticulitis w/ abscess.  Dosage remains stable at 3.375 g IV q8 hr and need for further dosage adjustment appears unlikely at present given renal function stable at baseline  Pharmacy will sign off, following peripherally for culture results or dose adjustments. Please reconsult if a change in clinical status warrants re-evaluation of dosage.  Reuel Boom, PharmD, BCPS [phone] 03/16/2019, 1:07 PM

## 2019-03-16 NOTE — Consult Note (Signed)
Referring Provider: Dr. Grandville Silos, [facility] Primary Care Physician:  Biagio Borg, MD Primary Gastroenterologist:  Dr. Tarri Glenn  Reason for Consultation:  Diverticulitis, diverticular stricture  HPI: Theresa Arnold is a 85 y.o. female who is known to Dr. Tarri Glenn recently for issues with recurrent diverticulitis, sigmoid stricture, and complaints of pain related to that.  She presented to [facility] with complaints of lower abdominal pain and constipation.  Says that her last good BM was about a week ago and even that one was not great.  Was in the ED on 8/29 at which time she had CT scan performed and it showed the following:  IMPRESSION: 1. Apparent increase in proximal sigmoid colon wall thickening which may represent flare up of acute diverticulitis at this area. Unchanged 3.5 cm intramural sigmoid colonic wall abscess/collection, which has been present since 11/02/2018. Relative distension of the colon proximal to the sigmoid colon which may represent a low grade obstruction. No evidence of pneumoperitoneum. 2.  Aortic Atherosclerosis (ICD10-I70.0).  She improved in the ED so was sent home with cipro and flagyl.  Returned today with ongoing symptoms.  Had a BM here in the ED and had some mild improvement in abdominal pain.  Colonoscopy 01/2019 with Dr. Tarri Glenn showed the following:  - Preparation of the colon was inadequate. - Stricture in the distal sigmoid colon in the area of diverticulosis. - One polyp in the distal sigmoid colon. Not removed due to the large about of residual stool. - Stool in the entire examined colon. Fluid aspiration performed.  Had seen Dr. Dema Severin with [facility] as outpatient and they were discussing resection.  Surgery seeing her here as well.  Is on IV Zosyn here now and surgery has placed her on Miralax BID.     Past Medical History:  Diagnosis Date  . CERVICAL RADICULOPATHY, LEFT 09/03/2010  . Cervical spondylosis 11/10/2011  . COPD (chronic obstructive pulmonary  disease) ([place]) 11/10/2011  . HYPERLIPIDEMIA 09/03/2010    Past Surgical History:  Procedure Laterality Date  . COLONOSCOPY      Prior to Admission medications   Medication Sig Start Date End Date Taking? Authorizing Provider  ciprofloxacin (CIPRO) 500 MG tablet Take 1 tablet (500 mg total) by mouth 2 (two) times daily. 03/11/19  Yes Lajean Saver, MD  metroNIDAZOLE (FLAGYL) 500 MG tablet Take 1 tablet (500 mg total) by mouth 4 (four) times daily. 03/11/19  Yes Lajean Saver, MD  polyethylene glycol (MIRALAX / GLYCOLAX) 17 g packet Take 17 g by mouth daily as needed for mild constipation.   Yes [provider]  traMADol (ULTRAM) 50 MG tablet Take 1 tablet (50 mg total) by mouth every 6 (six) hours as needed. Patient taking differently: Take 50 mg by mouth every 6 (six) hours as needed for moderate pain.  01/06/19  Yes Thornton Park, MD  dicyclomine (BENTYL) 10 MG capsule TAKE 1 CAPSULE (10 MG TOTAL) BY MOUTH 3 (THREE) TIMES DAILY AS NEEDED FOR SPASMS. Patient not taking: Reported on 11/22/2018 10/31/18   Biagio Borg, MD  ondansetron (ZOFRAN) 4 MG tablet Take 1 tablet (4 mg total) by mouth every 8 (eight) hours as needed for nausea or vomiting. Patient not taking: Reported on 11/22/2018 11/02/18   Biagio Borg, MD    Current Facility-Administered Medications  Medication Dose Route Frequency Provider Last Rate Last Dose  . 0.9 %  sodium chloride infusion  500 mL Intravenous Once Thornton Park, MD      . acetaminophen (TYLENOL) tablet  650 mg  650 mg Oral Q6H PRN Lenore Cordia, MD   325 mg at 03/16/19 [id number]   Or  . acetaminophen (TYLENOL) suppository 650 mg  650 mg Rectal Q6H PRN Lenore Cordia, MD      . albuterol (VENTOLIN HFA) 108 (90 Base) MCG/ACT inhaler 1-2 puff  1-2 puff Inhalation Q6H PRN Zada Finders R, MD      . dextrose 5 %-0.9 % sodium chloride infusion   Intravenous Continuous Eugenie Filler, MD      . morphine 2 MG/ML injection 0.5-1 mg  0.5-1 mg Intravenous  Q4H PRN Eugenie Filler, MD      . ondansetron [facility]) tablet 4 mg  4 mg Oral Q6H PRN Lenore Cordia, MD       Or  . ondansetron (ZOFRAN) injection 4 mg  4 mg Intravenous Q6H PRN Zada Finders R, MD      . piperacillin-tazobactam (ZOSYN) IVPB 3.375 g  3.375 g Intravenous Q8H Dorrene German, [place]   Stopped at 03/16/19 1139  . polyethylene glycol (MIRALAX / GLYCOLAX) packet 17 g  17 g Oral BID Jillyn Ledger, PA-C   17 g at 03/16/19 1139  . traMADol (ULTRAM) tablet 50 mg  50 mg Oral Q6H PRN Bodenheimer, Charles A, NP   50 mg at 03/16/19 1139   Current Outpatient Medications  Medication Sig Dispense Refill  . ciprofloxacin (CIPRO) 500 MG tablet Take 1 tablet (500 mg total) by mouth 2 (two) times daily. 20 tablet 0  . metroNIDAZOLE (FLAGYL) 500 MG tablet Take 1 tablet (500 mg total) by mouth 4 (four) times daily. 40 tablet 0  . polyethylene glycol (MIRALAX / GLYCOLAX) 17 g packet Take 17 g by mouth daily as needed for mild constipation.    . traMADol (ULTRAM) 50 MG tablet Take 1 tablet (50 mg total) by mouth every 6 (six) hours as needed. (Patient taking differently: Take 50 mg by mouth every 6 (six) hours as needed for moderate pain. ) 60 tablet 2  . dicyclomine (BENTYL) 10 MG capsule TAKE 1 CAPSULE (10 MG TOTAL) BY MOUTH 3 (THREE) TIMES DAILY AS NEEDED FOR SPASMS. (Patient not taking: Reported on 11/22/2018) 270 capsule 1  . ondansetron (ZOFRAN) 4 MG tablet Take 1 tablet (4 mg total) by mouth every 8 (eight) hours as needed for nausea or vomiting. (Patient not taking: Reported on 11/22/2018) 30 tablet 0    Allergies as of 03/15/2019  . (No Known Allergies)    Family History  Problem Relation Age of Onset  . Colon cancer Sister   . Breast cancer Sister   . Heart disease Father   . Hip fracture Mother   . Hypertension Other     Social History   Socioeconomic History  . Marital status: Married    Spouse name: Ernie  . Number of children: 1  . Years of education: 29  .  Highest education level: Not on file  Occupational History  . Occupation: retired  Scientific laboratory technician  . Financial resource strain: Not on file  . Food insecurity    Worry: Not on file    Inability: Not on file  . Transportation needs    Medical: Not on file    Non-medical: Not on file  Tobacco Use  . Smoking status: Former Smoker    Packs/day: 1.00    Years: 30.00    Pack years: 30.00    Types: Cigarettes    Quit date: 07/14/2011  Years since quitting: 7.6  . Smokeless tobacco: Never Used  . Tobacco comment: QUIT 2013   Substance and Sexual Activity  . Alcohol use: Yes    Comment: wine occasionally  . Drug use: No  . Sexual activity: Not on file  Lifestyle  . Physical activity    Days per week: Not on file    Minutes per session: Not on file  . Stress: Not on file  Relationships  . Social Herbalist on phone: Not on file    Gets together: Not on file    Attends religious service: Not on file    Active member of club or organization: Not on file    Attends meetings of clubs or organizations: Not on file    Relationship status: Not on file  . Intimate partner violence    Fear of current or ex partner: Not on file    Emotionally abused: Not on file    Physically abused: Not on file    Forced sexual activity: Not on file  Other Topics Concern  . Not on file  Social History Narrative   Patient lives at home with spouseSharman Crate)   Patient is retired.   Patient has a college education.   Patient has one adult child.   Patient is right-handed   Caffeine Use: 1 cup daily    Review of Systems: ROS is negative except as mentioned in HPI.  Physical Exam: Vital signs in last 24 hours: Temp:  [97.6 F (36.4 C)] 97.6 F (36.4 C) (09/02 1627) Pulse Rate:  [53-84] 63 (09/03 1100) Resp:  [15-34] 16 (09/03 1100) BP: (110-135)/(60-79) 124/64 (09/03 1100) SpO2:  [96 %-99 %] 98 % (09/03 1100) Weight:  [47 kg] 47 kg (09/02 1632)   General:  Alert, Well-developed,  well-nourished, pleasant and cooperative in NAD Head:  Normocephalic and atraumatic. Eyes:  Sclera clear, no icterus.  Conjunctiva pink. Ears:  Normal auditory acuity. Mouth:  No deformity or lesions.   Lungs:  Clear throughout to auscultation.  No wheezes, crackles, or rhonchi.  Heart:  Regular rate and rhythm; no murmurs, clicks, rubs, or gallops. Abdomen:  Soft, non-distended.  BS present.  Mild lower abdominal TTP.  Msk:  Symmetrical without gross deformities. Pulses:  Normal pulses noted. Extremities:  Without clubbing or edema. Neurologic:  Alert and oriented x 4;  grossly normal neurologically. Skin:  Intact without significant lesions or rashes. Psych:  Alert and cooperative. Normal mood and affect.  Intake/Output from previous day: 09/02 0701 - 09/03 0700 In: 1089 [I.V.:519; IV Piggyback:570] Out: -   Lab Results: Recent Labs    03/15/19 1940 03/16/19 0530  WBC 7.1 6.3  HGB 13.8 11.0*  HCT 42.7 35.2*  PLT 200 168   BMET Recent Labs    03/15/19 1940 03/16/19 0530  NA 138 136  K 3.1* 4.0  CL 99 105  CO2 25 21*  GLUCOSE 86 64*  BUN 11 9  CREATININE 0.62 0.57  CALCIUM 8.8* 7.6*   LFT Recent Labs    03/15/19 1940  PROT 7.0  ALBUMIN 3.4*  AST 16  ALT 7  ALKPHOS 48  BILITOT 1.1   Studies/Results: Dg Abd Acute W/chest  Result Date: 03/15/2019 CLINICAL DATA:  Left lower quadrant pain. EXAM: DG ABDOMEN ACUTE W/ 1V CHEST COMPARISON:  Abdominal CT March 11, 2019 FINDINGS: Sub pathologic gaseous distension of small bowel loops and colon which may represent a low-grade/intermediate obstruction at the level of the distal  colon, or ileus. No evidence of free gas in the abdomen seen radiographically. IMPRESSION: Sub pathologic gaseous distension of small bowel loops and colon which may represent a low-grade/intermediate obstruction at the level of the distal colon, or ileus. Electronically Signed   By: Fidela Salisbury M.D.   On: 03/15/2019 18:37    IMPRESSION:   *85 year old female with complaints of abdominal pain and constipation, recurrent diverticulitis with fluid collection and sigmoid stricture:  CT scan with possible acute flare.  Symptoms mildly improved since having a BM here in the ED.  PLAN: -Surgery saw the patient here as well and we appreciate their input. -Agree with IV abx and conservative measures. -Would maintain on Miralax once or twice daily. -Likely needs a low residue/low fiber/soft diet long-term particularly if she declines surgery, but I think that she will continue to have recurrence unless resection is performed.  Laban Emperor. Mckay Tegtmeyer  03/16/2019, 11:47 AM

## 2019-03-16 NOTE — ED Notes (Signed)
ED TO INPATIENT HANDOFF REPORT  ED Nurse Name and Phone #: [phone]  S Name/Age/Gender Theresa Arnold 85 y.o. female Room/Bed: WA12/WA12  Code Status   Code Status: Full Code  Home/SNF/Other Home Patient oriented to: self, place, time and situation Is this baseline? Yes   Triage Complete: Triage complete  Chief Complaint Abdominal Pain; Covid Symptoms  Triage Note Pt BIBA from home. Pt c/o LLQ pain. Pt has hx of diverticulosis. Pt was seen at [facility] 4 days ago for same.  Pt states she has not had a BM "Since last week". Pt states that her stomach feels bloated as well. Pt was given cipro. Pt states she has been unable to take for 4 days, stating that she cannot keep anything down. Pt is normally independent.    Allergies No Known Allergies  Level of Care/Admitting Diagnosis ED Disposition    ED Disposition Condition [facility] Area: [place] [[id number]]  Level of Care: Med-Surg [16]  Covid Evaluation: Asymptomatic Screening Protocol (No Symptoms)  Diagnosis: Diverticulitis of sigmoid colon [id number]  Admitting Physician: Lenore Cordia [id number]  Attending Physician: Lenore Cordia [id number]  Estimated length of stay: past midnight tomorrow  Certification:: I certify this patient will need inpatient services for at least 2 midnights  PT Class (Do Not Modify): Inpatient [101]  PT Acc Code (Do Not Modify): Private [1]       B Medical/Surgery History Past Medical History:  Diagnosis Date  . CERVICAL RADICULOPATHY, LEFT 09/03/2010  . Cervical spondylosis 11/10/2011  . COPD (chronic obstructive pulmonary disease) ([place]) 11/10/2011  . HYPERLIPIDEMIA 09/03/2010   Past Surgical History:  Procedure Laterality Date  . COLONOSCOPY       A IV Location/Drains/Wounds Patient Lines/Drains/Airways Status   Active Line/Drains/Airways    Name:   Placement date:   Placement time:   Site:   Days:   Peripheral IV 03/16/19 Left;Upper Forearm    03/16/19    1238    Forearm   less than 1          Intake/Output Last 24 hours  Intake/Output Summary (Last 24 hours) at 03/16/2019 1411 Last data filed at 03/16/2019 0644 Gross per 24 hour  Intake 1089 ml  Output -  Net 1089 ml    Labs/Imaging Results for orders placed or performed during the hospital encounter of 03/15/19 (from the past 48 hour(s))  Comprehensive metabolic panel     Status: Abnormal   Collection Time: 03/15/19  7:40 PM  Result Value Ref Range   Sodium 138 135 - 145 mmol/L   Potassium 3.1 (L) 3.5 - 5.1 mmol/L   Chloride 99 98 - 111 mmol/L   CO2 25 22 - 32 mmol/L   Glucose, Bld 86 70 - 99 mg/dL   BUN 11 8 - 23 mg/dL   Creatinine, Ser 0.62 0.44 - 1.00 mg/dL   Calcium 8.8 (L) 8.9 - 10.3 mg/dL   Total Protein 7.0 6.5 - 8.1 g/dL   Albumin 3.4 (L) 3.5 - 5.0 g/dL   AST 16 15 - 41 U/L   ALT 7 0 - 44 U/L   Alkaline Phosphatase 48 38 - 126 U/L   Total Bilirubin 1.1 0.3 - 1.2 mg/dL   GFR calc non Af Amer >60 >60 mL/min   GFR calc Af Amer >60 >60 mL/min   Anion gap 14 5 - 15    Comment: Performed at [facility], [address] [place], [place]  [id number]  CBC     Status: None   Collection Time: 03/15/19  7:40 PM  Result Value Ref Range   WBC 7.1 4.0 - 10.5 K/uL   RBC 4.31 3.87 - 5.11 MIL/uL   Hemoglobin 13.8 12.0 - 15.0 g/dL   HCT 42.7 36.0 - 46.0 %   MCV 99.1 80.0 - 100.0 fL   MCH 32.0 26.0 - 34.0 pg   MCHC 32.3 30.0 - 36.0 g/dL   RDW 13.7 11.5 - 15.5 %   Platelets 200 150 - 400 K/uL   nRBC 0.0 0.0 - 0.2 %    Comment: Performed at [facility], [place] [address] [place], [place] [address]  Lactic acid, plasma     Status: None   Collection Time: 03/15/19  7:40 PM  Result Value Ref Range   Lactic Acid, Venous 1.5 0.5 - 1.9 mmol/L    Comment: Performed at [facility], [place] [address], [place], [place] [address]  Magnesium     Status: Abnormal   Collection Time: 03/15/19  7:40 PM  Result Value Ref Range    Magnesium 2.6 (H) 1.7 - 2.4 mg/dL    Comment: Performed at [facility], [place] [address], [place], [place] [address]  SARS CORONAVIRUS 2 (TAT 6-24 HRS) Nasopharyngeal Nasopharyngeal Swab     Status: None   Collection Time: 03/15/19 10:22 PM   Specimen: Nasopharyngeal Swab  Result Value Ref Range   SARS Coronavirus 2 NEGATIVE NEGATIVE    Comment: (NOTE) SARS-CoV-2 target nucleic acids are NOT DETECTED. The SARS-CoV-2 RNA is generally detectable in upper and lower respiratory specimens during the acute phase of infection. Negative results do not preclude SARS-CoV-2 infection, do not rule out co-infections with other pathogens, and should not be used as the sole basis for treatment or other patient management decisions. Negative results must be combined with clinical observations, patient history, and epidemiological information. The expected result is Negative. Fact Sheet for Patients: SugarRoll.be Fact Sheet for Healthcare Providers: https://www.woods-mathews.com/ This test is not yet approved or cleared by the [place] FDA and  has been authorized for detection and/or diagnosis of SARS-CoV-2 by FDA under an Emergency Use Authorization (EUA). This EUA will remain  in effect (meaning this test can be used) for the duration of the COVID-19 declaration under Section 56 4(b)(1) of the Act, 21 U.S.C. section 360bbb-3(b)(1), unless the authorization is terminated or revoked sooner. Performed at [facility], [place] [address] [place], [place] [address]   CBC     Status: Abnormal   Collection Time: 03/16/19  5:30 AM  Result Value Ref Range   WBC 6.3 4.0 - 10.5 K/uL   RBC 3.48 (L) 3.87 - 5.11 MIL/uL   Hemoglobin 11.0 (L) 12.0 - 15.0 g/dL   HCT 35.2 (L) 36.0 - 46.0 %   MCV 101.1 (H) 80.0 - 100.0 fL   MCH 31.6 26.0 - 34.0 pg   MCHC 31.3 30.0 - 36.0 g/dL   RDW 14.0 11.5 - 15.5 %   Platelets 168 150 - 400 K/uL   nRBC  0.0 0.0 - 0.2 %    Comment: Performed at [facility], [place] [address] [place], [place] [id number]  Basic metabolic panel     Status: Abnormal   Collection Time: 03/16/19  5:30 AM  Result Value Ref Range   Sodium 136 135 - 145 mmol/L   Potassium 4.0 3.5 - 5.1 mmol/L    Comment: DELTA CHECK  NOTED REPEATED TO VERIFY NO VISIBLE HEMOLYSIS    Chloride 105 98 - 111 mmol/L   CO2 21 (L) 22 - 32 mmol/L   Glucose, Bld 64 (L) 70 - 99 mg/dL   BUN 9 8 - 23 mg/dL   Creatinine, Ser 0.57 0.44 - 1.00 mg/dL   Calcium 7.6 (L) 8.9 - 10.3 mg/dL   GFR calc non Af Amer >60 >60 mL/min   GFR calc Af Amer >60 >60 mL/min   Anion gap 10 5 - 15    Comment: Performed at [facility], [place] [address], [place],  [address]   Dg Abd Acute W/chest  Result Date: 03/15/2019 CLINICAL DATA:  Left lower quadrant pain. EXAM: DG ABDOMEN ACUTE W/ 1V CHEST COMPARISON:  Abdominal CT March 11, 2019 FINDINGS: Sub pathologic gaseous distension of small bowel loops and colon which may represent a low-grade/intermediate obstruction at the level of the distal colon, or ileus. No evidence of free gas in the abdomen seen radiographically. IMPRESSION: Sub pathologic gaseous distension of small bowel loops and colon which may represent a low-grade/intermediate obstruction at the level of the distal colon, or ileus. Electronically Signed   By: Fidela Salisbury M.D.   On: 03/15/2019 18:37    Pending Labs Unresulted Labs (From admission, onward)   None      Vitals/Pain Today's Vitals   03/16/19 1100 03/16/19 1200 03/16/19 1240 03/16/19 1400  BP: 124/64 122/65  124/65  Pulse: 63 60  64  Resp: 16 15  16   Temp:      TempSrc:      SpO2: 98% 98%  97%  Weight:      PainSc:   3      Isolation Precautions No active isolations  Medications Medications  acetaminophen (TYLENOL) tablet 650 mg (325 mg Oral Given 03/16/19 0226)    Or  acetaminophen (TYLENOL) suppository 650 mg ( Rectal See  Alternative 03/16/19 0226)  ondansetron (ZOFRAN) tablet 4 mg (has no administration in time range)    Or  ondansetron (ZOFRAN) injection 4 mg (has no administration in time range)  potassium chloride 10 mEq in 100 mL IVPB (0 mEq Intravenous Stopped 03/16/19 0644)  albuterol (VENTOLIN HFA) 108 (90 Base) MCG/ACT inhaler 1-2 puff (has no administration in time range)  piperacillin-tazobactam (ZOSYN) IVPB 3.375 g (0 g Intravenous Stopped 03/16/19 1139)  traMADol (ULTRAM) tablet 50 mg (50 mg Oral Given 03/16/19 1139)  dextrose 5 %-0.9 % sodium chloride infusion ( Intravenous Not Given 03/16/19 1139)  morphine 2 MG/ML injection 0.5-1 mg (has no administration in time range)  polyethylene glycol (MIRALAX / GLYCOLAX) packet 17 g (17 g Oral Given 03/16/19 1139)  ondansetron (ZOFRAN) injection 4 mg (4 mg Intravenous Given 03/15/19 1941)  pantoprazole (PROTONIX) injection 40 mg (40 mg Intravenous Given 03/15/19 1941)  piperacillin-tazobactam (ZOSYN) IVPB 3.375 g (0 g Intravenous Stopped 03/16/19 0038)    Mobility walks Low fall risk   Focused Assessments ABD    R Recommendations: See Admitting Provider Note  Report given to:   Additional Notes:

## 2019-03-17 DIAGNOSIS — K56699 Other intestinal obstruction unspecified as to partial versus complete obstruction: Secondary | ICD-10-CM

## 2019-03-17 DIAGNOSIS — R933 Abnormal findings on diagnostic imaging of other parts of digestive tract: Secondary | ICD-10-CM | POA: Diagnosis present

## 2019-03-17 LAB — CBC WITH DIFFERENTIAL/PLATELET
Abs Immature Granulocytes: 0.01 10*3/uL (ref 0.00–0.07)
Basophils Absolute: 0 10*3/uL (ref 0.0–0.1)
Basophils Relative: 0 %
Eosinophils Absolute: 0.1 10*3/uL (ref 0.0–0.5)
Eosinophils Relative: 3 %
HCT: 31.7 % — ABNORMAL LOW (ref 36.0–46.0)
Hemoglobin: 10.1 g/dL — ABNORMAL LOW (ref 12.0–15.0)
Immature Granulocytes: 0 %
Lymphocytes Relative: 38 %
Lymphs Abs: 1.8 10*3/uL (ref 0.7–4.0)
MCH: 31.8 pg (ref 26.0–34.0)
MCHC: 31.9 g/dL (ref 30.0–36.0)
MCV: 99.7 fL (ref 80.0–100.0)
Monocytes Absolute: 0.4 10*3/uL (ref 0.1–1.0)
Monocytes Relative: 9 %
Neutro Abs: 2.4 10*3/uL (ref 1.7–7.7)
Neutrophils Relative %: 50 %
Platelets: 156 10*3/uL (ref 150–400)
RBC: 3.18 MIL/uL — ABNORMAL LOW (ref 3.87–5.11)
RDW: 14.1 % (ref 11.5–15.5)
WBC: 4.8 10*3/uL (ref 4.0–10.5)
nRBC: 0 % (ref 0.0–0.2)

## 2019-03-17 LAB — BASIC METABOLIC PANEL
Anion gap: 5 (ref 5–15)
BUN: <5 mg/dL — ABNORMAL LOW (ref 8–23)
CO2: 25 mmol/L (ref 22–32)
Calcium: 7.7 mg/dL — ABNORMAL LOW (ref 8.9–10.3)
Chloride: 109 mmol/L (ref 98–111)
Creatinine, Ser: 0.47 mg/dL (ref 0.44–1.00)
GFR calc Af Amer: >60 mL/min (ref >60)
GFR calc non Af Amer: >60 mL/min (ref >60)
Glucose, Bld: 130 mg/dL — ABNORMAL HIGH (ref 70–99)
Potassium: 3 mmol/L — ABNORMAL LOW (ref 3.5–5.1)
Sodium: 139 mmol/L (ref 135–145)

## 2019-03-17 LAB — GLUCOSE, CAPILLARY: Glucose-Capillary: 100 mg/dL — ABNORMAL HIGH (ref 70–99)

## 2019-03-17 MED ORDER — POTASSIUM CHLORIDE CRYS ER 20 MEQ PO TBCR
40.0000 meq | EXTENDED_RELEASE_TABLET | ORAL | Status: AC
  Administered 2019-03-17 (×2): 40 meq via ORAL
  Filled 2019-03-17 (×2): qty 2

## 2019-03-17 NOTE — Progress Notes (Signed)
CC: Abdominal pain  Subjective: Patient still bloated and distended.  She is not taking the clears she says it makes her feel like she wants to vomit.  Minimal flatus no BM.  She remains tender all over.  Objective: Vital signs in last 24 hours: Temp:  [97.7 F (36.5 C)-98.9 F (37.2 C)] 97.7 F (36.5 C) (09/04 0539) Pulse Rate:  [57-70] 57 (09/04 0539) Resp:  [14-16] 16 (09/04 0539) BP: (103-124)/(57-65) 103/58 (09/04 0539) SpO2:  [97 %-100 %] 100 % (09/04 0539) Weight:  [47 kg-50.7 kg] 50.7 kg (09/04 0700) Last BM Date: 03/16/19 IV 382 Urine 800 Afebrile vital signs are stable Potassium is 3.0 glucose 130 BUN less than 5 calcium 7.7 WBC 4.8, hemoglobin 10.1, hematocrit 31.7 platelets 156,000 Film 03/15/2019-distention small bowel loops and colon which may represent low-grade intermittent obstruction at the level of the distal colon  Intake/Output from previous day: 09/03 0701 - 09/04 0700 In: 382.3 [I.V.:281.6; IV Piggyback:100.6] Out: 800 [Urine:800] Intake/Output this shift: No intake/output data recorded.  General appearance: alert, cooperative and Ongoing discomfort from abdominal distention.  Mild nausea just smelling the clear liquids. GI: Still distended and has generalized tenderness.  She does have bowel sounds that are hyperactive.  Minimal flatus no BM.  Lab Results:  Recent Labs    03/16/19 0530 03/17/19 0333  WBC 6.3 4.8  HGB 11.0* 10.1*  HCT 35.2* 31.7*  PLT 168 156    BMET Recent Labs    03/16/19 0530 03/17/19 0333  NA 136 139  K 4.0 3.0*  CL 105 109  CO2 21* 25  GLUCOSE 64* 130*  BUN 9 <5*  CREATININE 0.57 0.47  CALCIUM 7.6* 7.7*   PT/INR No results for input(s): LABPROT, INR in the last 72 hours.  Recent Labs  Lab 03/11/19 1906 03/15/19 1940  AST 21 16  ALT 7 7  ALKPHOS 51 48  BILITOT 0.4 1.1  PROT 6.7 7.0  ALBUMIN 3.2* 3.4*     Lipase     Component Value Date/Time   LIPASE 11.0 11/02/2018 1103     Medications: .  polyethylene glycol  17 g Oral BID  . potassium chloride  40 mEq Oral Q4H    Assessment/Plan HLD COPD Hypokalemia -  potassium being replaced  Recurrent diverticulitis with chronic fluid collection - Patient CT scan on 8/29 shows increase in proximal sigmoid, wall thickening questionable for acute diverticulitis at this area.  She has has had a normal WBC.  Her fluid collection appears unchanged since 4/22. 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  She has been seen at our office by Dr. Dema Severin and was offered surgery for this, but deferred.  No emergent surgery is indicated.  Will place patient on bowel regimen to help with her symptoms.  I feel it is reasonable to leave the patient on antibiotics for the wall thickening seen on CT which could represent an acute flare of her diverticulitis.  Will advance her diet.  Suspect that she will likely be able to go home in the next few days.  We will recommend that she follows back up in our office to rediscuss surgery as an outpatient.  We will continue to follow.  FEN: Clear liquids but she cannot really tolerate them.  >> full liquids ID: Zosyn 03/15/2019 >> VTE: SCDs, okay for chemical prophylaxis from general surgery standpoint.   Plan: Try her on some full liquids, mobilize, her  potassium is being replaced. Recheck film in a.m.,  Recheck labs in a.m.   LOS: 2 days    Amerika Nourse 03/17/2019 [phone]

## 2019-03-17 NOTE — Progress Notes (Signed)
     [facility] Progress Note  CC:  Diverticulitis, diverticular stricture  Subjective:  Feeling somewhat better today after having another BM and walking around the halls with PT.  Clear liquids were not tasty and made her feel like she could vomit so surgery placed her on full liquids instead.  Objective:  Vital signs in last 24 hours: Temp:  [97.7 F (36.5 C)-98.9 F (37.2 C)] 97.7 F (36.5 C) (09/04 0539) Pulse Rate:  [57-70] 57 (09/04 0539) Resp:  [14-16] 16 (09/04 0539) BP: (103-124)/(57-65) 103/58 (09/04 0539) SpO2:  [97 %-100 %] 100 % (09/04 0539) Weight:  [47 kg-50.7 kg] 50.7 kg (09/04 0700) Last BM Date: 03/16/19 General:   Alert,  Well-developed, in NAD Heart:  Regular rate and rhythm; no murmurs Pulm:  CTAB.  No increased WOB. Abdomen:  Soft, slightly distended.  BS present.  Mild diffuse TTP. Extremities:  Without edema. Neurologic:  Alert and oriented x 4;  grossly normal neurologically. Psych:  Alert and cooperative. Normal mood and affect.  Intake/Output from previous day: 09/03 0701 - 09/04 0700 In: 382.3 [I.V.:281.6; IV Piggyback:100.6] Out: 800 [Urine:800]  Lab Results: Recent Labs    03/15/19 1940 03/16/19 0530 03/17/19 0333  WBC 7.1 6.3 4.8  HGB 13.8 11.0* 10.1*  HCT 42.7 35.2* 31.7*  PLT 200 168 156   BMET Recent Labs    03/15/19 1940 03/16/19 0530 03/17/19 0333  NA 138 136 139  K 3.1* 4.0 3.0*  CL 99 105 109  CO2 25 21* 25  GLUCOSE 86 64* 130*  BUN 11 9 <5*  CREATININE 0.62 0.57 0.47  CALCIUM 8.8* 7.6* 7.7*   LFT Recent Labs    03/15/19 1940  PROT 7.0  ALBUMIN 3.4*  AST 16  ALT 7  ALKPHOS 48  BILITOT 1.1   Dg Abd Acute W/chest  Result Date: 03/15/2019 CLINICAL DATA:  Left lower quadrant pain. EXAM: DG ABDOMEN ACUTE W/ 1V CHEST COMPARISON:  Abdominal CT March 11, 2019 FINDINGS: Sub pathologic gaseous distension of small bowel loops and colon which may represent a low-grade/intermediate obstruction at the  level of the distal colon, or ileus. No evidence of free gas in the abdomen seen radiographically. IMPRESSION: Sub pathologic gaseous distension of small bowel loops and colon which may represent a low-grade/intermediate obstruction at the level of the distal colon, or ileus. Electronically Signed   By: Fidela Salisbury M.D.   On: 03/15/2019 18:37   Assessment / Plan: *85 year old female with complaints of abdominal pain and constipation, recurrent diverticulitis with fluid collection and sigmoid stricture:  CT scan with possible acute flare.  Symptoms mildly improved some more today. *Hypokalemia:  3.0 this AM.  Replacement per primary service.  Ideal to keep it at 4.0 or above for optimal gut function.  -Surgery saw the patient here as well and we appreciate their input. -Agree with IV abx and conservative measures. -Would maintain on Miralax once or twice daily. -Likely needs a low residue/low fiber/soft diet long-term particularly if she declines surgery, but I think that she will continue to have recurrence unless resection is performed. -Worked with PT today and was ambulating the halls.  To continue ambulating, OOB. -Surgery has ordered repeat abdominal x-ray for AM.   LOS: 2 days   Laban Emperor. Shonte Beutler  03/17/2019, 9:25 AM

## 2019-03-17 NOTE — Progress Notes (Signed)
PROGRESS NOTE    Theresa Arnold  [id number] DOB: 1931/08/09 DOA: 03/15/2019 PCP: Biagio Borg, MD    Brief Narrative:  Per Dr. Dondra Prader I [name] is a 85 y.o. female with medical history significant for COPD, hyperlipidemia, and chronic sigmoid diverticulitis with known abscess and stricture in the distal sigmoid colon who presented to the ED for evaluation of abdominal pain.  Patient follows with GI, Dr. Tarri Glenn, for known sigmoid diverticulitis and abscess.  Per GI documentation, case was discussed with IR and fluid collection was not considered amenable to IR drainage.  She underwent colonoscopy 01/27/2019 which was limited due to presence of stool.  Distal sigmoid stricture was seen.  She was reportedly referred to general surgery, [facility] Dr. Dema Severin, and has no present plans for surgical intervention.  Patient was seen in the ED on 03/11/2019 at which time a repeat CT abdomen/pelvis with contrast showed an apparent increase in proximal sigmoid colon wall thickening and unchanged 3.5 cm intramural sigmoid colonic wall abscess/collection which was present since 11/02/2018.  There was question of low-grade obstruction. She was given a prescription for ciprofloxacin and metronidazole.  Patient stated that she has had continued generalized abdominal pain.  She has not had oral intake due to the nausea after eating.  She has been unable to take her home medications including the recently prescribed antibiotics.  She stated that she has not had any bowel movements despite use of MiraLAX for several days.  She reported a 30 pound weight loss since last year.  She has not had any subjective fevers.  She reported good urine output without dysuria.  ED Course:  Initial vitals showed BP 110/71, pulse 82, RR 23, temp 97.6 Fahrenheit, SPO2 98% on room air  Labs notable for potassium 3.1, magnesium 2.6, BUN 11, creatinine 0.62, WBC 7.1, hemoglobin 13.8, platelets 200,000.  Abdominal x-ray showed  sub-pathologic gaseous distention of small bowel loops and colon which may represent obstruction at the distal colon or ileus.  Patient was given IV fluids, Protonix, Zofran, and the hospitalist service was consulted to admit for further evaluation and management.   Assessment & Plan:   Principal Problem:   Sigmoid stricture (HCC) Active Problems:   Diverticulitis of sigmoid colon   COPD (chronic obstructive pulmonary disease) (HCC)  1 chronic/recurrent sigmoid diverticulitis with known abscess and distal sigmoid stricture Patient with recurrent symptoms.  Patient noted to have some intermittent abdominal pain with weight loss felt secondary to low-grade diverticular stricture.  Patient is being followed by GI who had previously recommended surgery and patient was referred to general surgery.  Patient did note to general surgery that she had some clinical improvement this morning.  Patient has been advanced to a full liquid diet per general surgery, and started on MiraLAX 17 g twice daily.  General surgery feels patient will benefit from resection given recurrence of her symptoms, weight loss.  Patient seems somewhat apprehensive per general surgery.  Continue IV fluids.  Supportive care.  Continue IV antibiotics.  General surgery and GI following and appreciate input and recommendations.  2.  Hypokalemia Likely secondary to GI losses.  Potassium at 3.0 today.  Replete.  Follow.   3.  COPD Stable.  Albuterol as needed.   DVT prophylaxis: SCDs Code Status: Full Family Communication: Updated patient.  No family at bedside. Disposition Plan: To be determined.  Discharge when okay with GI and general surgery.   Consultants:   Gastroenterology: Dr. Tarri Glenn 03/16/2019  General surgery: Dr.  White 03/16/2019  Procedures:   Acute abdominal series 03/15/2019    Antimicrobials:   IV Zosyn 03/15/2019   Subjective: Patient sitting up in bed tolerating full liquid diet which she was just  advanced to this morning.  Denies any chest pain or shortness of breath.  Feels abdominal pain improving.  Denies any bowel movements today.    Objective: Vitals:   03/16/19 1800 03/16/19 2344 03/17/19 0539 03/17/19 0700  BP:  118/60 (!) 103/58   Pulse:  (!) 57 (!) 57   Resp:  14 16   Temp:  98.9 F (37.2 C) 97.7 F (36.5 C)   TempSrc:  Oral Oral   SpO2:  100% 100%   Weight: 47 kg   50.7 kg  Height: 5\' 2"  (1.575 m)       Intake/Output Summary (Last 24 hours) at 03/17/2019 1315 Last data filed at 03/17/2019 0938 Gross per 24 hour  Intake 502.27 ml  Output 800 ml  Net -297.73 ml   Filed Weights   03/15/19 1632 03/16/19 1800 03/17/19 0700  Weight: 47 kg 47 kg 50.7 kg    Examination:  General exam: Appears calm and comfortable  Respiratory system: Lungs clear to auscultation bilaterally.  No wheezes, no crackles, no rhonchi.  Speaking in full sentences.  Normal respiratory effort.  Cardiovascular system: Regular rate and rhythm no murmurs rubs or gallops.  No JVD.  No lower extremity edema.  Gastrointestinal system: Abdomen is soft, decreased tenderness to palpation diffusely, nondistended, positive bowel sounds.  No rebound.  No guarding. Central nervous system: Alert and oriented. No focal neurological deficits. Extremities: Symmetric 5 x 5 power. Skin: No rashes, lesions or ulcers Psychiatry: Judgement and insight appear normal. Mood & affect appropriate.     Data Reviewed: I have personally reviewed following labs and imaging studies  CBC: Recent Labs  Lab 03/11/19 1906 03/15/19 1940 03/16/19 0530 03/17/19 0333  WBC 7.0 7.1 6.3 4.8  NEUTROABS  --   --   --  2.4  HGB 13.0 13.8 11.0* 10.1*  HCT 39.9 42.7 35.2* 31.7*  MCV 98.3 99.1 101.1* 99.7  PLT 219 200 168 [id number]   Basic Metabolic Panel: Recent Labs  Lab 03/11/19 1906 03/15/19 1940 03/16/19 0530 03/17/19 0333  NA 139 138 136 139  K 3.6 3.1* 4.0 3.0*  CL 102 99 105 109  CO2 25 25 21* 25  GLUCOSE 83 86 64*  130*  BUN 6* 11 9 <5*  CREATININE 0.58 0.62 0.57 0.47  CALCIUM 9.1 8.8* 7.6* 7.7*  MG  --  2.6*  --   --    GFR: Estimated Creatinine Clearance: 41.4 mL/min (by C-G formula based on SCr of 0.47 mg/dL). Liver Function Tests: Recent Labs  Lab 03/11/19 1906 03/15/19 1940  AST 21 16  ALT 7 7  ALKPHOS 51 48  BILITOT 0.4 1.1  PROT 6.7 7.0  ALBUMIN 3.2* 3.4*   No results for input(s): LIPASE, AMYLASE in the last 168 hours. No results for input(s): AMMONIA in the last 168 hours. Coagulation Profile: No results for input(s): INR, PROTIME in the last 168 hours. Cardiac Enzymes: No results for input(s): CKTOTAL, CKMB, CKMBINDEX, TROPONINI in the last 168 hours. BNP (last 3 results) No results for input(s): PROBNP in the last 8760 hours. HbA1C: No results for input(s): HGBA1C in the last 72 hours. CBG: No results for input(s): GLUCAP in the last 168 hours. Lipid Profile: No results for input(s): CHOL, HDL, LDLCALC, TRIG, CHOLHDL, LDLDIRECT in the  last 72 hours. Thyroid Function Tests: No results for input(s): TSH, T4TOTAL, FREET4, T3FREE, THYROIDAB in the last 72 hours. Anemia Panel: No results for input(s): VITAMINB12, FOLATE, FERRITIN, TIBC, IRON, RETICCTPCT in the last 72 hours. Sepsis Labs: Recent Labs  Lab 03/15/19 1940  LATICACIDVEN 1.5    Recent Results (from the past 240 hour(s))  SARS CORONAVIRUS 2 (TAT 6-24 HRS) Nasopharyngeal Nasopharyngeal Swab     Status: None   Collection Time: 03/15/19 10:22 PM   Specimen: Nasopharyngeal Swab  Result Value Ref Range Status   SARS Coronavirus 2 NEGATIVE NEGATIVE Final    Comment: (NOTE) SARS-CoV-2 target nucleic acids are NOT DETECTED. The SARS-CoV-2 RNA is generally detectable in upper and lower respiratory specimens during the acute phase of infection. Negative results do not preclude SARS-CoV-2 infection, do not rule out co-infections with other pathogens, and should not be used as the sole basis for treatment or other  patient management decisions. Negative results must be combined with clinical observations, patient history, and epidemiological information. The expected result is Negative. Fact Sheet for Patients: SugarRoll.be Fact Sheet for Healthcare Providers: https://www.woods-mathews.com/ This test is not yet approved or cleared by the [place] FDA and  has been authorized for detection and/or diagnosis of SARS-CoV-2 by FDA under an Emergency Use Authorization (EUA). This EUA will remain  in effect (meaning this test can be used) for the duration of the COVID-19 declaration under Section 56 4(b)(1) of the Act, 21 U.S.C. section 360bbb-3(b)(1), unless the authorization is terminated or revoked sooner. Performed at [facility], [place] [address] [place], [place] [address]          Radiology Studies: Dg Abd Acute W/chest  Result Date: 03/15/2019 CLINICAL DATA:  Left lower quadrant pain. EXAM: DG ABDOMEN ACUTE W/ 1V CHEST COMPARISON:  Abdominal CT March 11, 2019 FINDINGS: Sub pathologic gaseous distension of small bowel loops and colon which may represent a low-grade/intermediate obstruction at the level of the distal colon, or ileus. No evidence of free gas in the abdomen seen radiographically. IMPRESSION: Sub pathologic gaseous distension of small bowel loops and colon which may represent a low-grade/intermediate obstruction at the level of the distal colon, or ileus. Electronically Signed   By: Fidela Salisbury M.D.   On: 03/15/2019 18:37        Scheduled Meds: . polyethylene glycol  17 g Oral BID  . potassium chloride  40 mEq Oral Q4H   Continuous Infusions: . dextrose 5 % and 0.9% NaCl 75 mL/hr at 03/17/19 1032  . piperacillin-tazobactam (ZOSYN)  IV 3.375 g (03/17/19 0755)     LOS: 2 days    Time spent: 35 minutes    Theresa Seal, MD [facility]  If 7PM-7AM, please contact night-coverage [email or web address]  03/17/2019, 1:15 PM

## 2019-03-17 NOTE — Telephone Encounter (Signed)
Called back to follow up since this RN did not receive a return phone call which is unusual. The patient husband informed this RN that the patient has been admitted to [facility] hospital.

## 2019-03-17 NOTE — Evaluation (Signed)
Occupational Therapy Evaluation Patient Details Name: Theresa Arnold MRN: [id number] DOB: September 06, 1931 Today's Date: 03/17/2019    History of Present Illness 85yo female who presented to the ED with abdominal pain. Admitted for chronic/recurrent sigmoid diverticulitis with known abscess and distal sigmoid stricture. PMH cervical radiculopathy and spondylosis, COPD, chronic diverticulitis with abscess   Clinical Impression   Pt admitted with the above diagnoses and presents with below problem list. Pt will benefit from continued acute OT to address the below listed deficits and maximize independence with basic ADLs prior to d/c home. PTA pt was independent with ADLs and mobility. Pt is currently min guard to light min A for LB ADLs, supervision for functional transfers and mobility. Began education on AE and strategies for LB ADLs as pt reports increased pain with trunk flexion (leaning forward).      Follow Up Recommendations  No OT follow up;Supervision - Intermittent    Equipment Recommendations  None recommended by OT    Recommendations for Other Services       Precautions / Restrictions Precautions Precautions: Fall Restrictions Weight Bearing Restrictions: No      Mobility Bed Mobility Overal bed mobility: Independent             General bed mobility comments: OOB in chair  Transfers Overall transfer level: Needs assistance Equipment used: None Transfers: Sit to/from Stand Sit to Stand: Supervision         General transfer comment: S for safety, no physical assist given    Balance Overall balance assessment: Needs assistance Sitting-balance support: Feet supported;No upper extremity supported Sitting balance-Leahy Scale: Normal     Standing balance support: Bilateral upper extremity supported;During functional activity Standing balance-Leahy Scale: Fair Standing balance comment: intermittent single extremity support especially for dynamic tasks                            ADL either performed or assessed with clinical judgement   ADL Overall ADL's : Needs assistance/impaired Eating/Feeding: Set up;Sitting   Grooming: Set up;Supervision/safety;Standing;Sitting   Upper Body Bathing: Set up;Sitting   Lower Body Bathing: Supervison/ safety;Sit to/from stand;With adaptive equipment   Upper Body Dressing : Set up;Sitting   Lower Body Dressing: Supervision/safety;Sit to/from stand   Toilet Transfer: Supervision/safety   Toileting- Water quality scientist and Hygiene: Supervision/safety;Sit to/from stand   Tub/ Shower Transfer: Supervision/safety;Ambulation   Functional mobility during ADLs: Supervision/safety General ADL Comments: Discussed AE and strategies for LB ADLs with abdominal pain.     Vision         Perception     Praxis      Pertinent Vitals/Pain Pain Assessment: Faces Pain Score: 5  Faces Pain Scale: Hurts little more Pain Location: abdomen Pain Descriptors / Indicators: Discomfort Pain Intervention(s): Monitored during session     Hand Dominance     Extremity/Trunk Assessment Upper Extremity Assessment Upper Extremity Assessment: Overall WFL for tasks assessed;Generalized weakness   Lower Extremity Assessment Lower Extremity Assessment: Defer to PT evaluation   Cervical / Trunk Assessment Cervical / Trunk Assessment: Normal   Communication Communication Communication: No difficulties   Cognition Arousal/Alertness: Awake/alert Behavior During Therapy: WFL for tasks assessed/performed Overall Cognitive Status: Within Functional Limits for tasks assessed                                     General Comments  Exercises     Shoulder Instructions      Home Living Family/patient expects to be discharged to:: Private residence Living Arrangements: Spouse/significant other Available Help at Discharge: Family Type of Home: House Home Access: Level entry     Home  Layout: Able to live on main level with bedroom/bathroom;Multi-level Alternate Level Stairs-Number of Steps: flight   Bathroom Shower/Tub: Teacher, early years/pre: Standard Bathroom Accessibility: No   Home Equipment: Shower seat - built in          Prior Functioning/Environment Level of Independence: Independent        Comments: very independent at baseline, able to do everything she needed and wanted in community        OT Problem List: Impaired balance (sitting and/or standing);Decreased knowledge of use of DME or AE;Decreased knowledge of precautions;Pain      OT Treatment/Interventions: Self-care/ADL training;Energy conservation;DME and/or AE instruction;Therapeutic activities;Patient/family education;Balance training    OT Goals(Current goals can be found in the care plan section) Acute Rehab OT Goals Patient Stated Goal: go home, have less pain; maintain independence OT Goal Formulation: With patient Time For Goal Achievement: 03/24/19 Potential to Achieve Goals: Good ADL Goals Pt Will Perform Lower Body Bathing: with modified independence;sit to/from stand Pt Will Perform Lower Body Dressing: with modified independence;sit to/from stand Pt Will Perform Tub/Shower Transfer: Shower transfer;with modified independence;shower seat  OT Frequency: Min 2X/week   Barriers to D/C:            Co-evaluation              AM-PAC OT "6 Clicks" Daily Activity     Outcome Measure Help from another person eating meals?: None Help from another person taking care of personal grooming?: None Help from another person toileting, which includes using toliet, bedpan, or urinal?: A Little Help from another person bathing (including washing, rinsing, drying)?: A Little Help from another person to put on and taking off regular upper body clothing?: None Help from another person to put on and taking off regular lower body clothing?: A Little 6 Click Score: 21   End  of Session    Activity Tolerance: Patient tolerated treatment well Patient left: Other (comment);with call bell/phone within reach(on toilet, requesting privacy and time)  OT Visit Diagnosis: Unsteadiness on feet (R26.81);Pain                Time: [id number] OT Time Calculation (min): 9 min Charges:  OT General Charges $OT Visit: 1 Visit OT Evaluation $OT Eval Low Complexity: [place], OT Acute Rehabilitation Services Pager: [phone] Office: [phone]   Hortencia Pilar 03/17/2019, 1:57 PM

## 2019-03-17 NOTE — Care Management Important Message (Signed)
Important Message  Patient Details IM Letter given to Marney Doctor RN to present to the Patient Name: Theresa Arnold MRN: [id number] Date of Birth: Sep 29, 1931   Medicare Important Message Given:  Yes     Kerin Salen 03/17/2019, 10:49 AM

## 2019-03-17 NOTE — Evaluation (Signed)
Physical Therapy Evaluation Patient Details Name: PAISELY BRICK MRN: [id number] DOB: Apr 28, 1932 Today's Date: 03/17/2019   History of Present Illness  85yo female who presented to the ED with abdominal pain. Admitted for chronic/recurrent sigmoid diverticulitis with known abscess and distal sigmoid stricture. PMH cervical radiculopathy and spondylosis, COPD, chronic diverticulitis with abscess  Clinical Impression   Patient received up in chair, very pleasant and willing to participate in therapy today. Able to complete functional transfers with S, however did require B UE support (IV pole on one side, HHA from PT on the other) to maintain balance during gait in hallway due to general unsteadiness today. Tolerated gait training approximately 145f, then able to toilet and perform pericare with distant S. She was left up in the chair with all needs met and questions/concerns addressed this morning. She will continue to benefit from skilled PT services in the acute setting, also recommend RW and skilled OP PT services following discharge from this facility.     Follow Up Recommendations Outpatient PT    Equipment Recommendations  Rolling walker with 5" wheels    Recommendations for Other Services       Precautions / Restrictions Precautions Precautions: Fall Restrictions Weight Bearing Restrictions: No      Mobility  Bed Mobility               General bed mobility comments: OOB in chair  Transfers Overall transfer level: Needs assistance Equipment used: None Transfers: Sit to/from Stand Sit to Stand: Supervision         General transfer comment: S for safety, no physical assist given  Ambulation/Gait Ambulation/Gait assistance: Min assist Gait Distance (Feet): 120 Feet Assistive device: IV Pole;1 person hand held assist Gait Pattern/deviations: Step-through pattern;Drifts right/left;Narrow base of support Gait velocity: decreased   General Gait Details: patient  needing to hold onto IV pole as well as HHA from PT to maintain balance today, gait distance limited by stomach pain  Stairs            Wheelchair Mobility    Modified Rankin (Stroke Patients Only)       Balance Overall balance assessment: Needs assistance Sitting-balance support: Feet supported;No upper extremity supported Sitting balance-Leahy Scale: Normal     Standing balance support: Bilateral upper extremity supported;During functional activity Standing balance-Leahy Scale: Fair Standing balance comment: requires B UE support to maintain balance                             Pertinent Vitals/Pain Pain Assessment: 0-10 Pain Score: 5  Pain Location: abdomen Pain Descriptors / Indicators: Discomfort Pain Intervention(s): Limited activity within patient's tolerance;Monitored during session    Home Living Family/patient expects to be discharged to:: Private residence Living Arrangements: Spouse/significant other Available Help at Discharge: Family Type of Home: House Home Access: Level entry     Home Layout: Able to live on main level with bedroom/bathroom;Multi-level Home Equipment: None      Prior Function Level of Independence: Independent         Comments: very independent at baseline, able to do everything she needed and wanted in community     Hand Dominance        Extremity/Trunk Assessment   Upper Extremity Assessment Upper Extremity Assessment: Overall WFL for tasks assessed    Lower Extremity Assessment Lower Extremity Assessment: Generalized weakness    Cervical / Trunk Assessment Cervical / Trunk Assessment: Normal  Communication   Communication:  No difficulties  Cognition Arousal/Alertness: Awake/alert Behavior During Therapy: WFL for tasks assessed/performed Overall Cognitive Status: Within Functional Limits for tasks assessed                                        General Comments       Exercises     Assessment/Plan    PT Assessment Patient needs continued PT services  PT Problem List Decreased strength;Decreased coordination;Decreased balance       PT Treatment Interventions DME instruction;Therapeutic activities;Gait training;Therapeutic exercise;Patient/family education;Stair training;Balance training;Functional mobility training;Neuromuscular re-education    PT Goals (Current goals can be found in the Care Plan section)  Acute Rehab PT Goals Patient Stated Goal: go home, have less pain PT Goal Formulation: With patient Time For Goal Achievement: 03/31/19 Potential to Achieve Goals: Good    Frequency Min 3X/week   Barriers to discharge        Co-evaluation               AM-PAC PT "6 Clicks" Mobility  Outcome Measure Help needed turning from your back to your side while in a flat bed without using bedrails?: A Little Help needed moving from lying on your back to sitting on the side of a flat bed without using bedrails?: A Little Help needed moving to and from a bed to a chair (including a wheelchair)?: A Little Help needed standing up from a chair using your arms (e.g., wheelchair or bedside chair)?: A Little Help needed to walk in hospital room?: A Little Help needed climbing 3-5 steps with a railing? : A Little 6 Click Score: 18    End of Session   Activity Tolerance: Patient tolerated treatment well;Patient limited by pain Patient left: in chair;with call bell/phone within reach   PT Visit Diagnosis: Unsteadiness on feet (R26.81);Muscle weakness (generalized) (M62.81)    Time: 8466-5993 PT Time Calculation (min) (ACUTE ONLY): 21 min   Charges:   PT Evaluation $PT Eval Low Complexity: 1 Low          [name] PT, DPT, CBIS  Supplemental Physical Therapist [place]    Pager [phone] Acute Rehab Office [phone]

## 2019-03-18 ENCOUNTER — Inpatient Hospital Stay (HOSPITAL_COMMUNITY): Payer: Medicare Other

## 2019-03-18 DIAGNOSIS — K635 Polyp of colon: Secondary | ICD-10-CM | POA: Diagnosis present

## 2019-03-18 DIAGNOSIS — K56609 Unspecified intestinal obstruction, unspecified as to partial versus complete obstruction: Secondary | ICD-10-CM | POA: Diagnosis present

## 2019-03-18 DIAGNOSIS — B370 Candidal stomatitis: Secondary | ICD-10-CM | POA: Diagnosis present

## 2019-03-18 LAB — BASIC METABOLIC PANEL
Anion gap: 5 (ref 5–15)
Anion gap: 11 (ref 5–15)
BUN: <5 mg/dL — ABNORMAL LOW (ref 8–23)
BUN: <5 mg/dL — ABNORMAL LOW (ref 8–23)
CO2: 25 mmol/L (ref 22–32)
CO2: 24 mmol/L (ref 22–32)
Calcium: 7.7 mg/dL — ABNORMAL LOW (ref 8.9–10.3)
Calcium: 8 mg/dL — ABNORMAL LOW (ref 8.9–10.3)
Chloride: 109 mmol/L (ref 98–111)
Chloride: 105 mmol/L (ref 98–111)
Creatinine, Ser: 0.42 mg/dL — ABNORMAL LOW (ref 0.44–1.00)
Creatinine, Ser: 0.49 mg/dL (ref 0.44–1.00)
GFR calc Af Amer: >60 mL/min (ref >60)
GFR calc Af Amer: >60 mL/min (ref >60)
GFR calc non Af Amer: >60 mL/min (ref >60)
GFR calc non Af Amer: >60 mL/min (ref >60)
Glucose, Bld: 107 mg/dL — ABNORMAL HIGH (ref 70–99)
Glucose, Bld: 83 mg/dL (ref 70–99)
Potassium: 2.6 mmol/L — CL (ref 3.5–5.1)
Potassium: 2.9 mmol/L — ABNORMAL LOW (ref 3.5–5.1)
Sodium: 139 mmol/L (ref 135–145)
Sodium: 140 mmol/L (ref 135–145)

## 2019-03-18 LAB — MAGNESIUM: Magnesium: 1.8 mg/dL (ref 1.7–2.4)

## 2019-03-18 LAB — CBC
HCT: 31.6 % — ABNORMAL LOW (ref 36.0–46.0)
Hemoglobin: 10.1 g/dL — ABNORMAL LOW (ref 12.0–15.0)
MCH: 31.7 pg (ref 26.0–34.0)
MCHC: 32 g/dL (ref 30.0–36.0)
MCV: 99.1 fL (ref 80.0–100.0)
Platelets: 168 10*3/uL (ref 150–400)
RBC: 3.19 MIL/uL — ABNORMAL LOW (ref 3.87–5.11)
RDW: 14.1 % (ref 11.5–15.5)
WBC: 4.6 10*3/uL (ref 4.0–10.5)
nRBC: 0 % (ref 0.0–0.2)

## 2019-03-18 LAB — PREALBUMIN: Prealbumin: 6.5 mg/dL — ABNORMAL LOW (ref 18–38)

## 2019-03-18 MED ORDER — SIMETHICONE 40 MG/0.6ML PO SUSP
40.0000 mg | Freq: Four times a day (QID) | ORAL | Status: DC | PRN
  Filled 2019-03-18: qty 0.6

## 2019-03-18 MED ORDER — FLUCONAZOLE 100MG IVPB
100.0000 mg | INTRAVENOUS | Status: AC
  Administered 2019-03-18 – 2019-03-24 (×7): 100 mg via INTRAVENOUS
  Filled 2019-03-18 (×7): qty 50

## 2019-03-18 MED ORDER — SODIUM CHLORIDE 0.9% FLUSH: 3.0000 mL | INTRAVENOUS | Status: DC | PRN

## 2019-03-18 MED ORDER — ALUM & MAG HYDROXIDE-SIMETH 200-200-20 MG/5ML PO SUSP: 30.0000 mL | Freq: Four times a day (QID) | ORAL | Status: DC | PRN

## 2019-03-18 MED ORDER — TIOTROPIUM BROMIDE MONOHYDRATE 18 MCG IN CAPS: 18.0000 ug | ORAL_CAPSULE | Freq: Every day | RESPIRATORY_TRACT | Status: DC

## 2019-03-18 MED ORDER — BISACODYL 10 MG RE SUPP
10.0000 mg | Freq: Every day | RECTAL | Status: DC
  Administered 2019-03-20 – 2019-03-21 (×2): 10 mg via RECTAL
  Filled 2019-03-18 (×2): qty 1

## 2019-03-18 MED ORDER — HYDROCORTISONE 1 % EX CREA: 1.0000 "application " | TOPICAL_CREAM | Freq: Three times a day (TID) | CUTANEOUS | Status: DC | PRN

## 2019-03-18 MED ORDER — LACTATED RINGERS IV BOLUS: 1000.0000 mL | Freq: Three times a day (TID) | INTRAVENOUS | Status: AC | PRN

## 2019-03-18 MED ORDER — POTASSIUM CHLORIDE CRYS ER 20 MEQ PO TBCR
40.0000 meq | EXTENDED_RELEASE_TABLET | ORAL | Status: AC
  Administered 2019-03-18 (×2): 40 meq via ORAL
  Filled 2019-03-18 (×2): qty 2

## 2019-03-18 MED ORDER — MOMETASONE FURO-FORMOTEROL FUM 100-5 MCG/ACT IN AERO
2.0000 | INHALATION_SPRAY | Freq: Two times a day (BID) | RESPIRATORY_TRACT | Status: DC
  Administered 2019-03-22 – 2019-04-01 (×21): 2 via RESPIRATORY_TRACT
  Filled 2019-03-18: qty 8.8

## 2019-03-18 MED ORDER — IPRATROPIUM-ALBUTEROL 0.5-2.5 (3) MG/3ML IN SOLN: 3.0000 mL | RESPIRATORY_TRACT | Status: DC | PRN

## 2019-03-18 MED ORDER — MAGIC MOUTHWASH
15.0000 mL | Freq: Four times a day (QID) | ORAL | Status: DC | PRN
  Filled 2019-03-18: qty 15

## 2019-03-18 MED ORDER — PHENOL 1.4 % MT LIQD: 1.0000 | OROMUCOSAL | Status: DC | PRN

## 2019-03-18 MED ORDER — UMECLIDINIUM BROMIDE 62.5 MCG/INH IN AEPB
1.0000 | INHALATION_SPRAY | Freq: Every day | RESPIRATORY_TRACT | Status: DC
  Filled 2019-03-18: qty 7

## 2019-03-18 MED ORDER — LIP MEDEX EX OINT
1.0000 "application " | TOPICAL_OINTMENT | Freq: Two times a day (BID) | CUTANEOUS | Status: DC
  Administered 2019-03-18 – 2019-04-02 (×26): 1 via TOPICAL
  Filled 2019-03-18 (×4): qty 7

## 2019-03-18 MED ORDER — GUAIFENESIN-DM 100-10 MG/5ML PO SYRP: 10.0000 mL | ORAL_SOLUTION | ORAL | Status: DC | PRN

## 2019-03-18 MED ORDER — SODIUM CHLORIDE 0.9% FLUSH
3.0000 mL | Freq: Two times a day (BID) | INTRAVENOUS | Status: DC
  Administered 2019-03-18 – 2019-03-30 (×4): 3 mL via INTRAVENOUS

## 2019-03-18 MED ORDER — MENTHOL 3 MG MT LOZG: 1.0000 | LOZENGE | OROMUCOSAL | Status: DC | PRN

## 2019-03-18 MED ORDER — SODIUM CHLORIDE 0.9 % IV SOLN
250.0000 mL | INTRAVENOUS | Status: DC | PRN
  Administered 2019-03-18 – 2019-03-31 (×3): 250 mL via INTRAVENOUS

## 2019-03-18 MED ORDER — NYSTATIN 100000 UNIT/ML MT SUSP
5.0000 mL | Freq: Four times a day (QID) | OROMUCOSAL | Status: DC
  Administered 2019-03-18 – 2019-03-31 (×44): 500000 [IU] via ORAL
  Filled 2019-03-18 (×46): qty 5

## 2019-03-18 MED ORDER — HYDROCORTISONE (PERIANAL) 2.5 % EX CREA: 1.0000 "application " | TOPICAL_CREAM | Freq: Four times a day (QID) | CUTANEOUS | Status: DC | PRN

## 2019-03-18 MED ORDER — UMECLIDINIUM BROMIDE 62.5 MCG/INH IN AEPB
1.0000 | INHALATION_SPRAY | Freq: Every day | RESPIRATORY_TRACT | Status: DC
  Administered 2019-03-23 – 2019-04-01 (×9): 1 via RESPIRATORY_TRACT
  Filled 2019-03-18 (×2): qty 7

## 2019-03-18 NOTE — Progress Notes (Signed)
rm [id number] Theresa Arnold, 0000000.  Potassium is 2.6 this morning.

## 2019-03-18 NOTE — Progress Notes (Signed)
Theresa Arnold [id number] January 01, 1932  CARE TEAM:  PCP: Biagio Borg, MD  Outpatient Care Team: Patient Care Team: Biagio Borg, MD as PCP - General  Inpatient Treatment Team: Treatment Team: Attending Provider: Eugenie Filler, MD; Rounding Team: Redmond Baseman, MD; Consulting Physician: Thornton Park, MD; Lactation Consultant: Edison Pace, Md, MD; Technician: Berenice Bouton, NT; Registered Nurse: Norlene Duel, RN   Problem List:   Principal Problem:   Sigmoid stricture [facility]) Active Problems:   COPD (chronic obstructive pulmonary disease) ([place])   Diverticulitis of sigmoid colon   Abnormal CT scan, gastrointestinal tract   Colonic partial obstruction from sigmoid stricture   Polyp of sigmoid colon      * No surgery found *      Assessment  Partial colonic obstruction from sigmoid stricture.  Presumably due to chronic recurrent diverticulitis  [facility] Stay = 3 days)  Plan:  -I think it is a question of not if the patient needs a colectomy but when.  The patient seems much more open to it now.  I tried to frame it and that she may require 2 surgeries.  One urgent with colostomy and then ostomy takedown the future.  If she can become less distended and keep her nutrition up, perhaps we can do just a 1 stage operation.  Because she is feeling less distended and x-rays are better on MiraLAX and liquids, we will see if she can tolerate a liquid diet and plan sigmoid colectomy in the near future electively.  I am skeptical that she will not fly and will need surgery this admission.  Risks of need for conversion to open or ostomy higher.  While she does not like the idea of an ostomy, she understands that failure to improve is a sign that she is becoming fully obstructed and risk of bowel ischemia/perforation/aspiration = death very high.  After long discussion with Dr. Dema Severin yesterday as well as with me this morning, she is much more open to the idea of surgery.  She is  discussed with her family & they agree.  Patient apparently has a polyp in the sigmoid colon as well that gastroenterology would like [place] to remove en bloc.  That should not be an issue.  Would like medical clearance from primary service to see what they feel her operative risks are.?  Does she need an echo.  Seems relatively healthy for her advanced age but does have a history of COPD  If she cannot consistently tolerate liquids, plan surgery soon  -VTE prophylaxis- SCDs, etc -mobilize as tolerated to help recovery 30 minutes spent in review, evaluation, examination, counseling, and coordination of care.  More than 50% of that time was spent in counseling.  03/18/2019    Subjective: (Chief complaint)  Patient claims she finally had some bowel movements and pass gas.  Feels better.  Could not tolerate full liquids last night.  Prefers that over clear liquids though.  Objective:  Vital signs:  Vitals:   03/17/19 0700 03/17/19 1334 03/17/19 2128 03/18/19 0456  BP:  (!) 98/55 123/73 127/65  Pulse:  74 65 (!) 58  Resp:  17 16 16   Temp:   99.5 F (37.5 C) 97.8 F (36.6 C)  TempSrc:   Oral Oral  SpO2:  100% 100%   Weight: 50.7 kg   52 kg  Height:        Last BM Date: 03/18/19  Intake/Output   Yesterday:  09/04 0701 - 09/05 0700 In:  2221.9 [P.O.:610; I.V.:1430; IV Piggyback:181.9] Out: 276 [Urine:275; Stool:1] This shift:  No intake/output data recorded.  Bowel function:  Flatus: YES  BM:  YES  Drain: (No drain)   Physical Exam:  General: Pt awake/alert/oriented x4 in no acute distress Eyes: PERRL, normal EOM.  Sclera clear.  No icterus Neuro: CN II-XII intact w/o focal sensory/motor deficits. Lymph: No head/neck/groin lymphadenopathy Psych:  No delerium/psychosis/paranoia HENT: Normocephalic, Mucus membranes moist.  No thrush Neck: Supple, No tracheal deviation Chest: No chest wall pain w good excursion CV:  Pulses intact.  Regular rhythm MS: Normal AROM mjr  joints.  No obvious deformity  Abdomen: Soft.  Moderately distended.  Nontender.  No evidence of peritonitis.  No incarcerated hernias.  Ext:   No deformity.  No mjr edema.  No cyanosis Skin: No petechiae / purpura  Results:   Cultures: Recent Results (from the past 720 hour(s))  SARS CORONAVIRUS 2 (TAT 6-24 HRS) Nasopharyngeal Nasopharyngeal Swab     Status: None   Collection Time: 03/15/19 10:22 PM   Specimen: Nasopharyngeal Swab  Result Value Ref Range Status   SARS Coronavirus 2 NEGATIVE NEGATIVE Final    Comment: (NOTE) SARS-CoV-2 target nucleic acids are NOT DETECTED. The SARS-CoV-2 RNA is generally detectable in upper and lower respiratory specimens during the acute phase of infection. Negative results do not preclude SARS-CoV-2 infection, do not rule out co-infections with other pathogens, and should not be used as the sole basis for treatment or other patient management decisions. Negative results must be combined with clinical observations, patient history, and epidemiological information. The expected result is Negative. Fact Sheet for Patients: SugarRoll.be Fact Sheet for Healthcare Providers: https://www.woods-mathews.com/ This test is not yet approved or cleared by the [place] FDA and  has been authorized for detection and/or diagnosis of SARS-CoV-2 by FDA under an Emergency Use Authorization (EUA). This EUA will remain  in effect (meaning this test can be used) for the duration of the COVID-19 declaration under Section 56 4(b)(1) of the Act, 21 U.S.C. section 360bbb-3(b)(1), unless the authorization is terminated or revoked sooner. Performed at [facility], [place] [address], [place], [place] [address]     Labs: Results for orders placed or performed during the hospital encounter of 03/15/19 (from the past 48 hour(s))  CBC with Differential/Platelet     Status: Abnormal   Collection Time: 03/17/19  3:33 AM   Result Value Ref Range   WBC 4.8 4.0 - 10.5 K/uL   RBC 3.18 (L) 3.87 - 5.11 MIL/uL   Hemoglobin 10.1 (L) 12.0 - 15.0 g/dL   HCT 31.7 (L) 36.0 - 46.0 %   MCV 99.7 80.0 - 100.0 fL   MCH 31.8 26.0 - 34.0 pg   MCHC 31.9 30.0 - 36.0 g/dL   RDW 14.1 11.5 - 15.5 %   Platelets 156 150 - 400 K/uL   nRBC 0.0 0.0 - 0.2 %   Neutrophils Relative % 50 %   Neutro Abs 2.4 1.7 - 7.7 K/uL   Lymphocytes Relative 38 %   Lymphs Abs 1.8 0.7 - 4.0 K/uL   Monocytes Relative 9 %   Monocytes Absolute 0.4 0.1 - 1.0 K/uL   Eosinophils Relative 3 %   Eosinophils Absolute 0.1 0.0 - 0.5 K/uL   Basophils Relative 0 %   Basophils Absolute 0.0 0.0 - 0.1 K/uL   Immature Granulocytes 0 %   Abs Immature Granulocytes 0.01 0.00 - 0.07 K/uL    Comment: Performed at [facility], [place] [address] [place], [place] [id number]  Basic metabolic panel     Status: Abnormal   Collection Time: 03/17/19  3:33 AM  Result Value Ref Range   Sodium 139 135 - 145 mmol/L   Potassium 3.0 (L) 3.5 - 5.1 mmol/L    Comment: DELTA CHECK NOTED   Chloride 109 98 - 111 mmol/L   CO2 25 22 - 32 mmol/L   Glucose, Bld 130 (H) 70 - 99 mg/dL   BUN <5 (L) 8 - 23 mg/dL   Creatinine, Ser 0.47 0.44 - 1.00 mg/dL   Calcium 7.7 (L) 8.9 - 10.3 mg/dL   GFR calc non Af Amer >60 >60 mL/min   GFR calc Af Amer >60 >60 mL/min   Anion gap 5 5 - 15    Comment: Performed at [facility], [place] [address] [place], [place] [address]  Glucose, capillary     Status: Abnormal   Collection Time: 03/17/19  4:13 PM  Result Value Ref Range   Glucose-Capillary 100 (H) 70 - 99 mg/dL  CBC     Status: Abnormal   Collection Time: 03/18/19  3:12 AM  Result Value Ref Range   WBC 4.6 4.0 - 10.5 K/uL   RBC 3.19 (L) 3.87 - 5.11 MIL/uL   Hemoglobin 10.1 (L) 12.0 - 15.0 g/dL   HCT 31.6 (L) 36.0 - 46.0 %   MCV 99.1 80.0 - 100.0 fL   MCH 31.7 26.0 - 34.0 pg   MCHC 32.0 30.0 - 36.0 g/dL   RDW 14.1 11.5 - 15.5 %   Platelets 168 150 - 400  K/uL   nRBC 0.0 0.0 - 0.2 %    Comment: Performed at [facility], [place] [address] [place], [place] [id number]  Basic metabolic panel     Status: Abnormal   Collection Time: 03/18/19  3:12 AM  Result Value Ref Range   Sodium 139 135 - 145 mmol/L   Potassium 2.6 (LL) 3.5 - 5.1 mmol/L    Comment: CRITICAL RESULT CALLED TO, READ BACK BY AND VERIFIED WITH: RIGHT,S RN @0350  ON 03/18/2019 JACKSON,K    Chloride 109 98 - 111 mmol/L   CO2 25 22 - 32 mmol/L   Glucose, Bld 107 (H) 70 - 99 mg/dL   BUN <5 (L) 8 - 23 mg/dL   Creatinine, Ser 0.42 (L) 0.44 - 1.00 mg/dL   Calcium 7.7 (L) 8.9 - 10.3 mg/dL   GFR calc non Af Amer >60 >60 mL/min   GFR calc Af Amer >60 >60 mL/min   Anion gap 5 5 - 15    Comment: Performed at [facility], [place] [address], [place], [place] [address]  Magnesium     Status: None   Collection Time: 03/18/19  3:12 AM  Result Value Ref Range   Magnesium 1.8 1.7 - 2.4 mg/dL    Comment: Performed at [facility], [place] [address] [place],  [address]    Imaging / Studies: Dg Abd 1 View  Result Date: 03/18/2019 CLINICAL DATA:  History of sigmoid diverticulitis with associated small abscess by CT scan 03/11/2019. Possible colon stricture. EXAM: ABDOMEN - 1 VIEW COMPARISON:  Chest and two views abdomen 03/15/2019. CT abdomen and pelvis 03/11/2019. FINDINGS: No evidence of free intraperitoneal air is seen on supine films. There is mild gaseous distention of the colon and scattered small bowel loops, improved since the comparison plain films. There is some gas in the rectum. No abnormal  abdominal calcification. No acute bony abnormality. IMPRESSION: Mild gaseous distention of the colon appears improved compared to the most recent exam. Electronically Signed   By: Inge Rise M.D.   On: 03/18/2019 08:27    Medications / Allergies: per chart  Antibiotics: Anti-infectives (From admission, onward)   Start     Dose/Rate  Route Frequency Ordered Stop   03/16/19 0600  piperacillin-tazobactam (ZOSYN) IVPB 3.375 g     3.375 g 12.5 mL/hr over 240 Minutes Intravenous Every 8 hours 03/15/19 2316     03/15/19 2315  piperacillin-tazobactam (ZOSYN) IVPB 3.375 g     3.375 g 100 mL/hr over 30 Minutes Intravenous NOW 03/15/19 2314 03/16/19 0038        Note: Portions of this report may have been transcribed using voice recognition software. Every effort was made to ensure accuracy; however, inadvertent computerized transcription errors may be present.   Any transcriptional errors that result from this process are unintentional.     [name], MD, FACS, MASCRS Gastrointestinal and Minimally Invasive Surgery    [address], [place], [place] [address] [phone] Main / Paging [phone] Fax

## 2019-03-18 NOTE — Progress Notes (Signed)
PROGRESS NOTE    Theresa Arnold  [id number] DOB: July 26, 1931 DOA: 03/15/2019 PCP: Biagio Borg, MD    Brief Narrative:  Per Dr. Dondra Prader Theresa Arnold is a 85 y.o. female with medical history significant for COPD, hyperlipidemia, and chronic sigmoid diverticulitis with known abscess and stricture in the distal sigmoid colon who presented to the ED for evaluation of abdominal pain.  Patient follows with GI, Dr. Tarri Glenn, for known sigmoid diverticulitis and abscess.  Per GI documentation, case was discussed with IR and fluid collection was not considered amenable to IR drainage.  She underwent colonoscopy 01/27/2019 which was limited due to presence of stool.  Distal sigmoid stricture was seen.  She was reportedly referred to general surgery, [facility] Dr. Dema Severin, and has no present plans for surgical intervention.  Patient was seen in the ED on 03/11/2019 at which time a repeat CT abdomen/pelvis with contrast showed an apparent increase in proximal sigmoid colon wall thickening and unchanged 3.5 cm intramural sigmoid colonic wall abscess/collection which was present since 11/02/2018.  There was question of low-grade obstruction. She was given a prescription for ciprofloxacin and metronidazole.  Patient stated that she has had continued generalized abdominal pain.  She has not had oral intake due to the nausea after eating.  She has been unable to take her home medications including the recently prescribed antibiotics.  She stated that she has not had any bowel movements despite use of MiraLAX for several days.  She reported a 30 pound weight loss since last year.  She has not had any subjective fevers.  She reported good urine output without dysuria.  ED Course:  Initial vitals showed BP 110/71, pulse 82, RR 23, temp 97.6 Fahrenheit, SPO2 98% on room air  Labs notable for potassium 3.1, magnesium 2.6, BUN 11, creatinine 0.62, WBC 7.1, hemoglobin 13.8, platelets 200,000.  Abdominal x-ray showed  sub-pathologic gaseous distention of small bowel loops and colon which may represent obstruction at the distal colon or ileus.  Patient was given IV fluids, Protonix, Zofran, and the hospitalist service was consulted to admit for further evaluation and management.   Assessment & Plan:   Principal Problem:   Sigmoid stricture (HCC) Active Problems:   Diverticulitis of sigmoid colon   COPD (chronic obstructive pulmonary disease) ([facility])   Abnormal CT scan, gastrointestinal tract   Colonic partial obstruction from sigmoid stricture   Polyp of sigmoid colon  1 chronic/recurrent sigmoid diverticulitis with known abscess and distal sigmoid stricture Patient with recurrent symptoms.  Patient noted to have some intermittent abdominal pain with weight loss felt secondary to low-grade diverticular stricture.  Patient is being followed by GI who had previously recommended surgery and patient was referred to general surgery.  Patient has been advanced to a full liquid diet per general surgery on 03/17/2019 however patient unable to tolerate full liquid diet with some abdominal discomfort.  Patient on MiraLAX 17 g twice daily. General surgery feels patient will benefit from resection given recurrence of her symptoms, weight loss.  Patient seems somewhat apprehensive per general surgery early on in the hospitalization.  Patient's diet has been downgraded to clear liquids per general surgery who are recommending sigmoid colectomy in the near future electively.  Per general surgery if no significant improvement may likely need surgery during this hospitalization.  Patient denies any recent chest pain or shortness of breath.  Patient denies any history of MI.  Patient able to tolerate 4 METS.  Will get a EKG.  Chest x-ray  done with no acute infiltrate.  Patient with sats of 100% on room air.  Will place on Dulera and Spiriva to optimize pulmonary function as patient with history of COPD.  Duo nebs as needed.  Patient  with history of COPD however not O2 dependent.  Per Lyndel Safe perioperative cardiac risk patient is an estimated risk of 0.32% for perioperative MI..  Continue IV fluids.  Supportive care.  Continue IV antibiotics.  General surgery and GI following and appreciate input and recommendations.  2.  Hypokalemia Likely secondary to GI losses.  Potassium at 2.9 today.  Replete.  Follow.   3.  COPD Stable.  Albuterol as needed.   DVT prophylaxis: SCDs Code Status: Full Family Communication: Updated patient.  No family at bedside. Disposition Plan Discharge when okay with GI and general surgery.   Consultants:   Gastroenterology: Dr. Tarri Glenn 03/16/2019  General surgery: Dr. Dema Severin 03/16/2019  Procedures:   Acute abdominal series 03/15/2019    Antimicrobials:   IV Zosyn 03/15/2019   Subjective: Patient sitting up in bed.  Patient stated some abdominal pain and discomfort with full liquid diet yesterday.  Patient stated positive flatus.  Patient stated had some bowel movements.   Objective: Vitals:   03/17/19 0700 03/17/19 1334 03/17/19 2128 03/18/19 0456  BP:  (!) 98/55 123/73 127/65  Pulse:  74 65 (!) 58  Resp:  17 16 16   Temp:   99.5 F (37.5 C) 97.8 F (36.6 C)  TempSrc:   Oral Oral  SpO2:  100% 100%   Weight: 50.7 kg   52 kg  Height:        Intake/Output Summary (Last 24 hours) at 03/18/2019 1331 Last data filed at 03/18/2019 1208 Gross per 24 hour  Intake 1824.93 ml  Output 276 ml  Net 1548.93 ml   Filed Weights   03/16/19 1800 03/17/19 0700 03/18/19 0456  Weight: 47 kg 50.7 kg 52 kg    Examination:  General exam: NAD Respiratory system: CTAB.  No wheezing, no crackles, no rhonchi.  Normal respiratory effort.   Cardiovascular system: RRR no murmurs rubs or gallops.  No JVD.  No lower extremity edema.  Gastrointestinal system: Abdomen is soft, mildly distended, decreasing tenderness to palpation diffusely, positive bowel sounds.  No rebound.  No guarding.  Central nervous  system: Alert and oriented. No focal neurological deficits. Extremities: Symmetric 5 x 5 power. Skin: No rashes, lesions or ulcers Psychiatry: Judgement and insight appear normal. Mood & affect appropriate.     Data Reviewed: Theresa have personally reviewed following labs and imaging studies  CBC: Recent Labs  Lab 03/11/19 1906 03/15/19 1940 03/16/19 0530 03/17/19 0333 03/18/19 0312  WBC 7.0 7.1 6.3 4.8 4.6  NEUTROABS  --   --   --  2.4  --   HGB 13.0 13.8 11.0* 10.1* 10.1*  HCT 39.9 42.7 35.2* 31.7* 31.6*  MCV 98.3 99.1 101.1* 99.7 99.1  PLT 219 200 168 156 [id number]   Basic Metabolic Panel: Recent Labs  Lab 03/15/19 1940 03/16/19 0530 03/17/19 0333 03/18/19 0312 03/18/19 1151  NA 138 136 139 139 140  K 3.1* 4.0 3.0* 2.6* 2.9*  CL 99 105 109 109 105  CO2 25 21* 25 25 24   GLUCOSE 86 64* 130* 107* 83  BUN 11 9 <5* <5* <5*  CREATININE 0.62 0.57 0.47 0.42* 0.49  CALCIUM 8.8* 7.6* 7.7* 7.7* 8.0*  MG 2.6*  --   --  1.8  --    GFR: Estimated Creatinine  Clearance: 41.4 mL/min (by C-G formula based on SCr of 0.49 mg/dL). Liver Function Tests: Recent Labs  Lab 03/11/19 1906 03/15/19 1940  AST 21 16  ALT 7 7  ALKPHOS 51 48  BILITOT 0.4 1.1  PROT 6.7 7.0  ALBUMIN 3.2* 3.4*   No results for input(s): LIPASE, AMYLASE in the last 168 hours. No results for input(s): AMMONIA in the last 168 hours. Coagulation Profile: No results for input(s): INR, PROTIME in the last 168 hours. Cardiac Enzymes: No results for input(s): CKTOTAL, CKMB, CKMBINDEX, TROPONINI in the last 168 hours. BNP (last 3 results) No results for input(s): PROBNP in the last 8760 hours. HbA1C: No results for input(s): HGBA1C in the last 72 hours. CBG: Recent Labs  Lab 03/17/19 1613  GLUCAP 100*   Lipid Profile: No results for input(s): CHOL, HDL, LDLCALC, TRIG, CHOLHDL, LDLDIRECT in the last 72 hours. Thyroid Function Tests: No results for input(s): TSH, T4TOTAL, FREET4, T3FREE, THYROIDAB in the last 72  hours. Anemia Panel: No results for input(s): VITAMINB12, FOLATE, FERRITIN, TIBC, IRON, RETICCTPCT in the last 72 hours. Sepsis Labs: Recent Labs  Lab 03/15/19 1940  LATICACIDVEN 1.5    Recent Results (from the past 240 hour(s))  SARS CORONAVIRUS 2 (TAT 6-24 HRS) Nasopharyngeal Nasopharyngeal Swab     Status: None   Collection Time: 03/15/19 10:22 PM   Specimen: Nasopharyngeal Swab  Result Value Ref Range Status   SARS Coronavirus 2 NEGATIVE NEGATIVE Final    Comment: (NOTE) SARS-CoV-2 target nucleic acids are NOT DETECTED. The SARS-CoV-2 RNA is generally detectable in upper and lower respiratory specimens during the acute phase of infection. Negative results do not preclude SARS-CoV-2 infection, do not rule out co-infections with other pathogens, and should not be used as the sole basis for treatment or other patient management decisions. Negative results must be combined with clinical observations, patient history, and epidemiological information. The expected result is Negative. Fact Sheet for Patients: SugarRoll.be Fact Sheet for Healthcare Providers: https://www.woods-mathews.com/ This test is not yet approved or cleared by the [place] FDA and  has been authorized for detection and/or diagnosis of SARS-CoV-2 by FDA under an Emergency Use Authorization (EUA). This EUA will remain  in effect (meaning this test can be used) for the duration of the COVID-19 declaration under Section 56 4(b)(1) of the Act, 21 U.S.C. section 360bbb-3(b)(1), unless the authorization is terminated or revoked sooner. Performed at [facility], [place] [address] [place], [place] [address]          Radiology Studies: Dg Abd 1 View  Result Date: 03/18/2019 CLINICAL DATA:  History of sigmoid diverticulitis with associated small abscess by CT scan 03/11/2019. Possible colon stricture. EXAM: ABDOMEN - 1 VIEW COMPARISON:  Chest and two views  abdomen 03/15/2019. CT abdomen and pelvis 03/11/2019. FINDINGS: No evidence of free intraperitoneal air is seen on supine films. There is mild gaseous distention of the colon and scattered small bowel loops, improved since the comparison plain films. There is some gas in the rectum. No abnormal abdominal calcification. No acute bony abnormality. IMPRESSION: Mild gaseous distention of the colon appears improved compared to the most recent exam. Electronically Signed   By: Inge Rise M.D.   On: 03/18/2019 08:27        Scheduled Meds:  bisacodyl  10 mg Rectal Daily   lip balm  1 application Topical BID   mometasone-formoterol  2 puff Inhalation BID   polyethylene glycol  17 g Oral BID   sodium  chloride flush  3 mL Intravenous Q12H   umeclidinium bromide  1 puff Inhalation Daily   Continuous Infusions:  sodium chloride     lactated ringers     piperacillin-tazobactam (ZOSYN)  IV 3.375 g (03/18/19 0506)     LOS: 3 days    Time spent: 35 minutes    Irine Seal, MD [facility]  If 7PM-7AM, please contact night-coverage [email or web address] 03/18/2019, 1:31 PM

## 2019-03-18 NOTE — Progress Notes (Signed)
     [facility] Progress Note  CC:  Diverticulitis, diverticular stricture  Subjective:  Abdominal x-ray this AM shows improvement, but abdomen still seems quite distended/bloated.  Had 3 or 4 BM's this AM.  Tolerating full liquids.  Walking around halls/ambulating.  Objective:  Vital signs in last 24 hours: Temp:  [97.8 F (36.6 C)-99.5 F (37.5 C)] 97.8 F (36.6 C) (09/05 0456) Pulse Rate:  [58-74] 58 (09/05 0456) Resp:  [16-17] 16 (09/05 0456) BP: (98-127)/(55-73) 127/65 (09/05 0456) SpO2:  [100 %] 100 % (09/04 2128) Weight:  [52 kg] 52 kg (09/05 0456) Last BM Date: (P) 03/18/19 General:  Alert, Well-developed, in NAD Heart:  Regular rate and rhythm; no murmurs Pulm:  CTAB.  No increased WOB. Abdomen:  Soft, still distended.  BS present and tinkling.  Tympanitic.  Diffuse TTP. Extremities:  Without edema. Neurologic:  Alert and oriented x 4;  grossly normal neurologically. Psych:  Alert and cooperative. Normal mood and affect.  Intake/Output from previous day: 09/04 0701 - 09/05 0700 In: 2221.9 [P.O.:610; I.V.:1430; IV Piggyback:181.9] Out: 276 [Urine:275; Stool:1]  Lab Results: Recent Labs    03/16/19 0530 03/17/19 0333 03/18/19 0312  WBC 6.3 4.8 4.6  HGB 11.0* 10.1* 10.1*  HCT 35.2* 31.7* 31.6*  PLT 168 156 168   BMET Recent Labs    03/16/19 0530 03/17/19 0333 03/18/19 0312  NA 136 139 139  K 4.0 3.0* 2.6*  CL 105 109 109  CO2 21* 25 25  GLUCOSE 64* 130* 107*  BUN 9 <5* <5*  CREATININE 0.57 0.47 0.42*  CALCIUM 7.6* 7.7* 7.7*   LFT Recent Labs    03/15/19 1940  PROT 7.0  ALBUMIN 3.4*  AST 16  ALT 7  ALKPHOS 48  BILITOT 1.1   Dg Abd 1 View  Result Date: 03/18/2019 CLINICAL DATA:  History of sigmoid diverticulitis with associated small abscess by CT scan 03/11/2019. Possible colon stricture. EXAM: ABDOMEN - 1 VIEW COMPARISON:  Chest and two views abdomen 03/15/2019. CT abdomen and pelvis 03/11/2019. FINDINGS: No evidence of free  intraperitoneal air is seen on supine films. There is mild gaseous distention of the colon and scattered small bowel loops, improved since the comparison plain films. There is some gas in the rectum. No abnormal abdominal calcification. No acute bony abnormality. IMPRESSION: Mild gaseous distention of the colon appears improved compared to the most recent exam. Electronically Signed   By: Inge Rise M.D.   On: 03/18/2019 08:27   Assessment / Plan: *86 year old female with complaints of abdominal pain and constipation, recurrent diverticulitis with fluid collection and sigmoid stricture:CT scan with possible acute flare.  *Hypokalemia:  2.6 this AM.  Replacement per primary service.  Ideal to keep it at 4.0 or above for optimal gut function.  -Surgery saw the patient here as well and we appreciate their input. -Agree with IV abx and conservative measures. -Would maintain on Miralaxtwice daily. -Needs surgery, the question is when (inpatient vs outpatient) pending her course.   LOS: 3 days   Laban Emperor. Vernella Niznik  03/18/2019, 8:54 AM

## 2019-03-19 DIAGNOSIS — B370 Candidal stomatitis: Secondary | ICD-10-CM

## 2019-03-19 LAB — CBC WITH DIFFERENTIAL/PLATELET
Abs Immature Granulocytes: 0.01 10*3/uL (ref 0.00–0.07)
Basophils Absolute: 0.1 10*3/uL (ref 0.0–0.1)
Basophils Relative: 1 %
Eosinophils Absolute: 0.1 10*3/uL (ref 0.0–0.5)
Eosinophils Relative: 1 %
HCT: 34.4 % — ABNORMAL LOW (ref 36.0–46.0)
Hemoglobin: 10.9 g/dL — ABNORMAL LOW (ref 12.0–15.0)
Immature Granulocytes: 0 %
Lymphocytes Relative: 33 %
Lymphs Abs: 1.9 10*3/uL (ref 0.7–4.0)
MCH: 31.3 pg (ref 26.0–34.0)
MCHC: 31.7 g/dL (ref 30.0–36.0)
MCV: 98.9 fL (ref 80.0–100.0)
Monocytes Absolute: 0.5 10*3/uL (ref 0.1–1.0)
Monocytes Relative: 9 %
Neutro Abs: 3.2 10*3/uL (ref 1.7–7.7)
Neutrophils Relative %: 56 %
Platelets: 175 10*3/uL (ref 150–400)
RBC: 3.48 MIL/uL — ABNORMAL LOW (ref 3.87–5.11)
RDW: 14.2 % (ref 11.5–15.5)
WBC: 5.7 10*3/uL (ref 4.0–10.5)
nRBC: 0 % (ref 0.0–0.2)

## 2019-03-19 LAB — MAGNESIUM: Magnesium: 2 mg/dL (ref 1.7–2.4)

## 2019-03-19 LAB — BASIC METABOLIC PANEL
Anion gap: 11 (ref 5–15)
BUN: <5 mg/dL — ABNORMAL LOW (ref 8–23)
CO2: 28 mmol/L (ref 22–32)
Calcium: 8 mg/dL — ABNORMAL LOW (ref 8.9–10.3)
Chloride: 101 mmol/L (ref 98–111)
Creatinine, Ser: 0.53 mg/dL (ref 0.44–1.00)
GFR calc Af Amer: >60 mL/min (ref >60)
GFR calc non Af Amer: >60 mL/min (ref >60)
Glucose, Bld: 81 mg/dL (ref 70–99)
Potassium: 2.6 mmol/L — CL (ref 3.5–5.1)
Sodium: 140 mmol/L (ref 135–145)

## 2019-03-19 MED ORDER — POTASSIUM CHLORIDE CRYS ER 20 MEQ PO TBCR
40.0000 meq | EXTENDED_RELEASE_TABLET | ORAL | Status: DC
  Administered 2019-03-19: 40 meq via ORAL
  Filled 2019-03-19: qty 2

## 2019-03-19 MED ORDER — POTASSIUM CHLORIDE 10 MEQ/100ML IV SOLN
10.0000 meq | INTRAVENOUS | Status: DC
  Administered 2019-03-19 (×3): 10 meq via INTRAVENOUS
  Filled 2019-03-19 (×3): qty 100

## 2019-03-19 MED ORDER — POTASSIUM CHLORIDE 20 MEQ PO PACK
40.0000 meq | PACK | Freq: Once | ORAL | Status: AC
  Administered 2019-03-19: 15:00:00 40 meq via ORAL
  Filled 2019-03-19: qty 2

## 2019-03-19 NOTE — Progress Notes (Addendum)
PROGRESS NOTE    POETRY SIS  [id number] DOB: Sep 22, 1931 DOA: 83/08/2018 PCP: Biagio Borg, MD    Brief Narrative:  Per Dr. Dondra Prader I Theresa Arnold is a 85 y.o. female with medical history significant for COPD, hyperlipidemia, and chronic sigmoid diverticulitis with known abscess and stricture in the distal sigmoid colon who presented to the ED for evaluation of abdominal pain.  Patient follows with GI, Dr. Tarri Glenn, for known sigmoid diverticulitis and abscess.  Per GI documentation, case was discussed with IR and fluid collection was not considered amenable to IR drainage.  She underwent colonoscopy 01/27/2019 which was limited due to presence of stool.  Distal sigmoid stricture was seen.  She was reportedly referred to general surgery, [facility] Dr. Dema Severin, and has no present plans for surgical intervention.  Patient was seen in the ED on 03/11/2019 at which time a repeat CT abdomen/pelvis with contrast showed an apparent increase in proximal sigmoid colon wall thickening and unchanged 3.5 cm intramural sigmoid colonic wall abscess/collection which was present since 11/02/2018.  There was question of low-grade obstruction. She was given a prescription for ciprofloxacin and metronidazole.  Patient stated that she has had continued generalized abdominal pain.  She has not had oral intake due to the nausea after eating.  She has been unable to take her home medications including the recently prescribed antibiotics.  She stated that she has not had any bowel movements despite use of MiraLAX for several days.  She reported a 30 pound weight loss since last year.  She has not had any subjective fevers.  She reported good urine output without dysuria.  ED Course:  Initial vitals showed BP 110/71, pulse 82, RR 23, temp 97.6 Fahrenheit, SPO2 98% on room air  Labs notable for potassium 3.1, magnesium 2.6, BUN 11, creatinine 0.62, WBC 7.1, hemoglobin 13.8, platelets 200,000.  Abdominal x-ray showed  sub-pathologic gaseous distention of small bowel loops and colon which may represent obstruction at the distal colon or ileus.  Patient was given IV fluids, Protonix, Zofran, and the hospitalist service was consulted to admit for further evaluation and management.   Assessment & Plan:   Principal Problem:   Sigmoid stricture (HCC) Active Problems:   Diverticulitis of sigmoid colon   COPD (chronic obstructive pulmonary disease) (HCC)   Abnormal CT scan, gastrointestinal tract   Colonic partial obstruction from sigmoid stricture   Polyp of sigmoid colon   Oral thrush  1 chronic/recurrent sigmoid diverticulitis with known abscess and distal sigmoid stricture Patient with recurrent symptoms.  Patient noted to have some intermittent abdominal pain with weight loss felt secondary to low-grade diverticular stricture.  Patient is being followed by GI who had previously recommended surgery and patient was referred to general surgery.  Patient has been advanced to a full liquid diet per general surgery on 03/17/2019 however patient unable to tolerate full liquid diet with some abdominal discomfort.  Patient on MiraLAX 17 g twice daily. General surgery feels patient will benefit from resection given recurrence of her symptoms, weight loss.  Patient initially seemed apprehensive however seems more agreeable to surgery now if needed.  Patient's diet has been downgraded to n.p.o. per general surgery who are recommending sigmoid colectomy in the near future electively.  Per general surgery if no significant improvement may likely need surgery during this hospitalization.  Patient denies any recent chest pain or shortness of breath.  Patient denies any history of MI.  Patient able to tolerate 4 METS.  EKG with no  ischemic changes noted.  Chest x-ray done with no acute infiltrate.  Patient with sats of 100% on room air.  Continue Dulera and Spiriva to optimize pulmonary function as patient with history of COPD.   Duo nebs as needed.  Patient hesitant to use inhalers. Patient with history of COPD however not O2 dependent.  Per Lyndel Safe perioperative cardiac risk patient is an estimated risk of 0.32% for perioperative MI..  Continue IV fluids.  Supportive care.  Continue IV antibiotics.  General surgery and GI following and appreciate input and recommendations.  2.  Hypokalemia Likely secondary to GI losses.  Potassium at 2.6 today.  Patient complaining of burning at potassium IV infusion site.  Will discontinue IV potassium.  Place on oral potassium K. Dur 40 mEq p.o. every 4 hours x2 doses.  Magnesium at 2.0.  Repeat labs in the morning.  Follow.   3.  COPD Stable.  Albuterol as needed.  4.  Oral thrush Placed on nystatin swish and swallow.  IV Diflucan x7 days.   DVT prophylaxis: SCDs Code Status: Full Family Communication: Updated patient.  No family at bedside. Disposition Plan Discharge when okay with GI and general surgery.   Consultants:   Gastroenterology: Dr. Tarri Glenn 03/16/2019  General surgery: Dr. Dema Severin 03/16/2019  Procedures:   Acute abdominal series 03/15/2019    Antimicrobials:   IV Zosyn 03/15/2019   Subjective: Patient sitting up in chair.  Complaining of burning around potassium IV infusion site.  Still with some abdominal pain however focusing more on IV site in left upper extremity.  No bowel movement.  No chest pain.  No shortness of breath.    Objective: Vitals:   03/18/19 0456 03/18/19 1416 03/18/19 2128 03/19/19 0549  BP: 127/65 131/64 (!) 142/71 138/68  Pulse: (!) 58 (!) 53 63 (!) 56  Resp: 16 16 17 16   Temp: 97.8 F (36.6 C) 98 F (36.7 C) 98.9 F (37.2 C) 98.2 F (36.8 C)  TempSrc: Oral  Oral Oral  SpO2:  95% 97% 98%  Weight: 52 kg   50.5 kg  Height:        Intake/Output Summary (Last 24 hours) at 03/19/2019 1254 Last data filed at 03/19/2019 1100 Gross per 24 hour  Intake 846.82 ml  Output 1450 ml  Net -603.18 ml   Filed Weights   03/17/19 0700  03/18/19 0456 03/19/19 0549  Weight: 50.7 kg 52 kg 50.5 kg    Examination:  General exam: NAD Respiratory system: Lungs clear to auscultation bilaterally.  No wheezes, no crackles, no rhonchi.  Normal respiratory effort.  Cardiovascular system: Regular rate rhythm no murmurs rubs or gallops.  No JVD.  No lower extremity edema.  Gastrointestinal system: Abdomen is mildly distended, soft, positive bowel sounds, some decreased tenderness to palpation.  No rebound.  No guarding.   Central nervous system: Alert and oriented. No focal neurological deficits. Extremities: Symmetric 5 x 5 power. Skin: No rashes, lesions or ulcers Psychiatry: Judgement and insight appear normal. Mood & affect appropriate.     Data Reviewed: I have personally reviewed following labs and imaging studies  CBC: Recent Labs  Lab 03/15/19 1940 03/16/19 0530 03/17/19 0333 03/18/19 0312 03/19/19 0453  WBC 7.1 6.3 4.8 4.6 5.7  NEUTROABS  --   --  2.4  --  3.2  HGB 13.8 11.0* 10.1* 10.1* 10.9*  HCT 42.7 35.2* 31.7* 31.6* 34.4*  MCV 99.1 101.1* 99.7 99.1 98.9  PLT 200 168 156 168 0000000   Basic Metabolic  Panel: Recent Labs  Lab 03/15/19 1940 03/16/19 0530 03/17/19 0333 03/18/19 0312 03/18/19 1151 03/19/19 0453  NA 138 136 139 139 140 140  K 3.1* 4.0 3.0* 2.6* 2.9* 2.6*  CL 99 105 109 109 105 101  CO2 25 21* 25 25 24 28   GLUCOSE 86 64* 130* 107* 83 81  BUN 11 9 <5* <5* <5* <5*  CREATININE 0.62 0.57 0.47 0.42* 0.49 0.53  CALCIUM 8.8* 7.6* 7.7* 7.7* 8.0* 8.0*  MG 2.6*  --   --  1.8  --  2.0   GFR: Estimated Creatinine Clearance: 41.4 mL/min (by C-G formula based on SCr of 0.53 mg/dL). Liver Function Tests: Recent Labs  Lab 03/15/19 1940  AST 16  ALT 7  ALKPHOS 48  BILITOT 1.1  PROT 7.0  ALBUMIN 3.4*   No results for input(s): LIPASE, AMYLASE in the last 168 hours. No results for input(s): AMMONIA in the last 168 hours. Coagulation Profile: No results for input(s): INR, PROTIME in the last  168 hours. Cardiac Enzymes: No results for input(s): CKTOTAL, CKMB, CKMBINDEX, TROPONINI in the last 168 hours. BNP (last 3 results) No results for input(s): PROBNP in the last 8760 hours. HbA1C: No results for input(s): HGBA1C in the last 72 hours. CBG: Recent Labs  Lab 03/17/19 1613  GLUCAP 100*   Lipid Profile: No results for input(s): CHOL, HDL, LDLCALC, TRIG, CHOLHDL, LDLDIRECT in the last 72 hours. Thyroid Function Tests: No results for input(s): TSH, T4TOTAL, FREET4, T3FREE, THYROIDAB in the last 72 hours. Anemia Panel: No results for input(s): VITAMINB12, FOLATE, FERRITIN, TIBC, IRON, RETICCTPCT in the last 72 hours. Sepsis Labs: Recent Labs  Lab 03/15/19 1940  LATICACIDVEN 1.5    Recent Results (from the past 240 hour(s))  SARS CORONAVIRUS 2 (TAT 6-24 HRS) Nasopharyngeal Nasopharyngeal Swab     Status: None   Collection Time: 03/15/19 10:22 PM   Specimen: Nasopharyngeal Swab  Result Value Ref Range Status   SARS Coronavirus 2 NEGATIVE NEGATIVE Final    Comment: (NOTE) SARS-CoV-2 target nucleic acids are NOT DETECTED. The SARS-CoV-2 RNA is generally detectable in upper and lower respiratory specimens during the acute phase of infection. Negative results do not preclude SARS-CoV-2 infection, do not rule out co-infections with other pathogens, and should not be used as the sole basis for treatment or other patient management decisions. Negative results must be combined with clinical observations, patient history, and epidemiological information. The expected result is Negative. Fact Sheet for Patients: SugarRoll.be Fact Sheet for Healthcare Providers: https://www.woods-mathews.com/ This test is not yet approved or cleared by the [place] FDA and  has been authorized for detection and/or diagnosis of SARS-CoV-2 by FDA under an Emergency Use Authorization (EUA). This EUA will remain  in effect (meaning this test can be  used) for the duration of the COVID-19 declaration under Section 56 4(b)(1) of the Act, 21 U.S.C. section 360bbb-3(b)(1), unless the authorization is terminated or revoked sooner. Performed at [facility], [place] [address], [place], [place] [address]          Radiology Studies: Dg Abd 1 View  Result Date: 03/18/2019 CLINICAL DATA:  History of sigmoid diverticulitis with associated small abscess by CT scan 03/11/2019. Possible colon stricture. EXAM: ABDOMEN - 1 VIEW COMPARISON:  Chest and two views abdomen 03/15/2019. CT abdomen and pelvis 03/11/2019. FINDINGS: No evidence of free intraperitoneal air is seen on supine films. There is mild gaseous distention of the colon and scattered small bowel loops, improved since the  comparison plain films. There is some gas in the rectum. No abnormal abdominal calcification. No acute bony abnormality. IMPRESSION: Mild gaseous distention of the colon appears improved compared to the most recent exam. Electronically Signed   By: Inge Rise M.D.   On: 03/18/2019 08:27        Scheduled Meds:  bisacodyl  10 mg Rectal Daily   lip balm  1 application Topical BID   mometasone-formoterol  2 puff Inhalation BID   nystatin  5 mL Oral QID   polyethylene glycol  17 g Oral BID   potassium chloride  40 mEq Oral Q4H   sodium chloride flush  3 mL Intravenous Q12H   umeclidinium bromide  1 puff Inhalation Daily   Continuous Infusions:  sodium chloride Stopped (03/19/19 0507)   fluconazole (DIFLUCAN) IV Stopped (03/18/19 2030)   lactated ringers     piperacillin-tazobactam (ZOSYN)  IV 12.5 mL/hr at 03/19/19 0600     LOS: 4 days    Time spent: 35 minutes    Irine Seal, MD [facility]  If 7PM-7AM, please contact night-coverage [email or web address] 03/19/2019, 12:54 PM

## 2019-03-19 NOTE — Progress Notes (Addendum)
CRITICAL VALUE ALERT  Critical Value:  K+ 2.6  Date & Time Notied:  9/6 0603  Provider Notified: Bodenheimer  Orders Received/Actions taken:  See [facility]

## 2019-03-19 NOTE — Progress Notes (Signed)
Occupational Therapy Treatment Patient Details Name: Theresa Arnold MRN: [id number] DOB: [birth year] Today's Date: 03/19/2019    History of present illness 85yo female who presented to the ED with abdominal pain. Admitted for chronic/recurrent sigmoid diverticulitis with known abscess and distal sigmoid stricture. PMH cervical radiculopathy and spondylosis, COPD, chronic diverticulitis with abscess   OT comments  Pt received awake and alert with complaints of abdominal pain and LUE pain. Pt required max encouragement to engage in session. Session limited to grooming task while seated in bed with set up required and education of positioning. Heat administered at the end of session. Pt will benefit from continued acute OT to address deficits to maximize independence in ADLs. DC and freq remains the same.    Follow Up Recommendations  No OT follow up;Supervision - Intermittent    Equipment Recommendations  None recommended by OT    Recommendations for Other Services      Precautions / Restrictions Precautions Precautions: Fall Restrictions Weight Bearing Restrictions: No       Mobility Bed Mobility Overal bed mobility: Modified Independent             General bed mobility comments: increased time required due to pain  Transfers Overall transfer level: Needs assistance Equipment used: None             General transfer comment: session limited to sitting.    Balance Overall balance assessment: Needs assistance Sitting-balance support: Feet supported;No upper extremity supported Sitting balance-Leahy Scale: Normal     Standing balance support: Bilateral upper extremity supported;During functional activity Standing balance-Leahy Scale: Fair Standing balance comment: intermittent single extremity support especially for dynamic tasks                           ADL either performed or assessed with clinical judgement   ADL Overall ADL's : Needs  assistance/impaired     Grooming: Set up;Supervision/safety;Sitting                                 General ADL Comments: session limited due to abdominal pain and new onset of LUE pain. Pt engaged in grooming while seated with heat applied and positioning with pillow at end of session.     Vision       Perception     Praxis      Cognition Arousal/Alertness: Awake/alert Behavior During Therapy: WFL for tasks assessed/performed Overall Cognitive Status: Within Functional Limits for tasks assessed                                          Exercises     Shoulder Instructions       General Comments      Pertinent Vitals/ Pain       Pain Assessment: Faces Pain Score: 8  Pain Location: abdomen and L arm Pain Descriptors / Indicators: Discomfort Pain Intervention(s): Monitored during session;Limited activity within patient's tolerance;Repositioned;Heat applied  Home Living                                          Prior Functioning/Environment              Frequency  Min  2X/week        Progress Toward Goals  OT Goals(current goals can now be found in the care plan section)  Progress towards OT goals: Progressing toward goals  Acute Rehab OT Goals Patient Stated Goal: go home, have less pain; maintain independence OT Goal Formulation: With patient Time For Goal Achievement: 03/24/19 Potential to Achieve Goals: Good ADL Goals Pt Will Perform Lower Body Bathing: with modified independence;sit to/from stand Pt Will Perform Lower Body Dressing: with modified independence;sit to/from stand Pt Will Perform Tub/Shower Transfer: Shower transfer;with modified independence;shower seat  Plan Discharge plan remains appropriate;Discharge plan needs to be updated    Co-evaluation                 AM-PAC OT "6 Clicks" Daily Activity     Outcome Measure   Help from another person eating meals?: None Help from  another person taking care of personal grooming?: None Help from another person toileting, which includes using toliet, bedpan, or urinal?: A Little Help from another person bathing (including washing, rinsing, drying)?: A Little Help from another person to put on and taking off regular upper body clothing?: None Help from another person to put on and taking off regular lower body clothing?: A Little 6 Click Score: 21    End of Session    OT Visit Diagnosis: Unsteadiness on feet (R26.81);Pain   Activity Tolerance Patient limited by pain   Patient Left Other (comment);with call bell/phone within reach(on toilet, requesting privacy and time)   Nurse Communication          Time: [id number] OT Time Calculation (min): 11 min  Charges: OT General Charges $OT Visit: 1 Visit OT Treatments $Self Care/Home Management : 8-22 mins  Minus Breeding, MSOT, OTR/L  Supplemental Rehabilitation Services  [phone]    Marius Ditch 03/19/2019, 10:37 AM

## 2019-03-19 NOTE — Progress Notes (Signed)
Assessment & Plan: Sigmoid colonic stricture with partial obstruction, secondary to diverticular disease Colonic polyp  See Dr. Johney Maine' note from yesterday  Patient seems more amenable to surgery at this point - wants husband to discuss with surgeon  Timing of surgery - given holiday weekend, earliest date likely Tuesday  Will allow clear liquid diet today  Encouraged ambulation, IS use        [name], MD       [facility]       Office: [phone]   Chief Complaint: Partial colonic obstruction secondary to stricture, colonic polyp  Subjective: Patient in bed, nurse in room. Complains of pain left arm due to potassium infusion.  Objective: Vital signs in last 24 hours: Temp:  [98 F (36.7 C)-98.9 F (37.2 C)] 98.2 F (36.8 C) (09/06 0549) Pulse Rate:  [53-63] 56 (09/06 0549) Resp:  [16-17] 16 (09/06 0549) BP: (131-142)/(64-71) 138/68 (09/06 0549) SpO2:  [95 %-98 %] 98 % (09/06 0549) Weight:  [50.5 kg] 50.5 kg (09/06 0549) Last BM Date: 03/18/19  Intake/Output from previous day: 09/05 0701 - 09/06 0700 In: 988.4 [P.O.:600; I.V.:58.6; IV Piggyback:329.8] Out: 1300 [Urine:1300] Intake/Output this shift: Total I/O In: 157.2 [IV Piggyback:157.2] Out: 550 [Urine:550]  Physical Exam: HEENT - sclerae clear, mucous membranes moist Neck - soft Abdomen - mild distension; non-tender; no surgical incisions; fullness LLQ Ext - no edema, non-tender Neuro - alert & oriented, no focal deficits  Lab Results:  Recent Labs    03/18/19 0312 03/19/19 0453  WBC 4.6 5.7  HGB 10.1* 10.9*  HCT 31.6* 34.4*  PLT 168 175   BMET Recent Labs    03/18/19 1151 03/19/19 0453  NA 140 140  K 2.9* 2.6*  CL 105 101  CO2 24 28  GLUCOSE 83 81  BUN <5* <5*  CREATININE 0.49 0.53  CALCIUM 8.0* 8.0*   PT/INR No results for input(s): LABPROT, INR in the last 72 hours. Comprehensive Metabolic Panel:    Component Value Date/Time   NA 140 03/19/2019 0453    NA 140 03/18/2019 1151   K 2.6 (LL) 03/19/2019 0453   K 2.9 (L) 03/18/2019 1151   CL 101 03/19/2019 0453   CL 105 03/18/2019 1151   CO2 28 03/19/2019 0453   CO2 24 03/18/2019 1151   BUN <5 (L) 03/19/2019 0453   BUN <5 (L) 03/18/2019 1151   CREATININE 0.53 03/19/2019 0453   CREATININE 0.49 03/18/2019 1151   GLUCOSE 81 03/19/2019 0453   GLUCOSE 83 03/18/2019 1151   CALCIUM 8.0 (L) 03/19/2019 0453   CALCIUM 8.0 (L) 03/18/2019 1151   AST 16 03/15/2019 1940   AST 21 03/11/2019 1906   ALT 7 03/15/2019 1940   ALT 7 03/11/2019 1906   ALKPHOS 48 03/15/2019 1940   ALKPHOS 51 03/11/2019 1906   BILITOT 1.1 03/15/2019 1940   BILITOT 0.4 03/11/2019 1906   PROT 7.0 03/15/2019 1940   PROT 6.7 03/11/2019 1906   ALBUMIN 3.4 (L) 03/15/2019 1940   ALBUMIN 3.2 (L) 03/11/2019 1906    Studies/Results: Dg Abd 1 View  Result Date: 03/18/2019 CLINICAL DATA:  History of sigmoid diverticulitis with associated small abscess by CT scan 03/11/2019. Possible colon stricture. EXAM: ABDOMEN - 1 VIEW COMPARISON:  Chest and two views abdomen 03/15/2019. CT abdomen and pelvis 03/11/2019. FINDINGS: No evidence of free intraperitoneal air is seen on supine films. There is mild gaseous distention of the colon and scattered small bowel loops, improved since  the comparison plain films. There is some gas in the rectum. No abnormal abdominal calcification. No acute bony abnormality. IMPRESSION: Mild gaseous distention of the colon appears improved compared to the most recent exam. Electronically Signed   By: Inge Rise M.D.   On: 03/18/2019 08:27      [name] [id number]  Patient ID: Theresa Arnold, female   DOB: May 06, 1932, 85 y.o.   MRN: [id number]

## 2019-03-20 ENCOUNTER — Inpatient Hospital Stay (HOSPITAL_COMMUNITY): Payer: Medicare Other

## 2019-03-20 LAB — BASIC METABOLIC PANEL
Anion gap: 11 (ref 5–15)
BUN: <5 mg/dL — ABNORMAL LOW (ref 8–23)
CO2: 24 mmol/L (ref 22–32)
Calcium: 8 mg/dL — ABNORMAL LOW (ref 8.9–10.3)
Chloride: 101 mmol/L (ref 98–111)
Creatinine, Ser: 0.45 mg/dL (ref 0.44–1.00)
GFR calc Af Amer: >60 mL/min (ref >60)
GFR calc non Af Amer: >60 mL/min (ref >60)
Glucose, Bld: 69 mg/dL — ABNORMAL LOW (ref 70–99)
Potassium: 3 mmol/L — ABNORMAL LOW (ref 3.5–5.1)
Sodium: 136 mmol/L (ref 135–145)

## 2019-03-20 LAB — CBC WITH DIFFERENTIAL/PLATELET
Abs Immature Granulocytes: 0.02 10*3/uL (ref 0.00–0.07)
Basophils Absolute: 0 10*3/uL (ref 0.0–0.1)
Basophils Relative: 1 %
Eosinophils Absolute: 0.1 10*3/uL (ref 0.0–0.5)
Eosinophils Relative: 2 %
HCT: 34.8 % — ABNORMAL LOW (ref 36.0–46.0)
Hemoglobin: 10.9 g/dL — ABNORMAL LOW (ref 12.0–15.0)
Immature Granulocytes: 0 %
Lymphocytes Relative: 31 %
Lymphs Abs: 1.9 10*3/uL (ref 0.7–4.0)
MCH: 31.3 pg (ref 26.0–34.0)
MCHC: 31.3 g/dL (ref 30.0–36.0)
MCV: 100 fL (ref 80.0–100.0)
Monocytes Absolute: 0.6 10*3/uL (ref 0.1–1.0)
Monocytes Relative: 10 %
Neutro Abs: 3.4 10*3/uL (ref 1.7–7.7)
Neutrophils Relative %: 56 %
Platelets: 173 10*3/uL (ref 150–400)
RBC: 3.48 MIL/uL — ABNORMAL LOW (ref 3.87–5.11)
RDW: 14.3 % (ref 11.5–15.5)
WBC: 6.1 10*3/uL (ref 4.0–10.5)
nRBC: 0 % (ref 0.0–0.2)

## 2019-03-20 LAB — MAGNESIUM: Magnesium: 1.9 mg/dL (ref 1.7–2.4)

## 2019-03-20 MED ORDER — POLYETHYLENE GLYCOL 3350 17 G PO PACK
34.0000 g | PACK | Freq: Two times a day (BID) | ORAL | Status: DC
  Administered 2019-03-20: 11:00:00 34 g via ORAL
  Filled 2019-03-20 (×4): qty 2

## 2019-03-20 MED ORDER — POTASSIUM CHLORIDE 20 MEQ PO PACK
40.0000 meq | PACK | ORAL | Status: AC
  Administered 2019-03-20 (×2): 40 meq via ORAL
  Filled 2019-03-20 (×2): qty 2

## 2019-03-20 NOTE — Progress Notes (Signed)
PT Cancellation Note  Patient Details Name: Theresa Arnold MRN: [id number] DOB: 03-10-1932   Cancelled Treatment:    Reason Eval/Treat Not Completed: Other (comment) patient just had suppository- RN recommends holding until PM due to this/patient declining PT due to fear of having BM in hallway or on floor. Will attempt to return later if time and schedule allow.   [name] PT, DPT, CBIS  Supplemental Physical Therapist [facility]    Pager [phone] Acute Rehab Office [phone]

## 2019-03-20 NOTE — Progress Notes (Signed)
Occupational Therapy Treatment Patient Details Name: [name] MRN: [id number] DOB: Nov 20, 1931 Today's Date: 03/20/2019    History of present illness 85yo female who presented to the ED with abdominal pain. Admitted for chronic/recurrent sigmoid diverticulitis with known abscess and distal sigmoid stricture. PMH cervical radiculopathy and spondylosis, COPD, chronic diverticulitis with abscess   OT comments  Pt with limited participation due to nausea  Follow Up Recommendations  No OT follow up;Supervision - Intermittent    Equipment Recommendations  None recommended by OT    Recommendations for Other Services      Precautions / Restrictions Precautions Precautions: Fall Restrictions Weight Bearing Restrictions: No       Mobility Bed Mobility Overal bed mobility: Modified Independent             General bed mobility comments: pt sitting EOB upon OT arrival and OT left pt sitting EOB. RN aware  Transfers Overall transfer level: Needs assistance Equipment used: (IV pole) Transfers: Sit to/from Stand           General transfer comment: session limited to sitting.    Balance Overall balance assessment: Needs assistance Sitting-balance support: Feet supported;No upper extremity supported Sitting balance-Leahy Scale: Normal     Standing balance support: Bilateral upper extremity supported;During functional activity Standing balance-Leahy Scale: Fair Standing balance comment: intermittent single extremity support especially for dynamic tasks                           ADL either performed or assessed with clinical judgement   ADL Overall ADL's : Needs assistance/impaired     Grooming: Standing;Min guard;Wash/dry Teacher, music: Min guard;Cueing for safety;Cueing for Marketing executive Details (indicate cue type and reason): with IV pole Toileting- Clothing Manipulation and Hygiene: Sit to/from  stand;Min guard;Cueing for safety;Cueing for sequencing         General ADL Comments: upon getting back to bed pt feeling nausous. provided with basin and Rn notified     Vision Patient Visual Report: No change from baseline            Cognition Arousal/Alertness: Awake/alert Behavior During Therapy: WFL for tasks assessed/performed Overall Cognitive Status: Within Functional Limits for tasks assessed                                                     Pertinent Vitals/ Pain       Pain Score: 4  Pain Location: abdomen Pain Descriptors / Indicators: Sore     Prior Functioning/Environment              Frequency  Min 2X/week        Progress Toward Goals  OT Goals(current goals can now be found in the care plan section)  Progress towards OT goals: Progressing toward goals     Plan Discharge plan remains appropriate;Discharge plan needs to be updated       AM-PAC OT "6 Clicks" Daily Activity     Outcome Measure   Help from another person eating meals?: None Help from another person taking care of personal grooming?: None Help from another person toileting, which includes using toliet, bedpan, or urinal?: A Little Help from another person bathing (  including washing, rinsing, drying)?: A Little Help from another person to put on and taking off regular upper body clothing?: None Help from another person to put on and taking off regular lower body clothing?: A Little 6 Click Score: 21    End of Session    OT Visit Diagnosis: Unsteadiness on feet (R26.81);Pain   Activity Tolerance Other (comment)(limited by nausea)   Patient Left Other (comment);with call bell/phone within reach;in bed(sitting EOB)   Nurse Communication          [id number] OT Time Calculation (min): 9 min  Charges: OT General Charges $OT Visit: 1 Visit OT Treatments $Self Care/Home Management : 8-22 mins  Kari Baars, [place] [phone] Office- [place], Edwena Felty D 03/20/2019, 5:50 PM

## 2019-03-20 NOTE — Progress Notes (Signed)
Theresa Arnold [id number] [birth year]  CARE TEAM:  PCP: Biagio Borg, MD  Outpatient Care Team: Patient Care Team: Biagio Borg, MD as PCP - General  Inpatient Treatment Team: Treatment Team: Attending Provider: Eugenie Filler, MD; Rounding Team: Redmond Baseman, MD; Consulting Physician: Thornton Park, MD; Lactation Consultant: Edison Pace, Md, MD; Technician: Berenice Bouton, [place]; Technician: Sue Lush, [place]; Occupational Therapist: Betsy Pries, OT; Case Manager: Frann Rider, RN; Technician: Burman Blacksmith, NT; Registered Nurse: Oleta Mouse, RN; Utilization Review: Beau Fanny, RN; Physical Therapist: Hunt Oris, PT   Problem List:   Principal Problem:   Sigmoid stricture [facility]) Active Problems:   COPD (chronic obstructive pulmonary disease) ([place])   Diverticulitis of sigmoid colon   Abnormal CT scan, gastrointestinal tract   Colonic partial obstruction from sigmoid stricture   Polyp of sigmoid colon   Oral thrush      * No surgery found *      Assessment  Partial colonic obstruction from sigmoid stricture.  Presumably due to chronic recurrent diverticulitis  [facility] Stay = 5 days)  Plan:  -I think it is a question of not if the patient needs a colectomy but when.  The patient seems much more open to it now.  I tried to frame it and that she may require 2 surgeries.  One urgent with colostomy and then ostomy takedown the future.  If she can become less distended and keep her nutrition up, perhaps we can do just a 1 stage operation.  -Try to clean her out with MiraLAX.  Gently increase since she is having some liquid bowel movements now.  I am skeptical that she will not fly and will need surgery this admission.  Sigmoid colectomy at the side of the stricture.  She also has a polyp in the sigmoid colon as well that gastroenterology would like [place] to remove en bloc.  That should not be an issue.  Risks of need for conversion to open or  ostomy higher.  While she does not like the idea of an ostomy, she understands that failure to improve is a sign that she is becoming fully obstructed and risk of bowel ischemia/perforation/aspiration = death very high.  After long discussion with Dr. Dema Severin yesterday as well as with me this morning, she is much more open to the idea of surgery.  She is discussed with her family & they agree.  As of yesterday, she wished to discuss with the operating surgeon along with her family.  I will defer to Dr. Hassell Done who will be assuming care for the week since we have the few urgent/emergent surgeries today.    Medicine feels her operative risks are not particularly high despite her advanced age and other issues.    -VTE prophylaxis- SCDs, etc -mobilize as tolerated to help recovery 30 minutes spent in review, evaluation, examination, counseling, and coordination of care.  More than 50% of that time was spent in counseling.  03/20/2019    Subjective: (Chief complaint)  Patient having bowel movement in bathroom.  No major events.  Not much appetite but able to keep some liquids down  Objective:  Vital signs:  Vitals:   03/19/19 0549 03/19/19 1501 03/19/19 2103 03/20/19 0617  BP: 138/68 (!) 156/86 (!) 147/60 136/71  Pulse: (!) 56 82 61 63  Resp: 16 14 16 18   Temp: 98.2 F (36.8 C) 97.9 F (36.6 C) 98.9 F (37.2 C) 98.1 F (36.7 C)  TempSrc: Oral Oral Oral Oral  SpO2: 98% 99% 97% 97%  Weight: 50.5 kg   45.9 kg  Height:        Last BM Date: 03/19/19  Intake/Output   Yesterday:  09/06 0701 - 09/07 0700 In: 615.8 [I.V.:162; IV Piggyback:453.8] Out: 2750 [Urine:2750] This shift:  Total I/O In: 240 [P.O.:240] Out: 300 [Urine:300]  Bowel function:  Flatus: YES  BM:  YES  Drain: (No drain)   Physical Exam:  General: Pt awake/alert/oriented x4 in no acute distress Eyes: PERRL, normal EOM.  Sclera clear.  No icterus Neuro: CN II-XII intact w/o focal sensory/motor  deficits. Lymph: No head/neck/groin lymphadenopathy Psych:  No delerium/psychosis/paranoia HENT: Normocephalic, Mucus membranes moist.  No thrush Neck: Supple, No tracheal deviation Chest: No chest wall pain w good excursion CV:  Pulses intact.  Regular rhythm MS: Normal AROM mjr joints.  No obvious deformity  Abdomen: Soft.  Mildy distended.  Nontender.  No evidence of peritonitis.  No incarcerated hernias.  Ext:   No deformity.  No mjr edema.  No cyanosis Skin: No petechiae / purpura  Results:   Cultures: Recent Results (from the past 720 hour(s))  SARS CORONAVIRUS 2 (TAT 6-24 HRS) Nasopharyngeal Nasopharyngeal Swab     Status: None   Collection Time: 03/15/19 10:22 PM   Specimen: Nasopharyngeal Swab  Result Value Ref Range Status   SARS Coronavirus 2 NEGATIVE NEGATIVE Final    Comment: (NOTE) SARS-CoV-2 target nucleic acids are NOT DETECTED. The SARS-CoV-2 RNA is generally detectable in upper and lower respiratory specimens during the acute phase of infection. Negative results do not preclude SARS-CoV-2 infection, do not rule out co-infections with other pathogens, and should not be used as the sole basis for treatment or other patient management decisions. Negative results must be combined with clinical observations, patient history, and epidemiological information. The expected result is Negative. Fact Sheet for Patients: SugarRoll.be Fact Sheet for Healthcare Providers: https://www.woods-mathews.com/ This test is not yet approved or cleared by the [place] FDA and  has been authorized for detection and/or diagnosis of SARS-CoV-2 by FDA under an Emergency Use Authorization (EUA). This EUA will remain  in effect (meaning this test can be used) for the duration of the COVID-19 declaration under Section 56 4(b)(1) of the Act, 21 U.S.C. section 360bbb-3(b)(1), unless the authorization is terminated or revoked sooner. Performed  at [facility], [place] [address] [place], [place] [address]     Labs: Results for orders placed or performed during the hospital encounter of 03/15/19 (from the past 48 hour(s))  Basic metabolic panel     Status: Abnormal   Collection Time: 03/18/19 11:51 AM  Result Value Ref Range   Sodium 140 135 - 145 mmol/L   Potassium 2.9 (L) 3.5 - 5.1 mmol/L   Chloride 105 98 - 111 mmol/L   CO2 24 22 - 32 mmol/L   Glucose, Bld 83 70 - 99 mg/dL   BUN <5 (L) 8 - 23 mg/dL   Creatinine, Ser 0.49 0.44 - 1.00 mg/dL   Calcium 8.0 (L) 8.9 - 10.3 mg/dL   GFR calc non Af Amer >60 >60 mL/min   GFR calc Af Amer >60 >60 mL/min   Anion gap 11 5 - 15    Comment: Performed at [facility], [place] [address] [place], [place] [address]  Prealbumin     Status: Abnormal   Collection Time: 03/18/19 11:51 AM  Result Value Ref Range   Prealbumin 6.5 (L) 18 -  38 mg/dL    Comment: Performed at [facility], [place] [address], [place], [place] [id number]  Basic metabolic panel     Status: Abnormal   Collection Time: 03/19/19  4:53 AM  Result Value Ref Range   Sodium 140 135 - 145 mmol/L   Potassium 2.6 (LL) 3.5 - 5.1 mmol/L    Comment: CRITICAL RESULT CALLED TO, READ BACK BY AND VERIFIED WITH: RN A LEMONS AT 0601 03/19/19 CRUICKSHANK A    Chloride 101 98 - 111 mmol/L   CO2 28 22 - 32 mmol/L   Glucose, Bld 81 70 - 99 mg/dL   BUN <5 (L) 8 - 23 mg/dL   Creatinine, Ser 0.53 0.44 - 1.00 mg/dL   Calcium 8.0 (L) 8.9 - 10.3 mg/dL   GFR calc non Af Amer >60 >60 mL/min   GFR calc Af Amer >60 >60 mL/min   Anion gap 11 5 - 15    Comment: Performed at [facility], [place] [address], [place], [place] [address]  CBC with Differential/Platelet     Status: Abnormal   Collection Time: 03/19/19  4:53 AM  Result Value Ref Range   WBC 5.7 4.0 - 10.5 K/uL   RBC 3.48 (L) 3.87 - 5.11 MIL/uL   Hemoglobin 10.9 (L) 12.0 - 15.0 g/dL   HCT 34.4 (L) 36.0 - 46.0 %   MCV 98.9 80.0  - 100.0 fL   MCH 31.3 26.0 - 34.0 pg   MCHC 31.7 30.0 - 36.0 g/dL   RDW 14.2 11.5 - 15.5 %   Platelets 175 150 - 400 K/uL   nRBC 0.0 0.0 - 0.2 %   Neutrophils Relative % 56 %   Neutro Abs 3.2 1.7 - 7.7 K/uL   Lymphocytes Relative 33 %   Lymphs Abs 1.9 0.7 - 4.0 K/uL   Monocytes Relative 9 %   Monocytes Absolute 0.5 0.1 - 1.0 K/uL   Eosinophils Relative 1 %   Eosinophils Absolute 0.1 0.0 - 0.5 K/uL   Basophils Relative 1 %   Basophils Absolute 0.1 0.0 - 0.1 K/uL   Immature Granulocytes 0 %   Abs Immature Granulocytes 0.01 0.00 - 0.07 K/uL    Comment: Performed at [facility], [place] [address] [place], [place] [address]  Magnesium     Status: None   Collection Time: 03/19/19  4:53 AM  Result Value Ref Range   Magnesium 2.0 1.7 - 2.4 mg/dL    Comment: Performed at [facility], [place] [address], [place], [place] [address]  CBC with Differential/Platelet     Status: Abnormal   Collection Time: 03/20/19  4:06 AM  Result Value Ref Range   WBC 6.1 4.0 - 10.5 K/uL   RBC 3.48 (L) 3.87 - 5.11 MIL/uL   Hemoglobin 10.9 (L) 12.0 - 15.0 g/dL   HCT 34.8 (L) 36.0 - 46.0 %   MCV 100.0 80.0 - 100.0 fL   MCH 31.3 26.0 - 34.0 pg   MCHC 31.3 30.0 - 36.0 g/dL   RDW 14.3 11.5 - 15.5 %   Platelets 173 150 - 400 K/uL   nRBC 0.0 0.0 - 0.2 %   Neutrophils Relative % 56 %   Neutro Abs 3.4 1.7 - 7.7 K/uL   Lymphocytes Relative 31 %   Lymphs Abs 1.9 0.7 - 4.0 K/uL   Monocytes Relative 10 %   Monocytes Absolute 0.6 0.1 - 1.0 K/uL   Eosinophils Relative 2 %  Eosinophils Absolute 0.1 0.0 - 0.5 K/uL   Basophils Relative 1 %   Basophils Absolute 0.0 0.0 - 0.1 K/uL   Immature Granulocytes 0 %   Abs Immature Granulocytes 0.02 0.00 - 0.07 K/uL    Comment: Performed at [facility], [place] [address] [place], [place] [id number]  Basic metabolic panel     Status: Abnormal   Collection Time: 03/20/19  4:06 AM  Result Value Ref Range   Sodium 136 135 -  145 mmol/L   Potassium 3.0 (L) 3.5 - 5.1 mmol/L   Chloride 101 98 - 111 mmol/L   CO2 24 22 - 32 mmol/L   Glucose, Bld 69 (L) 70 - 99 mg/dL   BUN <5 (L) 8 - 23 mg/dL   Creatinine, Ser 0.45 0.44 - 1.00 mg/dL   Calcium 8.0 (L) 8.9 - 10.3 mg/dL   GFR calc non Af Amer >60 >60 mL/min   GFR calc Af Amer >60 >60 mL/min   Anion gap 11 5 - 15    Comment: Performed at [facility], [place] [address], [place], [place] [address]  Magnesium     Status: None   Collection Time: 03/20/19  4:06 AM  Result Value Ref Range   Magnesium 1.9 1.7 - 2.4 mg/dL    Comment: Performed at [facility], [place] [address], [place], [place] [address]    Imaging / Studies: No results found.  Medications / Allergies: per chart  Antibiotics: Anti-infectives (From admission, onward)   Start     Dose/Rate Route Frequency Ordered Stop   03/18/19 1900  fluconazole (DIFLUCAN) IVPB 100 mg     100 mg 50 mL/hr over 60 Minutes Intravenous Every 24 hours 03/18/19 1753 03/25/19 1859   03/16/19 0600  piperacillin-tazobactam (ZOSYN) IVPB 3.375 g     3.375 g 12.5 mL/hr over 240 Minutes Intravenous Every 8 hours 03/15/19 2316     03/15/19 2315  piperacillin-tazobactam (ZOSYN) IVPB 3.375 g     3.375 g 100 mL/hr over 30 Minutes Intravenous NOW 03/15/19 2314 03/16/19 0038        Note: Portions of this report may have been transcribed using voice recognition software. Every effort was made to ensure accuracy; however, inadvertent computerized transcription errors may be present.   Any transcriptional errors that result from this process are unintentional.     [name], MD, FACS, MASCRS Gastrointestinal and Minimally Invasive Surgery    [address], [place], [place] [address] [phone] Main / Paging [phone] Fax

## 2019-03-20 NOTE — Progress Notes (Signed)
PROGRESS NOTE    Theresa Arnold  [id number] DOB: 04-Mar-1932 DOA: 03/15/2019 PCP: Biagio Borg, Theresa Arnold    Brief Narrative:  Per Dr. Dondra Prader I Charnock is a 85 y.o. female with medical history significant for COPD, hyperlipidemia, and chronic sigmoid diverticulitis with known abscess and stricture in the distal sigmoid colon who presented to the ED for evaluation of abdominal pain.  Patient follows with GI, Dr. Tarri Glenn, for known sigmoid diverticulitis and abscess.  Per GI documentation, case was discussed with IR and fluid collection was not considered amenable to IR drainage.  She underwent colonoscopy 01/27/2019 which was limited due to presence of stool.  Distal sigmoid stricture was seen.  She was reportedly referred to general surgery, [facility] Dr. Dema Severin, and has no present plans for surgical intervention.  Patient was seen in the ED on 03/11/2019 at which time a repeat CT abdomen/pelvis with contrast showed an apparent increase in proximal sigmoid colon wall thickening and unchanged 3.5 cm intramural sigmoid colonic wall abscess/collection which was present since 11/02/2018.  There was question of low-grade obstruction. She was given a prescription for ciprofloxacin and metronidazole.  Patient stated that she has had continued generalized abdominal pain.  She has not had oral intake due to the nausea after eating.  She has been unable to take her home medications including the recently prescribed antibiotics.  She stated that she has not had any bowel movements despite use of MiraLAX for several days.  She reported a 30 pound weight loss since last year.  She has not had any subjective fevers.  She reported good urine output without dysuria.  ED Course:  Initial vitals showed BP 110/71, pulse 82, RR 23, temp 97.6 Fahrenheit, SPO2 98% on room air  Labs notable for potassium 3.1, magnesium 2.6, BUN 11, creatinine 0.62, WBC 7.1, hemoglobin 13.8, platelets 200,000.  Abdominal x-ray showed  sub-pathologic gaseous distention of small bowel loops and colon which may represent obstruction at the distal colon or ileus.  Patient was given IV fluids, Protonix, Zofran, and the hospitalist service was consulted to admit for further evaluation and management.   Assessment & Plan:   Principal Problem:   Sigmoid stricture (HCC) Active Problems:   Diverticulitis of sigmoid colon   COPD (chronic obstructive pulmonary disease) (HCC)   Abnormal CT scan, gastrointestinal tract   Colonic partial obstruction from sigmoid stricture   Polyp of sigmoid colon   Oral thrush  1 chronic/recurrent sigmoid diverticulitis with known abscess and distal sigmoid stricture Patient with recurrent symptoms.  Patient noted to have some intermittent abdominal pain with weight loss felt secondary to low-grade diverticular stricture.  Patient is being followed by GI who had previously recommended surgery and patient was referred to general surgery.  Patient was advanced to a full liquid diet per general surgery on 03/17/2019 however patient unable to tolerate full liquid diet with some abdominal discomfort.  Patient on MiraLAX 17 g twice daily. General surgery feels patient will benefit from resection given recurrence of her symptoms, weight loss.  Patient initially seemed apprehensive however seems more agreeable to surgery now if needed.  Patient's diet was downgraded to n.p.o. per general surgery who are recommending sigmoid colectomy in the near future electively.  Per general surgery if no significant improvement may likely need surgery during this hospitalization.  Patient denies any recent chest pain or shortness of breath.  Patient denies any history of MI.  Patient able to tolerate 4 METS.  EKG with no ischemic changes  noted.  Chest x-ray done with no acute infiltrate.  Patient with sats of 100% on room air.  Continue Dulera and Spiriva to optimize pulmonary function as patient with history of COPD.  Duo nebs as  needed.  Patient hesitant to use inhalers. Patient with history of COPD however not O2 dependent.  Per Lyn[facility] Safe perioperative cardiac risk patient is an estimated risk of 0.32% for perioperative MI..  Continue IV fluids.  Supportive care.  Continue IV antibiotics.  Dulcolax suppository given to patient and MiraLAX dose increased to clean patient in anticipation of surgery per general surgery.  General surgery and GI following and appreciate input and recommendations.  2.  Hypokalemia Likely secondary to GI losses.  Potassium at 3.  Potassium packet 40 mEq every 4 hours x2 doses.  Magnesium at 1.9. Follow.   3.  COPD Stable.  Albuterol as needed.  4.  Oral thrush Continue nystatin swish and swallow.  Continue IV Diflucan.   DVT prophylaxis: SCDs Code Status: Full Family Communication: Updated patient.  No family at bedside. Disposition Plan Discharge when okay with GI and general surgery.   Consultants:   Gastroenterology: Dr. Tarri Glenn 03/16/2019  General surgery: Dr. Dema Severin 03/16/2019  Procedures:   Acute abdominal series 03/15/2019    Antimicrobials:   IV Zosyn 03/15/2019   Subjective: Patient laying in bed.  Patient states was given MiraLAX and a Dulcolax suppository however no bowel movement.  No nausea.  No bowel movement.  No flatus.  Still with abdominal pain.   Objective: Vitals:   03/19/19 0549 03/19/19 1501 03/19/19 2103 03/20/19 0617  BP: 138/68 (!) 156/86 (!) 147/60 136/71  Pulse: (!) 56 82 61 63  Resp: 16 14 16 18   Temp: 98.2 F (36.8 C) 97.9 F (36.6 C) 98.9 F (37.2 C) 98.1 F (36.7 C)  TempSrc: Oral Oral Oral Oral  SpO2: 98% 99% 97% 97%  Weight: 50.5 kg   45.9 kg  Height:        Intake/Output Summary (Last 24 hours) at 03/20/2019 1231 Last data filed at 03/20/2019 0921 Gross per 24 hour  Intake 698.63 ml  Output 2500 ml  Net -1801.37 ml   Filed Weights   03/18/19 0456 03/19/19 0549 03/20/19 0617  Weight: 52 kg 50.5 kg 45.9 kg    Examination:  General  exam: NAD Respiratory system: CTAB.  No wheezes, no crackles, no rhonchi.  Normal respiratory effort. Cardiovascular system: RRR no murmurs rubs or gallops.  No JVD.  No lower extremity edema.  Gastrointestinal system: Abdomen is mildly distended, soft, positive bowel sounds, some diffuse tenderness to palpation.  No rebound.  No guarding.  Central nervous system: Alert and oriented. No focal neurological deficits. Extremities: Symmetric 5 x 5 power. Skin: No rashes, lesions or ulcers Psychiatry: Judgement and insight appear normal. Mood & affect appropriate.     Data Reviewed: I have personally reviewed following labs and imaging studies  CBC: Recent Labs  Lab 03/16/19 0530 03/17/19 0333 03/18/19 0312 03/19/19 0453 03/20/19 0406  WBC 6.3 4.8 4.6 5.7 6.1  NEUTROABS  --  2.4  --  3.2 3.4  HGB 11.0* 10.1* 10.1* 10.9* 10.9*  HCT 35.2* 31.7* 31.6* 34.4* 34.8*  MCV 101.1* 99.7 99.1 98.9 100.0  PLT 168 156 168 175 [id number]   Basic Metabolic Panel: Recent Labs  Lab 03/15/19 1940  03/17/19 0333 03/18/19 0312 03/18/19 1151 03/19/19 0453 03/20/19 0406  NA 138   < > 139 139 140 140 136  K 3.1*   < >  3.0* 2.6* 2.9* 2.6* 3.0*  CL 99   < > 109 109 105 101 101  CO2 25   < > 25 25 24 28 24   GLUCOSE 86   < > 130* 107* 83 81 69*  BUN 11   < > <5* <5* <5* <5* <5*  CREATININE 0.62   < > 0.47 0.42* 0.49 0.53 0.45  CALCIUM 8.8*   < > 7.7* 7.7* 8.0* 8.0* 8.0*  MG 2.6*  --   --  1.8  --  2.0 1.9   < > = values in this interval not displayed.   GFR: Estimated Creatinine Clearance: 37.9 mL/min (by C-G formula based on SCr of 0.45 mg/dL). Liver Function Tests: Recent Labs  Lab 03/15/19 1940  AST 16  ALT 7  ALKPHOS 48  BILITOT 1.1  PROT 7.0  ALBUMIN 3.4*   No results for input(s): LIPASE, AMYLASE in the last 168 hours. No results for input(s): AMMONIA in the last 168 hours. Coagulation Profile: No results for input(s): INR, PROTIME in the last 168 hours. Cardiac Enzymes: No results  for input(s): CKTOTAL, CKMB, CKMBINDEX, TROPONINI in the last 168 hours. BNP (last 3 results) No results for input(s): PROBNP in the last 8760 hours. HbA1C: No results for input(s): HGBA1C in the last 72 hours. CBG: Recent Labs  Lab 03/17/19 1613  GLUCAP 100*   Lipid Profile: No results for input(s): CHOL, HDL, LDLCALC, TRIG, CHOLHDL, LDLDIRECT in the last 72 hours. Thyroid Function Tests: No results for input(s): TSH, T4TOTAL, FREET4, T3FREE, THYROIDAB in the last 72 hours. Anemia Panel: No results for input(s): VITAMINB12, FOLATE, FERRITIN, TIBC, IRON, RETICCTPCT in the last 72 hours. Sepsis Labs: Recent Labs  Lab 03/15/19 1940  LATICACIDVEN 1.5    Recent Results (from the past 240 hour(s))  SARS CORONAVIRUS 2 (TAT 6-24 HRS) Nasopharyngeal Nasopharyngeal Swab     Status: None   Collection Time: 03/15/19 10:22 PM   Specimen: Nasopharyngeal Swab  Result Value Ref Range Status   SARS Coronavirus 2 NEGATIVE NEGATIVE Final    Comment: (NOTE) SARS-CoV-2 target nucleic acids are NOT DETECTED. The SARS-CoV-2 RNA is generally detectable in upper and lower respiratory specimens during the acute phase of infection. Negative results do not preclude SARS-CoV-2 infection, do not rule out co-infections with other pathogens, and should not be used as the sole basis for treatment or other patient management decisions. Negative results must be combined with clinical observations, patient history, and epidemiological information. The expected result is Negative. Fact Sheet for Patients: SugarRoll.be Fact Sheet for Healthcare Providers: https://www.woods-mathews.com/ This test is not yet approved or cleared by the [place] FDA and  has been authorized for detection and/or diagnosis of SARS-CoV-2 by FDA under an Emergency Use Authorization (EUA). This EUA will remain  in effect (meaning this test can be used) for the duration of the COVID-19  declaration under Section 56 4(b)(1) of the Act, 21 U.S.C. section 360bbb-3(b)(1), unless the authorization is terminated or revoked sooner. Performed at [facility], [place] [address] [place], [place] [address]          Radiology Studies: Dg Abd Acute 2+v W 1v Chest  Result Date: 03/20/2019 CLINICAL DATA:  Abdominal pain in a patient with a history of chronic sigmoid diverticulitis and a small abscess on CT scan 03/11/2019. EXAM: DG ABDOMEN ACUTE W/ 1V CHEST COMPARISON:  CT abdomen and pelvis 03/11/2019. Chest and two views abdomen 03/15/2019 and two views of the abdomen 03/18/2019. FINDINGS: Single-view of  the chest demonstrates clear lungs and normal heart size. Trace pleural effusions. No pneumothorax. Atherosclerosis noted. Two views of the abdomen show no free intraperitoneal air. Multiple gas-filled but nondilated loops of small and large bowel are present. No abnormal abdominal calcification or focal bony abnormality. IMPRESSION: Trace bilateral pleural effusions. Multiple gas-filled but nondilated loops of small and large bowel may be due to ileus. Electronically Signed   By: Inge Rise M.D.   On: 03/20/2019 11:15        Scheduled Meds:  bisacodyl  10 mg Rectal Daily   lip balm  1 application Topical BID   mometasone-formoterol  2 puff Inhalation BID   nystatin  5 mL Oral QID   polyethylene glycol  34 g Oral BID   potassium chloride  40 mEq Oral Q4H   sodium chloride flush  3 mL Intravenous Q12H   umeclidinium bromide  1 puff Inhalation Daily   Continuous Infusions:  sodium chloride Stopped (03/19/19 0507)   fluconazole (DIFLUCAN) IV 100 mg (03/19/19 2004)   piperacillin-tazobactam (ZOSYN)  IV 3.375 g (03/20/19 0556)     LOS: 5 days    Time spent: 35 minutes    Theresa Seal, Theresa Arnold [facility]  If 7PM-7AM, please contact night-coverage [email or web address] 03/20/2019, 12:31 PM

## 2019-03-20 NOTE — Progress Notes (Signed)
     [facility] Progress Note  CC:  Diverticulitis, diverticular sigmoid stricture  Subjective:  Feels ok.  Had a suppository about 30 mins ago but no BM from that yet.  Abdominal x-rays this AM show multiple gas-filled but non-dilated loops of small and large bowel.  Objective:  Vital signs in last 24 hours: Temp:  [97.9 F (36.6 C)-98.9 F (37.2 C)] 98.1 F (36.7 C) (09/07 0617) Pulse Rate:  [61-82] 63 (09/07 0617) Resp:  [14-18] 18 (09/07 0617) BP: (136-156)/(60-86) 136/71 (09/07 0617) SpO2:  [97 %-99 %] 97 % (09/07 0617) Weight:  [45.9 kg] 45.9 kg (09/07 0617) Last BM Date: 03/19/19 General:  Alert, Well-developed, in NAD Heart:  Regular rate and rhythm; no murmurs Pulm:  CTAB.  No increased WOB. Abdomen:  Soft, still distended.  BS present.  Mildly tender diffusely. Extremities:  Without edema. Neurologic:  Alert and oriented x 4;  grossly normal neurologically. Psych:  Alert and cooperative. Normal mood and affect.  Intake/Output from previous day: 09/06 0701 - 09/07 0700 In: 615.8 [I.V.:162; IV Piggyback:453.8] Out: 2750 [Urine:2750] Intake/Output this shift: Total I/O In: 240 [P.O.:240] Out: 300 [Urine:300]  Lab Results: Recent Labs    03/18/19 0312 03/19/19 0453 03/20/19 0406  WBC 4.6 5.7 6.1  HGB 10.1* 10.9* 10.9*  HCT 31.6* 34.4* 34.8*  PLT 168 175 173   BMET Recent Labs    03/18/19 1151 03/19/19 0453 03/20/19 0406  NA 140 140 136  K 2.9* 2.6* 3.0*  CL 105 101 101  CO2 24 28 24   GLUCOSE 83 81 69*  BUN <5* <5* <5*  CREATININE 0.49 0.53 0.45  CALCIUM 8.0* 8.0* 8.0*   Dg Abd Acute 2+v W 1v Chest  Result Date: 03/20/2019 CLINICAL DATA:  Abdominal pain in a patient with a history of chronic sigmoid diverticulitis and a small abscess on CT scan 03/11/2019. EXAM: DG ABDOMEN ACUTE W/ 1V CHEST COMPARISON:  CT abdomen and pelvis 03/11/2019. Chest and two views abdomen 03/15/2019 and two views of the abdomen 03/18/2019. FINDINGS:  Single-view of the chest demonstrates clear lungs and normal heart size. Trace pleural effusions. No pneumothorax. Atherosclerosis noted. Two views of the abdomen show no free intraperitoneal air. Multiple gas-filled but nondilated loops of small and large bowel are present. No abnormal abdominal calcification or focal bony abnormality. IMPRESSION: Trace bilateral pleural effusions. Multiple gas-filled but nondilated loops of small and large bowel may be due to ileus. Electronically Signed   By: Inge Rise M.D.   On: 03/20/2019 11:15   Assessment / Plan: *85 year old female with complaints of abdominal pain and constipation, recurrent diverticulitis with fluid collection and sigmoid stricture causing partial colonic obstruction:CT scan with possible acute flare on admission.  Slow to resolve. *Hypokalemia: 3.0 this AM. Replacement per primary service. Ideal to keep it at 4.0 or above for optimal gut function.  -Surgery seeing the patient here as well and we appreciate their input.  Possible surgery early this week.  They are trying to clean her out with hope of one step surgery. -Agree with IV abx and conservative measures.   LOS: 5 days   Theresa Arnold  03/20/2019, 11:28 AM

## 2019-03-20 NOTE — Progress Notes (Signed)
PT Cancellation Note  Patient Details Name: Theresa Arnold MRN: [id number] DOB: 06/15/1932   Cancelled Treatment:    Reason Eval/Treat Not Completed: Patient declined, no reason specified attempted to work with patient this afternoon, she declines PT due to feeling sick to her stomach and not having had a BM. Educated regarding role of exercise and activity in promoting bowel movements, however patient continues to refuse. She was left in bed with all needs met and husband present.    [name] PT, DPT, CBIS  Supplemental Physical Therapist [facility]    Pager [phone] Acute Rehab Office [phone]

## 2019-03-21 ENCOUNTER — Inpatient Hospital Stay: Payer: Self-pay

## 2019-03-21 ENCOUNTER — Inpatient Hospital Stay (HOSPITAL_COMMUNITY): Payer: Medicare Other

## 2019-03-21 LAB — BASIC METABOLIC PANEL
Anion gap: 12 (ref 5–15)
BUN: 6 mg/dL — ABNORMAL LOW (ref 8–23)
CO2: 22 mmol/L (ref 22–32)
Calcium: 8.6 mg/dL — ABNORMAL LOW (ref 8.9–10.3)
Chloride: 101 mmol/L (ref 98–111)
Creatinine, Ser: 0.5 mg/dL (ref 0.44–1.00)
GFR calc Af Amer: >60 mL/min (ref >60)
GFR calc non Af Amer: >60 mL/min (ref >60)
Glucose, Bld: 86 mg/dL (ref 70–99)
Potassium: 4.4 mmol/L (ref 3.5–5.1)
Sodium: 135 mmol/L (ref 135–145)

## 2019-03-21 LAB — PHOSPHORUS: Phosphorus: 3.8 mg/dL (ref 2.5–4.6)

## 2019-03-21 LAB — CBC WITH DIFFERENTIAL/PLATELET
Abs Immature Granulocytes: 0.02 10*3/uL (ref 0.00–0.07)
Basophils Absolute: 0 10*3/uL (ref 0.0–0.1)
Basophils Relative: 0 %
Eosinophils Absolute: 0 10*3/uL (ref 0.0–0.5)
Eosinophils Relative: 1 %
HCT: 41.8 % (ref 36.0–46.0)
Hemoglobin: 13.6 g/dL (ref 12.0–15.0)
Immature Granulocytes: 0 %
Lymphocytes Relative: 25 %
Lymphs Abs: 1.5 10*3/uL (ref 0.7–4.0)
MCH: 31.8 pg (ref 26.0–34.0)
MCHC: 32.5 g/dL (ref 30.0–36.0)
MCV: 97.7 fL (ref 80.0–100.0)
Monocytes Absolute: 0.6 10*3/uL (ref 0.1–1.0)
Monocytes Relative: 10 %
Neutro Abs: 3.8 10*3/uL (ref 1.7–7.7)
Neutrophils Relative %: 64 %
Platelets: 221 10*3/uL (ref 150–400)
RBC: 4.28 MIL/uL (ref 3.87–5.11)
RDW: 14.5 % (ref 11.5–15.5)
WBC: 5.9 10*3/uL (ref 4.0–10.5)
nRBC: 0 % (ref 0.0–0.2)

## 2019-03-21 LAB — MAGNESIUM: Magnesium: 2.2 mg/dL (ref 1.7–2.4)

## 2019-03-21 LAB — GLUCOSE, CAPILLARY: Glucose-Capillary: 85 mg/dL (ref 70–99)

## 2019-03-21 MED ORDER — SODIUM CHLORIDE 0.9% FLUSH
10.0000 mL | INTRAVENOUS | Status: DC | PRN
  Administered 2019-03-24 – 2019-03-31 (×4): 10 mL
  Filled 2019-03-21 (×4): qty 40

## 2019-03-21 MED ORDER — SODIUM CHLORIDE 0.9% FLUSH
10.0000 mL | Freq: Two times a day (BID) | INTRAVENOUS | Status: DC
  Administered 2019-03-29: 23:00:00 20 mL

## 2019-03-21 MED ORDER — CHLORHEXIDINE GLUCONATE CLOTH 2 % EX PADS
6.0000 | MEDICATED_PAD | Freq: Once | CUTANEOUS | Status: AC
  Administered 2019-03-22: 07:00:00 6 via TOPICAL

## 2019-03-21 MED ORDER — TRAVASOL 10 % IV SOLN
INTRAVENOUS | Status: AC
  Administered 2019-03-21: 22:00:00 via INTRAVENOUS
  Filled 2019-03-21: qty 302.4

## 2019-03-21 MED ORDER — ENOXAPARIN SODIUM 30 MG/0.3ML ~~LOC~~ SOLN
30.0000 mg | SUBCUTANEOUS | Status: DC
  Administered 2019-03-21 – 2019-04-02 (×12): 30 mg via SUBCUTANEOUS
  Filled 2019-03-21 (×12): qty 0.3

## 2019-03-21 MED ORDER — CHLORHEXIDINE GLUCONATE CLOTH 2 % EX PADS
6.0000 | MEDICATED_PAD | Freq: Once | CUTANEOUS | Status: AC
  Administered 2019-03-21: 23:00:00 6 via TOPICAL

## 2019-03-21 MED ORDER — INSULIN ASPART 100 UNIT/ML ~~LOC~~ SOLN
0.0000 [IU] | Freq: Four times a day (QID) | SUBCUTANEOUS | Status: DC
  Administered 2019-03-22 – 2019-03-23 (×3): 3 [IU] via SUBCUTANEOUS

## 2019-03-21 MED ORDER — SODIUM CHLORIDE 0.9 % IV SOLN
2.0000 g | INTRAVENOUS | Status: AC
  Administered 2019-03-22: 13:00:00 2 g via INTRAVENOUS
  Filled 2019-03-21: qty 2

## 2019-03-21 NOTE — H&P (View-Only) (Signed)
[facility] Progress Note      Assessment/Plan Principal Problem:   Sigmoid stricture (HCC) Active Problems:   COPD (chronic obstructive pulmonary disease) (HCC)   Diverticulitis of sigmoid colon   Abnormal CT scan, gastrointestinal tract   Colonic partial obstruction from sigmoid stricture   Polyp of sigmoid colon   Oral thrush  Partial colonic obstruction from sigmoid stricture presumed due to chronic recurrent diverticulitis - Abdominal x-ray yesterday so showed gas in large and small bowel suggesting ileus - small BM yesterday but no flatus since.  - Nausea and vomiting overnight - Discussed with the nurse, NG tube if patient vomits again - Abdominal x-ray pending this a.m. - Patient will likely need surgery if she continues with an obstructive pattern Severe malnutrition - Prealbumin 6.5 on 09/05 - will start TPN  FEN: NPO, TPN VTE: SCD's, lovenox ID: [email or web address] 09/02>> Foley: None Follow up: TBD  DISPO: Nausea vomiting overnight, TPN, patient may require surgery as early as tomorrow    LOS: 6 days    Subjective: CC: Abdominal pain, nausea vomiting  Patient states yesterday she had 1 or 2 small BMs with flatus but no flatus or BM since.  She feels bloated.  She has intermittent severe abdominal pains.  She has nausea with 1 episode of emesis overnight.  She is amenable to surgery.  Objective: Vital signs in last 24 hours: Temp:  [98.2 F (36.8 C)-98.8 F (37.1 C)] 98.8 F (37.1 C) (09/08 0620) Pulse Rate:  [69-89] 89 (09/08 0620) Resp:  [16-18] 18 (09/08 0620) BP: (131-150)/(72-76) 150/72 (09/08 0620) SpO2:  [97 %-100 %] 98 % (09/08 0620) Weight:  [46.5 kg] 46.5 kg (09/08 0500) Last BM Date: 03/20/19  Intake/Output from previous day: 09/07 0701 - 09/08 0700 In: 1496.6 [P.O.:1140; I.V.:137.2; IV Piggyback:219.4] Out: 1700 [Urine:1700] Intake/Output this shift: No intake/output data recorded.  PE: Gen:  Alert, NAD, pleasant,  cooperative Pulm: Rate and effort normal Abd: Soft, distended, hypoactive bowel sounds, generalized TTP with mild guarding, no peritonitis Skin: no rashes noted, warm and dry   Anti-infectives: Anti-infectives (From admission, onward)   Start     Dose/Rate Route Frequency Ordered Stop   03/18/19 1900  fluconazole (DIFLUCAN) IVPB 100 mg     100 mg 50 mL/hr over 60 Minutes Intravenous Every 24 hours 03/18/19 1753 03/25/19 1859   03/16/19 0600  piperacillin-tazobactam (ZOSYN) IVPB 3.375 g     3.375 g 12.5 mL/hr over 240 Minutes Intravenous Every 8 hours 03/15/19 2316     03/15/19 2315  piperacillin-tazobactam (ZOSYN) IVPB 3.375 g     3.375 g 100 mL/hr over 30 Minutes Intravenous NOW 03/15/19 2314 03/16/19 0038      Lab Results:  Recent Labs    03/20/19 0406 03/21/19 0258  WBC 6.1 5.9  HGB 10.9* 13.6  HCT 34.8* 41.8  PLT 173 221   BMET Recent Labs    03/20/19 0406 03/21/19 0258  NA 136 135  K 3.0* 4.4  CL 101 101  CO2 24 22  GLUCOSE 69* 86  BUN <5* 6*  CREATININE 0.45 0.50  CALCIUM 8.0* 8.6*   PT/INR No results for input(s): LABPROT, INR in the last 72 hours. CMP     Component Value Date/Time   NA 135 03/21/2019 0258   K 4.4 03/21/2019 0258   CL 101 03/21/2019 0258   CO2 22 03/21/2019 0258   GLUCOSE 86 03/21/2019 0258   BUN 6 (L) 03/21/2019 0258   CREATININE 0.50 03/21/2019 0258  CALCIUM 8.6 (L) 03/21/2019 0258   PROT 7.0 03/15/2019 1940   ALBUMIN 3.4 (L) 03/15/2019 1940   AST 16 03/15/2019 1940   ALT 7 03/15/2019 1940   ALKPHOS 48 03/15/2019 1940   BILITOT 1.1 03/15/2019 1940   GFRNONAA >60 03/21/2019 0258   GFRAA >60 03/21/2019 0258   Lipase     Component Value Date/Time   LIPASE 11.0 11/02/2018 1103    Studies/Results: Dg Abd Acute 2+v W 1v Chest  Result Date: 03/20/2019 CLINICAL DATA:  Abdominal pain in a patient with a history of chronic sigmoid diverticulitis and a small abscess on CT scan 03/11/2019. EXAM: DG ABDOMEN ACUTE W/ 1V CHEST  COMPARISON:  CT abdomen and pelvis 03/11/2019. Chest and two views abdomen 03/15/2019 and two views of the abdomen 03/18/2019. FINDINGS: Single-view of the chest demonstrates clear lungs and normal heart size. Trace pleural effusions. No pneumothorax. Atherosclerosis noted. Two views of the abdomen show no free intraperitoneal air. Multiple gas-filled but nondilated loops of small and large bowel are present. No abnormal abdominal calcification or focal bony abnormality. IMPRESSION: Trace bilateral pleural effusions. Multiple gas-filled but nondilated loops of small and large bowel may be due to ileus. Electronically Signed   By: Inge Rise M.D.   On: 03/20/2019 11:15      Kalman Drape , [facility] 03/21/2019, 9:15 AM  Pager: [phone] Mon-Wed, Friday 7:00am-4:30pm Thurs 7am-11:30am  Consults: [phone]

## 2019-03-21 NOTE — Progress Notes (Signed)
Physical Therapy Treatment Patient Details Name: Theresa Arnold MRN: [id number] DOB: 06/16/1932 Today's Date: 03/21/2019    History of Present Illness 85yo female who presented to the ED with abdominal pain. Admitted for chronic/recurrent sigmoid diverticulitis with known abscess and distal sigmoid stricture. PMH cervical radiculopathy and spondylosis, COPD, chronic diverticulitis with abscess    PT Comments    Pt ambulated 140' with single hand held assist and holding IV pole. Ambulation distance limited by fatigue and 8/10 abdominal pain. Pt is pleasant and puts forth good effort.   Follow Up Recommendations  Home health PT     Equipment Recommendations  Rolling walker with 5" wheels    Recommendations for Other Services       Precautions / Restrictions Precautions Precautions: Fall Restrictions Weight Bearing Restrictions: No    Mobility  Bed Mobility Overal bed mobility: Modified Independent             General bed mobility comments: HOB up, pt assists RLE into bed with her UEs  Transfers Overall transfer level: Needs assistance Equipment used: None Transfers: Sit to/from Stand Sit to Stand: Supervision         General transfer comment: supervision for safety  Ambulation/Gait Ambulation/Gait assistance: Min assist Gait Distance (Feet): 140 Feet Assistive device: IV Pole;1 person hand held assist Gait Pattern/deviations: Step-through pattern;Decreased stride length Gait velocity: decr   General Gait Details: steady with BUE support on IV pole and 1 HHA, pt declined trial of RW, distance limited by abdominal pain and fatigue   Stairs             Wheelchair Mobility    Modified Rankin (Stroke Patients Only)       Balance Overall balance assessment: Needs assistance Sitting-balance support: Feet supported;No upper extremity supported Sitting balance-Leahy Scale: Normal     Standing balance support: Bilateral upper extremity  supported;During functional activity Standing balance-Leahy Scale: Fair Standing balance comment: intermittent single extremity support especially for dynamic tasks                            Cognition Arousal/Alertness: Awake/alert Behavior During Therapy: WFL for tasks assessed/performed Overall Cognitive Status: Within Functional Limits for tasks assessed                                        Exercises      General Comments        Pertinent Vitals/Pain Pain Score: 8  Pain Location: abdomen Pain Descriptors / Indicators: Aching Pain Intervention(s): Limited activity within patient's tolerance;Monitored during session;Premedicated before session    Home Living                      Prior Function            PT Goals (current goals can now be found in the care plan section) Acute Rehab PT Goals Patient Stated Goal: go home, have less pain; maintain independence, walk around farther PT Goal Formulation: With patient Time For Goal Achievement: 03/31/19 Potential to Achieve Goals: Good Progress towards PT goals: Progressing toward goals    Frequency    Min 3X/week      PT Plan Current plan remains appropriate    Co-evaluation              AM-PAC PT "6 Clicks" Mobility   Outcome  Measure  Help needed turning from your back to your side while in a flat bed without using bedrails?: A Little Help needed moving from lying on your back to sitting on the side of a flat bed without using bedrails?: A Little Help needed moving to and from a bed to a chair (including a wheelchair)?: A Little Help needed standing up from a chair using your arms (e.g., wheelchair or bedside chair)?: A Little Help needed to walk in hospital room?: A Little Help needed climbing 3-5 steps with a railing? : A Little 6 Click Score: 18    End of Session Equipment Utilized During Treatment: Gait belt Activity Tolerance: Patient tolerated treatment  well;Patient limited by pain Patient left: with call bell/phone within reach;in bed Nurse Communication: Mobility status PT Visit Diagnosis: Unsteadiness on feet (R26.81);Muscle weakness (generalized) (M62.81)     Time: [id number] PT Time Calculation (min) (ACUTE ONLY): 11 min  Charges:  $Gait Training: 8-22 mins                    Blondell Reveal Kistler PT 03/21/2019  Acute Rehabilitation Services Pager [phone] Office [phone]

## 2019-03-21 NOTE — Progress Notes (Signed)
ANTICOAGULATION CONSULT NOTE   Pharmacy Consult for lovenox Indication: VTE prophylaxis  No Known Allergies  Patient Measurements: Height: 5\' 2"  (157.5 cm) Weight: 104 lb 8 oz (47.4 kg) IBW/kg (Calculated) : 50.1 Heparin Dosing Weight:   Vital Signs: Temp: 98.8 F (37.1 C) (09/08 0620) Temp Source: Oral (09/08 0620) BP: 150/72 (09/08 0620) Pulse Rate: 89 (09/08 0620)  Labs: Recent Labs    03/19/19 0453 03/20/19 0406 03/21/19 0258  HGB 10.9* 10.9* 13.6  HCT 34.4* 34.8* 41.8  PLT 175 173 221  CREATININE 0.53 0.45 0.50    Estimated Creatinine Clearance: 39.2 mL/min (by C-G formula based on SCr of 0.5 mg/dL).   Assessment: Patient's an 85 y.o F known to pharmacy from TPN consult. Plan for possible colectomy this week. Pharmacy was consulted to dose lovenox for VTE prophylaxis.    Plan:  - lovenox 30 mg SQ daily (using lower dose d/t low weight - 47 kg) - please advise if/when lovenox needs to be held if to proceed with surgical procedure.  Petina Muraski P 03/21/2019,11:10 AM

## 2019-03-21 NOTE — Progress Notes (Signed)
Peripherally Inserted Central Catheter/Midline Placement  The IV Nurse has discussed with the patient and/or persons authorized to consent for the patient, the purpose of this procedure and the potential benefits and risks involved with this procedure.  The benefits include less needle sticks, lab draws from the catheter, and the patient may be discharged home with the catheter. Risks include, but not limited to, infection, bleeding, blood clot (thrombus formation), and puncture of an artery; nerve damage and irregular heartbeat and possibility to perform a PICC exchange if needed/ordered by physician.  Alternatives to this procedure were also discussed.  Bard Power PICC patient education guide, fact sheet on infection prevention and patient information card has been provided to patient /or left at bedside.    PICC/Midline Placement Documentation  PICC Double Lumen 03/21/19 PICC Right Brachial 37 cm 0 cm (Active)  Indication for Insertion or Continuance of Line Administration of hyperosmolar/irritating solutions (i.e. TPN, Vancomycin, etc.) 03/21/19 2116  Exposed Catheter (cm) 0 cm 03/21/19 2116  Site Assessment Clean;Intact;Dry 03/21/19 2116  Lumen #1 Status Flushed;Saline locked;Blood return noted 03/21/19 2116  Lumen #2 Status Flushed;Saline locked;Blood return noted 03/21/19 2116  Dressing Type Transparent 03/21/19 2116  Dressing Status Clean;Dry;Intact;Antimicrobial disc in place 03/21/19 2116  Dressing Change Due 03/28/19 03/21/19 2116       Gordan Payment 03/21/2019, 9:17 PM

## 2019-03-21 NOTE — Progress Notes (Signed)
PHARMACY - ADULT TOTAL PARENTERAL NUTRITION CONSULT NOTE   Pharmacy Consult for TPN Indication: ileus, malnutrition- intolerance to enteral feeding  Patient Measurements: Height: 5\' 2"  (157.5 cm) Weight: 102 lb 8.2 oz (46.5 kg) IBW/kg (Calculated) : 50.1 TPN AdjBW (KG): 47 Body mass index is 18.75 kg/m. Usual Weight:   Insulin Requirements: not on insulin  Current Nutrition: none  IVF: none  Central access: pending PICC placement TPN start date: 03/21/19 - pending PICC placement  ASSESSMENT                                                                                                          HPI: Patient is an 85 y.o F with hx recurrent diverticulitis presented to the ED on 03/15/19 with c/o abd pain.  Abd x-ray on 9/2 showed findings with suspicion for obstruction at distal colon or ileus. CCS recom possible colectomy this week (9/8-9/11).  Pharmacy is consulted to start TPN on 9/8 for ileus and malnutrition.  Significant events:  9/3: clear liquids 9/4: changed to full liquids  Today:   Glucose: 86 with bmp  Electrolytes: K 4.4, 3.8, mag 2.2; CL 101, CO2 22  Renal: scr ok  LFTs: wnl (9/2)  [id number]  Prealbumin: 6.5 (9/5)  NUTRITIONAL GOALS                                                                                             RD recs: pending  Custom TPN at goal rate of  60 ml/hr provides: - 60  g/day protein ( 42  g/L) -  35 g/day Lipid  ( 24  g/L) -  190  g/day Dextrose ( 13 %) -  1223 Kcal/day  PLAN                                                                                                                          If able to place PICC, At 1800 today:  Start TPN at 30 ml/hr. -  Provides 30 g of protein, 94 g of dextrose, and 17 g of lipids which provides 612 kCals per day, meeting 50 % of goal kcal and goal AA  Electrolytes  in TPN: Standard, Cl:Ac ratio 1:1  Plan to advance as tolerated to the goal rate.  TPN to contain standard  multivitamins and trace elements.  Add sensitive SSI q6h  TPN lab panels on Mondays & Thursdays.  F/u daily.  Dia Sitter P 03/21/2019,10:04 AM

## 2019-03-21 NOTE — Progress Notes (Signed)
PROGRESS NOTE    Theresa Arnold  [id number] DOB: 1932/04/07 DOA: 03/15/2019 PCP: Biagio Borg, MD    Brief Narrative:  Per Dr. Dondra Prader I Theresa Arnold is a 85 y.o. female with medical history significant for COPD, hyperlipidemia, and chronic sigmoid diverticulitis with known abscess and stricture in the distal sigmoid colon who presented to the ED for evaluation of abdominal pain.  Patient follows with GI, Dr. Tarri Glenn, for known sigmoid diverticulitis and abscess.  Per GI documentation, case was discussed with IR and fluid collection was not considered amenable to IR drainage.  She underwent colonoscopy 01/27/2019 which was limited due to presence of stool.  Distal sigmoid stricture was seen.  She was reportedly referred to general surgery, [facility] Dr. Dema Severin, and has no present plans for surgical intervention.  Patient was seen in the ED on 03/11/2019 at which time a repeat CT abdomen/pelvis with contrast showed an apparent increase in proximal sigmoid colon wall thickening and unchanged 3.5 cm intramural sigmoid colonic wall abscess/collection which was present since 11/02/2018.  There was question of low-grade obstruction. She was given a prescription for ciprofloxacin and metronidazole.  Patient stated that she has had continued generalized abdominal pain.  She has not had oral intake due to the nausea after eating.  She has been unable to take her home medications including the recently prescribed antibiotics.  She stated that she has not had any bowel movements despite use of MiraLAX for several days.  She reported a 30 pound weight loss since last year.  She has not had any subjective fevers.  She reported good urine output without dysuria.  ED Course:  Initial vitals showed BP 110/71, pulse 82, RR 23, temp 97.6 Fahrenheit, SPO2 98% on room air  Labs notable for potassium 3.1, magnesium 2.6, BUN 11, creatinine 0.62, WBC 7.1, hemoglobin 13.8, platelets 200,000.  Abdominal x-ray showed  sub-pathologic gaseous distention of small bowel loops and colon which may represent obstruction at the distal colon or ileus.  Patient was given IV fluids, Protonix, Zofran, and the hospitalist service was consulted to admit for further evaluation and management.   Assessment & Plan:   Principal Problem:   Sigmoid stricture (HCC) Active Problems:   Diverticulitis of sigmoid colon   COPD (chronic obstructive pulmonary disease) (HCC)   Abnormal CT scan, gastrointestinal tract   Colonic partial obstruction from sigmoid stricture   Polyp of sigmoid colon   Oral thrush  1 chronic/recurrent sigmoid diverticulitis with known abscess and distal sigmoid stricture Patient with recurrent symptoms.  Patient noted to have some intermittent abdominal pain with weight loss felt secondary to low-grade diverticular stricture.  Patient is being followed by GI who had previously recommended surgery and patient was referred to general surgery.  Patient was advanced to a full liquid diet per general surgery on 03/17/2019 however patient unable to tolerate full liquid diet with some abdominal discomfort.  Patient on MiraLAX 17 g twice daily. General surgery feels patient will benefit from resection given recurrence of her symptoms, weight loss.  Patient initially seemed apprehensive however seems more agreeable to surgery now if needed.  Patient's diet was downgraded to n.p.o. per general surgery who are recommending sigmoid colectomy in the near future electively.  Per general surgery if no significant improvement may likely need surgery during this hospitalization.  Patient denies any recent chest pain or shortness of breath.  Patient denies any history of MI.  Patient able to tolerate 4 METS.  EKG with no ischemic changes  noted.  Chest x-ray done with no acute infiltrate.  Patient with sats of 100% on room air.  Continue Dulera and Spiriva to optimize pulmonary function as patient with history of COPD.  Duo nebs as  needed.  Patient hesitant to use inhalers. Patient with history of COPD however not O2 dependent.  Per Lyn[facility] Safe perioperative cardiac risk patient is an estimated risk of 0.32% for perioperative MI..  Continue IV fluids.  Supportive care.  Continue IV antibiotics.  Dulcolax suppository given to patient and MiraLAX dose increased to clean patient in anticipation of surgery per general surgery.  Patient however with no bowel movements.  To be started on TPN per general surgery as patient unable to tolerate oral intake at this time and patient with severe malnutrition.  General surgery and GI following and appreciate input and recommendations.  2.  Hypokalemia Likely secondary to GI losses.  Potassium at 4.4.  Magnesium at 2.2.  3.  COPD Stable.  Albuterol as needed.  4.  Oral thrush Continue nystatin swish and swallow.  Continue IV Diflucan.  5.  Severe malnutrition Prealbumin noted to be 6.5 on 03/18/2019.  Patient to be started on TPN per general surgery.   DVT prophylaxis: SCDs Code Status: Full Family Communication: Updated patient.  No family at bedside. Disposition Plan Discharge when okay with GI and general surgery.   Consultants:   Gastroenterology: Dr. Tarri Glenn 03/16/2019  General surgery: Dr. Dema Severin 03/16/2019  Procedures:   Acute abdominal series 03/15/2019    Antimicrobials:   IV Zosyn 03/15/2019   Subjective: Patient laying in bed.  Patient states no bowel movement or flatus this morning.  Denies any chest pain or shortness of breath.  Patient noted to have some nausea and emesis overnight.  Objective: Vitals:   03/20/19 2112 03/21/19 0500 03/21/19 0620 03/21/19 0924  BP: 131/76  (!) 150/72   Pulse: 89  89   Resp: 16  18   Temp: 98.2 F (36.8 C)  98.8 F (37.1 C)   TempSrc: Oral  Oral   SpO2: 97%  98%   Weight:  46.5 kg  47.4 kg  Height:        Intake/Output Summary (Last 24 hours) at 03/21/2019 1240 Last data filed at 03/21/2019 0600 Gross per 24 hour  Intake  1256.6 ml  Output 1400 ml  Net -143.4 ml   Filed Weights   03/20/19 0617 03/21/19 0500 03/21/19 0924  Weight: 45.9 kg 46.5 kg 47.4 kg    Examination:  General exam: NAD Respiratory system: CTAB anterior lung fields.  No wheezes, no crackles, no rhonchi.  Normal respiratory effort.   Cardiovascular system: RRR no murmurs rubs or gallops.  No JVD.  No lower extremity edema.  Gastrointestinal system: Abdomen is mildly distended, soft, hypoactive bowel sounds, diffuse tenderness to palpation.   Central nervous system: Alert and oriented. No focal neurological deficits. Extremities: Symmetric 5 x 5 power. Skin: No rashes, lesions or ulcers Psychiatry: Judgement and insight appear normal. Mood & affect appropriate.     Data Reviewed: I have personally reviewed following labs and imaging studies  CBC: Recent Labs  Lab 03/17/19 0333 03/18/19 0312 03/19/19 0453 03/20/19 0406 03/21/19 0258  WBC 4.8 4.6 5.7 6.1 5.9  NEUTROABS 2.4  --  3.2 3.4 3.8  HGB 10.1* 10.1* 10.9* 10.9* 13.6  HCT 31.7* 31.6* 34.4* 34.8* 41.8  MCV 99.7 99.1 98.9 100.0 97.7  PLT 156 168 175 173 [id number]   Basic Metabolic Panel: Recent Labs  Lab  03/15/19 1940  03/18/19 0312 03/18/19 1151 03/19/19 0453 03/20/19 0406 03/21/19 0258  NA 138   < > 139 140 140 136 135  K 3.1*   < > 2.6* 2.9* 2.6* 3.0* 4.4  CL 99   < > 109 105 101 101 101  CO2 25   < > 25 24 28 24 22   GLUCOSE 86   < > 107* 83 81 69* 86  BUN 11   < > <5* <5* <5* <5* 6*  CREATININE 0.62   < > 0.42* 0.49 0.53 0.45 0.50  CALCIUM 8.8*   < > 7.7* 8.0* 8.0* 8.0* 8.6*  MG 2.6*  --  1.8  --  2.0 1.9 2.2  PHOS  --   --   --   --   --   --  3.8   < > = values in this interval not displayed.   GFR: Estimated Creatinine Clearance: 39.2 mL/min (by C-G formula based on SCr of 0.5 mg/dL). Liver Function Tests: Recent Labs  Lab 03/15/19 1940  AST 16  ALT 7  ALKPHOS 48  BILITOT 1.1  PROT 7.0  ALBUMIN 3.4*   No results for input(s): LIPASE, AMYLASE  in the last 168 hours. No results for input(s): AMMONIA in the last 168 hours. Coagulation Profile: No results for input(s): INR, PROTIME in the last 168 hours. Cardiac Enzymes: No results for input(s): CKTOTAL, CKMB, CKMBINDEX, TROPONINI in the last 168 hours. BNP (last 3 results) No results for input(s): PROBNP in the last 8760 hours. HbA1C: No results for input(s): HGBA1C in the last 72 hours. CBG: Recent Labs  Lab 03/17/19 1613  GLUCAP 100*   Lipid Profile: No results for input(s): CHOL, HDL, LDLCALC, TRIG, CHOLHDL, LDLDIRECT in the last 72 hours. Thyroid Function Tests: No results for input(s): TSH, T4TOTAL, FREET4, T3FREE, THYROIDAB in the last 72 hours. Anemia Panel: No results for input(s): VITAMINB12, FOLATE, FERRITIN, TIBC, IRON, RETICCTPCT in the last 72 hours. Sepsis Labs: Recent Labs  Lab 03/15/19 1940  LATICACIDVEN 1.5    Recent Results (from the past 240 hour(s))  SARS CORONAVIRUS 2 (TAT 6-24 HRS) Nasopharyngeal Nasopharyngeal Swab     Status: None   Collection Time: 03/15/19 10:22 PM   Specimen: Nasopharyngeal Swab  Result Value Ref Range Status   SARS Coronavirus 2 NEGATIVE NEGATIVE Final    Comment: (NOTE) SARS-CoV-2 target nucleic acids are NOT DETECTED. The SARS-CoV-2 RNA is generally detectable in upper and lower respiratory specimens during the acute phase of infection. Negative results do not preclude SARS-CoV-2 infection, do not rule out co-infections with other pathogens, and should not be used as the sole basis for treatment or other patient management decisions. Negative results must be combined with clinical observations, patient history, and epidemiological information. The expected result is Negative. Fact Sheet for Patients: SugarRoll.be Fact Sheet for Healthcare Providers: https://www.woods-mathews.com/ This test is not yet approved or cleared by the [place] FDA and  has been authorized for  detection and/or diagnosis of SARS-CoV-2 by FDA under an Emergency Use Authorization (EUA). This EUA will remain  in effect (meaning this test can be used) for the duration of the COVID-19 declaration under Section 56 4(b)(1) of the Act, 21 U.S.C. section 360bbb-3(b)(1), unless the authorization is terminated or revoked sooner. Performed at [facility], [place] [address] [place], [place] [address]          Radiology Studies: Dg Abd Acute 2+v W 1v Chest  Result Date: 03/20/2019 CLINICAL DATA:  Abdominal pain in a patient with a history of chronic sigmoid diverticulitis and a small abscess on CT scan 03/11/2019. EXAM: DG ABDOMEN ACUTE W/ 1V CHEST COMPARISON:  CT abdomen and pelvis 03/11/2019. Chest and two views abdomen 03/15/2019 and two views of the abdomen 03/18/2019. FINDINGS: Single-view of the chest demonstrates clear lungs and normal heart size. Trace pleural effusions. No pneumothorax. Atherosclerosis noted. Two views of the abdomen show no free intraperitoneal air. Multiple gas-filled but nondilated loops of small and large bowel are present. No abnormal abdominal calcification or focal bony abnormality. IMPRESSION: Trace bilateral pleural effusions. Multiple gas-filled but nondilated loops of small and large bowel may be due to ileus. Electronically Signed   By: Inge Rise M.D.   On: 03/20/2019 11:15   [place] Ekg Site Rite  Result Date: 03/21/2019 If Site Rite image not attached, placement could not be confirmed due to current cardiac rhythm.       Scheduled Meds: . bisacodyl  10 mg Rectal Daily  . enoxaparin (LOVENOX) injection  30 mg Subcutaneous Q24H  . insulin aspart  0-9 Units Subcutaneous Q6H  . lip balm  1 application Topical BID  . mometasone-formoterol  2 puff Inhalation BID  . nystatin  5 mL Oral QID  . polyethylene glycol  34 g Oral BID  . sodium chloride flush  3 mL Intravenous Q12H  . umeclidinium bromide  1 puff Inhalation Daily   Continuous  Infusions: . sodium chloride Stopped (03/19/19 0507)  . fluconazole (DIFLUCAN) IV 100 mg (03/20/19 1917)  . piperacillin-tazobactam (ZOSYN)  IV 3.375 g (03/21/19 0533)  . TPN ADULT (ION)       LOS: 6 days    Time spent: 35 minutes    Irine Seal, MD [facility]  If 7PM-7AM, please contact night-coverage [email or web address] 03/21/2019, 12:40 PM

## 2019-03-21 NOTE — Progress Notes (Signed)
Initial Nutrition Assessment  INTERVENTION:   Monitor magnesium, potassium, and phosphorus daily for at least 3 days, MD to replete as needed, as pt is at risk for refeeding syndrome given poor PO intakes for at least 10 days.  -TPN Pharmacy -Will monitor for diet advancement  NUTRITION DIAGNOSIS:   Inadequate oral intake related to nausea, vomiting as evidenced by meal completion < 50%.  GOAL:   Patient will meet greater than or equal to 90% of their needs  MONITOR:   Labs, Weight trends, I & O's(TPN)  REASON FOR ASSESSMENT:   Consult New TPN/TNA  ASSESSMENT:   85 y.o. female with medical history significant for COPD, hyperlipidemia, and chronic sigmoid diverticulitis with known abscess and stricture in the distal sigmoid colon who presents to the ED for evaluation of abdominal pain.  **RD working remotely**  Patient reports abdominal pain that started around 4 months ago. Pt developed N/V 4 days PTA. Pt has been unable to tolerate diet advancements since admission. Currently NPO. Pt has had poor PO intake for at least 10 days now. Per surgery, pt may have surgery soon.   Pt expected to have a PICC line and TPN to begin tonight. Per Pharmacy note, pt to begin TPN at 30 ml/hr (provides 612 kcal, 30g protein).  Patient reports weight loss of 30 lbs over the last year. Per weight records, pt weighed 125 lbs on 3/11. Pt has lost 21 lbs (16% wt loss x 6 months, significant for time frame).  I/Os: -498 ml since admit UOP 9/7: 1.7L  Labs reviewed. Medications: IV Zofran PRN  NUTRITION - FOCUSED PHYSICAL EXAM:  Unable to perform- working remotely.  Diet Order:   Diet Order            Diet NPO time specified  Diet effective now              EDUCATION NEEDS:   No education needs have been identified at this time  Skin:  Skin Assessment: Reviewed RN Assessment  Last BM:  9/7  Height:   Ht Readings from Last 1 Encounters:  03/16/19 5\' 2"  (1.575 m)     Weight:   Wt Readings from Last 1 Encounters:  03/21/19 47.4 kg    Ideal Body Weight:  50 kg  BMI:  Body mass index is 19.11 kg/m.  Estimated Nutritional Needs:   Kcal:  1500-1700  Protein:  70-80g  Fluid:  1.7L/day  Clayton Bibles, MS, RD, LDN Inpatient Clinical Dietitian Pager: [phone] After Hours Pager: [phone]

## 2019-03-21 NOTE — Care Management Important Message (Signed)
Important Message  Patient Details  Name: Theresa Arnold MRN: [id number] Date of Birth: 08/10/1931   Medicare Important Message Given:  Yes. CMA printed out IM for Case Management Nurse or CSW to give to patient.      [name] 03/21/2019, 11:10 AM

## 2019-03-21 NOTE — Progress Notes (Signed)
[facility]/Trauma Progress Note      Assessment/Plan Principal Problem:   Sigmoid stricture (HCC) Active Problems:   COPD (chronic obstructive pulmonary disease) (HCC)   Diverticulitis of sigmoid colon   Abnormal CT scan, gastrointestinal tract   Colonic partial obstruction from sigmoid stricture   Polyp of sigmoid colon   Oral thrush  Partial colonic obstruction from sigmoid stricture presumed due to chronic recurrent diverticulitis - Abdominal x-ray yesterday so showed gas in large and small bowel suggesting ileus - small BM yesterday but no flatus since.  - Nausea and vomiting overnight - Discussed with the nurse, NG tube if patient vomits again - Abdominal x-ray pending this a.m. - Patient will likely need surgery if she continues with an obstructive pattern Severe malnutrition - Prealbumin 6.5 on 09/05 - will start TPN  FEN: NPO, TPN VTE: SCD's, lovenox ID: [email or web address] 09/02>> Foley: None Follow up: TBD  DISPO: Nausea vomiting overnight, TPN, patient may require surgery as early as tomorrow    LOS: 6 days    Subjective: CC: Abdominal pain, nausea vomiting  Patient states yesterday she had 1 or 2 small BMs with flatus but no flatus or BM since.  She feels bloated.  She has intermittent severe abdominal pains.  She has nausea with 1 episode of emesis overnight.  She is amenable to surgery.  Objective: Vital signs in last 24 hours: Temp:  [98.2 F (36.8 C)-98.8 F (37.1 C)] 98.8 F (37.1 C) (09/08 0620) Pulse Rate:  [69-89] 89 (09/08 0620) Resp:  [16-18] 18 (09/08 0620) BP: (131-150)/(72-76) 150/72 (09/08 0620) SpO2:  [97 %-100 %] 98 % (09/08 0620) Weight:  [46.5 kg] 46.5 kg (09/08 0500) Last BM Date: 03/20/19  Intake/Output from previous day: 09/07 0701 - 09/08 0700 In: 1496.6 [P.O.:1140; I.V.:137.2; IV Piggyback:219.4] Out: 1700 [Urine:1700] Intake/Output this shift: No intake/output data recorded.  PE: Gen:  Alert, NAD, pleasant,  cooperative Pulm: Rate and effort normal Abd: Soft, distended, hypoactive bowel sounds, generalized TTP with mild guarding, no peritonitis Skin: no rashes noted, warm and dry   Anti-infectives: Anti-infectives (From admission, onward)   Start     Dose/Rate Route Frequency Ordered Stop   03/18/19 1900  fluconazole (DIFLUCAN) IVPB 100 mg     100 mg 50 mL/hr over 60 Minutes Intravenous Every 24 hours 03/18/19 1753 03/25/19 1859   03/16/19 0600  piperacillin-tazobactam (ZOSYN) IVPB 3.375 g     3.375 g 12.5 mL/hr over 240 Minutes Intravenous Every 8 hours 03/15/19 2316     03/15/19 2315  piperacillin-tazobactam (ZOSYN) IVPB 3.375 g     3.375 g 100 mL/hr over 30 Minutes Intravenous NOW 03/15/19 2314 03/16/19 0038      Lab Results:  Recent Labs    03/20/19 0406 03/21/19 0258  WBC 6.1 5.9  HGB 10.9* 13.6  HCT 34.8* 41.8  PLT 173 221   BMET Recent Labs    03/20/19 0406 03/21/19 0258  NA 136 135  K 3.0* 4.4  CL 101 101  CO2 24 22  GLUCOSE 69* 86  BUN <5* 6*  CREATININE 0.45 0.50  CALCIUM 8.0* 8.6*   PT/INR No results for input(s): LABPROT, INR in the last 72 hours. CMP     Component Value Date/Time   NA 135 03/21/2019 0258   K 4.4 03/21/2019 0258   CL 101 03/21/2019 0258   CO2 22 03/21/2019 0258   GLUCOSE 86 03/21/2019 0258   BUN 6 (L) 03/21/2019 0258   CREATININE 0.50 03/21/2019 0258  CALCIUM 8.6 (L) 03/21/2019 0258   PROT 7.0 03/15/2019 1940   ALBUMIN 3.4 (L) 03/15/2019 1940   AST 16 03/15/2019 1940   ALT 7 03/15/2019 1940   ALKPHOS 48 03/15/2019 1940   BILITOT 1.1 03/15/2019 1940   GFRNONAA >60 03/21/2019 0258   GFRAA >60 03/21/2019 0258   Lipase     Component Value Date/Time   LIPASE 11.0 11/02/2018 1103    Studies/Results: Dg Abd Acute 2+v W 1v Chest  Result Date: 03/20/2019 CLINICAL DATA:  Abdominal pain in a patient with a history of chronic sigmoid diverticulitis and a small abscess on CT scan 03/11/2019. EXAM: DG ABDOMEN ACUTE W/ 1V CHEST  COMPARISON:  CT abdomen and pelvis 03/11/2019. Chest and two views abdomen 03/15/2019 and two views of the abdomen 03/18/2019. FINDINGS: Single-view of the chest demonstrates clear lungs and normal heart size. Trace pleural effusions. No pneumothorax. Atherosclerosis noted. Two views of the abdomen show no free intraperitoneal air. Multiple gas-filled but nondilated loops of small and large bowel are present. No abnormal abdominal calcification or focal bony abnormality. IMPRESSION: Trace bilateral pleural effusions. Multiple gas-filled but nondilated loops of small and large bowel may be due to ileus. Electronically Signed   By: Inge Rise M.D.   On: 03/20/2019 11:15      Kalman Drape , [facility] Surgery 03/21/2019, 9:15 AM  Pager: [phone] Mon-Wed, Friday 7:00am-4:30pm Thurs 7am-11:30am  Consults: [phone]

## 2019-03-22 ENCOUNTER — Encounter (HOSPITAL_COMMUNITY): Payer: Self-pay | Admitting: General Practice

## 2019-03-22 ENCOUNTER — Inpatient Hospital Stay (HOSPITAL_COMMUNITY): Payer: Medicare Other | Admitting: Anesthesiology

## 2019-03-22 ENCOUNTER — Encounter (HOSPITAL_COMMUNITY)
Admission: EM | Disposition: A | Discharge status: Skilled Nursing Facility | Payer: Self-pay | Source: Home / Self Care | Attending: Internal Medicine

## 2019-03-22 HISTORY — PX: LAPAROSCOPIC PARTIAL COLECTOMY: SHX5907

## 2019-03-22 LAB — SURGICAL PCR SCREEN
MRSA, PCR: NEGATIVE
Staphylococcus aureus: NEGATIVE

## 2019-03-22 LAB — DIFFERENTIAL
Abs Immature Granulocytes: 0.01 10*3/uL (ref 0.00–0.07)
Basophils Absolute: 0 10*3/uL (ref 0.0–0.1)
Basophils Relative: 0 %
Eosinophils Absolute: 0.1 10*3/uL (ref 0.0–0.5)
Eosinophils Relative: 2 %
Immature Granulocytes: 0 %
Lymphocytes Relative: 36 %
Lymphs Abs: 1.9 10*3/uL (ref 0.7–4.0)
Monocytes Absolute: 0.6 10*3/uL (ref 0.1–1.0)
Monocytes Relative: 12 %
Neutro Abs: 2.6 10*3/uL (ref 1.7–7.7)
Neutrophils Relative %: 50 %

## 2019-03-22 LAB — TRIGLYCERIDES: Triglycerides: 91 mg/dL (ref <150)

## 2019-03-22 LAB — GLUCOSE, CAPILLARY
Glucose-Capillary: 101 mg/dL — ABNORMAL HIGH (ref 70–99)
Glucose-Capillary: 103 mg/dL — ABNORMAL HIGH (ref 70–99)
Glucose-Capillary: 222 mg/dL — ABNORMAL HIGH (ref 70–99)

## 2019-03-22 LAB — COMPREHENSIVE METABOLIC PANEL
ALT: 6 U/L (ref 0–44)
AST: 13 U/L — ABNORMAL LOW (ref 15–41)
Albumin: 2.5 g/dL — ABNORMAL LOW (ref 3.5–5.0)
Alkaline Phosphatase: 35 U/L — ABNORMAL LOW (ref 38–126)
Anion gap: 9 (ref 5–15)
BUN: 8 mg/dL (ref 8–23)
CO2: 25 mmol/L (ref 22–32)
Calcium: 8 mg/dL — ABNORMAL LOW (ref 8.9–10.3)
Chloride: 101 mmol/L (ref 98–111)
Creatinine, Ser: 0.5 mg/dL (ref 0.44–1.00)
GFR calc Af Amer: >60 mL/min (ref >60)
GFR calc non Af Amer: >60 mL/min (ref >60)
Glucose, Bld: 111 mg/dL — ABNORMAL HIGH (ref 70–99)
Potassium: 3.4 mmol/L — ABNORMAL LOW (ref 3.5–5.1)
Sodium: 135 mmol/L (ref 135–145)
Total Bilirubin: 0.9 mg/dL (ref 0.3–1.2)
Total Protein: 5.5 g/dL — ABNORMAL LOW (ref 6.5–8.1)

## 2019-03-22 LAB — PREALBUMIN: Prealbumin: 5.6 mg/dL — ABNORMAL LOW (ref 18–38)

## 2019-03-22 LAB — MAGNESIUM: Magnesium: 2 mg/dL (ref 1.7–2.4)

## 2019-03-22 LAB — PHOSPHORUS: Phosphorus: 3.4 mg/dL (ref 2.5–4.6)

## 2019-03-22 LAB — CBC
HCT: 33.5 % — ABNORMAL LOW (ref 36.0–46.0)
Hemoglobin: 10.8 g/dL — ABNORMAL LOW (ref 12.0–15.0)
MCH: 31.5 pg (ref 26.0–34.0)
MCHC: 32.2 g/dL (ref 30.0–36.0)
MCV: 97.7 fL (ref 80.0–100.0)
Platelets: 188 10*3/uL (ref 150–400)
RBC: 3.43 MIL/uL — ABNORMAL LOW (ref 3.87–5.11)
RDW: 14.5 % (ref 11.5–15.5)
WBC: 5.2 10*3/uL (ref 4.0–10.5)
nRBC: 0 % (ref 0.0–0.2)

## 2019-03-22 SURGERY — LAPAROSCOPIC PARTIAL COLECTOMY
Anesthesia: General | Site: Abdomen

## 2019-03-22 MED ORDER — SUCCINYLCHOLINE CHLORIDE 200 MG/10ML IV SOSY
PREFILLED_SYRINGE | INTRAVENOUS | Status: AC
  Filled 2019-03-22: qty 10

## 2019-03-22 MED ORDER — ACETAMINOPHEN 325 MG PO TABS
650.0000 mg | ORAL_TABLET | Freq: Four times a day (QID) | ORAL | Status: DC
  Administered 2019-03-23 – 2019-03-24 (×5): 650 mg via ORAL
  Filled 2019-03-22 (×6): qty 2

## 2019-03-22 MED ORDER — PHENYLEPHRINE HCL (PRESSORS) 10 MG/ML IV SOLN
INTRAVENOUS | Status: DC | PRN
  Administered 2019-03-22: 120 ug via INTRAVENOUS
  Administered 2019-03-22: 80 ug via INTRAVENOUS

## 2019-03-22 MED ORDER — LIDOCAINE 2% (20 MG/ML) 5 ML SYRINGE
INTRAMUSCULAR | Status: DC | PRN
  Administered 2019-03-22: 40 mg via INTRAVENOUS

## 2019-03-22 MED ORDER — ROCURONIUM BROMIDE 10 MG/ML (PF) SYRINGE
PREFILLED_SYRINGE | INTRAVENOUS | Status: DC | PRN
  Administered 2019-03-22: 50 mg via INTRAVENOUS
  Administered 2019-03-22: 10 mg via INTRAVENOUS

## 2019-03-22 MED ORDER — FENTANYL CITRATE (PF) 100 MCG/2ML IJ SOLN
INTRAMUSCULAR | Status: AC
  Filled 2019-03-22: qty 2

## 2019-03-22 MED ORDER — PROMETHAZINE HCL 25 MG/ML IJ SOLN
6.2500 mg | Freq: Once | INTRAMUSCULAR | Status: AC
  Administered 2019-03-22: 17:00:00 6.25 mg via INTRAVENOUS

## 2019-03-22 MED ORDER — LIDOCAINE 2% (20 MG/ML) 5 ML SYRINGE
INTRAMUSCULAR | Status: AC
  Filled 2019-03-22: qty 5

## 2019-03-22 MED ORDER — DEXAMETHASONE SODIUM PHOSPHATE 4 MG/ML IJ SOLN
INTRAMUSCULAR | Status: DC | PRN
  Administered 2019-03-22: 10 mg via INTRAVENOUS

## 2019-03-22 MED ORDER — SUFENTANIL CITRATE 50 MCG/ML IV SOLN
INTRAVENOUS | Status: AC
  Filled 2019-03-22: qty 1

## 2019-03-22 MED ORDER — POTASSIUM CHLORIDE 10 MEQ/100ML IV SOLN
10.0000 meq | INTRAVENOUS | Status: AC
  Administered 2019-03-22 (×2): 10 meq via INTRAVENOUS
  Filled 2019-03-22: qty 100

## 2019-03-22 MED ORDER — ACETAMINOPHEN 10 MG/ML IV SOLN
1000.0000 mg | Freq: Once | INTRAVENOUS | Status: DC | PRN
  Administered 2019-03-22: 17:00:00 1000 mg via INTRAVENOUS

## 2019-03-22 MED ORDER — DEXAMETHASONE SODIUM PHOSPHATE 10 MG/ML IJ SOLN
INTRAMUSCULAR | Status: AC
  Filled 2019-03-22: qty 1

## 2019-03-22 MED ORDER — PROMETHAZINE HCL 25 MG/ML IJ SOLN
INTRAMUSCULAR | Status: AC
  Administered 2019-03-22: 6.25 mg via INTRAVENOUS
  Filled 2019-03-22: qty 1

## 2019-03-22 MED ORDER — MEPERIDINE HCL 50 MG/ML IJ SOLN: 6.2500 mg | INTRAMUSCULAR | Status: DC | PRN

## 2019-03-22 MED ORDER — 0.9 % SODIUM CHLORIDE (POUR BTL) OPTIME
TOPICAL | Status: DC | PRN
  Administered 2019-03-22: 3000 mL

## 2019-03-22 MED ORDER — HYDROMORPHONE HCL 1 MG/ML IJ SOLN
INTRAMUSCULAR | Status: AC
  Administered 2019-03-22: 17:00:00 0.5 mg via INTRAVENOUS
  Filled 2019-03-22: qty 1

## 2019-03-22 MED ORDER — LACTATED RINGERS IV SOLN
INTRAVENOUS | Status: DC
  Administered 2019-03-22 (×3): via INTRAVENOUS

## 2019-03-22 MED ORDER — POLYETHYLENE GLYCOL 3350 17 G PO PACK: 34.0000 g | PACK | Freq: Every day | ORAL | Status: DC | PRN

## 2019-03-22 MED ORDER — ONDANSETRON HCL 4 MG/2ML IJ SOLN
INTRAMUSCULAR | Status: AC
  Filled 2019-03-22: qty 2

## 2019-03-22 MED ORDER — SUFENTANIL CITRATE 50 MCG/ML IV SOLN
INTRAVENOUS | Status: DC | PRN
  Administered 2019-03-22: 5 ug via INTRAVENOUS
  Administered 2019-03-22 (×4): 10 ug via INTRAVENOUS
  Administered 2019-03-22: 5 ug via INTRAVENOUS
  Administered 2019-03-22: 10 ug via INTRAVENOUS

## 2019-03-22 MED ORDER — ROCURONIUM BROMIDE 10 MG/ML (PF) SYRINGE
PREFILLED_SYRINGE | INTRAVENOUS | Status: AC
  Filled 2019-03-22: qty 10

## 2019-03-22 MED ORDER — SUGAMMADEX SODIUM 200 MG/2ML IV SOLN
INTRAVENOUS | Status: DC | PRN
  Administered 2019-03-22: 100 mg via INTRAVENOUS

## 2019-03-22 MED ORDER — ACETAMINOPHEN 10 MG/ML IV SOLN
INTRAVENOUS | Status: AC
  Filled 2019-03-22: qty 100

## 2019-03-22 MED ORDER — DOCUSATE SODIUM 100 MG PO CAPS
100.0000 mg | ORAL_CAPSULE | Freq: Two times a day (BID) | ORAL | Status: DC
  Administered 2019-03-22 – 2019-03-27 (×4): 100 mg via ORAL
  Filled 2019-03-22 (×10): qty 1

## 2019-03-22 MED ORDER — ONDANSETRON HCL 4 MG/2ML IJ SOLN
4.0000 mg | Freq: Once | INTRAMUSCULAR | Status: AC | PRN
  Administered 2019-03-22: 16:00:00 4 mg via INTRAVENOUS

## 2019-03-22 MED ORDER — SUCCINYLCHOLINE CHLORIDE 20 MG/ML IJ SOLN
INTRAMUSCULAR | Status: DC | PRN
  Administered 2019-03-22: 80 mg via INTRAVENOUS

## 2019-03-22 MED ORDER — SODIUM CHLORIDE (PF) 0.9 % IJ SOLN
INTRAMUSCULAR | Status: AC
  Filled 2019-03-22: qty 10

## 2019-03-22 MED ORDER — FENTANYL CITRATE (PF) 100 MCG/2ML IJ SOLN
25.0000 ug | INTRAMUSCULAR | Status: DC | PRN
  Administered 2019-03-22 (×3): 50 ug via INTRAVENOUS

## 2019-03-22 MED ORDER — TRAVASOL 10 % IV SOLN
INTRAVENOUS | Status: AC
  Administered 2019-03-22: 19:00:00 via INTRAVENOUS
  Filled 2019-03-22: qty 540

## 2019-03-22 MED ORDER — HYDROMORPHONE HCL 1 MG/ML IJ SOLN
0.5000 mg | INTRAMUSCULAR | Status: DC | PRN
  Administered 2019-03-22 (×2): 0.5 mg via INTRAVENOUS

## 2019-03-22 MED ORDER — BUPIVACAINE LIPOSOME 1.3 % IJ SUSP
20.0000 mL | Freq: Once | INTRAMUSCULAR | Status: AC
  Administered 2019-03-22: 20 mL
  Filled 2019-03-22: qty 20

## 2019-03-22 MED ORDER — PROPOFOL 10 MG/ML IV BOLUS
INTRAVENOUS | Status: DC | PRN
  Administered 2019-03-22: 100 mg via INTRAVENOUS

## 2019-03-22 MED ORDER — METHOCARBAMOL 500 MG PO TABS
500.0000 mg | ORAL_TABLET | Freq: Four times a day (QID) | ORAL | Status: DC | PRN
  Administered 2019-03-23: 08:00:00 500 mg via ORAL
  Filled 2019-03-22: qty 1

## 2019-03-22 MED ORDER — ONDANSETRON HCL 4 MG/2ML IJ SOLN
INTRAMUSCULAR | Status: DC | PRN
  Administered 2019-03-22: 4 mg via INTRAVENOUS

## 2019-03-22 MED ORDER — PROPOFOL 10 MG/ML IV BOLUS
INTRAVENOUS | Status: AC
  Filled 2019-03-22: qty 20

## 2019-03-22 MED ORDER — PIPERACILLIN-TAZOBACTAM 3.375 G IVPB
3.3750 g | Freq: Three times a day (TID) | INTRAVENOUS | Status: AC
  Administered 2019-03-22: 3.375 g via INTRAVENOUS
  Filled 2019-03-22: qty 50

## 2019-03-22 SURGICAL SUPPLY — 69 items
APPLIER CLIP 5 13 M/L LIGAMAX5 (MISCELLANEOUS)
APPLIER CLIP ROT 10 11.4 M/L (STAPLE)
BARRIER SKIN 2 3/4 (OSTOMY) ×2 IMPLANT
BARRIER SKIN OD2.25 2 3/4 FLNG (OSTOMY) IMPLANT
BLADE EXTENDED COATED 6.5IN (ELECTRODE) IMPLANT
CABLE HIGH FREQUENCY MONO STRZ (ELECTRODE) ×1 IMPLANT
CELLS DAT CNTRL 66122 CELL SVR (MISCELLANEOUS) ×1 IMPLANT
CHLORAPREP W/TINT 26 (MISCELLANEOUS) ×2 IMPLANT
CLIP APPLIE 5 13 M/L LIGAMAX5 (MISCELLANEOUS) IMPLANT
CLIP APPLIE ROT 10 11.4 M/L (STAPLE) IMPLANT
COUNTER NEEDLE 20 DBL MAG RED (NEEDLE) ×2 IMPLANT
COVER MAYO STAND STRL (DRAPES) ×5 IMPLANT
COVER SURGICAL LIGHT HANDLE (MISCELLANEOUS) ×2 IMPLANT
COVER WAND RF STERILE (DRAPES) IMPLANT
DECANTER SPIKE VIAL GLASS SM (MISCELLANEOUS) ×2 IMPLANT
DERMABOND ADVANCED (GAUZE/BANDAGES/DRESSINGS) ×2
DERMABOND ADVANCED .7 DNX12 (GAUZE/BANDAGES/DRESSINGS) IMPLANT
DRAPE LAPAROSCOPIC ABDOMINAL (DRAPES) ×1 IMPLANT
DRSG OPSITE POSTOP 4X10 (GAUZE/BANDAGES/DRESSINGS) IMPLANT
DRSG OPSITE POSTOP 4X6 (GAUZE/BANDAGES/DRESSINGS) ×1 IMPLANT
DRSG OPSITE POSTOP 4X8 (GAUZE/BANDAGES/DRESSINGS) IMPLANT
ELECT REM PT RETURN 15FT ADLT (MISCELLANEOUS) ×2 IMPLANT
GAUZE SPONGE 4X4 12PLY STRL (GAUZE/BANDAGES/DRESSINGS) IMPLANT
GLOVE BIOGEL M 8.0 STRL (GLOVE) ×4 IMPLANT
GOWN STRL REUS W/TWL XL LVL3 (GOWN DISPOSABLE) ×12 IMPLANT
KIT TURNOVER KIT A (KITS) IMPLANT
LEGGING LITHOTOMY PAIR STRL (DRAPES) IMPLANT
LIGASURE IMPACT 36 18CM CVD LR (INSTRUMENTS) ×1 IMPLANT
MARKER SKIN DUAL TIP RULER LAB (MISCELLANEOUS) ×1 IMPLANT
PACK COLON (CUSTOM PROCEDURE TRAY) ×2 IMPLANT
PAD POSITIONING PINK XL (MISCELLANEOUS) ×2 IMPLANT
PORT LAP GEL ALEXIS MED 5-9CM (MISCELLANEOUS) IMPLANT
POUCH OSTOMY 2 3/4  H 3804 (WOUND CARE) ×1
POUCH OSTOMY 2 PC DRNBL 2.75 (WOUND CARE) IMPLANT
PROTECTOR NERVE ULNAR (MISCELLANEOUS) ×4 IMPLANT
RETRACTOR WND ALEXIS 18 MED (MISCELLANEOUS) IMPLANT
RTRCTR WOUND ALEXIS 18CM MED (MISCELLANEOUS) ×2
SCISSORS LAP 5X45 EPIX DISP (ENDOMECHANICALS) ×2 IMPLANT
SET IRRIG TUBING LAPAROSCOPIC (IRRIGATION / IRRIGATOR) ×2 IMPLANT
SET TUBE SMOKE EVAC HIGH FLOW (TUBING) ×2 IMPLANT
SHEARS HARMONIC ACE PLUS 45CM (MISCELLANEOUS) ×1 IMPLANT
SLEEVE XCEL OPT CAN 5 100 (ENDOMECHANICALS) ×5 IMPLANT
SPONGE LAP 18X18 RF (DISPOSABLE) ×1 IMPLANT
STAPLER CUT CVD 40MM BLUE (STAPLE) ×1 IMPLANT
STAPLER CUT RELOAD BLUE (STAPLE) ×1 IMPLANT
STAPLER VISISTAT 35W (STAPLE) ×2 IMPLANT
SUT CHROMIC 3 0 SH 27 (SUTURE) ×2 IMPLANT
SUT MNCRL AB 4-0 PS2 18 (SUTURE) ×1 IMPLANT
SUT NOVA NAB DX-16 0-1 5-0 T12 (SUTURE) ×3 IMPLANT
SUT PDS AB 1 CTX 36 (SUTURE) IMPLANT
SUT PDS AB 1 TP1 96 (SUTURE) IMPLANT
SUT PDS AB 4-0 SH 27 (SUTURE) IMPLANT
SUT PROLENE 2 0 KS (SUTURE) IMPLANT
SUT SILK 2 0 (SUTURE) ×1
SUT SILK 2 0 SH CR/8 (SUTURE) ×2 IMPLANT
SUT SILK 2-0 18XBRD TIE 12 (SUTURE) ×1 IMPLANT
SUT SILK 3 0 (SUTURE) ×1
SUT SILK 3 0 SH CR/8 (SUTURE) ×2 IMPLANT
SUT SILK 3-0 18XBRD TIE 12 (SUTURE) ×1 IMPLANT
SUT VIC AB 2-0 SH 27 (SUTURE) ×1
SUT VIC AB 2-0 SH 27X BRD (SUTURE) IMPLANT
SUT VIC AB 4-0 SH 18 (SUTURE) ×3 IMPLANT
SYS LAPSCP GELPORT 120MM (MISCELLANEOUS)
SYSTEM LAPSCP GELPORT 120MM (MISCELLANEOUS) IMPLANT
TOWEL OR NON WOVEN STRL DISP B (DISPOSABLE) ×2 IMPLANT
TRAY FOLEY MTR SLVR 14FR STAT (SET/KITS/TRAYS/PACK) ×1 IMPLANT
TROCAR BLADELESS OPT 5 100 (ENDOMECHANICALS) ×2 IMPLANT
TROCAR XCEL NON-BLD 11X100MML (ENDOMECHANICALS) IMPLANT
TUBING CONNECTING 10 (TUBING) IMPLANT

## 2019-03-22 NOTE — Transfer of Care (Signed)
Immediate Anesthesia Transfer of Care Note  Patient: Theresa Arnold  Procedure(s) Performed: LAPAROSCOPY, SPLENIC FLEXURE MOBILIZATION WITH OPEN HARTMANN PROCEDURE (N/A Abdomen)  Patient Location: PACU  Anesthesia Type:General  Level of Consciousness: awake, alert , oriented and patient cooperative  Airway & Oxygen Therapy: Patient Spontanous Breathing and Patient connected to face mask  Post-op Assessment: Report given to RN and Post -op Vital signs reviewed and stable  Post vital signs: Reviewed and stable  Last Vitals:  Vitals Value Taken Time  BP 148/81 03/22/19 1549  Temp    Pulse    Resp 20 03/22/19 1551  SpO2    Vitals shown include unvalidated device data.  Last Pain:  Vitals:   03/22/19 1150  TempSrc:   PainSc: 7       Patients Stated Pain Goal: 2 ([id number])  Complications: No apparent anesthesia complications

## 2019-03-22 NOTE — Progress Notes (Signed)
OT Cancellation Note  Patient Details Name: Theresa Arnold MRN: [id number] DOB: 31-Jan-1932   Cancelled Treatment:    Reason Eval/Treat Not Completed: Other (comment). [name] Plan to reattempt tomorrow as able.   Tyrone Schimke, OT Acute Rehabilitation Services Pager: [phone] Office: [phone]  03/22/2019, 9:32 AM

## 2019-03-22 NOTE — Anesthesia Procedure Notes (Signed)
Procedure Name: Intubation Date/Time: 03/22/2019 1:01 PM Performed by: Eulas Post, Dulcemaria Bula W, CRNA Pre-anesthesia Checklist: Patient identified, Emergency Drugs available, Suction available and Patient being monitored Patient Re-evaluated:Patient Re-evaluated prior to induction Oxygen Delivery Method: Circle system utilized Preoxygenation: Pre-oxygenation with 100% oxygen Induction Type: IV induction Ventilation: Mask ventilation without difficulty Laryngoscope Size: Miller and 2 Grade View: Grade I Tube type: Oral Tube size: 7.0 mm Number of attempts: 1 Airway Equipment and Method: Stylet Placement Confirmation: ETT inserted through vocal cords under direct vision,  positive ETCO2 and breath sounds checked- equal and bilateral Secured at: 21 cm Tube secured with: Tape Dental Injury: Teeth and Oropharynx as per pre-operative assessment

## 2019-03-22 NOTE — Progress Notes (Signed)
PHARMACY - ADULT TOTAL PARENTERAL NUTRITION CONSULT NOTE   Pharmacy Consult for TPN Indication: ileus, malnutrition- intolerance to enteral feeding  Patient Measurements: Height: 5\' 2"  (157.5 cm) Weight: 103 lb 9.9 oz (47 kg) IBW/kg (Calculated) : 50.1 TPN AdjBW (KG): 47 Body mass index is 18.95 kg/m. Usual Weight:   Insulin Requirements: on sSSI (has not required any insuline since TPN started yesterday)  Current Nutrition: TPN  IVF: none  Central access: PICC  TPN start date: 03/21/19  ASSESSMENT                                                                                                          HPI: Patient is an 85 y.o F with hx recurrent diverticulitis presented to the ED on 03/15/19 with c/o abd pain.  Abd x-ray on 9/2 showed findings with suspicion for obstruction at distal colon or ileus. CCS recom possible colectomy this week (9/8-9/11).  Pharmacy is consulted to start TPN on 9/8 for ileus and malnutrition.  Significant events:  9/3: clear liquids 9/4: changed to full liquids  Today:   Glucose (goal <150): wnl  Electrolytes: K down 3.4, mag 2, phos 3.4 CorrCa 9.20; CL and CO2 wnl  Renal: scr ok  LFTs: wnl   TGs: 91 (9/9)  Prealbumin: 6.5 (9/5), 5.6 (9/9)  NUTRITIONAL GOALS                                                                                             RD recs (9/8):  Estimated Nutritional Needs:  Kcal:  1500-1700 Protein:  70-80g Fluid:  1.7L/day  Custom TPN at goal rate of 70 ml/hr provides: -  76 g/day protein ( 45  g/L) -  44 g/day Lipid  (26 g/L) -  269  g/day Dextrose (16 %) -  1654 Kcal/day  PLAN                                                                                                                          Now:  - potassium chloride 10 mEq IV x2 runs  At 1800 today:  Increase TPN rate to 50 ml/hr. -  Provides 54 g  of protein, 192 g of dextrose, and 31 g of lipids which provides 1181 kCals per day, meeting 78 % of  goal kcal and goal AA  Electrolytes in TPN: Standard except increase K to 60 mEq/L, Cl:Ac ratio 1:1  Plan to advance as tolerated to the goal rate.  TPN to contain standard multivitamins and trace elements.  continue sensitive SSI q6h  TPN lab panels on Mondays & Thursdays.  CMET, phos, mag daily through 9/6 since patient is at high risk for refeeding syndrome  F/u daily.  Theresa Arnold P 03/22/2019,7:49 AM

## 2019-03-22 NOTE — Anesthesia Preprocedure Evaluation (Signed)
Anesthesia Evaluation    Airway Mallampati: I       Dental  (+) Missing   Pulmonary former smoker,    Pulmonary exam normal breath sounds clear to auscultation       Cardiovascular Normal cardiovascular exam Rhythm:Regular Rate:Normal     Neuro/Psych negative psych ROS   GI/Hepatic Neg liver ROS,   Endo/Other  negative endocrine ROS  Renal/GU negative Renal ROS  negative genitourinary   Musculoskeletal   Abdominal Normal abdominal exam  (+)   Peds  Hematology  (+) Blood dyscrasia, anemia ,   Anesthesia Other Findings   - Left ventricle: The cavity size was normal. Wall thickness  was normal. Systolic function was normal. The estimated  ejection fraction was in the range of 60% to 65%. Wall  motion was normal; there were no regional wall motion  abnormalities. Doppler parameters are consistent with  abnormal left ventricular relaxation (grade 1 diastolic  dysfunction).  - Aortic valve: There was no stenosis. Trivial  regurgitation.  - Mitral valve: No significant regurgitation.  - Right ventricle: The cavity size was normal. Systolic  function was normal.  - Pulmonary arteries: PA peak pressure: 86mm Hg (S).  - Inferior vena cava: The vessel was normal in size; the  respirophasic diameter changes were in the normal range (=  50%); findings are consistent with normal central venous  pressure.  Impressions:   Reproductive/Obstetrics                             Anesthesia Physical Anesthesia Plan  ASA: II  Anesthesia Plan: General   Post-op Pain Management:    Induction: Intravenous  PONV Risk Score and Plan: Ondansetron  Airway Management Planned: Oral ETT  Additional Equipment: None  Intra-op Plan:   Post-operative Plan: Extubation in OR  Informed Consent: I have reviewed the patients History and Physical, chart, labs and discussed the procedure  including the risks, benefits and alternatives for the proposed anesthesia with the patient or authorized representative who has indicated his/her understanding and acceptance.     Dental advisory given  Plan Discussed with: CRNA  Anesthesia Plan Comments:         Anesthesia Quick Evaluation

## 2019-03-22 NOTE — Interval H&P Note (Signed)
History and Physical Interval Note:  [id number] PM  Theresa Arnold  has presented today for surgery, with the diagnosis of SIGMOID STRICTURE.  The various methods of treatment have been discussed with the patient and family. After consideration of risks, benefits and other options for treatment, the patient has consented to  Procedure(s): LAPAROSCOPIC POSSIBLE OPEN PARTIAL COLECTOMY WITH POSSIBLE COLOSTOMY (N/A) as a surgical intervention.  The patient's history has been reviewed, patient examined, no change in status, stable for surgery.  I have reviewed the patient's chart and labs.  Questions were answered to the patient's satisfaction.     Pedro Earls

## 2019-03-22 NOTE — Consult Note (Signed)
[place] Nurse requested for preoperative stoma site marking  Discussed surgical procedure and stoma creation with patient and family.  Explained role of the [facility] nurse team.  Provided the patient with educational booklet and provided samples of pouching options.  Answered patient and family questions.   Examined patient lying, sitting in order to place the marking in the patient's visual field, away from any creases or abdominal contour issues and within the rectus muscle.  Attempted to mark above the patient's belt line.   Marked for colostomy in the LUQ  _6___ cm to the left of the umbilicus and [id number] above the umbilicus.  Patient's abdomen cleansed with CHG wipes at site markings, allowed to air dry prior to marking.Covered mark with thin film transparent dressing to preserve mark until date of surgery.   [place] Nurse team will follow up with patient after surgery for continue ostomy care and teaching.   Val Riles, RN, MSN, CWOCN, CNS-BC, pager [phone]

## 2019-03-22 NOTE — Op Note (Signed)
Theresa Arnold  [id number] 22 March 2019    PCP:  Biagio Borg, MD   Surgeon: Kaylyn Lim, MD, FACS  Asst:  Margie Billet, PA-a first assistant was necessary to provide exposure and skills for safe resection  Anes:  general  Preop Dx: Diverticulitis of the sigmoid with obstruction Postop Dx: same  Procedure: Laparoscopy and mobilization of the splenic flexure; laparotomy and sigmoid colectomy Jeanette Caprice procedure) with end colostomy Location Surgery: [facility] 1 Complications: None noted  EBL:   50 cc  Drains: none  Description of Procedure:  The patient was taken to OR 1 .  After anesthesia was administered and the patient was prepped  with Chloroprep and a timeout was performed.  Access to the abdomen was achieved through the right upper quadrant with a 5 mm Optiview.  The small bowel was distended and a distorted segment of sigmoid was stuck to the left pelvic side wall.  Two more 5 mm ports were place in the lower midline and in the left mid abdomen near the site of the ostomy marking.  With the angled 5 mm scope, the Harmonic scalpel was used to mobilize the descending colon and the splenic flexure.    Distally the sigmoid was stuck in the pelvis to the fallopian tube on the left.  This required open mobiization and finger fracture because of the chronicity and densitiy of the pelvic fixation.  The wound protector was in place and there was no gross contamination although the specimen had two areas of perforation that were sealed by the side wall.  The sigmoid was brought up in to the wound and divided proximally and distally with the Contour blue stapler.  Because of the proximal bowel dilatation, her age, and her compromised state, I felt that it safest to perform a Hartmann procedure.  The ostomy marking was used and an ostomy defect made in the skin and fascia.  The end of the colon was brought out and appeared viable.    The sponge and needle count was correct.  The gloves were  changed.  The peritoneum was closed with 2-0 vicryl.  Exparel was injected (20 cc).   The fascia was closed with interrupted 0 Novafil with inverted knots and the skin closed with 4-0 vicryl, running 4-0 Monocryl, and Dermabond.  The ostomy was closed matured with running 3-0 chromic locking.  The ostomy was tacked with a 4-0 silk inside the abdomen before closure.    The patient tolerated the procedure well and was taken to the PACU in stable condition.     Theresa Arnold, [place], [facility], [place]

## 2019-03-22 NOTE — Progress Notes (Signed)
PROGRESS NOTE    Theresa Arnold  [id number] DOB: 03/28/1932 DOA: 03/15/2019 PCP: Biagio Borg, MD    Brief Narrative:  Per Dr. Dondra Prader I Theresa Arnold is a 85 y.o. female with medical history significant for COPD, hyperlipidemia, and chronic sigmoid diverticulitis with known abscess and stricture in the distal sigmoid colon who presented to the ED for evaluation of abdominal pain.  Patient follows with GI, Dr. Tarri Glenn, for known sigmoid diverticulitis and abscess.  Per GI documentation, case was discussed with IR and fluid collection was not considered amenable to IR drainage.  Theresa Arnold underwent colonoscopy 01/27/2019 which was limited due to presence of stool.  Distal sigmoid stricture was seen.  Theresa Arnold was reportedly referred to general surgery, [facility] Dr. Dema Severin, and has no present plans for surgical intervention.  Patient was seen in the ED on 03/11/2019 at which time a repeat CT abdomen/pelvis with contrast showed an apparent increase in proximal sigmoid colon wall thickening and unchanged 3.5 cm intramural sigmoid colonic wall abscess/collection which was present since 11/02/2018.  There was question of low-grade obstruction. Theresa Arnold was given a prescription for ciprofloxacin and metronidazole.  Patient stated that Theresa Arnold has had continued generalized abdominal pain.  Theresa Arnold has not had oral intake due to the nausea after eating.  Theresa Arnold has been unable to take her home medications including the recently prescribed antibiotics.  Theresa Arnold stated that Theresa Arnold has not had any bowel movements despite use of MiraLAX for several days.  Theresa Arnold reported a 30 pound weight loss since last year.  Theresa Arnold has not had any subjective fevers.  Theresa Arnold reported good urine output without dysuria.  ED Course:  Initial vitals showed BP 110/71, pulse 82, RR 23, temp 97.6 Fahrenheit, SPO2 98% on room air  Labs notable for potassium 3.1, magnesium 2.6, BUN 11, creatinine 0.62, WBC 7.1, hemoglobin 13.8, platelets 200,000.  Abdominal x-ray showed  sub-pathologic gaseous distention of small bowel loops and colon which may represent obstruction at the distal colon or ileus.  Patient was given IV fluids, Protonix, Zofran, and the hospitalist service was consulted to admit for further evaluation and management.   Assessment & Plan:   Principal Problem:   Sigmoid stricture (HCC) Active Problems:   Diverticulitis of sigmoid colon   COPD (chronic obstructive pulmonary disease) (HCC)   Abnormal CT scan, gastrointestinal tract   Colonic partial obstruction from sigmoid stricture   Polyp of sigmoid colon   Oral thrush  1 chronic/recurrent sigmoid diverticulitis with known abscess and distal sigmoid stricture/perforation Patient with recurrent symptoms.  Patient noted to have some intermittent abdominal pain with weight loss felt secondary to low-grade diverticular stricture.  Patient was being followed by GI who had previously recommended surgery and patient was referred to general surgery.  Patient was advanced to a full liquid diet per general surgery on 03/17/2019 however patient unable to tolerate full liquid diet with some abdominal discomfort.  Patient on MiraLAX 17 g twice daily. General surgery feels patient will benefit from resection given recurrence of her symptoms, weight loss.  Patient initially seemed apprehensive however seems more agreeable to surgery now if needed.  Patient's diet was downgraded to n.p.o. per general surgery who are recommending sigmoid colectomy in the near future. Patient denies any recent chest pain or shortness of breath.  Patient denies any history of MI.  Patient able to tolerate 4 METS.  EKG with no ischemic changes noted.  Chest x-ray done with no acute infiltrate.  Patient with sats of 100% on room  air.  Patient started on incruse to optimize pulmonary function as patient with history of COPD.  Duo nebs as needed.  Patient hesitant to use inhalers. Patient with history of COPD however not O2 dependent.  Per  Lyndel Safe perioperative cardiac risk patient is an estimated risk of 0.32% for perioperative MI..  Continue IV fluids.  Supportive care.  Continue IV antibiotics.  Dulcolax suppository given to patient and MiraLAX dose increased to clean patient in anticipation of surgery per general surgery.  Patient however with no bowel movements.  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  Patient currently on TPN per general surgery.  GI was following however has signed off.  General surgery following and appreciate the input and recommendations.   2.  Hypokalemia Likely secondary to GI losses.  Potassium being repleted per pharmacy protocol as patient on TPN.   3.  COPD Stable.  Continue to incruse, duo nebs as needed.  4.  Oral thrush Continue nystatin swish and swallow.  Continue IV Diflucan.  5.  Severe malnutrition Prealbumin noted to be 6.5 on 03/18/2019.  Patient started on TPN per general surgery.   DVT prophylaxis: SCDs Code Status: Full Family Communication: Patient drowsy.  Patient in PACU. Disposition Plan Discharge when okay with general surgery.   Consultants:   Gastroenterology: Dr. Tarri Glenn 03/16/2019  General surgery: Dr. Dema Severin 03/16/2019  Procedures:   Acute abdominal series 03/15/2019  Laparoscopy and mobilization of splenic flexure: Laparotomy and sigmoid colectomy Jeanette Caprice procedure) with end colostomy per Dr. Hassell Done 03/22/2019  PICC line 03/21/2019  Antimicrobials:   IV Zosyn 03/15/2019   Subjective: Patient in PACU.  Just returned from the OR.  Drowsy.  Moaning in pain.    Objective: Vitals:   03/22/19 1138 03/22/19 1150 03/22/19 1549 03/22/19 1600  BP: (!) 138/56  (!) 148/81 (!) 157/74  Pulse: (!) 58  82   Resp: 16  (!) 24   Temp: 98.4 F (36.9 C)  98 F (36.7 C)     TempSrc: Oral     SpO2: 99%  100%   Weight:  47 kg    Height:  5\' 2"  (1.575 m)      Intake/Output Summary (Last 24 hours) at 03/22/2019 1640 Last data filed at 03/22/2019 1539 Gross per 24 hour  Intake 1482.05 ml  Output 1025 ml  Net 457.05 ml   Filed Weights   03/21/19 0924 03/22/19 0500 03/22/19 1150  Weight: 47.4 kg 47 kg 47 kg    Examination:  General exam: Drowsy.  In PACU. Respiratory system: Lungs clear to auscultation bilaterally anterior lung fields.  No wheezes, no crackles, no rhonchi.  Normal respiratory effort. Cardiovascular system: Regular rate rhythm no murmurs rubs or gallops.  No JVD.  No lower extremity edema.  Gastrointestinal system: Abdomen is soft, hypoactive bowel sounds, ostomy pouch in place, honeycomb dressing intact.  Diffuse tenderness to palpation.   Central nervous system: In PACU.  Drowsy.. No focal neurological deficits. Extremities: Symmetric 5 x 5 power. Skin: No rashes, lesions or ulcers Psychiatry: Judgement and insight appear normal. Mood & affect appropriate.     Data Reviewed: I have personally reviewed following labs and imaging studies  CBC: Recent Labs  Lab 03/17/19 0333 03/18/19 0312 03/19/19 0453 03/20/19 0406 03/21/19 0258 03/22/19 0300  WBC 4.8 4.6 5.7 6.1 5.9 5.2  NEUTROABS  2.4  --  3.2 3.4 3.8 2.6  HGB 10.1* 10.1* 10.9* 10.9* 13.6 10.8*  HCT 31.7* 31.6* 34.4* 34.8* 41.8 33.5*  MCV 99.7 99.1 98.9 100.0 97.7 97.7  PLT 156 168 175 173 221 0000000   Basic Metabolic Panel: Recent Labs  Lab 03/18/19 0312 03/18/19 1151 03/19/19 0453 03/20/19 0406 03/21/19 0258 03/22/19 0300  NA 139 140 140 136 135 135  K 2.6* 2.9* 2.6* 3.0* 4.4 3.4*  CL 109 105 101 101 101 101  CO2 25 24 28 24 22 25   GLUCOSE 107* 83 81 69* 86 111*  BUN <5* <5* <5* <5* 6* 8  CREATININE 0.42* 0.49 0.53 0.45 0.50 0.50  CALCIUM 7.7* 8.0* 8.0* 8.0* 8.6* 8.0*  MG 1.8  --  2.0 1.9 2.2 2.0  PHOS  --   --   --   --  3.8 3.4   GFR: Estimated Creatinine  Clearance: 38.8 mL/min (by C-G formula based on SCr of 0.5 mg/dL). Liver Function Tests: Recent Labs  Lab 03/15/19 1940 03/22/19 0300  AST 16 13*  ALT 7 6  ALKPHOS 48 35*  BILITOT 1.1 0.9  PROT 7.0 5.5*  ALBUMIN 3.4* 2.5*   No results for input(s): LIPASE, AMYLASE in the last 168 hours. No results for input(s): AMMONIA in the last 168 hours. Coagulation Profile: No results for input(s): INR, PROTIME in the last 168 hours. Cardiac Enzymes: No results for input(s): CKTOTAL, CKMB, CKMBINDEX, TROPONINI in the last 168 hours. BNP (last 3 results) No results for input(s): PROBNP in the last 8760 hours. HbA1C: No results for input(s): HGBA1C in the last 72 hours. CBG: Recent Labs  Lab 03/17/19 1613 03/21/19 1733 03/22/19 0039 03/22/19 0607  GLUCAP 100* 85 101* 103*   Lipid Profile: Recent Labs    03/22/19 0300  TRIG 91   Thyroid Function Tests: No results for input(s): TSH, T4TOTAL, FREET4, T3FREE, THYROIDAB in the last 72 hours. Anemia Panel: No results for input(s): VITAMINB12, FOLATE, FERRITIN, TIBC, IRON, RETICCTPCT in the last 72 hours. Sepsis Labs: Recent Labs  Lab 03/15/19 1940  LATICACIDVEN 1.5    Recent Results (from the past 240 hour(s))  SARS CORONAVIRUS 2 (TAT 6-24 HRS) Nasopharyngeal Nasopharyngeal Swab     Status: None   Collection Time: 03/15/19 10:22 PM   Specimen: Nasopharyngeal Swab  Result Value Ref Range Status   SARS Coronavirus 2 NEGATIVE NEGATIVE Final    Comment: (NOTE) SARS-CoV-2 target nucleic acids are NOT DETECTED. The SARS-CoV-2 RNA is generally detectable in upper and lower respiratory specimens during the acute phase of infection. Negative results do not preclude SARS-CoV-2 infection, do not rule out co-infections with other pathogens, and should not be used as the sole basis for treatment or other patient management decisions. Negative results must be combined with clinical observations, patient history, and epidemiological  information. The expected result is Negative. Fact Sheet for Patients: SugarRoll.be Fact Sheet for Healthcare Providers: https://www.woods-mathews.com/ This test is not yet approved or cleared by the [place] FDA and  has been authorized for detection and/or diagnosis of SARS-CoV-2 by FDA under an Emergency Use Authorization (EUA). This EUA will remain  in effect (meaning this test can be used) for the duration of the COVID-19 declaration under Section 56 4(b)(1) of the Act, 21 U.S.C. section 360bbb-3(b)(1), unless the authorization is terminated or revoked sooner. Performed at [facility], [place] [address], [place], [place] [address]   Surgical pcr screen     Status: None  Collection Time: 03/21/19 11:56 PM   Specimen: Nasal Mucosa; Nasal Swab  Result Value Ref Range Status   MRSA, PCR NEGATIVE NEGATIVE Final   Staphylococcus aureus NEGATIVE NEGATIVE Final    Comment: (NOTE) The Xpert SA Assay (FDA approved for NASAL specimens in patients 79 years of age and older), is one component of a comprehensive surveillance program. It is not intended to diagnose infection nor to guide or monitor treatment. Performed at [facility], [place] [address] [place], [place] [address]          Radiology Studies: Dg Abd Portable 1v  Result Date: 03/21/2019 CLINICAL DATA:  85 year old admitted 03/15/2019 for chronic recurrent sigmoid diverticulitis. Follow-up possible ileus. EXAM: PORTABLE ABDOMEN - 1 VIEW COMPARISON:  03/20/2019 and earlier, including CT abdomen and pelvis 03/11/2019. FINDINGS: Since yesterday's examination, many of the small bowel loops have now become moderately distended with gas. Gas is present within nondistended transverse colon. No suggestion of free air on the supine image. No visible opaque urinary tract calculi. IMPRESSION: Worsening of small bowel ileus versus developing partial small bowel  obstruction. Electronically Signed   By: Evangeline Dakin M.D.   On: 03/21/2019 14:09   [place] Ekg Site Rite  Result Date: 03/21/2019 If Site Rite image not attached, placement could not be confirmed due to current cardiac rhythm.       Scheduled Meds:  [MAR Hold] bisacodyl  10 mg Rectal Daily   [MAR Hold] enoxaparin (LOVENOX) injection  30 mg Subcutaneous Q24H   fentaNYL       fentaNYL       HYDROmorphone       [MAR Hold] insulin aspart  0-9 Units Subcutaneous Q6H   [MAR Hold] lip balm  1 application Topical BID   [MAR Hold] mometasone-formoterol  2 puff Inhalation BID   [MAR Hold] nystatin  5 mL Oral QID   ondansetron       [MAR Hold] polyethylene glycol  34 g Oral BID   [MAR Hold] sodium chloride flush  10-40 mL Intracatheter Q12H   [MAR Hold] sodium chloride flush  3 mL Intravenous Q12H   [MAR Hold] umeclidinium bromide  1 puff Inhalation Daily   Continuous Infusions:  [MAR Hold] sodium chloride Stopped (03/19/19 0507)   acetaminophen     [MAR Hold] fluconazole (DIFLUCAN) IV 100 mg (03/21/19 1820)   lactated ringers 50 mL/hr at 03/22/19 1218   [MAR Hold] piperacillin-tazobactam (ZOSYN)  IV 3.375 g (03/22/19 0608)   TPN ADULT (ION) 30 mL/hr at 03/22/19 1414   TPN ADULT (ION)       LOS: 7 days    Time spent: 35 minutes    Irine Seal, MD [facility]  If 7PM-7AM, please contact night-coverage [email or web address] 03/22/2019, 4:40 PM

## 2019-03-23 ENCOUNTER — Encounter (HOSPITAL_COMMUNITY): Payer: Self-pay | Admitting: Surgery

## 2019-03-23 DIAGNOSIS — R066 Hiccough: Secondary | ICD-10-CM

## 2019-03-23 LAB — COMPREHENSIVE METABOLIC PANEL
ALT: 6 U/L (ref 0–44)
AST: 14 U/L — ABNORMAL LOW (ref 15–41)
Albumin: 2.4 g/dL — ABNORMAL LOW (ref 3.5–5.0)
Alkaline Phosphatase: 31 U/L — ABNORMAL LOW (ref 38–126)
Anion gap: 9 (ref 5–15)
BUN: 11 mg/dL (ref 8–23)
CO2: 27 mmol/L (ref 22–32)
Calcium: 8 mg/dL — ABNORMAL LOW (ref 8.9–10.3)
Chloride: 102 mmol/L (ref 98–111)
Creatinine, Ser: 0.57 mg/dL (ref 0.44–1.00)
GFR calc Af Amer: >60 mL/min (ref >60)
GFR calc non Af Amer: >60 mL/min (ref >60)
Glucose, Bld: 282 mg/dL — ABNORMAL HIGH (ref 70–99)
Potassium: 4 mmol/L (ref 3.5–5.1)
Sodium: 138 mmol/L (ref 135–145)
Total Bilirubin: 0.5 mg/dL (ref 0.3–1.2)
Total Protein: 5.4 g/dL — ABNORMAL LOW (ref 6.5–8.1)

## 2019-03-23 LAB — MAGNESIUM: Magnesium: 2.2 mg/dL (ref 1.7–2.4)

## 2019-03-23 LAB — CBC
HCT: 35.3 % — ABNORMAL LOW (ref 36.0–46.0)
Hemoglobin: 11.4 g/dL — ABNORMAL LOW (ref 12.0–15.0)
MCH: 31.9 pg (ref 26.0–34.0)
MCHC: 32.3 g/dL (ref 30.0–36.0)
MCV: 98.9 fL (ref 80.0–100.0)
Platelets: 180 10*3/uL (ref 150–400)
RBC: 3.57 MIL/uL — ABNORMAL LOW (ref 3.87–5.11)
RDW: 14.6 % (ref 11.5–15.5)
WBC: 11.6 10*3/uL — ABNORMAL HIGH (ref 4.0–10.5)
nRBC: 0 % (ref 0.0–0.2)

## 2019-03-23 LAB — GLUCOSE, CAPILLARY
Glucose-Capillary: 104 mg/dL — ABNORMAL HIGH (ref 70–99)
Glucose-Capillary: 117 mg/dL — ABNORMAL HIGH (ref 70–99)
Glucose-Capillary: 175 mg/dL — ABNORMAL HIGH (ref 70–99)
Glucose-Capillary: 203 mg/dL — ABNORMAL HIGH (ref 70–99)
Glucose-Capillary: 228 mg/dL — ABNORMAL HIGH (ref 70–99)
Glucose-Capillary: 245 mg/dL — ABNORMAL HIGH (ref 70–99)

## 2019-03-23 LAB — PHOSPHORUS: Phosphorus: 2.5 mg/dL (ref 2.5–4.6)

## 2019-03-23 MED ORDER — MORPHINE SULFATE (PF) 2 MG/ML IV SOLN
2.0000 mg | INTRAVENOUS | Status: DC | PRN
  Administered 2019-03-23 (×3): 2 mg via INTRAVENOUS
  Administered 2019-03-23 – 2019-03-25 (×8): 4 mg via INTRAVENOUS
  Administered 2019-03-26: 2 mg via INTRAVENOUS
  Administered 2019-03-26 (×3): 4 mg via INTRAVENOUS
  Administered 2019-03-26: 15:00:00 2 mg via INTRAVENOUS
  Administered 2019-03-27: 4 mg via INTRAVENOUS
  Administered 2019-03-27: 2 mg via INTRAVENOUS
  Filled 2019-03-23: qty 2
  Filled 2019-03-23: qty 1
  Filled 2019-03-23 (×3): qty 2
  Filled 2019-03-23: qty 1
  Filled 2019-03-23 (×2): qty 2
  Filled 2019-03-23: qty 1
  Filled 2019-03-23 (×3): qty 2
  Filled 2019-03-23: qty 1
  Filled 2019-03-23 (×4): qty 2
  Filled 2019-03-23: qty 1

## 2019-03-23 MED ORDER — SODIUM CHLORIDE 0.9 % IV BOLUS
500.0000 mL | Freq: Once | INTRAVENOUS | Status: AC
  Administered 2019-03-23: 500 mL via INTRAVENOUS

## 2019-03-23 MED ORDER — TRAVASOL 10 % IV SOLN
INTRAVENOUS | Status: AC
  Administered 2019-03-23: 18:00:00 via INTRAVENOUS
  Filled 2019-03-23: qty 540

## 2019-03-23 MED ORDER — SODIUM CHLORIDE 0.9 % IV SOLN
12.5000 mg | Freq: Four times a day (QID) | INTRAVENOUS | Status: DC | PRN
  Administered 2019-03-24: 12.5 mg via INTRAVENOUS
  Filled 2019-03-23 (×3): qty 0.5

## 2019-03-23 MED ORDER — METOCLOPRAMIDE HCL 5 MG/ML IJ SOLN: 5.0000 mg | Freq: Four times a day (QID) | INTRAMUSCULAR | Status: DC | PRN

## 2019-03-23 MED ORDER — INSULIN ASPART 100 UNIT/ML ~~LOC~~ SOLN
0.0000 [IU] | SUBCUTANEOUS | Status: DC
  Administered 2019-03-23: 13:00:00 3 [IU] via SUBCUTANEOUS
  Administered 2019-03-23: 5 [IU] via SUBCUTANEOUS
  Administered 2019-03-24 (×4): 2 [IU] via SUBCUTANEOUS
  Administered 2019-03-25: 08:00:00 3 [IU] via SUBCUTANEOUS
  Administered 2019-03-25: 2 [IU] via SUBCUTANEOUS
  Administered 2019-03-25: 3 [IU] via SUBCUTANEOUS
  Administered 2019-03-25: 2 [IU] via SUBCUTANEOUS
  Administered 2019-03-25: 3 [IU] via SUBCUTANEOUS
  Administered 2019-03-25 – 2019-03-26 (×5): 2 [IU] via SUBCUTANEOUS
  Administered 2019-03-26: 3 [IU] via SUBCUTANEOUS
  Administered 2019-03-27: 2 [IU] via SUBCUTANEOUS

## 2019-03-23 MED ORDER — KETOROLAC TROMETHAMINE 15 MG/ML IJ SOLN
15.0000 mg | Freq: Three times a day (TID) | INTRAMUSCULAR | Status: DC
  Administered 2019-03-23 – 2019-03-25 (×5): 15 mg via INTRAVENOUS
  Filled 2019-03-23 (×5): qty 1

## 2019-03-23 MED ORDER — SODIUM PHOSPHATES 45 MMOLE/15ML IV SOLN
10.0000 mmol | Freq: Once | INTRAVENOUS | Status: AC
  Administered 2019-03-23: 09:00:00 10 mmol via INTRAVENOUS
  Filled 2019-03-23: qty 3.33

## 2019-03-23 NOTE — Progress Notes (Signed)
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 Hassell Done, 03/22/2019 - Remove Foley today - Continue clear liquid diet until bowel function returns Severe malnutrition - Prealbumin 6.5 on 09/05 - TPN  FEN:  CLD, TPN VTE: SCD's, lovenox ID: Zosyn 09/02-09/10, Diflucan 09/05-09/12 Foley:  Removed POD 1 Follow up:  Dr. Hassell Done  DISPO:  Pain control, await return of bowel function, mobilize, IS, PT, continue TPN until patient is tolerating a regular diet   LOS: 8 days    Subjective: CC: Abdominal pain  Patient states severe abdominal pain this morning.  Tramadol not helping.  She was nauseated earlier today but she is feeling better now.  No vomiting.  Objective: Vital signs in last 24 hours: Temp:  [97.3 F (36.3 C)-98.4 F (36.9 C)] 97.7 F (36.5 C) (09/10 0610) Pulse Rate:  [58-95] 83 (09/10 0610) Resp:  [16-24] 17 (09/10 0610) BP: (115-157)/(56-86) 122/73 (09/10 0610) SpO2:  [98 %-100 %] 100 % (09/10 0610) Weight:  [47 kg-50.4 kg] 50.4 kg (09/10 0500) Last BM Date: 03/20/19  Intake/Output from previous day: 09/09 0701 - 09/10 0700 In: 2938.3 [P.O.:210; I.V.:2000.2; IV Piggyback:728.2] Out: 925 [Urine:900; Blood:25] Intake/Output this shift: No intake/output data recorded.  PE: Gen:  Alert, NAD, pleasant, cooperative Pulm: Rate and effort normal Abd: Soft, not distended, hypoactive bowel sounds, midline with  honeycomb in place, LLQ colostomy, stoma is pink, small amount of liquid brown stool and blood in bag.  No gas in bag.  Appropriately tender, no peritonitis Skin: no rashes noted, warm and dry   Anti-infectives: Anti-infectives (From admission, onward)   Start     Dose/Rate Route Frequency Ordered Stop   03/22/19 2200  piperacillin-tazobactam (ZOSYN) IVPB 3.375 g     3.375 g 12.5 mL/hr over 240 Minutes Intravenous Every 8 hours 03/22/19 1725 03/23/19 0224   03/22/19 1115  cefoTEtan (CEFOTAN) 2 g in sodium chloride 0.9 % 100 mL IVPB     2 g 200 mL/hr over 30 Minutes Intravenous On call to O.R. 03/21/19 1829 03/22/19 1320   03/18/19 1900  fluconazole (DIFLUCAN) IVPB 100 mg     100 mg 50 mL/hr over 60 Minutes Intravenous Every 24 hours 03/18/19 1753 03/25/19 1859   03/16/19 0600  piperacillin-tazobactam (ZOSYN) IVPB 3.375 g  Status:  Discontinued     3.375 g 12.5 mL/hr over 240 Minutes Intravenous Every 8 hours 03/15/19 2316 03/22/19 1725   03/15/19 2315  piperacillin-tazobactam (ZOSYN) IVPB 3.375 g     3.375 g 100 mL/hr over 30 Minutes Intravenous NOW 03/15/19 2314 03/16/19 0038      Lab Results:  Recent Labs    03/22/19 0300 03/23/19 0400  WBC 5.2 11.6*  HGB 10.8* 11.4*  HCT 33.5* 35.3*  PLT 188 180   BMET Recent Labs    03/22/19 0300 03/23/19 0400  NA 135 138  K 3.4* 4.0  CL 101 102  CO2 25 27  GLUCOSE 111* 282*  BUN 8 11  CREATININE 0.50 0.57  CALCIUM 8.0* 8.0*  PT/INR No results for input(s): LABPROT, INR in the last 72 hours. CMP     Component Value Date/Time   NA 138 03/23/2019 0400   K 4.0 03/23/2019 0400   CL 102 03/23/2019 0400   CO2 27 03/23/2019 0400   GLUCOSE 282 (H) 03/23/2019 0400   BUN 11 03/23/2019 0400   CREATININE 0.57 03/23/2019 0400   CALCIUM 8.0 (L) 03/23/2019 0400   PROT 5.4 (L) 03/23/2019 0400   ALBUMIN 2.4 (L) 03/23/2019 0400   AST 14 (L) 03/23/2019 0400   ALT 6 03/23/2019 0400   ALKPHOS 31 (L) 03/23/2019 0400   BILITOT 0.5  03/23/2019 0400   GFRNONAA >60 03/23/2019 0400   GFRAA >60 03/23/2019 0400   Lipase     Component Value Date/Time   LIPASE 11.0 11/02/2018 1103    Studies/Results: Dg Abd Portable 1v  Result Date: 03/21/2019 CLINICAL DATA:  85 year old admitted 03/15/2019 for chronic recurrent sigmoid diverticulitis. Follow-up possible ileus. EXAM: PORTABLE ABDOMEN - 1 VIEW COMPARISON:  03/20/2019 and earlier, including CT abdomen and pelvis 03/11/2019. FINDINGS: Since yesterday's examination, many of the small bowel loops have now become moderately distended with gas. Gas is present within nondistended transverse colon. No suggestion of free air on the supine image. No visible opaque urinary tract calculi. IMPRESSION: Worsening of small bowel ileus versus developing partial small bowel obstruction. Electronically Signed   By: Evangeline Dakin M.D.   On: 03/21/2019 14:09   [place] Ekg Site Rite  Result Date: 03/21/2019 If Site Rite image not attached, placement could not be confirmed due to current cardiac rhythm.     Kalman Drape , [facility] Surgery 03/23/2019, 9:19 AM  Pager: [phone] Mon-Wed, Friday 7:00am-4:30pm Thurs 7am-11:30am  Consults: [phone]

## 2019-03-23 NOTE — Progress Notes (Addendum)
Occupational Therapy Re-eval Patient Details Name: Theresa Arnold MRN: [id number] DOB: 02-22-1932 Today's Date: 03/23/2019    History of present illness 85yo female who presented to the ED with abdominal pain. Admitted for chronic/recurrent sigmoid diverticulitis with known abscess and distal sigmoid stricture. 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 Pain and fatigue a limiting factor. 10/10 Faces abdominal pain. Received in recliner requesting to return to bed, looking rather uncomfortable. Pt very guarded of abdominal muscles, fatigued, and with generalized weakness. Pt needing mod - max A with stand pivot transfer, mod A with bed mobility. Nurse entered room during session to give pt pain med. D/c plan updated to SNF for rehab prior to home.    Follow Up Recommendations  SNF;Supervision/Assistance - 24 hour    Equipment Recommendations  None recommended by OT    Recommendations for Other Services      Precautions / Restrictions Precautions Precautions: Fall;Other (comment) Precaution Comments: Pt fearful of falling, abdominal pain due to surgery Restrictions Weight Bearing Restrictions: No       Mobility Bed Mobility Overal bed mobility: Needs Assistance Bed Mobility: Sit to Supine     Supine to sit: HOB elevated;Mod assist Sit to supine: Mod assist;Max assist   General bed mobility comments: assist to advance BLE onto bed. pain a limiting factor. Pt with very guarded movements. utilized bed pad to full advance hips into supine, midline position  Transfers Overall transfer level: Needs assistance Equipment used: Rolling walker (2  wheeled) Transfers: Sit to/from [facility] Sit to Stand: Mod assist;Max assist Stand pivot transfers: Mod assist       General transfer comment: cues for hand placement, mod assist with stand pivot transfer-max cues for technique, sequencing and support for balance. Pt reports abdominal pain limiting mobility. Pt with very guarded movements, extra time.     Balance Overall balance assessment: Needs assistance Sitting-balance support: Bilateral upper extremity supported;Feet supported Sitting balance-Leahy Scale: Fair     Standing balance support: Bilateral upper extremity supported Standing balance-Leahy Scale: Poor Standing balance comment: utilized rw                           ADL either performed or assessed with clinical judgement   ADL Overall ADL's : Needs assistance/impaired                         Toilet Transfer: Maximal assistance;Stand-pivot Toilet Transfer Details (indicate cue type and reason): simulated with recliner to EOB. Assist for all aspects. 10/10 abdominal pain           General ADL Comments: received in recliner looking rather uncomfortable. Requested returning to bed. Had been sitting up about an hour.     Vision       Perception     Praxis      Cognition Arousal/Alertness: Awake/alert Behavior During Therapy: WFL for tasks assessed/performed Overall Cognitive Status: Within Functional Limits for tasks assessed  Shoulder Instructions       General Comments      Pertinent Vitals/ Pain       Pain Assessment: Faces Pain Score: 10-Worst pain ever Faces Pain Scale: Hurts worst Pain Location: abdomen Pain Descriptors / Indicators: Grimacing;Guarding;Moaning Pain Intervention(s): Limited activity within patient's tolerance;Monitored during session;Repositioned;RN gave pain meds during session  Home Living                                           Prior Functioning/Environment              Frequency  Min 2X/week        Progress Toward Goals  OT Goals(current goals can now be found in the care plan section)  Progress towards OT goals: Progressing toward goals  Acute Rehab OT Goals Patient Stated Goal: go home, have less pain; maintain independence, walk around farther OT Goal Formulation: With patient Time For Goal Achievement: 03/24/19 Potential to Achieve Goals: Good ADL Goals Pt Will Perform Lower Body Bathing: with modified independence;sit to/from stand Pt Will Perform Lower Body Dressing: with modified independence;sit to/from stand Pt Will Perform Tub/Shower Transfer: Shower transfer;with modified independence;shower seat  Plan Discharge plan needs to be updated    Co-evaluation                 AM-PAC OT "6 Clicks" Daily Activity     Outcome Measure   Help from another person eating meals?: None Help from another person taking care of personal grooming?: None Help from another person toileting, which includes using toliet, bedpan, or urinal?: A Lot Help from another person bathing (including washing, rinsing, drying)?: A Lot Help from another person to put on and taking off regular upper body clothing?: A Little Help from another person to put on and taking off regular lower body clothing?: A Lot 6 Click Score: 17    End of Session Equipment Utilized During Treatment: Rolling walker;Oxygen  OT Visit Diagnosis: Unsteadiness on feet (R26.81);Pain   Activity Tolerance Patient limited by pain;Patient limited by fatigue   Patient Left in bed;with call bell/phone within reach;with nursing/sitter in room;with SCD's reapplied   Nurse Communication          Time: 1211-1226 OT Time Calculation (min): 15 min  Charges: OT General Charges $OT Visit: 1 Visit OT Evaluation $OT Re-Eval: [place], OT Acute Rehabilitation Services Pager: [phone] Office:  [phone]    Hortencia Pilar 03/23/2019, 12:40 PM

## 2019-03-23 NOTE — Progress Notes (Signed)
Physical Therapy Treatment Patient Details Name: Theresa Arnold MRN: [id number] DOB: 1932-05-26 Today's Date: 03/23/2019    History of Present Illness 85yo female who presented to the ED with abdominal pain. Admitted for chronic/recurrent sigmoid diverticulitis with known abscess and distal sigmoid stricture. 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 Pt is mod assist with bed mobility and stand pivot to chair with RW. Pt required balance support and max cues for sequencing and technique. Prior to surgery pt was ambulating 150 ft with hand held support. PT previously recommended HHPT services; However pt may need SNF depending on progress and pain control. Pt will continue to benefit from skilled PT to maximize mobility and Independence for d.c.  Follow Up Recommendations  Home health PT     Equipment Recommendations  Rolling walker with 5" wheels    Recommendations for Other Services       Precautions / Restrictions Precautions Precautions: Fall;Other (comment) Precaution Comments: Pt fearful of falling, abdominal pain due to surgery Restrictions Weight Bearing Restrictions: No    Mobility  Bed Mobility Overal bed mobility: Needs Assistance Bed Mobility: Supine to Sit     Supine to sit: HOB elevated;Mod assist     General bed mobility comments: use of bed rails, assistance with trunk support due to abdominal pain  Transfers Overall transfer level: Needs assistance Equipment used: Rolling walker (2 wheeled) Transfers: Sit to/from [facility] Sit  to Stand: Min assist Stand pivot transfers: Mod assist       General transfer comment: cues for hand placement, mod assist with stand pivot transfer-max cues for technique, sequencing and support for balance. Pt reports abdominal pain limiting mobility.  Ambulation/Gait             General Gait Details: unable to ambulate secondary to abdominal pain, pt agreeable to pivot to recliner.   Stairs             Wheelchair Mobility    Modified Rankin (Stroke Patients Only)       Balance Overall balance assessment: Needs assistance Sitting-balance support: Bilateral upper extremity supported;Feet supported Sitting balance-Leahy Scale: Fair     Standing balance support: Bilateral upper extremity supported Standing balance-Leahy Scale: Poor                              Cognition Arousal/Alertness: Awake/alert Behavior During Therapy: WFL for tasks assessed/performed Overall Cognitive Status: Within Functional Limits for tasks assessed                                        Exercises General Exercises - Lower Extremity Ankle Circles/Pumps: AROM;Both;10 reps;Supine Quad Sets: AROM;Strengthening;Both;10 reps;Supine Long Arc Quad: AROM;Strengthening;Both;10 reps;Seated Heel Slides: AROM;Strengthening;Both;10 reps;Supine    General Comments        Pertinent Vitals/Pain Pain Assessment: 0-10 Pain Score: 10-Worst pain ever Pain Location: abdomen Pain Descriptors / Indicators: Discomfort Pain Intervention(s): Limited activity within patient's tolerance;Monitored during session    Home Living  Prior Function            PT Goals (current goals can now be found in the care plan section) Progress towards PT goals: Not progressing toward goals - comment(due to recent abd surgery having pain limiting mobility)    Frequency    Min 3X/week      PT Plan Current plan remains appropriate     Co-evaluation              AM-PAC PT "6 Clicks" Mobility   Outcome Measure  Help needed turning from your back to your side while in a flat bed without using bedrails?: A Lot Help needed moving from lying on your back to sitting on the side of a flat bed without using bedrails?: A Lot Help needed moving to and from a bed to a chair (including a wheelchair)?: A Lot Help needed standing up from a chair using your arms (e.g., wheelchair or bedside chair)?: A Lot Help needed to walk in hospital room?: A Lot Help needed climbing 3-5 steps with a railing? : A Lot 6 Click Score: 12    End of Session   Activity Tolerance: Patient limited by pain Patient left: in chair;with call bell/phone within reach Nurse Communication: Mobility status PT Visit Diagnosis: Unsteadiness on feet (R26.81);Muscle weakness (generalized) (M62.81);Pain Pain - part of body: (abdomin)     Time: 1040-1107 PT Time Calculation (min) (ACUTE ONLY): 27 min  Charges:  $Gait Training: 8-22 mins $Therapeutic Exercise: 8-22 mins                     Theodoro Grist, PT   Lelon Mast 03/23/2019, 11:13 AM

## 2019-03-23 NOTE — Progress Notes (Signed)
PHARMACY - ADULT TOTAL PARENTERAL NUTRITION CONSULT NOTE   Pharmacy Consult for TPN Indication: ileus, malnutrition- intolerance to enteral feeding  Patient Measurements: Height: 5\' 2"  (157.5 cm) Weight: 111 lb 1.8 oz (50.4 kg) IBW/kg (Calculated) : 50.1 TPN AdjBW (KG): 47 Body mass index is 20.32 kg/m. Usual Weight:   Insulin Requirements: on sSSI (6 units since rate increased yesterday)  Current Nutrition: TPN  IVF: none  Central access: PICC  TPN start date: 03/21/19  ASSESSMENT                                                                                                          HPI: Patient is an 85 y.o F with hx recurrent diverticulitis presented to the ED on 03/15/19 with c/o abd pain.  Abd x-ray on 9/2 showed findings with suspicion for obstruction at distal colon or ileus. CCS recom possible colectomy this week (9/8-9/11).  Pharmacy is consulted to start TPN on 9/8 for ileus and malnutrition.  Significant events:  9/3: clear liquids 9/4: changed to full liquids 9/9: Laparoscopy and mobilization of the splenic flexure; laparotomy and sigmoid colectomy with end colostomy  Today:   Glucose (goal <150): 228-282  Electrolytes: K 4, mag 2.2, phos trending down 2.5, CorrCa 9.20; CL and CO2 wnl  Renal: scr ok  LFTs: wnl   TGs: 91 (9/9)  Prealbumin: 6.5 (9/5), 5.6 (9/9)  NUTRITIONAL GOALS                                                                                             RD recs (9/8):  Estimated Nutritional Needs:  Kcal:  1500-1700 Protein:  70-80g Fluid:  1.7L/day  Custom TPN at goal rate of 70 ml/hr provides: -  76 g/day protein ( 45  g/L) -  44 g/day Lipid  (26 g/L) -  269  g/day Dextrose (16 %) -  1654 Kcal/day  PLAN                                                                                                                          Now:  - sodium phosphate 10 mmol IV x1  At 1800 today:  continue TPN rate to 50 ml/hr due to elevated  CBGs -  Provides 54 g of protein, 192 g of dextrose, and 31 g of lipids which provides 1181 kCals per day, meeting 78 % of goal kcal and goal AA  Electrolytes in TPN: Standard except K at 60 mEq/L, increase phos to 20 mmol/L; Cl:Ac ratio 1:1  Plan to advance as tolerated to the goal rate.  TPN to contain standard multivitamins and trace elements.  Change to moderate SSI q4h  TPN lab panels on Mondays & Thursdays.  CMET, phos, mag daily through 9/6 since patient is at high risk for refeeding syndrome  F/u daily.  Theresa Arnold P 03/23/2019,7:18 AM

## 2019-03-23 NOTE — Progress Notes (Addendum)
Patient ID: [name], female   DOB: Aug 19, 1931, 85 y.o.   MRN: [id number]                                                                PROGRESS NOTE                                                                                                                                                                                                             Patient Demographics:    Theresa Arnold, is a 85 y.o. female, DOB - 03-Apr-1932, VHQ:[id number]  Admit date - 03/15/2019   Admitting Physician Theresa Quest, MD  Outpatient Primary MD for the patient is Theresa Levins, MD  LOS - 8  Outpatient Specialists:     Chief Complaint  Patient presents with   Abdominal Pain       Brief Narrative    Per Dr. Alison Stalling I Theresa Arnold a 85 y.o.femalewith medical history significant forCOPD, hyperlipidemia, and chronic sigmoid diverticulitis with known abscess and stricture in the distal sigmoid colon who presented to the ED for evaluation of abdominal pain.  Patient follows with GI, Theresa Arnold, for known sigmoid diverticulitis and abscess. Per GI documentation, case was discussed with IR and fluid collection was not considered amenable to IR drainage. She underwent colonoscopy 01/27/2019 which was limited due to presence of stool. Distal sigmoid stricture was seen. She was reportedly referred to general surgery, CCS Theresa Arnold, and has no present plans for surgical intervention.  Patient was seen in the ED on 03/11/2019 at which time a repeat CT abdomen/pelvis with contrast showed an apparent increase in proximal sigmoid colon wall thickening and unchanged 3.5 cm intramural sigmoid colonic wall abscess/collection which was present since 11/02/2018. There was question of low-grade obstruction. She was given a prescription for ciprofloxacin and metronidazole.  Patient stated that she has had continued generalized abdominal pain. She has not had oral intake due to the nausea after eating. She  has been unable to take her home medications including the recently prescribed antibiotics. She stated that she has not had any bowel movements despite use of MiraLAX for several days. She reported a 30 pound weight loss since last year. She has not had any subjective fevers. She reported good urine output without dysuria.  ED  Course: Initial vitals showed BP 110/71, pulse 82, RR 23, temp 97.6 Fahrenheit, SPO2 98% on room air  Labs notable for potassium 3.1, magnesium 2.6, BUN 11, creatinine 0.62, WBC 7.1, hemoglobin 13.8, platelets 200,000.  Abdominal x-ray showed sub-pathologic gaseous distention of small bowel loops and colon which may represent obstruction at the distal colon or ileus.  Patient was given IV fluids, Protonix, Zofran, and the hospitalist service was consulted to admit for further evaluation and management.   Subjective:    Theresa Arnold today has hiccups.  Pt has slight nausea but no emesis.  Not passing gas yet. Has slight generalized abdominal discomfort.  Pt denies fever, chills, constipation, diarrhea, blood in ostomy.   No headache, No chest pain, No new weakness tingling or numbness, No Cough - SOB.    Assessment  & Plan :    Principal Problem:   Sigmoid stricture (HCC) Active Problems:   COPD (chronic obstructive pulmonary disease) (HCC)   Diverticulitis of sigmoid colon   Abnormal CT scan, gastrointestinal tract   Colonic partial obstruction from sigmoid stricture   Polyp of sigmoid colon   Oral thrush   1 chronic/recurrent sigmoid diverticulitis with known abscess and distal sigmoid stricture/perforation Patient with recurrent symptoms.  Patient noted to have some intermittent abdominal pain with weight loss felt secondary to low-grade diverticular stricture.  Patient was being followed by GI who had previously recommended surgery and patient was referred to general surgery.  Patient was advanced to a full liquid diet per general surgery on  03/17/2019 however patient unable to tolerate full liquid diet with some abdominal discomfort.  Patient on MiraLAX 17 g twice daily. General surgery feels patient will benefit from resection given recurrence of her symptoms, weight loss.  Patient initially seemed apprehensive however seems more agreeable to surgery now if needed.  Patient's diet was downgraded to n.p.o. per general surgery who are recommending sigmoid colectomy in the near future. Patient denies any recent chest pain or shortness of breath.  Patient denies any history of MI.  Patient able to tolerate 4 METS.  EKG with no ischemic changes noted.  Chest x-ray done with no acute infiltrate.  Patient with sats of 100% on room air.  Patient started on incruse to optimize pulmonary function as patient with history of COPD.  Duo nebs as needed.  Patient hesitant to use inhalers. Patient with history of COPD however not O2 dependent.  Per Theresa Arnold perioperative cardiac risk patient is an estimated risk of 0.32% for perioperative MI..  Continue IV fluids.  Supportive care.  Continue IV antibiotics.  Dulcolax suppository given to patient and MiraLAX dose increased to clean patient in anticipation of surgery per general surgery.  Patient however with no bowel movements.  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  Patient currently on TPN per general surgery.  GI was following however has signed off.  General surgery following and appreciate the input and recommendations.   2.  Hypokalemia Likely secondary to GI losses.  Potassium being repleted per pharmacy protocol as patient on TPN.   3.  COPD Stable.  Continue to incruse, duo nebs as needed.  4.  Oral thrush Continue nystatin swish and swallow.  Continue IV Diflucan.  5.  Severe  malnutrition Prealbumin noted to be 6.5 on 03/18/2019.  Patient started on TPN per general  surgery.  6. Hiccups Trial of reglan 5mg  iv q6h prn   DVT prophylaxis: SCDs Code Status: Full Family Communication: Patient drowsy.  Patient in PACU. Disposition Plan Discharge when okay with general surgery.   Consultants:   Gastroenterology: Theresa Arnold 03/16/2019  General surgery: Theresa Arnold 03/16/2019  Procedures:   Acute abdominal series 03/15/2019  Laparoscopy and mobilization of splenic flexure: Laparotomy and sigmoid colectomy (Hartmann procedure) with end colostomy per Dr. Daphine Deutscher 03/22/2019  PICC line 03/21/2019  Antimicrobials:   IV Zosyn 03/15/2019      Lab Results  Component Value Date   PLT 180 03/23/2019       Anti-infectives (From admission, onward)   Start     Dose/Rate Route Frequency Ordered Stop   03/22/19 2200  piperacillin-tazobactam (ZOSYN) IVPB 3.375 g     3.375 g 12.5 mL/hr over 240 Minutes Intravenous Every 8 hours 03/22/19 1725 03/23/19 0224   03/22/19 1115  cefoTEtan (CEFOTAN) 2 g in sodium chloride 0.9 % 100 mL IVPB     2 g 200 mL/hr over 30 Minutes Intravenous On call to O.R. 03/21/19 1829 03/22/19 1320   03/18/19 1900  fluconazole (DIFLUCAN) IVPB 100 mg     100 mg 50 mL/hr over 60 Minutes Intravenous Every 24 hours 03/18/19 1753 03/25/19 1859   03/16/19 0600  piperacillin-tazobactam (ZOSYN) IVPB 3.375 g  Status:  Discontinued     3.375 g 12.5 mL/hr over 240 Minutes Intravenous Every 8 hours 03/15/19 2316 03/22/19 1725   03/15/19 2315  piperacillin-tazobactam (ZOSYN) IVPB 3.375 g     3.375 g 100 mL/hr over 30 Minutes Intravenous NOW 03/15/19 2314 03/16/19 0038        Objective:   Vitals:   03/22/19 2053 03/23/19 0013 03/23/19 0500 03/23/19 0610  BP:  (!) 148/80  122/73  Pulse:  86  83  Resp:  17  17  Temp:  (!) 97.3 F (36.3 C)  97.7 F (36.5 C)  TempSrc:  Oral  Oral  SpO2: 100% 100%  100%  Weight:   50.4 kg   Height:        Wt  Readings from Last 3 Encounters:  03/23/19 50.4 kg  03/11/19 46.3 kg  01/27/19 51.7 kg     Intake/Output Summary (Last 24 hours) at 03/23/2019 0709 Last data filed at 03/23/2019 0600 Gross per 24 hour  Intake 2938.31 ml  Output 925 ml  Net 2013.31 ml     Physical Exam  Awake Alert, Oriented X 3, No new F.N deficits, Normal affect [place].AT,PERRAL Supple Neck,No JVD, No cervical lymphadenopathy appriciated.  Symmetrical Chest wall movement, Good air movement bilaterally, CTAB RRR,No Gallops,Rubs or new Murmurs, No Parasternal Heave +ve B.Sounds, Abd Soft, No tenderness, No organomegaly appriciated, No rebound - guarding or rigidity. No Cyanosis, Clubbing or edema, No new Rash or bruise   Colostomy in place,    Data Review:    CBC Recent Labs  Lab 03/17/19 0333  03/19/19 0453 03/20/19 0406 03/21/19 0258 03/22/19 0300 03/23/19 0400  WBC 4.8   < > 5.7 6.1 5.9 5.2 11.6*  HGB 10.1*   < > 10.9* 10.9* 13.6 10.8* 11.4*  HCT 31.7*   < > 34.4* 34.8* 41.8 33.5* 35.3*  PLT 156   < > 175 173 221 188 180  MCV 99.7   < > 98.9 100.0 97.7 97.7 98.9  MCH 31.8   < > 31.3 31.3 31.8 31.5 31.9  MCHC 31.9   < > 31.7 31.3 32.5 32.2 32.3  RDW 14.1   < > 14.2 14.3 14.5 14.5 14.6  LYMPHSABS 1.8  --  1.9 1.9 1.5 1.9  --   MONOABS 0.4  --  0.5 0.6 0.6 0.6  --   EOSABS 0.1  --  0.1 0.1 0.0 0.1  --   BASOSABS 0.0  --  0.1 0.0 0.0 0.0  --    < > = values in this interval not displayed.    Chemistries  Recent Labs  Lab 03/19/19 0453 03/20/19 0406 03/21/19 0258 03/22/19 0300 03/23/19 0400  NA 140 136 135 135 138  K 2.6* 3.0* 4.4 3.4* 4.0  CL 101 101 101 101 102  CO2 28 24 22 25 27   GLUCOSE 81 69* 86 111* 282*  BUN <5* <5* 6* 8 11  CREATININE 0.53 0.45 0.50 0.50 0.57  CALCIUM 8.0* 8.0* 8.6* 8.0* 8.0*  MG 2.0 1.9 2.2 2.0 2.2  AST  --   --   --  13* 14*  ALT  --   --   --  6 6  ALKPHOS  --   --   --  35* 31*  BILITOT  --   --   --  0.9 0.5    ------------------------------------------------------------------------------------------------------------------ Recent Labs    03/22/19 0300  TRIG 91    No results found for: HGBA1C ------------------------------------------------------------------------------------------------------------------ No results for input(s): TSH, T4TOTAL, T3FREE, THYROIDAB in the last 72 hours.  Invalid input(s): FREET3 ------------------------------------------------------------------------------------------------------------------ No results for input(s): VITAMINB12, FOLATE, FERRITIN, TIBC, IRON, RETICCTPCT in the last 72 hours.  Coagulation profile No results for input(s): INR, PROTIME in the last 168 hours.  No results for input(s): DDIMER in the last 72 hours.  Cardiac Enzymes No results for input(s): CKMB, TROPONINI, MYOGLOBIN in the last 168 hours.  Invalid input(s): CK ------------------------------------------------------------------------------------------------------------------ No results found for: BNP  Inpatient Medications  Scheduled Meds:  acetaminophen  650 mg Oral Q6H   docusate sodium  100 mg Oral BID   enoxaparin (LOVENOX) injection  30 mg Subcutaneous Q24H   insulin aspart  0-9 Units Subcutaneous Q6H   lip balm  1 application Topical BID   mometasone-formoterol  2 puff Inhalation BID   nystatin  5 mL Oral QID   sodium chloride flush  10-40 mL Intracatheter Q12H   sodium chloride flush  3 mL Intravenous Q12H   umeclidinium bromide  1 puff Inhalation Daily   Continuous Infusions:  sodium chloride Stopped (03/19/19 0507)   fluconazole (DIFLUCAN) IV 100 mg (03/22/19 1903)   TPN ADULT (ION) 50 mL/hr at 03/22/19 1832   PRN Meds:.sodium chloride, albuterol, alum & mag hydroxide-simeth, guaiFENesin-dextromethorphan, hydrocortisone, hydrocortisone cream, ipratropium-albuterol, magic mouthwash, menthol-cetylpyridinium, methocarbamol, morphine injection,  ondansetron **OR** ondansetron (ZOFRAN) IV, phenol, polyethylene glycol, simethicone, sodium chloride flush, sodium chloride flush, traMADol  Micro Results Recent Results (from the past 240 hour(s))  SARS CORONAVIRUS 2 (TAT 6-24 HRS) Nasopharyngeal Nasopharyngeal Swab     Status: None   Collection Time: 03/15/19 10:22 PM   Specimen: Nasopharyngeal Swab  Result Value Ref Range Status   SARS Coronavirus 2 NEGATIVE NEGATIVE Final    Comment: (NOTE) SARS-CoV-2 target nucleic acids are NOT DETECTED. The SARS-CoV-2 RNA is generally detectable in upper and lower respiratory specimens during the acute phase of infection. Negative results do not preclude SARS-CoV-2 infection, do not rule out co-infections with other pathogens, and should not be used as the sole basis for treatment or other patient management decisions. Negative results must be combined with clinical observations, patient history, and  epidemiological information. The expected result is Negative. Fact Sheet for Patients: HairSlick.no Fact Sheet for Healthcare Providers: quierodirigir.com This test is not yet approved or cleared by the [place] FDA and  has been authorized for detection and/or diagnosis of SARS-CoV-2 by FDA under an Emergency Use Authorization (EUA). This EUA will remain  in effect (meaning this test can be used) for the duration of the COVID-19 declaration under Section 56 4(b)(1) of the Act, 21 U.S.C. section 360bbb-3(b)(1), unless the authorization is terminated or revoked sooner. Performed at [facility], [address] [place], [place] [address]   Surgical pcr screen     Status: None   Collection Time: 03/21/19 11:56 PM   Specimen: Nasal Mucosa; Nasal Swab  Result Value Ref Range Status   MRSA, PCR NEGATIVE NEGATIVE Final   Staphylococcus aureus NEGATIVE NEGATIVE Final    Comment: (NOTE) The Xpert SA Assay (FDA approved for NASAL  specimens in patients 62 years of age and older), is one component of a comprehensive surveillance program. It is not intended to diagnose infection nor to guide or monitor treatment. Performed at [facility], [address], [place], [place] [address]     Radiology Reports Dg Abd 1 View  Result Date: 03/18/2019 CLINICAL DATA:  History of sigmoid diverticulitis with associated small abscess by CT scan 03/11/2019. Possible colon stricture. EXAM: ABDOMEN - 1 VIEW COMPARISON:  Chest and two views abdomen 03/15/2019. CT abdomen and pelvis 03/11/2019. FINDINGS: No evidence of free intraperitoneal air is seen on supine films. There is mild gaseous distention of the colon and scattered small bowel loops, improved since the comparison plain films. There is some gas in the rectum. No abnormal abdominal calcification. No acute bony abnormality. IMPRESSION: Mild gaseous distention of the colon appears improved compared to the most recent exam. Electronically Signed   By: Drusilla Kanner M.D.   On: 03/18/2019 08:27   Ct Abdomen Pelvis W Contrast  Result Date: 03/11/2019 CLINICAL DATA:  85 year old female with acute abdominal and pelvic pain. EXAM: CT ABDOMEN AND PELVIS WITH CONTRAST TECHNIQUE: Multidetector CT imaging of the abdomen and pelvis was performed using the standard protocol following bolus administration of intravenous contrast. CONTRAST:  OMNIPAQUE IOHEXOL 300 MG/ML  SOLN COMPARISON:  12/28/2018 FINDINGS: Lower chest: No acute abnormality. Hepatobiliary: The liver and gallbladder are unremarkable except for small hepatic cysts. No biliary dilatation. Pancreas: Unremarkable Spleen: Unremarkable Adrenals/Urinary Tract: The kidneys, adrenal glands and bladder are unremarkable except for stable small probable LEFT renal cyst. Stomach/Bowel: Wall thickening of the proximal sigmoid colon is noted and appears slightly increased from 12/28/2018 and may represent increasing  diverticulitis. A 3.5 cm intramural collection along the LEFT sigmoid colonic wall is again noted. Mild colonic distension proximal to the sigmoid colon is noted with fluid and gas. There is no evidence of small bowel obstruction. Vascular/Lymphatic: Aortic atherosclerosis. No enlarged abdominal or pelvic lymph nodes. Reproductive: Uterus and bilateral adnexa are unremarkable. Other: No ascites, pneumoperitoneum or new collection. Musculoskeletal: No acute or suspicious bony abnormalities. IMPRESSION: 1. Apparent increase in proximal sigmoid colon wall thickening which may represent flare up of acute diverticulitis at this area. Unchanged 3.5 cm intramural sigmoid colonic wall abscess/collection, which has been present since 11/02/2018. Relative distension of the colon proximal to the sigmoid colon which may represent a low grade obstruction. No evidence of pneumoperitoneum. 2.  Aortic Atherosclerosis (ICD10-I70.0). Electronically Signed   By: Harmon Pier M.D.   On: 03/11/2019 21:15  Dg Abd Acute 2+v W 1v Chest  Result Date: 03/20/2019 CLINICAL DATA:  Abdominal pain in a patient with a history of chronic sigmoid diverticulitis and a small abscess on CT scan 03/11/2019. EXAM: DG ABDOMEN ACUTE W/ 1V CHEST COMPARISON:  CT abdomen and pelvis 03/11/2019. Chest and two views abdomen 03/15/2019 and two views of the abdomen 03/18/2019. FINDINGS: Single-view of the chest demonstrates clear lungs and normal heart size. Trace pleural effusions. No pneumothorax. Atherosclerosis noted. Two views of the abdomen show no free intraperitoneal air. Multiple gas-filled but nondilated loops of small and large bowel are present. No abnormal abdominal calcification or focal bony abnormality. IMPRESSION: Trace bilateral pleural effusions. Multiple gas-filled but nondilated loops of small and large bowel may be due to ileus. Electronically Signed   By: Drusilla Kanner M.D.   On: 03/20/2019 11:15   Dg Abd Acute W/chest  Result  Date: 03/15/2019 CLINICAL DATA:  Left lower quadrant pain. EXAM: DG ABDOMEN ACUTE W/ 1V CHEST COMPARISON:  Abdominal CT March 11, 2019 FINDINGS: Sub pathologic gaseous distension of small bowel loops and colon which may represent a low-grade/intermediate obstruction at the level of the distal colon, or ileus. No evidence of free gas in the abdomen seen radiographically. IMPRESSION: Sub pathologic gaseous distension of small bowel loops and colon which may represent a low-grade/intermediate obstruction at the level of the distal colon, or ileus. Electronically Signed   By: Ted Mcalpine M.D.   On: 03/15/2019 18:37   Dg Abd Portable 1v  Result Date: 03/21/2019 CLINICAL DATA:  85 year old admitted 03/15/2019 for chronic recurrent sigmoid diverticulitis. Follow-up possible ileus. EXAM: PORTABLE ABDOMEN - 1 VIEW COMPARISON:  03/20/2019 and earlier, including CT abdomen and pelvis 03/11/2019. FINDINGS: Since yesterday's examination, many of the small bowel loops have now become moderately distended with gas. Gas is present within nondistended transverse colon. No suggestion of free air on the supine image. No visible opaque urinary tract calculi. IMPRESSION: Worsening of small bowel ileus versus developing partial small bowel obstruction. Electronically Signed   By: Hulan Saas M.D.   On: 03/21/2019 14:09   [place] Ekg Site Rite  Result Date: 03/21/2019 If Site Rite image not attached, placement could not be confirmed due to current cardiac rhythm.   Time Spent in minutes  30   Pearson Grippe M.D on 03/23/2019 at 7:09 AM  Between 7am to 7pm - Pager - [phone]    After 7pm go to [email or web address] - password [facility]  [facility] -  Office  [phone]

## 2019-03-24 DIAGNOSIS — R0902 Hypoxemia: Secondary | ICD-10-CM

## 2019-03-24 LAB — CBC
HCT: 29.5 % — ABNORMAL LOW (ref 36.0–46.0)
Hemoglobin: 9.3 g/dL — ABNORMAL LOW (ref 12.0–15.0)
MCH: 31.5 pg (ref 26.0–34.0)
MCHC: 31.5 g/dL (ref 30.0–36.0)
MCV: 100 fL (ref 80.0–100.0)
Platelets: 160 10*3/uL (ref 150–400)
RBC: 2.95 MIL/uL — ABNORMAL LOW (ref 3.87–5.11)
RDW: 15.3 % (ref 11.5–15.5)
WBC: 14.8 10*3/uL — ABNORMAL HIGH (ref 4.0–10.5)
nRBC: 0 % (ref 0.0–0.2)

## 2019-03-24 LAB — GLUCOSE, CAPILLARY
Glucose-Capillary: 106 mg/dL — ABNORMAL HIGH (ref 70–99)
Glucose-Capillary: 112 mg/dL — ABNORMAL HIGH (ref 70–99)
Glucose-Capillary: 121 mg/dL — ABNORMAL HIGH (ref 70–99)
Glucose-Capillary: 124 mg/dL — ABNORMAL HIGH (ref 70–99)
Glucose-Capillary: 131 mg/dL — ABNORMAL HIGH (ref 70–99)
Glucose-Capillary: 135 mg/dL — ABNORMAL HIGH (ref 70–99)
Glucose-Capillary: 140 mg/dL — ABNORMAL HIGH (ref 70–99)

## 2019-03-24 LAB — COMPREHENSIVE METABOLIC PANEL
ALT: 5 U/L (ref 0–44)
AST: 16 U/L (ref 15–41)
Albumin: 2.2 g/dL — ABNORMAL LOW (ref 3.5–5.0)
Alkaline Phosphatase: 32 U/L — ABNORMAL LOW (ref 38–126)
Anion gap: 8 (ref 5–15)
BUN: 13 mg/dL (ref 8–23)
CO2: 26 mmol/L (ref 22–32)
Calcium: 8.1 mg/dL — ABNORMAL LOW (ref 8.9–10.3)
Chloride: 106 mmol/L (ref 98–111)
Creatinine, Ser: 0.51 mg/dL (ref 0.44–1.00)
GFR calc Af Amer: >60 mL/min (ref >60)
GFR calc non Af Amer: >60 mL/min (ref >60)
Glucose, Bld: 130 mg/dL — ABNORMAL HIGH (ref 70–99)
Potassium: 3.9 mmol/L (ref 3.5–5.1)
Sodium: 140 mmol/L (ref 135–145)
Total Bilirubin: 0.2 mg/dL — ABNORMAL LOW (ref 0.3–1.2)
Total Protein: 5 g/dL — ABNORMAL LOW (ref 6.5–8.1)

## 2019-03-24 LAB — PHOSPHORUS: Phosphorus: 3 mg/dL (ref 2.5–4.6)

## 2019-03-24 LAB — MAGNESIUM: Magnesium: 2.2 mg/dL (ref 1.7–2.4)

## 2019-03-24 MED ORDER — TRAVASOL 10 % IV SOLN
INTRAVENOUS | Status: AC
  Administered 2019-03-24: 18:00:00 via INTRAVENOUS
  Filled 2019-03-24: qty 756

## 2019-03-24 NOTE — Anesthesia Postprocedure Evaluation (Signed)
Anesthesia Post Note  Patient: Theresa Arnold  Procedure(s) Performed: LAPAROSCOPY, SPLENIC FLEXURE MOBILIZATION WITH OPEN HARTMANN PROCEDURE (N/A Abdomen)     Patient location during evaluation: PACU Anesthesia Type: General Level of consciousness: sedated and awake Pain management: pain level controlled Vital Signs Assessment: post-procedure vital signs reviewed and stable Respiratory status: spontaneous breathing Cardiovascular status: stable Anesthetic complications: yes Anesthetic complication details: PONV   Last Vitals:  Vitals:   03/24/19 1215 03/24/19 1330  BP:  116/77  Pulse:  95  Resp:  18  Temp:  36.8 C  SpO2: (!) 87% 98%    Last Pain:  Vitals:   03/24/19 1330  TempSrc: Oral  PainSc:    Pain Goal: Patients Stated Pain Goal: 3 (03/24/19 1057)                 Huston Foley

## 2019-03-24 NOTE — Progress Notes (Signed)
PHARMACY - ADULT TOTAL PARENTERAL NUTRITION CONSULT NOTE   Pharmacy Consult for TPN Indication: ileus, malnutrition- intolerance to enteral feeding  Patient Measurements: Height: 5\' 2"  (157.5 cm) Weight: 108 lb 14.5 oz (49.4 kg) IBW/kg (Calculated) : 50.1 TPN AdjBW (KG): 47 Body mass index is 19.92 kg/m. Usual Weight:   Insulin Requirements: on sSSI (13 units in 24 hrs)  Current Nutrition: TPN - clear liquid diet  IVF: none  Central access: PICC  TPN start date: 03/21/19  ASSESSMENT                                                                                                          HPI: Patient is an 85 y.o F with hx recurrent diverticulitis presented to the ED on 03/15/19 with c/o abd pain.  Abd x-ray on 9/2 showed findings with suspicion for obstruction at distal colon or ileus. CCS recom possible colectomy this week (9/8-9/11).  Pharmacy is consulted to start TPN on 9/8 for ileus and malnutrition.  Significant events:  9/3: clear liquids 9/4: changed to full liquids 9/9: Laparoscopy and mobilization of the splenic flexure; laparotomy and sigmoid colectomy with end colostomy 9/10: clear liquid diet  Today:   Glucose (goal <150): improved (104-130)  Electrolytes: K 3.9, mag 2.2, phos up 3 (s/p Na phos 10 mmol x1 on 9/10 and increased phos content in TPN), CorrCa 9.51; CL and CO2 wnl  Renal: scr ok  LFTs: wnl   TGs: 91 (9/9)  Prealbumin: 6.5 (9/5), 5.6 (9/9)  NUTRITIONAL GOALS                                                                                             RD recs (9/8):  Estimated Nutritional Needs:  Kcal:  1500-1700 Protein:  70-80g Fluid:  1.7L/day  Custom TPN at goal rate of 70 ml/hr provides: -  76 g/day protein ( 45  g/L) -  44 g/day Lipid  (26 g/L) -  269  g/day Dextrose (16 %) -  1654 Kcal/day  PLAN  At 1800  today:  Increase TPN rate to goal rate 70 ml/hr -  Provides 76 g of protein, 269 g of dextrose, and 44 g of lipids which provides 1654 kCals per day, meeting 100 % of goal kcal and goal AA  Electrolytes in TPN: Standard except K at 60 mEq/L, phos to 20 mmol/L; Cl:Ac ratio 1:1  Plan to advance as tolerated to the goal rate.  TPN to contain standard multivitamins and trace elements.  continue moderate SSI q4h  TPN lab panels on Mondays & Thursdays.  CMET, phos, mag daily through 9/6 since patient is at high risk for refeeding syndrome  F/u daily.  Dia Sitter P 03/24/2019,7:12 AM

## 2019-03-24 NOTE — Care Management Important Message (Signed)
Important Message  Patient Details IM Letter given to [name] SW to present to the Patient Name: Theresa Arnold MRN: [id number] Date of Birth: 03/03/1932   Medicare Important Message Given:  Yes     Kerin Salen 03/24/2019, 10:40 AM

## 2019-03-24 NOTE — Progress Notes (Signed)
Physical Therapy Treatment Patient Details Name: Theresa Arnold MRN: [id number] DOB: [birth year] Today's Date: 03/24/2019    History of Present Illness 85yo female who presented to the ED with abdominal pain. Admitted for chronic/recurrent sigmoid diverticulitis with known abscess and distal sigmoid stricture. PMH cervical radiculopathy and spo S/P laparoscopic and mobilization of the splenic flexure, laparotomy and sigmoid colectomy (Hartmann procedure) with end colostomyndylosis, COPD, chronic diverticulitis with ab S/P laparoscopic and mobilization of the splenic flexure, laparotomy and sigmoid colectomy (Hartmann procedure) with end colostomyscess on 03/22/19    PT Comments    Pt is progressing with mobility. Pt tolerated ambulation today. Pt was min assist with RW with max cues for technique and safety. Pt c/o abdominal pain with mobility. Pt did have a drop in O2 sats from 98% on RA prior to gait to 87% during gait. Pt recovered after 1 min on RA with rest to 96%. Pt will continue to benefit from skilled PT to maximize mobility and independence for d/c.  Follow Up Recommendations  Home health PT     Equipment Recommendations  Rolling walker with 5" wheels    Recommendations for Other Services       Precautions / Restrictions Precautions Precautions: Fall Restrictions Weight Bearing Restrictions: No    Mobility  Bed Mobility   Bed Mobility: Sit to Supine     Supine to sit: HOB elevated Sit to supine: Min assist   General bed mobility comments: use of bed rails, assistance lifting legs back into bed secondary to abd pain  Transfers Overall transfer level: Needs assistance Equipment used: Rolling walker (2 wheeled) Transfers: Sit to/from Stand Sit to Stand: Min assist         General transfer comment: cues for hand placement and increasing forward trunk translation to stand  Ambulation/Gait Ambulation/Gait assistance: Min assist Gait Distance (Feet): 38  Feet Assistive device: Rolling walker (2 wheeled) Gait Pattern/deviations: Step-through pattern;Decreased stride length;Trunk flexed Gait velocity: decr   General Gait Details: max cues for technique, fatigued quickly, o2 sats dropped to 87% on RA, pt moaning at times due to abdominal discomfort. Pt was on RA when i entered the room. O2 sats returned to 95% on RA once seated after 1 min.   Stairs             Wheelchair Mobility    Modified Rankin (Stroke Patients Only)       Balance Overall balance assessment: Needs assistance         Standing balance support: Bilateral upper extremity supported Standing balance-Leahy Scale: Poor Standing balance comment: posterior lean initially transitioning from sit to stand with RW                            Cognition Arousal/Alertness: Awake/alert Behavior During Therapy: WFL for tasks assessed/performed Overall Cognitive Status: Within Functional Limits for tasks assessed                                        Exercises      General Comments        Pertinent Vitals/Pain Pain Assessment: Faces Faces Pain Scale: Hurts even more Pain Location: abdomen Pain Descriptors / Indicators: Discomfort;Guarding Pain Intervention(s): Limited activity within patient's tolerance;Monitored during session;Repositioned    Home Living  Prior Function            PT Goals (current goals can now be found in the care plan section) Progress towards PT goals: Progressing toward goals    Frequency    Min 3X/week      PT Plan Current plan remains appropriate    Co-evaluation              AM-PAC PT "6 Clicks" Mobility   Outcome Measure  Help needed turning from your back to your side while in a flat bed without using bedrails?: A Little Help needed moving from lying on your back to sitting on the side of a flat bed without using bedrails?: A Little Help needed  moving to and from a bed to a chair (including a wheelchair)?: A Little Help needed standing up from a chair using your arms (e.g., wheelchair or bedside chair)?: A Little Help needed to walk in hospital room?: A Little Help needed climbing 3-5 steps with a railing? : A Lot 6 Click Score: 17    End of Session Equipment Utilized During Treatment: Gait belt Activity Tolerance: Patient limited by pain Patient left: in bed;with call bell/phone within reach Nurse Communication: Mobility status PT Visit Diagnosis: Unsteadiness on feet (R26.81);Muscle weakness (generalized) (M62.81);Pain     Time: [id number] PT Time Calculation (min) (ACUTE ONLY): 25 min  Charges:  $Gait Training: 23-37 mins                    Theodoro Grist, PT   Lelon Mast 03/24/2019, 12:23 PM

## 2019-03-24 NOTE — Progress Notes (Signed)
[facility]/Trauma Progress Note  2 Days Post-Op   Assessment/Plan Principal Problem: Sigmoid stricture (HCC) Active Problems: COPD (chronic obstructive pulmonary disease) (HCC) Diverticulitis of sigmoid colon Abnormal CT scan, gastrointestinal tract Colonic partial obstruction from sigmoid stricture Polyp of sigmoid colon Oral thrush  Partial colonic obstruction from sigmoid stricture presumed due to chronic recurrent diverticulitis -S/P laparoscopic and mobilization of the splenic flexure, laparotomy and sigmoid colectomy (Hartmann procedure) with end colostomy, Dr. Hassell Done, 03/22/2019 - Remove Foley today - Continue clear liquid diet until bowel function returns Severe malnutrition -Prealbumin 6.5 on09/05 -TPN  FEN:  F LD,TPN VTE: SCD's, lovenox [id number] 09/02-09/10, Diflucan 09/05-09/12 Foley: Removed POD 2 Follow up: Dr. Hassell Done  DISPO: Pain control, mobilize, IS, PT, continue TPN until patient is tolerating a regular diet   LOS: 9 days    Subjective: CC: Abdominal pain  Patient states mild pain at rest, worse with movement.  She denies nausea or vomiting overnight.  She states there was copious gas in her colostomy this morning.  Bag changed this a.m.  Objective: Vital signs in last 24 hours: Temp:  [97.8 F (36.6 C)-98.3 F (36.8 C)] 98.3 F (36.8 C) (09/11 0428) Pulse Rate:  [66-89] 89 (09/11 0803) Resp:  [14-18] 14 (09/11 0803) BP: (83-125)/(46-67) 125/59 (09/11 0428) SpO2:  [96 %-100 %] 96 % (09/11 0803) Weight:  [49.4 kg] 49.4 kg (09/11 0438) Last BM Date: 03/23/19  Intake/Output from previous day: 09/10 0701 - 09/11 0700 In: 2305.7 [P.O.:120; I.V.:1848.7; IV Piggyback:337] Out: 1950 [Urine:1300; Stool:650] Intake/Output this shift: No intake/output data recorded.  PE: Gen:  Alert, NAD, pleasant, cooperative Pulm: Rate and effort normal Abd: Soft, not distended, good bowel sounds, midline with honeycomb in  place, LLQ colostomy, stoma is pink, new clean bag.  Appropriately tender, no peritonitis Skin: no rashes noted, warm and dry   Anti-infectives: Anti-infectives (From admission, onward)   Start     Dose/Rate Route Frequency Ordered Stop   03/22/19 2200  piperacillin-tazobactam (ZOSYN) IVPB 3.375 g     3.375 g 12.5 mL/hr over 240 Minutes Intravenous Every 8 hours 03/22/19 1725 03/23/19 0224   03/22/19 1115  cefoTEtan (CEFOTAN) 2 g in sodium chloride 0.9 % 100 mL IVPB     2 g 200 mL/hr over 30 Minutes Intravenous On call to O.R. 03/21/19 1829 03/22/19 1320   03/18/19 1900  fluconazole (DIFLUCAN) IVPB 100 mg     100 mg 50 mL/hr over 60 Minutes Intravenous Every 24 hours 03/18/19 1753 03/25/19 1859   03/16/19 0600  piperacillin-tazobactam (ZOSYN) IVPB 3.375 g  Status:  Discontinued     3.375 g 12.5 mL/hr over 240 Minutes Intravenous Every 8 hours 03/15/19 2316 03/22/19 1725   03/15/19 2315  piperacillin-tazobactam (ZOSYN) IVPB 3.375 g     3.375 g 100 mL/hr over 30 Minutes Intravenous NOW 03/15/19 2314 03/16/19 0038      Lab Results:  Recent Labs    03/22/19 0300 03/23/19 0400  WBC 5.2 11.6*  HGB 10.8* 11.4*  HCT 33.5* 35.3*  PLT 188 180   BMET Recent Labs    03/23/19 0400 03/24/19 0340  NA 138 140  K 4.0 3.9  CL 102 106  CO2 27 26  GLUCOSE 282* 130*  BUN 11 13  CREATININE 0.57 0.51  CALCIUM 8.0* 8.1*   PT/INR No results for input(s): LABPROT, INR in the last 72 hours. CMP     Component Value Date/Time   NA 140 03/24/2019 0340   K 3.9 03/24/2019 0340  CL 106 03/24/2019 0340   CO2 26 03/24/2019 0340   GLUCOSE 130 (H) 03/24/2019 0340   BUN 13 03/24/2019 0340   CREATININE 0.51 03/24/2019 0340   CALCIUM 8.1 (L) 03/24/2019 0340   PROT 5.0 (L) 03/24/2019 0340   ALBUMIN 2.2 (L) 03/24/2019 0340   AST 16 03/24/2019 0340   ALT 5 03/24/2019 0340   ALKPHOS 32 (L) 03/24/2019 0340   BILITOT 0.2 (L) 03/24/2019 0340   GFRNONAA >60 03/24/2019 0340   GFRAA >60  03/24/2019 0340   Lipase     Component Value Date/Time   LIPASE 11.0 11/02/2018 1103    Studies/Results: No results found.    Kalman Drape , [facility] 03/24/2019, 8:40 AM  Pager: [phone] Mon-Wed, Friday 7:00am-4:30pm Thurs 7am-11:30am  Consults: [phone]

## 2019-03-24 NOTE — Progress Notes (Signed)
PT refused colace today, saying it chokes her and she can't swallow it.

## 2019-03-24 NOTE — Anesthesia Postprocedure Evaluation (Signed)
Anesthesia Post Note  Patient: Theresa Arnold  Procedure(s) Performed: LAPAROSCOPY, SPLENIC FLEXURE MOBILIZATION WITH OPEN HARTMANN PROCEDURE (N/A Abdomen)     Anesthesia Post Evaluation  Last Vitals:  Vitals:   03/24/19 1215 03/24/19 1330  BP:  116/77  Pulse:  95  Resp:  18  Temp:  36.8 C  SpO2: (!) 87% 98%    Last Pain:  Vitals:   03/24/19 1330  TempSrc: Oral  PainSc:    Pain Goal: Patients Stated Pain Goal: 3 (03/24/19 1057)                 Huston Foley

## 2019-03-24 NOTE — Progress Notes (Signed)
Patient ID: Theresa Arnold, female   DOB: 02-Mar-1932, 85 y.o.   MRN: [id number]                                                                PROGRESS NOTE                                                                                                                                                                                                             Patient Demographics:    Theresa Arnold, is a 85 y.o. female, DOB - 05/17/1932, [id number]  Admit date - 03/15/2019   Admitting Physician Lenore Cordia, MD  Outpatient Primary MD for the patient is Biagio Borg, MD  LOS - 9  Outpatient Specialists:     Chief Complaint  Patient presents with   Abdominal Pain       Brief Narrative  Per Dr. Dondra Prader I Theresa Arnold a 85 y.o.femalewith medical history significant forCOPD, hyperlipidemia, and chronic sigmoid diverticulitis with known abscess and stricture in the distal sigmoid colon who presented to the ED for evaluation of abdominal pain.  Patient follows with GI, Dr. Tarri Glenn, for known sigmoid diverticulitis and abscess. Per GI documentation, case was discussed with IR and fluid collection was not considered amenable to IR drainage. She underwent colonoscopy 01/27/2019 which was limited due to presence of stool. Distal sigmoid stricture was seen. She was reportedly referred to general surgery, [facility] Dr. Dema Severin, and has no present plans for surgical intervention.  Patient was seen in the ED on 03/11/2019 at which time a repeat CT abdomen/pelvis with contrast showed an apparent increase in proximal sigmoid colon wall thickening and unchanged 3.5 cm intramural sigmoid colonic wall abscess/collection which was present since 11/02/2018. There was question of low-grade obstruction. She was given a prescription for ciprofloxacin and metronidazole.  Patient stated that she has had continued generalized abdominal pain. She has not had oral intake due to the nausea after eating. She  has been unable to take her home medications including the recently prescribed antibiotics. She stated that she has not had any bowel movements despite use of MiraLAX for several days. She reported a 30 pound weight loss since last year. She has not had any subjective fevers. She reported good urine output without dysuria.  ED Course: Initial  vitals showed BP 110/71, pulse 82, RR 23, temp 97.6 Fahrenheit, SPO2 98% on room air  Labs notable for potassium 3.1, magnesium 2.6, BUN 11, creatinine 0.62, WBC 7.1, hemoglobin 13.8, platelets 200,000.  Abdominal x-ray showed sub-pathologic gaseous distention of small bowel loops and colon which may represent obstruction at the distal colon or ileus.  Patient was given IV fluids, Protonix, Zofran, and the hospitalist service was consulted to admit for further evaluation and management.   Subjective:    Theresa Arnold today has slight dry cough.  Pt doesn't appear to have been using her incentive spirometer.  Pt doesn't appear to have hiccups, though states has had intermittently.  Pt denies fever, chills, abd pain, diarrhea, blood in ostomy.  Pox noted to be 87% on RA ???, when I check pox myself about 92%  No headache, No chest pain, No new weakness tingling or numbness, No sob   Assessment  & Plan :    Principal Problem:   Sigmoid stricture (HCC) Active Problems:   COPD (chronic obstructive pulmonary disease) (HCC)   Diverticulitis of sigmoid colon   Abnormal CT scan, gastrointestinal tract   Colonic partial obstruction from sigmoid stricture   Polyp of sigmoid colon   Oral thrush   Hiccups   Hypoxia   1.1 chronic/recurrent sigmoid diverticulitis with known abscess and distal sigmoid stricture/perforation Patient with recurrent symptoms. Patient noted to have some intermittent abdominal pain with weight loss felt secondary to low-grade diverticular stricture. Patient was being followed by GI who had previously recommended surgery  and patient was referred to general surgery. Patient was advanced to a full liquid diet per general surgery on 03/17/2019 however patient unable to tolerate full liquid diet with some abdominal discomfort. Patient on MiraLAX 17 g twice daily. General surgery feels patient will benefit from resection given recurrence of her symptoms, weight loss. Patient initially seemed apprehensive however seems more agreeable to surgery now if needed. Patient's diet was downgraded to n.p.o. per general surgery who are recommending sigmoid colectomy in the near future.Patient denies any recent chest pain or shortness of breath. Patient denies any history of MI. Patient able to tolerate 4 METS. EKG with no ischemic changes noted. Chest x-ray done with no acute infiltrate. Patient with sats of 100% on room air. Patient started onincruseto optimize pulmonary function as patient with history of COPD. Duo nebs as needed. Patient hesitant to use inhalers. Patient with history of COPD however not O2 dependent. Per Theresa Arnold perioperative cardiac risk patient is an estimated risk of 0.32% for perioperative MI.. Continue IV fluids. Supportive care. Continue IV antibiotics. Dulcolax suppository given to patient and MiraLAX dose increased to clean patient in anticipation of surgery per general surgery. Patient however with no bowel movements.Patient with no significant improvement and as such patient was taken to the operating room today 03/22/2019 and underwent laparoscopy and mobilization of splenic flexure: Laparotomy and sigmoid colectomy(Hartman's procedure)with end colostomy. Patient currently on TPN per general surgery. GI was following however has signed off. General surgery following and appreciate the input and recommendations.   2. Hypokalemia Likely secondary to GI losses.Potassium being repleted per pharmacy protocol as patient on TPN.  3. COPD Stable.Continue to incruse,duo nebsas  needed.  4. Oral thrush Continue nystatin swish and swallow. Continue IV Diflucan. If improved, can DC in AM  5. Severe malnutrition Prealbumin noted to be 6.5 on 03/18/2019. Patient started on TPN per general surgery.   6. Hiccups Trial of reglan 5mg  iv q6h prn  7. Hypoxia 9/11 Check CXR   8. Leukocytosis  Check cbc w diff in am  DVT prophylaxis:SCDs Code Status:Full Family Communication:Patient drowsy. Patient in PACU. Disposition Plan Discharge when okay with general surgery.   Consultants:  Gastroenterology: Dr. Tarri Glenn 03/16/2019  General surgery: Dr. Dema Severin 03/16/2019  Procedures:  Acute abdominal series 03/15/2019  Laparoscopy and mobilization of splenic flexure: Laparotomy and sigmoid colectomy(Hartmann procedure)with end colostomy per Dr. Hassell Done 03/22/2019  PICC line 03/21/2019  Antimicrobials:   IV Zosyn 03/15/2019 ->         Lab Results  Component Value Date   PLT 160 03/24/2019    Anti-infectives (From admission, onward)   Start     Dose/Rate Route Frequency Ordered Stop   03/22/19 2200  piperacillin-tazobactam (ZOSYN) IVPB 3.375 g     3.375 g 12.5 mL/hr over 240 Minutes Intravenous Every 8 hours 03/22/19 1725 03/23/19 0224   03/22/19 1115  cefoTEtan (CEFOTAN) 2 g in sodium chloride 0.9 % 100 mL IVPB     2 g 200 mL/hr over 30 Minutes Intravenous On call to O.R. 03/21/19 1829 03/22/19 1320   03/18/19 1900  fluconazole (DIFLUCAN) IVPB 100 mg     100 mg 50 mL/hr over 60 Minutes Intravenous Every 24 hours 03/18/19 1753 03/25/19 1859   03/16/19 0600  piperacillin-tazobactam (ZOSYN) IVPB 3.375 g  Status:  Discontinued     3.375 g 12.5 mL/hr over 240 Minutes Intravenous Every 8 hours 03/15/19 2316 03/22/19 1725   03/15/19 2315  piperacillin-tazobactam (ZOSYN) IVPB 3.375 g     3.375 g 100 mL/hr over 30 Minutes Intravenous NOW 03/15/19 2314 03/16/19 0038        Objective:   Vitals:   03/24/19 0428 03/24/19 0438 03/24/19 0803  03/24/19 1215  BP: (!) 125/59     Pulse: 78  89   Resp: 16  14   Temp: 98.3 F (36.8 C)     TempSrc: Oral     SpO2: 99%  96% (!) 87%  Weight:  49.4 kg    Height:        Wt Readings from Last 3 Encounters:  03/24/19 49.4 kg  03/11/19 46.3 kg  01/27/19 51.7 kg     Intake/Output Summary (Last 24 hours) at 03/24/2019 1251 Last data filed at 03/24/2019 1000 Gross per 24 hour  Intake 2162.29 ml  Output 1800 ml  Net 362.29 ml     Physical Exam  Awake Alert, Oriented X 3, No new F.N deficits, Normal affect [place].AT,PERRAL Supple Neck,No JVD, No cervical lymphadenopathy appriciated.  Symmetrical Chest wall movement, Good air movement bilaterally, faint crackles on the right base, no wheezing RRR,No Gallops,Rubs or new Murmurs, No Parasternal Heave +ve B.Sounds, Abd Soft, No tenderness, No organomegaly appriciated, No rebound - guarding or rigidity.  + colostomy  picc in right antecub No Cyanosis, Clubbing or edema, No new Rash or bruise      Data Review:    CBC Recent Labs  Lab 03/19/19 0453 03/20/19 0406 03/21/19 0258 03/22/19 0300 03/23/19 0400 03/24/19 0908  WBC 5.7 6.1 5.9 5.2 11.6* 14.8*  HGB 10.9* 10.9* 13.6 10.8* 11.4* 9.3*  HCT 34.4* 34.8* 41.8 33.5* 35.3* 29.5*  PLT 175 173 221 188 180 160  MCV 98.9 100.0 97.7 97.7 98.9 100.0  MCH 31.3 31.3 31.8 31.5 31.9 31.5  MCHC 31.7 31.3 32.5 32.2 32.3 31.5  RDW 14.2 14.3 14.5 14.5 14.6 15.3  LYMPHSABS 1.9 1.9 1.5 1.9  --   --   MONOABS 0.5  0.6 0.6 0.6  --   --   EOSABS 0.1 0.1 0.0 0.1  --   --   BASOSABS 0.1 0.0 0.0 0.0  --   --     Chemistries  Recent Labs  Lab 03/20/19 0406 03/21/19 0258 03/22/19 0300 03/23/19 0400 03/24/19 0340  NA 136 135 135 138 140  K 3.0* 4.4 3.4* 4.0 3.9  CL 101 101 101 102 106  CO2 24 22 25 27 26   GLUCOSE 69* 86 111* 282* 130*  BUN <5* 6* 8 11 13   CREATININE 0.45 0.50 0.50 0.57 0.51  CALCIUM 8.0* 8.6* 8.0* 8.0* 8.1*  MG 1.9 2.2 2.0 2.2 2.2  AST  --   --  13* 14* 16  ALT   --   --  6 6 5   ALKPHOS  --   --  35* 31* 32*  BILITOT  --   --  0.9 0.5 0.2*   ------------------------------------------------------------------------------------------------------------------ Recent Labs    03/22/19 0300  TRIG 91    No results found for: HGBA1C ------------------------------------------------------------------------------------------------------------------ No results for input(s): TSH, T4TOTAL, T3FREE, THYROIDAB in the last 72 hours.  Invalid input(s): FREET3 ------------------------------------------------------------------------------------------------------------------ No results for input(s): VITAMINB12, FOLATE, FERRITIN, TIBC, IRON, RETICCTPCT in the last 72 hours.  Coagulation profile No results for input(s): INR, PROTIME in the last 168 hours.  No results for input(s): DDIMER in the last 72 hours.  Cardiac Enzymes No results for input(s): CKMB, TROPONINI, MYOGLOBIN in the last 168 hours.  Invalid input(s): CK ------------------------------------------------------------------------------------------------------------------ No results found for: BNP  Inpatient Medications  Scheduled Meds:  acetaminophen  650 mg Oral Q6H   docusate sodium  100 mg Oral BID   enoxaparin (LOVENOX) injection  30 mg Subcutaneous Q24H   insulin aspart  0-15 Units Subcutaneous Q4H   ketorolac  15 mg Intravenous Q8H   lip balm  1 application Topical BID   mometasone-formoterol  2 puff Inhalation BID   nystatin  5 mL Oral QID   sodium chloride flush  10-40 mL Intracatheter Q12H   sodium chloride flush  3 mL Intravenous Q12H   umeclidinium bromide  1 puff Inhalation Daily   Continuous Infusions:  sodium chloride Stopped (03/19/19 0507)   chlorproMAZINE (THORAZINE) IV     fluconazole (DIFLUCAN) IV 100 mg (03/23/19 2002)   TPN ADULT (ION) 50 mL/hr at 03/23/19 1757   TPN ADULT (ION)     PRN Meds:.sodium chloride, albuterol, alum & mag hydroxide-simeth,  chlorproMAZINE (THORAZINE) IV, guaiFENesin-dextromethorphan, hydrocortisone, hydrocortisone cream, ipratropium-albuterol, magic mouthwash, menthol-cetylpyridinium, methocarbamol, metoCLOPramide (REGLAN) injection, morphine injection, ondansetron **OR** ondansetron (ZOFRAN) IV, phenol, polyethylene glycol, simethicone, sodium chloride flush, sodium chloride flush, traMADol  Micro Results Recent Results (from the past 240 hour(s))  SARS CORONAVIRUS 2 (TAT 6-24 HRS) Nasopharyngeal Nasopharyngeal Swab     Status: None   Collection Time: 03/15/19 10:22 PM   Specimen: Nasopharyngeal Swab  Result Value Ref Range Status   SARS Coronavirus 2 NEGATIVE NEGATIVE Final    Comment: (NOTE) SARS-CoV-2 target nucleic acids are NOT DETECTED. The SARS-CoV-2 RNA is generally detectable in upper and lower respiratory specimens during the acute phase of infection. Negative results do not preclude SARS-CoV-2 infection, do not rule out co-infections with other pathogens, and should not be used as the sole basis for treatment or other patient management decisions. Negative results must be combined with clinical observations, patient history, and epidemiological information. The expected result is Negative. Fact Sheet for Patients: SugarRoll.be Fact Sheet for Healthcare Providers: https://www.woods-mathews.com/ This test is  not yet approved or cleared by the [place] and  has been authorized for detection and/or diagnosis of SARS-CoV-2 by FDA under an Emergency Use Authorization (EUA). This EUA will remain  in effect (meaning this test can be used) for the duration of the COVID-19 declaration under Section 56 4(b)(1) of the Act, 21 U.S.C. section 360bbb-3(b)(1), unless the authorization is terminated or revoked sooner. Performed at [facility], [place] [address] [place], [place] [address]   Surgical pcr screen     Status: None   Collection Time: 03/21/19  11:56 PM   Specimen: Nasal Mucosa; Nasal Swab  Result Value Ref Range Status   MRSA, PCR NEGATIVE NEGATIVE Final   Staphylococcus aureus NEGATIVE NEGATIVE Final    Comment: (NOTE) The Xpert SA Assay (FDA approved for NASAL specimens in patients 68 years of age and older), is one component of a comprehensive surveillance program. It is not intended to diagnose infection nor to guide or monitor treatment. Performed at [facility], [place] [address], [place], [place] [address]     Radiology Reports Dg Abd 1 View  Result Date: 03/18/2019 CLINICAL DATA:  History of sigmoid diverticulitis with associated small abscess by CT scan 03/11/2019. Possible colon stricture. EXAM: ABDOMEN - 1 VIEW COMPARISON:  Chest and two views abdomen 03/15/2019. CT abdomen and pelvis 03/11/2019. FINDINGS: No evidence of free intraperitoneal air is seen on supine films. There is mild gaseous distention of the colon and scattered small bowel loops, improved since the comparison plain films. There is some gas in the rectum. No abnormal abdominal calcification. No acute bony abnormality. IMPRESSION: Mild gaseous distention of the colon appears improved compared to the most recent exam. Electronically Signed   By: Inge Rise M.D.   On: 03/18/2019 08:27   Ct Abdomen Pelvis W Contrast  Result Date: 03/11/2019 CLINICAL DATA:  85 year old female with acute abdominal and pelvic pain. EXAM: CT ABDOMEN AND PELVIS WITH CONTRAST TECHNIQUE: Multidetector CT imaging of the abdomen and pelvis was performed using the standard protocol following bolus administration of intravenous contrast. CONTRAST:  12mL OMNIPAQUE IOHEXOL 300 MG/ML  SOLN COMPARISON:  12/28/2018 FINDINGS: Lower chest: No acute abnormality. Hepatobiliary: The liver and gallbladder are unremarkable except for small hepatic cysts. No biliary dilatation. Pancreas: Unremarkable Spleen: Unremarkable Adrenals/Urinary Tract: The kidneys, adrenal glands and  bladder are unremarkable except for stable small probable LEFT renal cyst. Stomach/Bowel: Wall thickening of the proximal sigmoid colon is noted and appears slightly increased from 12/28/2018 and may represent increasing diverticulitis. A 3.5 cm intramural collection along the LEFT sigmoid colonic wall is again noted. Mild colonic distension proximal to the sigmoid colon is noted with fluid and gas. There is no evidence of small bowel obstruction. Vascular/Lymphatic: Aortic atherosclerosis. No enlarged abdominal or pelvic lymph nodes. Reproductive: Uterus and bilateral adnexa are unremarkable. Other: No ascites, pneumoperitoneum or new collection. Musculoskeletal: No acute or suspicious bony abnormalities. IMPRESSION: 1. Apparent increase in proximal sigmoid colon wall thickening which may represent flare up of acute diverticulitis at this area. Unchanged 3.5 cm intramural sigmoid colonic wall abscess/collection, which has been present since 11/02/2018. Relative distension of the colon proximal to the sigmoid colon which may represent a low grade obstruction. No evidence of pneumoperitoneum. 2.  Aortic Atherosclerosis (ICD10-I70.0). Electronically Signed   By: Margarette Canada M.D.   On: 03/11/2019 21:15   Dg Abd Acute 2+v W 1v Chest  Result Date: 03/20/2019 CLINICAL DATA:  Abdominal pain in a patient with a  history of chronic sigmoid diverticulitis and a small abscess on CT scan 03/11/2019. EXAM: DG ABDOMEN ACUTE W/ 1V CHEST COMPARISON:  CT abdomen and pelvis 03/11/2019. Chest and two views abdomen 03/15/2019 and two views of the abdomen 03/18/2019. FINDINGS: Single-view of the chest demonstrates clear lungs and normal heart size. Trace pleural effusions. No pneumothorax. Atherosclerosis noted. Two views of the abdomen show no free intraperitoneal air. Multiple gas-filled but nondilated loops of small and large bowel are present. No abnormal abdominal calcification or focal bony abnormality. IMPRESSION: Trace  bilateral pleural effusions. Multiple gas-filled but nondilated loops of small and large bowel may be due to ileus. Electronically Signed   By: Inge Rise M.D.   On: 03/20/2019 11:15   Dg Abd Acute W/chest  Result Date: 03/15/2019 CLINICAL DATA:  Left lower quadrant pain. EXAM: DG ABDOMEN ACUTE W/ 1V CHEST COMPARISON:  Abdominal CT March 11, 2019 FINDINGS: Sub pathologic gaseous distension of small bowel loops and colon which may represent a low-grade/intermediate obstruction at the level of the distal colon, or ileus. No evidence of free gas in the abdomen seen radiographically. IMPRESSION: Sub pathologic gaseous distension of small bowel loops and colon which may represent a low-grade/intermediate obstruction at the level of the distal colon, or ileus. Electronically Signed   By: Fidela Salisbury M.D.   On: 03/15/2019 18:37   Dg Abd Portable 1v  Result Date: 03/21/2019 CLINICAL DATA:  85 year old admitted 03/15/2019 for chronic recurrent sigmoid diverticulitis. Follow-up possible ileus. EXAM: PORTABLE ABDOMEN - 1 VIEW COMPARISON:  03/20/2019 and earlier, including CT abdomen and pelvis 03/11/2019. FINDINGS: Since yesterday's examination, many of the small bowel loops have now become moderately distended with gas. Gas is present within nondistended transverse colon. No suggestion of free air on the supine image. No visible opaque urinary tract calculi. IMPRESSION: Worsening of small bowel ileus versus developing partial small bowel obstruction. Electronically Signed   By: Evangeline Dakin M.D.   On: 03/21/2019 14:09   [place] Ekg Site Rite  Result Date: 03/21/2019 If Site Rite image not attached, placement could not be confirmed due to current cardiac rhythm.   Time Spent in minutes  30   Jani Gravel M.D on 03/24/2019 at 12:51 PM  Between 7am to 7pm - Pager - [phone]  After 7pm go to [email or web address] - password [facility]  [facility] -  Office  [phone]

## 2019-03-24 NOTE — Consult Note (Addendum)
[place] Nurse ostomy follow up Patient receiving care in [place] Stoma type/location: RUQ colostomy Stomal assessment/size: oval, 1.5 inches; red with slough forming, some edema, flush Peristomal assessment: intact Treatment options for stomal/peristomal skin: barrier ring and convex pouch Output: liquid brown  Ostomy pouching: 1pc.  Education provided: patient shown how to burp the bag, open and close, cleanse around the stoma, size it and place a new pouching system.  Patient will likely need a small ostomy belt. Enrolled patient in [facility] Discharge program: No Patient cannot see without her reading glasses, which are not present in the facility for her use.  Val Riles, RN, MSN, CWOCN, CNS-BC, pager [phone]

## 2019-03-25 DIAGNOSIS — K56609 Unspecified intestinal obstruction, unspecified as to partial versus complete obstruction: Secondary | ICD-10-CM

## 2019-03-25 DIAGNOSIS — R140 Abdominal distension (gaseous): Secondary | ICD-10-CM

## 2019-03-25 LAB — GLUCOSE, CAPILLARY
Glucose-Capillary: 121 mg/dL — ABNORMAL HIGH (ref 70–99)
Glucose-Capillary: 125 mg/dL — ABNORMAL HIGH (ref 70–99)
Glucose-Capillary: 128 mg/dL — ABNORMAL HIGH (ref 70–99)
Glucose-Capillary: 153 mg/dL — ABNORMAL HIGH (ref 70–99)
Glucose-Capillary: 157 mg/dL — ABNORMAL HIGH (ref 70–99)
Glucose-Capillary: 196 mg/dL — ABNORMAL HIGH (ref 70–99)

## 2019-03-25 LAB — COMPREHENSIVE METABOLIC PANEL
ALT: <5 U/L (ref 0–44)
AST: 16 U/L (ref 15–41)
Albumin: 2.2 g/dL — ABNORMAL LOW (ref 3.5–5.0)
Alkaline Phosphatase: 41 U/L (ref 38–126)
Anion gap: 6 (ref 5–15)
BUN: 15 mg/dL (ref 8–23)
CO2: 27 mmol/L (ref 22–32)
Calcium: 8.1 mg/dL — ABNORMAL LOW (ref 8.9–10.3)
Chloride: 103 mmol/L (ref 98–111)
Creatinine, Ser: 0.48 mg/dL (ref 0.44–1.00)
GFR calc Af Amer: >60 mL/min (ref >60)
GFR calc non Af Amer: >60 mL/min (ref >60)
Glucose, Bld: 169 mg/dL — ABNORMAL HIGH (ref 70–99)
Potassium: 4.5 mmol/L (ref 3.5–5.1)
Sodium: 136 mmol/L (ref 135–145)
Total Bilirubin: 0.3 mg/dL (ref 0.3–1.2)
Total Protein: 5.2 g/dL — ABNORMAL LOW (ref 6.5–8.1)

## 2019-03-25 LAB — CBC
HCT: 29.2 % — ABNORMAL LOW (ref 36.0–46.0)
Hemoglobin: 9.3 g/dL — ABNORMAL LOW (ref 12.0–15.0)
MCH: 31.6 pg (ref 26.0–34.0)
MCHC: 31.8 g/dL (ref 30.0–36.0)
MCV: 99.3 fL (ref 80.0–100.0)
Platelets: 175 10*3/uL (ref 150–400)
RBC: 2.94 MIL/uL — ABNORMAL LOW (ref 3.87–5.11)
RDW: 15.3 % (ref 11.5–15.5)
WBC: 12.2 10*3/uL — ABNORMAL HIGH (ref 4.0–10.5)
nRBC: 0 % (ref 0.0–0.2)

## 2019-03-25 LAB — PHOSPHORUS: Phosphorus: 3.6 mg/dL (ref 2.5–4.6)

## 2019-03-25 LAB — MAGNESIUM: Magnesium: 2.2 mg/dL (ref 1.7–2.4)

## 2019-03-25 MED ORDER — TRAVASOL 10 % IV SOLN
INTRAVENOUS | Status: AC
  Administered 2019-03-25: 17:00:00 via INTRAVENOUS
  Filled 2019-03-25: qty 756

## 2019-03-25 NOTE — Progress Notes (Signed)
[facility]/Trauma Progress Note  3 Days Post-Op   Assessment/Plan Principal Problem:   Sigmoid stricture (HCC) Active Problems:   COPD (chronic obstructive pulmonary disease) (HCC)   Diverticulitis of sigmoid colon   Abnormal CT scan, gastrointestinal tract   Colonic partial obstruction from sigmoid stricture   Polyp of sigmoid colon   Oral thrush  Partial colonic obstruction from sigmoid stricture presumed due to chronic recurrent diverticulitis - S/P laparoscopic and mobilization of the splenic flexure, laparotomy and sigmoid colectomy Jeanette Caprice procedure) with end colostomy, Dr. Hassell Done, 03/22/2019 - NPO until ileus resolves Severe malnutrition - Prealbumin 6.5 on 09/05 - TPN  FEN:  CLD, TPN VTE: SCD's, lovenox ID: Zosyn 09/02-09/10, Diflucan 09/05-09/12 Foley:  Removed POD 1 Follow up:  Dr. Hassell Done  DISPO:  Pain control, await return of bowel function, mobilize, IS, PT, continue TPN until patient is tolerating a regular diet   LOS: 10 days    Subjective: CC: Abdominal pain  Patient states she has nausea this am. Vomited once  Objective: Vital signs in last 24 hours: Temp:  [98.3 F (36.8 C)-99.7 F (37.6 C)] 99.7 F (37.6 C) (09/12 0555) Pulse Rate:  [87-98] 98 (09/12 0555) Resp:  [18-20] 20 (09/12 0555) BP: (116-155)/(61-80) 155/80 (09/12 0555) SpO2:  [87 %-99 %] 96 % (09/12 0555) Last BM Date: 03/23/19  Intake/Output from previous day: 09/11 0701 - 09/12 0700 In: 2088.7 [P.O.:420; I.V.:1593.6; IV Piggyback:75.1] Out: 1250 [Urine:850; Emesis/NG output:200; [id number] Intake/Output this shift: No intake/output data recorded.  PE: Gen:  Alert, NAD, pleasant, cooperative Pulm: Rate and effort normal Abd: Soft, not distended, hypoactive bowel sounds, midline with honeycomb in place, LLQ colostomy, stoma is pink, small amount of liquid brown stool and blood in bag.  No gas in bag.  Appropriately tender, no peritonitis Skin: no rashes noted,  warm and dry   Anti-infectives: Anti-infectives (From admission, onward)   Start     Dose/Rate Route Frequency Ordered Stop   03/22/19 2200  piperacillin-tazobactam (ZOSYN) IVPB 3.375 g     3.375 g 12.5 mL/hr over 240 Minutes Intravenous Every 8 hours 03/22/19 1725 03/23/19 0224   03/22/19 1115  cefoTEtan (CEFOTAN) 2 g in sodium chloride 0.9 % 100 mL IVPB     2 g 200 mL/hr over 30 Minutes Intravenous On call to O.R. 03/21/19 1829 03/22/19 1320   03/18/19 1900  fluconazole (DIFLUCAN) IVPB 100 mg     100 mg 50 mL/hr over 60 Minutes Intravenous Every 24 hours 03/18/19 1753 03/24/19 1918   03/16/19 0600  piperacillin-tazobactam (ZOSYN) IVPB 3.375 g  Status:  Discontinued     3.375 g 12.5 mL/hr over 240 Minutes Intravenous Every 8 hours 03/15/19 2316 03/22/19 1725   03/15/19 2315  piperacillin-tazobactam (ZOSYN) IVPB 3.375 g     3.375 g 100 mL/hr over 30 Minutes Intravenous NOW 03/15/19 2314 03/16/19 0038      Lab Results:  Recent Labs    03/24/19 0908 03/25/19 0418  WBC 14.8* 12.2*  HGB 9.3* 9.3*  HCT 29.5* 29.2*  PLT 160 175   BMET Recent Labs    03/24/19 0340 03/25/19 0418  NA 140 136  K 3.9 4.5  CL 106 103  CO2 26 27  GLUCOSE 130* 169*  BUN 13 15  CREATININE 0.51 0.48  CALCIUM 8.1* 8.1*   PT/INR No results for input(s): LABPROT, INR in the last 72 hours. CMP     Component Value Date/Time   NA 136 03/25/2019 0418   K 4.5 03/25/2019  0418   CL 103 03/25/2019 0418   CO2 27 03/25/2019 0418   GLUCOSE 169 (H) 03/25/2019 0418   BUN 15 03/25/2019 0418   CREATININE 0.48 03/25/2019 0418   CALCIUM 8.1 (L) 03/25/2019 0418   PROT 5.2 (L) 03/25/2019 0418   ALBUMIN 2.2 (L) 03/25/2019 0418   AST 16 03/25/2019 0418   ALT <5 03/25/2019 0418   ALKPHOS 41 03/25/2019 0418   BILITOT 0.3 03/25/2019 0418   GFRNONAA >60 03/25/2019 0418   GFRAA >60 03/25/2019 0418   Lipase     Component Value Date/Time   LIPASE 11.0 11/02/2018 1103    Studies/Results: No results  found.    Rosario Adie, MD  [facility]

## 2019-03-25 NOTE — Progress Notes (Signed)
Dr. Tana Coast aware Of pt's sensorium change. Very groggy. VS. Oriented when awake. MD will assess soon.

## 2019-03-25 NOTE — Progress Notes (Addendum)
[facility]                                                                              Patient Demographics  Theresa Arnold, is a 85 y.o. female, DOB - 1931-09-09, ZOX:[id number]  Admit date - 03/15/2019   Admitting Physician Charlsie Quest, MD  Outpatient Primary MD for the patient is Corwin Levins, MD  Outpatient specialists:   LOS - 10  days   Medical records reviewed and are as summarized below:    Chief Complaint  Patient presents with   Abdominal Pain       Brief summary    Patient is a 85 y.o.femalewith COPD, hyperlipidemia, and chronic sigmoid diverticulitis with known abscess and stricture in the distal sigmoid colon who presented to the ED for evaluation of abdominal pain. Patient follows with GI, Dr. Orvan Falconer, for known sigmoid diverticulitis and abscess. Per GI documentation, case was discussed with IR and fluid collection was not considered amenable to IR drainage. She underwent colonoscopy 01/27/2019 which was limited due to presence of stool. Distal sigmoid stricture was seen. She was reportedly referred to general surgery, [facility] Dr. Cliffton Asters, and has no present plans for surgical intervention.  Patient was seen in the ED on 03/11/2019 at which time a repeat CT abdomen/pelvis with contrast showed an apparent increase in proximal sigmoid colon wall thickening and unchanged 3.5 cm intramural sigmoid colonic wall abscess/collection which was present since 11/02/2018. There was question of low-grade obstruction. She was given a prescription for ciprofloxacin and metronidazole.  Patient stated that she has had continued generalized abdominal pain. She has not had oral intake due to the nausea after eating, unable to take her home medications including the recently prescribed antibiotics. She stated that she has not had any bowel movements despite use of MiraLAX for several days. She reported a 30 pound weight loss since last year. She has not had any  subjective fevers. She reported good urine output without dysuria.   Assessment & Plan    Principal Problem: Acute on chronic/recurrent sigmoid diverticulitis with known abscess, distal sigmoid stricture/perforation -Patient had presented with intermittent abdominal pain with weight loss secondary to low-grade diverticular stricture. -Patient was being followed by GI who had referred to general surgery. -On admission, general surgery was consulted, patient underwent laparoscopic and mobilization of the splenic flexure, laparotomy and sigmoid colectomy) Hartmann procedure with end colostomy by Dr. Daphine Deutscher on 9/9. -General surgery following closely, recommended n.p.o. until ileus resolves. -Continue TPN for now until patient is tolerating regular diet, awaiting return of bowel function -Mobilize, PT, incentive spirometry  Acute metabolic encephalopathy -Called by RN to assess in the morning for possible change in the mental status.  On my examination, patient alert and oriented, following all commands, no acute issues identified.  COPD Currently stable, no wheezing, continue duo nebs as needed, Incruse  Oral thrush Continue nystatin swish and swallow, IV Diflucan  Severe malnutrition Prealbumin 6.5 on 9/5, BMI 19, continue TPN  Hiccups On Reglan and Thorazine as needed for hiccups   Code Status: Full CODE STATUS DVT Prophylaxis:  Lovenox  Family Communication: Discussed all imaging results,  lab results, explained to the patient   Disposition Plan: Currently on TPN, DC when cleared by general surgery  Time Spent in minutes 25 minutes  Procedures:   Acute abdominal series 03/15/2019  Laparoscopy and mobilization of splenic flexure: Laparotomy and sigmoid colectomy(Hartmann procedure)with end colostomy per Dr. Daphine Deutscher 03/22/2019  PICC line 03/21/2019   Consultants:   GI General surgery, Dr. Cliffton Asters  Antimicrobials:   Anti-infectives (From admission, onward)   Start      Dose/Rate Route Frequency Ordered Stop   03/22/19 2200  piperacillin-tazobactam (ZOSYN) IVPB 3.375 g     3.375 g 12.5 mL/hr over 240 Minutes Intravenous Every 8 hours 03/22/19 1725 03/23/19 0224   03/22/19 1115  cefoTEtan (CEFOTAN) 2 g in sodium chloride 0.9 % 100 mL IVPB     2 g 200 mL/hr over 30 Minutes Intravenous On call to O.R. 03/21/19 1829 03/22/19 1320   03/18/19 1900  fluconazole (DIFLUCAN) IVPB 100 mg     100 mg 50 mL/hr over 60 Minutes Intravenous Every 24 hours 03/18/19 1753 03/24/19 1918   03/16/19 0600  piperacillin-tazobactam (ZOSYN) IVPB 3.375 g  Status:  Discontinued     3.375 g 12.5 mL/hr over 240 Minutes Intravenous Every 8 hours 03/15/19 2316 03/22/19 1725   03/15/19 2315  piperacillin-tazobactam (ZOSYN) IVPB 3.375 g     3.375 g 100 mL/hr over 30 Minutes Intravenous NOW 03/15/19 2314 03/16/19 0038          Medications  Scheduled Meds:  acetaminophen  650 mg Oral Q6H   docusate sodium  100 mg Oral BID   enoxaparin (LOVENOX) injection  30 mg Subcutaneous Q24H   insulin aspart  0-15 Units Subcutaneous Q4H   lip balm  1 application Topical BID   mometasone-formoterol  2 puff Inhalation BID   nystatin  5 mL Oral QID   sodium chloride flush  10-40 mL Intracatheter Q12H   sodium chloride flush  3 mL Intravenous Q12H   umeclidinium bromide  1 puff Inhalation Daily   Continuous Infusions:  sodium chloride Stopped (03/19/19 0507)   chlorproMAZINE (THORAZINE) IV 12.5 mg (03/24/19 2151)   TPN ADULT (ION) 70 mL/hr at 03/24/19 1804   TPN ADULT (ION)     PRN Meds:.sodium chloride, albuterol, alum & mag hydroxide-simeth, chlorproMAZINE (THORAZINE) IV, guaiFENesin-dextromethorphan, hydrocortisone, hydrocortisone cream, ipratropium-albuterol, magic mouthwash, menthol-cetylpyridinium, methocarbamol, metoCLOPramide (REGLAN) injection, morphine injection, ondansetron **OR** ondansetron (ZOFRAN) IV, phenol, polyethylene glycol, simethicone, sodium chloride  flush, sodium chloride flush, traMADol      Subjective:   Theresa Arnold was seen and examined today.  No acute complaints except hiccups.  On my examination, patient is pleasant, alert and oriented.  Patient denies dizziness, chest pain, shortness of breath, abdominal pain, N/V/D/C, new weakness, numbess, tingling. No acute events overnight.    Objective:   Vitals:   03/24/19 2123 03/25/19 0555 03/25/19 0809 03/25/19 1350  BP: 138/61 (!) 155/80 114/66 (!) 117/56  Pulse: 87 98 97 96  Resp: 18 20 18 16   Temp: 99.1 F (37.3 C) 99.7 F (37.6 C) 99.1 F (37.3 C) 98.7 F (37.1 C)  TempSrc: Oral Oral Oral Oral  SpO2: 99% 96% 99% 99%  Weight:      Height:        Intake/Output Summary (Last 24 hours) at 03/25/2019 1405 Last data filed at 03/25/2019 1300 Gross per 24 hour  Intake 1811.36 ml  Output 1800 ml  Net 11.36 ml     Wt Readings from Last 3 Encounters:  03/24/19 49.4  kg  03/11/19 46.3 kg  01/27/19 51.7 kg     Exam  General: Alert and oriented x 3, NAD  Eyes:   HEENT:  Atraumatic, normocephalic, normal oropharynx  Cardiovascular: S1 S2 auscultated, no murmurs, RRR  Respiratory: Clear to auscultation bilaterally, no wheezing, rales or rhonchi  Gastrointestinal: Soft, nondistended, + bowel sounds, incisions CDI, dressing intact  Ext: no pedal edema bilaterally  Neuro: No new deficits  Musculoskeletal: No digital cyanosis, clubbing  Skin: No rashes  Psych: Normal affect and demeanor, alert and oriented x3    Data Reviewed:  I have personally reviewed following labs and imaging studies  Micro Results Recent Results (from the past 240 hour(s))  SARS CORONAVIRUS 2 (TAT 6-24 HRS) Nasopharyngeal Nasopharyngeal Swab     Status: None   Collection Time: 03/15/19 10:22 PM   Specimen: Nasopharyngeal Swab  Result Value Ref Range Status   SARS Coronavirus 2 NEGATIVE NEGATIVE Final    Comment: (NOTE) SARS-CoV-2 target nucleic acids are NOT DETECTED. The  SARS-CoV-2 RNA is generally detectable in upper and lower respiratory specimens during the acute phase of infection. Negative results do not preclude SARS-CoV-2 infection, do not rule out co-infections with other pathogens, and should not be used as the sole basis for treatment or other patient management decisions. Negative results must be combined with clinical observations, patient history, and epidemiological information. The expected result is Negative. Fact Sheet for Patients: HairSlick.no Fact Sheet for Healthcare Providers: quierodirigir.com This test is not yet approved or cleared by the [place] FDA and  has been authorized for detection and/or diagnosis of SARS-CoV-2 by FDA under an Emergency Use Authorization (EUA). This EUA will remain  in effect (meaning this test can be used) for the duration of the COVID-19 declaration under Section 56 4(b)(1) of the Act, 21 U.S.C. section 360bbb-3(b)(1), unless the authorization is terminated or revoked sooner. Performed at [facility], [address] [place], [place] [address]   Surgical pcr screen     Status: None   Collection Time: 03/21/19 11:56 PM   Specimen: Nasal Mucosa; Nasal Swab  Result Value Ref Range Status   MRSA, PCR NEGATIVE NEGATIVE Final   Staphylococcus aureus NEGATIVE NEGATIVE Final    Comment: (NOTE) The Xpert SA Assay (FDA approved for NASAL specimens in patients 88 years of age and older), is one component of a comprehensive surveillance program. It is not intended to diagnose infection nor to guide or monitor treatment. Performed at [facility], [address], [place], [place] [address]     Radiology Reports Dg Abd 1 View  Result Date: 03/18/2019 CLINICAL DATA:  History of sigmoid diverticulitis with associated small abscess by CT scan 03/11/2019. Possible colon stricture. EXAM: ABDOMEN - 1 VIEW COMPARISON:  Chest and  two views abdomen 03/15/2019. CT abdomen and pelvis 03/11/2019. FINDINGS: No evidence of free intraperitoneal air is seen on supine films. There is mild gaseous distention of the colon and scattered small bowel loops, improved since the comparison plain films. There is some gas in the rectum. No abnormal abdominal calcification. No acute bony abnormality. IMPRESSION: Mild gaseous distention of the colon appears improved compared to the most recent exam. Electronically Signed   By: Drusilla Kanner M.D.   On: 03/18/2019 08:27   Ct Abdomen Pelvis W Contrast  Result Date: 03/11/2019 CLINICAL DATA:  85 year old female with acute abdominal and pelvic pain. EXAM: CT ABDOMEN AND PELVIS WITH CONTRAST TECHNIQUE: Multidetector CT imaging of the abdomen and pelvis  was performed using the standard protocol following bolus administration of intravenous contrast. CONTRAST:  OMNIPAQUE IOHEXOL 300 MG/ML  SOLN COMPARISON:  12/28/2018 FINDINGS: Lower chest: No acute abnormality. Hepatobiliary: The liver and gallbladder are unremarkable except for small hepatic cysts. No biliary dilatation. Pancreas: Unremarkable Spleen: Unremarkable Adrenals/Urinary Tract: The kidneys, adrenal glands and bladder are unremarkable except for stable small probable LEFT renal cyst. Stomach/Bowel: Wall thickening of the proximal sigmoid colon is noted and appears slightly increased from 12/28/2018 and may represent increasing diverticulitis. A 3.5 cm intramural collection along the LEFT sigmoid colonic wall is again noted. Mild colonic distension proximal to the sigmoid colon is noted with fluid and gas. There is no evidence of small bowel obstruction. Vascular/Lymphatic: Aortic atherosclerosis. No enlarged abdominal or pelvic lymph nodes. Reproductive: Uterus and bilateral adnexa are unremarkable. Other: No ascites, pneumoperitoneum or new collection. Musculoskeletal: No acute or suspicious bony abnormalities. IMPRESSION: 1. Apparent increase  in proximal sigmoid colon wall thickening which may represent flare up of acute diverticulitis at this area. Unchanged 3.5 cm intramural sigmoid colonic wall abscess/collection, which has been present since 11/02/2018. Relative distension of the colon proximal to the sigmoid colon which may represent a low grade obstruction. No evidence of pneumoperitoneum. 2.  Aortic Atherosclerosis (ICD10-I70.0). Electronically Signed   By: Harmon Pier M.D.   On: 03/11/2019 21:15   Dg Abd Acute 2+v W 1v Chest  Result Date: 03/20/2019 CLINICAL DATA:  Abdominal pain in a patient with a history of chronic sigmoid diverticulitis and a small abscess on CT scan 03/11/2019. EXAM: DG ABDOMEN ACUTE W/ 1V CHEST COMPARISON:  CT abdomen and pelvis 03/11/2019. Chest and two views abdomen 03/15/2019 and two views of the abdomen 03/18/2019. FINDINGS: Single-view of the chest demonstrates clear lungs and normal heart size. Trace pleural effusions. No pneumothorax. Atherosclerosis noted. Two views of the abdomen show no free intraperitoneal air. Multiple gas-filled but nondilated loops of small and large bowel are present. No abnormal abdominal calcification or focal bony abnormality. IMPRESSION: Trace bilateral pleural effusions. Multiple gas-filled but nondilated loops of small and large bowel may be due to ileus. Electronically Signed   By: Drusilla Kanner M.D.   On: 03/20/2019 11:15   Dg Abd Acute W/chest  Result Date: 03/15/2019 CLINICAL DATA:  Left lower quadrant pain. EXAM: DG ABDOMEN ACUTE W/ 1V CHEST COMPARISON:  Abdominal CT March 11, 2019 FINDINGS: Sub pathologic gaseous distension of small bowel loops and colon which may represent a low-grade/intermediate obstruction at the level of the distal colon, or ileus. No evidence of free gas in the abdomen seen radiographically. IMPRESSION: Sub pathologic gaseous distension of small bowel loops and colon which may represent a low-grade/intermediate obstruction at the level of the  distal colon, or ileus. Electronically Signed   By: Ted Mcalpine M.D.   On: 03/15/2019 18:37   Dg Abd Portable 1v  Result Date: 03/21/2019 CLINICAL DATA:  85 year old admitted 03/15/2019 for chronic recurrent sigmoid diverticulitis. Follow-up possible ileus. EXAM: PORTABLE ABDOMEN - 1 VIEW COMPARISON:  03/20/2019 and earlier, including CT abdomen and pelvis 03/11/2019. FINDINGS: Since yesterday's examination, many of the small bowel loops have now become moderately distended with gas. Gas is present within nondistended transverse colon. No suggestion of free air on the supine image. No visible opaque urinary tract calculi. IMPRESSION: Worsening of small bowel ileus versus developing partial small bowel obstruction. Electronically Signed   By: Hulan Saas M.D.   On: 03/21/2019 14:09   [place] Ekg Site Rite  Result Date:  03/21/2019 If Site Rite image not attached, placement could not be confirmed due to current cardiac rhythm.   Lab Data:  CBC: Recent Labs  Lab 03/19/19 0453 03/20/19 0406 03/21/19 0258 03/22/19 0300 03/23/19 0400 03/24/19 0908 03/25/19 0418  WBC 5.7 6.1 5.9 5.2 11.6* 14.8* 12.2*  NEUTROABS 3.2 3.4 3.8 2.6  --   --   --   HGB 10.9* 10.9* 13.6 10.8* 11.4* 9.3* 9.3*  HCT 34.4* 34.8* 41.8 33.5* 35.3* 29.5* 29.2*  MCV 98.9 100.0 97.7 97.7 98.9 100.0 99.3  PLT 175 173 221 188 180 160 175   Basic Metabolic Panel: Recent Labs  Lab 03/21/19 0258 03/22/19 0300 03/23/19 0400 03/24/19 0340 03/25/19 0418  NA 135 135 138 140 136  K 4.4 3.4* 4.0 3.9 4.5  CL 101 101 102 106 103  CO2 22 25 27 26 27   GLUCOSE 86 111* 282* 130* 169*  BUN 6* 8 11 13 15   CREATININE 0.50 0.50 0.57 0.51 0.48  CALCIUM 8.6* 8.0* 8.0* 8.1* 8.1*  MG 2.2 2.0 2.2 2.2 2.2  PHOS 3.8 3.4 2.5 3.0 3.6   GFR: Estimated Creatinine Clearance: 40.8 mL/min (by C-G formula based on SCr of 0.48 mg/dL). Liver Function Tests: Recent Labs  Lab 03/22/19 0300 03/23/19 0400 03/24/19 0340 03/25/19 0418    AST 13* 14* 16 16  ALT 6 6 5  <5  ALKPHOS 35* 31* 32* 41  BILITOT 0.9 0.5 0.2* 0.3  PROT 5.5* 5.4* 5.0* 5.2*  ALBUMIN 2.5* 2.4* 2.2* 2.2*   No results for input(s): LIPASE, AMYLASE in the last 168 hours. No results for input(s): AMMONIA in the last 168 hours. Coagulation Profile: No results for input(s): INR, PROTIME in the last 168 hours. Cardiac Enzymes: No results for input(s): CKTOTAL, CKMB, CKMBINDEX, TROPONINI in the last 168 hours. BNP (last 3 results) No results for input(s): PROBNP in the last 8760 hours. HbA1C: No results for input(s): HGBA1C in the last 72 hours. CBG: Recent Labs  Lab 03/24/19 2019 03/24/19 2346 03/25/19 0553 03/25/19 0801 03/25/19 1216  GLUCAP 135* 140* 196* 153* 157*   Lipid Profile: No results for input(s): CHOL, HDL, LDLCALC, TRIG, CHOLHDL, LDLDIRECT in the last 72 hours. Thyroid Function Tests: No results for input(s): TSH, T4TOTAL, FREET4, T3FREE, THYROIDAB in the last 72 hours. Anemia Panel: No results for input(s): VITAMINB12, FOLATE, FERRITIN, TIBC, IRON, RETICCTPCT in the last 72 hours. Urine analysis:    Component Value Date/Time   COLORURINE STRAW (A) 03/11/2019 1906   APPEARANCEUR CLEAR 03/11/2019 1906   LABSPEC 1.003 (L) 03/11/2019 1906   PHURINE 8.0 03/11/2019 1906   GLUCOSEU NEGATIVE 03/11/2019 1906   GLUCOSEU NEGATIVE 11/02/2018 1103   HGBUR NEGATIVE 03/11/2019 1906   BILIRUBINUR NEGATIVE 03/11/2019 1906   KETONESUR NEGATIVE 03/11/2019 1906   PROTEINUR NEGATIVE 03/11/2019 1906   UROBILINOGEN 0.2 11/02/2018 1103   NITRITE NEGATIVE 03/11/2019 1906   LEUKOCYTESUR NEGATIVE 03/11/2019 1906     Theresa Arnold M.D. [facility] 03/25/2019, 2:05 PM  Pager: [phone] Between 7am to 7pm - call Pager - [phone]  After 7pm go to [email or web address] - password [email or web address]  Call night coverage person covering after 7pm

## 2019-03-25 NOTE — Progress Notes (Signed)
PHARMACY - ADULT TOTAL PARENTERAL NUTRITION CONSULT NOTE   Pharmacy Consult for TPN Indication: ileus, malnutrition- intolerance to enteral feeding  Patient Measurements: Height: 5\' 2"  (157.5 cm) Weight: 108 lb 14.5 oz (49.4 kg) IBW/kg (Calculated) : 50.1 TPN AdjBW (KG): 47 Body mass index is 19.92 kg/m. Usual Weight:   Insulin Requirements: on sSSI (11 units in 24 hrs)  Current Nutrition: TPN, NPO till ileus resolves  IVF: none  Central access: PICC  TPN start date: 03/21/19  ASSESSMENT                                                                                                          HPI: Patient is an 85 y.o F with hx recurrent diverticulitis presented to the ED on 03/15/19 with c/o abd pain.  Abd x-ray on 9/2 showed findings with suspicion for obstruction at distal colon or ileus. CCS recom possible colectomy this week (9/8-9/11).  Pharmacy is consulted to start TPN on 9/8 for ileus and malnutrition.  Significant events:  9/3: clear liquids 9/4: changed to full liquids 9/9: Laparoscopy and mobilization of the splenic flexure; laparotomy and sigmoid colectomy with end colostomy 9/10: clear liquid diet 9/11: TPN to goal rate 9/12: NPO  Today:   Glucose (goal <150): CBG range 106 - 196  Electrolytes: K 4.5, Mag 2.2, Phos 3.6, CorrCa 9.5; CL and CO2 wnl  Renal: scr ok  LFTs: wnl   TGs: 91 (9/9)  Prealbumin: 6.5 (9/5), 5.6 (9/9)  NUTRITIONAL GOALS                                                                                             RD recs (9/8):  Estimated Nutritional Needs:  Kcal:  1500-1700 Protein:  70-80g Fluid:  1.7L/day  Custom TPN at goal rate of 70 ml/hr provides: -  76 g/day protein ( 45  g/L) -  44 g/day Lipid  (26 g/L) -  269  g/day Dextrose (16 %) -  1654 Kcal/day  PLAN  At 1800 today:  Continue custom TPN at goal  rate of 70 ml/hr -  Provides 76 g of protein, 269 g of dextrose, and 44 g of lipids which provides 1654 kCals per day, meeting 100 % of goal kcal and goal AA  Electrolytes in TPN: Standard except K at 50 mEq/L, Mag to 4 mEq/L, Phos to 20 mmol/L; Cl:Ac ratio 1:1  TPN to contain standard multivitamins and trace elements.  Continue moderate SSI q4h  TPN lab panels on Mondays & Thursdays.  CMET, phos, mag daily through 9/13 since patient is at high risk for refeeding syndrome  F/u daily.  Nyoka Cowden, Chanon Loney L 03/25/2019,8:11 AM

## 2019-03-26 LAB — COMPREHENSIVE METABOLIC PANEL
ALT: 6 U/L (ref 0–44)
AST: 18 U/L (ref 15–41)
Albumin: 2.2 g/dL — ABNORMAL LOW (ref 3.5–5.0)
Alkaline Phosphatase: 45 U/L (ref 38–126)
Anion gap: 7 (ref 5–15)
BUN: 12 mg/dL (ref 8–23)
CO2: 26 mmol/L (ref 22–32)
Calcium: 8.1 mg/dL — ABNORMAL LOW (ref 8.9–10.3)
Chloride: 102 mmol/L (ref 98–111)
Creatinine, Ser: 0.3 mg/dL — ABNORMAL LOW (ref 0.44–1.00)
GFR calc Af Amer: >60 mL/min (ref >60)
GFR calc non Af Amer: >60 mL/min (ref >60)
Glucose, Bld: 131 mg/dL — ABNORMAL HIGH (ref 70–99)
Potassium: 4.4 mmol/L (ref 3.5–5.1)
Sodium: 135 mmol/L (ref 135–145)
Total Bilirubin: 0.4 mg/dL (ref 0.3–1.2)
Total Protein: 5.7 g/dL — ABNORMAL LOW (ref 6.5–8.1)

## 2019-03-26 LAB — GLUCOSE, CAPILLARY
Glucose-Capillary: 117 mg/dL — ABNORMAL HIGH (ref 70–99)
Glucose-Capillary: 117 mg/dL — ABNORMAL HIGH (ref 70–99)
Glucose-Capillary: 133 mg/dL — ABNORMAL HIGH (ref 70–99)
Glucose-Capillary: 142 mg/dL — ABNORMAL HIGH (ref 70–99)
Glucose-Capillary: 150 mg/dL — ABNORMAL HIGH (ref 70–99)
Glucose-Capillary: 151 mg/dL — ABNORMAL HIGH (ref 70–99)

## 2019-03-26 LAB — MAGNESIUM: Magnesium: 2.3 mg/dL (ref 1.7–2.4)

## 2019-03-26 LAB — PHOSPHORUS: Phosphorus: 4.3 mg/dL (ref 2.5–4.6)

## 2019-03-26 MED ORDER — TRAVASOL 10 % IV SOLN
INTRAVENOUS | Status: AC
  Administered 2019-03-26: 17:00:00 via INTRAVENOUS
  Filled 2019-03-26: qty 756

## 2019-03-26 NOTE — Progress Notes (Signed)
[facility]                                                                              Patient Demographics  Theresa Arnold, is a 85 y.o. female, DOB - 1932-01-08, [id number]  Admit date - 03/15/2019   Admitting Physician Lenore Cordia, MD  Outpatient Primary MD for the patient is Biagio Borg, MD  Outpatient specialists:   LOS - 11  days   Medical records reviewed and are as summarized below:    Chief Complaint  Patient presents with  . Abdominal Pain       Brief summary    Patient is a 85 y.o.femalewith COPD, hyperlipidemia, and chronic sigmoid diverticulitis with known abscess and stricture in the distal sigmoid colon who presented to the ED for evaluation of abdominal pain. Patient follows with GI, Dr. Tarri Glenn, for known sigmoid diverticulitis and abscess. Per GI documentation, case was discussed with IR and fluid collection was not considered amenable to IR drainage. She underwent colonoscopy 01/27/2019 which was limited due to presence of stool. Distal sigmoid stricture was seen. She was reportedly referred to general surgery, [facility] Dr. Dema Severin, and has no present plans for surgical intervention.  Patient was seen in the ED on 03/11/2019 at which time a repeat CT abdomen/pelvis with contrast showed an apparent increase in proximal sigmoid colon wall thickening and unchanged 3.5 cm intramural sigmoid colonic wall abscess/collection which was present since 11/02/2018. There was question of low-grade obstruction. She was given a prescription for ciprofloxacin and metronidazole.  Patient stated that she has had continued generalized abdominal pain. She has not had oral intake due to the nausea after eating, unable to take her home medications including the recently prescribed antibiotics. She stated that she has not had any bowel movements despite use of MiraLAX for several days. She reported a 30 pound weight loss since last year. She has not had any  subjective fevers. She reported good urine output without dysuria.   Assessment & Plan    Principal Problem: Acute on chronic/recurrent sigmoid diverticulitis with known abscess, distal sigmoid stricture/perforation -Patient had presented with intermittent abdominal pain with weight loss secondary to low-grade diverticular stricture. Patient was being followed by GI who had referred to general surgery. -On admission, general surgery was consulted, patient underwent laparoscopic and mobilization of the splenic flexure, laparotomy and sigmoid colectomy) Hartmann procedure with end colostomy by Dr. Hassell Done on 9/9. -General surgery  recommended TPN until patient is tolerating regular diet, await return of bowel function -Mobilize, PT, incentive spirometry  Acute metabolic encephalopathy -At baseline mental status, no acute issues, oriented x3  COPD No wheezing, continue duo nebs as needed, Incruse  Oral thrush Completed IV Diflucan.  Continue nystatin swish and swallow  Severe malnutrition Prealbumin 6.5 on 9/5, BMI 19, continue TPN  Hiccups On Reglan and Thorazine as needed for hiccups   Code Status: Full CODE STATUS DVT Prophylaxis:  Lovenox  Family Communication: Discussed all imaging results, lab results, explained to the patient   Disposition Plan: Currently on TPN, DC when cleared by general surgery  Time Spent in minutes 25 minutes  Procedures:  Acute abdominal series 03/15/2019  Laparoscopy and mobilization of splenic flexure: Laparotomy and sigmoid colectomy(Hartmann procedure)with end colostomy per Dr. Hassell Done 03/22/2019  PICC line 03/21/2019   Consultants:   GI General surgery, Dr. Dema Severin  Antimicrobials:   Anti-infectives (From admission, onward)   Start     Dose/Rate Route Frequency Ordered Stop   03/22/19 2200  piperacillin-tazobactam (ZOSYN) IVPB 3.375 g     3.375 g 12.5 mL/hr over 240 Minutes Intravenous Every 8 hours 03/22/19 1725 03/23/19 0224    03/22/19 1115  cefoTEtan (CEFOTAN) 2 g in sodium chloride 0.9 % 100 mL IVPB     2 g 200 mL/hr over 30 Minutes Intravenous On call to O.R. 03/21/19 1829 03/22/19 1320   03/18/19 1900  fluconazole (DIFLUCAN) IVPB 100 mg     100 mg 50 mL/hr over 60 Minutes Intravenous Every 24 hours 03/18/19 1753 03/24/19 1918   03/16/19 0600  piperacillin-tazobactam (ZOSYN) IVPB 3.375 g  Status:  Discontinued     3.375 g 12.5 mL/hr over 240 Minutes Intravenous Every 8 hours 03/15/19 2316 03/22/19 1725   03/15/19 2315  piperacillin-tazobactam (ZOSYN) IVPB 3.375 g     3.375 g 100 mL/hr over 30 Minutes Intravenous NOW 03/15/19 2314 03/16/19 0038         Medications  Scheduled Meds: . acetaminophen  650 mg Oral Q6H  . docusate sodium  100 mg Oral BID  . enoxaparin (LOVENOX) injection  30 mg Subcutaneous Q24H  . insulin aspart  0-15 Units Subcutaneous Q4H  . lip balm  1 application Topical BID  . mometasone-formoterol  2 puff Inhalation BID  . nystatin  5 mL Oral QID  . sodium chloride flush  10-40 mL Intracatheter Q12H  . sodium chloride flush  3 mL Intravenous Q12H  . umeclidinium bromide  1 puff Inhalation Daily   Continuous Infusions: . sodium chloride Stopped (03/19/19 0507)  . chlorproMAZINE (THORAZINE) IV 12.5 mg (03/24/19 2151)  . TPN ADULT (ION) 70 mL/hr at 03/26/19 0559  . TPN ADULT (ION)     PRN Meds:.sodium chloride, albuterol, alum & mag hydroxide-simeth, chlorproMAZINE (THORAZINE) IV, guaiFENesin-dextromethorphan, hydrocortisone, hydrocortisone cream, ipratropium-albuterol, magic mouthwash, menthol-cetylpyridinium, methocarbamol, metoCLOPramide (REGLAN) injection, morphine injection, ondansetron **OR** ondansetron (ZOFRAN) IV, phenol, polyethylene glycol, simethicone, sodium chloride flush, sodium chloride flush, traMADol      Subjective:   Theresa Arnold was seen and examined today.  No complaints, less nausea today.  Still having hiccups but appears to be better from yesterday.   Alert and oriented.  No fevers or chills.  Patient denies dizziness, chest pain, shortness of breath, new weakness, numbess, tingling. No acute events overnight.  Mild abdominal pain.  No vomiting  Objective:   Vitals:   03/25/19 2052 03/26/19 0556 03/26/19 0827 03/26/19 1345  BP: (!) 148/75 140/69  140/71  Pulse: (!) 101 93  91  Resp: 16 16  18   Temp: 98.7 F (37.1 C) 99 F (37.2 C)  98.9 F (37.2 C)  TempSrc: Oral Oral  Oral  SpO2: 98% 98% 98% 97%  Weight:      Height:        Intake/Output Summary (Last 24 hours) at 03/26/2019 1411 Last data filed at 03/26/2019 1000 Gross per 24 hour  Intake 1093.66 ml  Output 3600 ml  Net -2506.34 ml     Wt Readings from Last 3 Encounters:  03/24/19 49.4 kg  03/11/19 46.3 kg  01/27/19 51.7 kg    Physical Exam  General: Alert and oriented x 3, NAD  Eyes:   HEENT:    Cardiovascular: S1 S2 clear, no murmurs, RRR. No pedal edema b/l  Respiratory: CTAB, no wheezing  Gastrointestinal: Soft, hypoactive bowel sounds, LLQ colostomy   Ext: no pedal edema bilaterally  Neuro: no new deficits  Musculoskeletal: No cyanosis, clubbing  Skin: No rashes  Psych: Normal affect and demeanor, alert and oriented x3     Data Reviewed:  I have personally reviewed following labs and imaging studies  Micro Results Recent Results (from the past 240 hour(s))  Surgical pcr screen     Status: None   Collection Time: 03/21/19 11:56 PM   Specimen: Nasal Mucosa; Nasal Swab  Result Value Ref Range Status   MRSA, PCR NEGATIVE NEGATIVE Final   Staphylococcus aureus NEGATIVE NEGATIVE Final    Comment: (NOTE) The Xpert SA Assay (FDA approved for NASAL specimens in patients 77 years of age and older), is one component of a comprehensive surveillance program. It is not intended to diagnose infection nor to guide or monitor treatment. Performed at [facility], [place] [address], [place], [place] [address]     Radiology Reports  Dg Abd 1 View  Result Date: 03/18/2019 CLINICAL DATA:  History of sigmoid diverticulitis with associated small abscess by CT scan 03/11/2019. Possible colon stricture. EXAM: ABDOMEN - 1 VIEW COMPARISON:  Chest and two views abdomen 03/15/2019. CT abdomen and pelvis 03/11/2019. FINDINGS: No evidence of free intraperitoneal air is seen on supine films. There is mild gaseous distention of the colon and scattered small bowel loops, improved since the comparison plain films. There is some gas in the rectum. No abnormal abdominal calcification. No acute bony abnormality. IMPRESSION: Mild gaseous distention of the colon appears improved compared to the most recent exam. Electronically Signed   By: Inge Rise M.D.   On: 03/18/2019 08:27   Ct Abdomen Pelvis W Contrast  Result Date: 03/11/2019 CLINICAL DATA:  85 year old female with acute abdominal and pelvic pain. EXAM: CT ABDOMEN AND PELVIS WITH CONTRAST TECHNIQUE: Multidetector CT imaging of the abdomen and pelvis was performed using the standard protocol following bolus administration of intravenous contrast. CONTRAST:  11mL OMNIPAQUE IOHEXOL 300 MG/ML  SOLN COMPARISON:  12/28/2018 FINDINGS: Lower chest: No acute abnormality. Hepatobiliary: The liver and gallbladder are unremarkable except for small hepatic cysts. No biliary dilatation. Pancreas: Unremarkable Spleen: Unremarkable Adrenals/Urinary Tract: The kidneys, adrenal glands and bladder are unremarkable except for stable small probable LEFT renal cyst. Stomach/Bowel: Wall thickening of the proximal sigmoid colon is noted and appears slightly increased from 12/28/2018 and may represent increasing diverticulitis. A 3.5 cm intramural collection along the LEFT sigmoid colonic wall is again noted. Mild colonic distension proximal to the sigmoid colon is noted with fluid and gas. There is no evidence of small bowel obstruction. Vascular/Lymphatic: Aortic atherosclerosis. No enlarged abdominal or pelvic lymph  nodes. Reproductive: Uterus and bilateral adnexa are unremarkable. Other: No ascites, pneumoperitoneum or new collection. Musculoskeletal: No acute or suspicious bony abnormalities. IMPRESSION: 1. Apparent increase in proximal sigmoid colon wall thickening which may represent flare up of acute diverticulitis at this area. Unchanged 3.5 cm intramural sigmoid colonic wall abscess/collection, which has been present since 11/02/2018. Relative distension of the colon proximal to the sigmoid colon which may represent a low grade obstruction. No evidence of pneumoperitoneum. 2.  Aortic Atherosclerosis (ICD10-I70.0). Electronically Signed   By: Margarette Canada M.D.   On: 03/11/2019 21:15   Dg Abd Acute 2+v W 1v Chest  Result Date: 03/20/2019 CLINICAL DATA:  Abdominal  pain in a patient with a history of chronic sigmoid diverticulitis and a small abscess on CT scan 03/11/2019. EXAM: DG ABDOMEN ACUTE W/ 1V CHEST COMPARISON:  CT abdomen and pelvis 03/11/2019. Chest and two views abdomen 03/15/2019 and two views of the abdomen 03/18/2019. FINDINGS: Single-view of the chest demonstrates clear lungs and normal heart size. Trace pleural effusions. No pneumothorax. Atherosclerosis noted. Two views of the abdomen show no free intraperitoneal air. Multiple gas-filled but nondilated loops of small and large bowel are present. No abnormal abdominal calcification or focal bony abnormality. IMPRESSION: Trace bilateral pleural effusions. Multiple gas-filled but nondilated loops of small and large bowel may be due to ileus. Electronically Signed   By: Inge Rise M.D.   On: 03/20/2019 11:15   Dg Abd Acute W/chest  Result Date: 03/15/2019 CLINICAL DATA:  Left lower quadrant pain. EXAM: DG ABDOMEN ACUTE W/ 1V CHEST COMPARISON:  Abdominal CT March 11, 2019 FINDINGS: Sub pathologic gaseous distension of small bowel loops and colon which may represent a low-grade/intermediate obstruction at the level of the distal colon, or ileus. No  evidence of free gas in the abdomen seen radiographically. IMPRESSION: Sub pathologic gaseous distension of small bowel loops and colon which may represent a low-grade/intermediate obstruction at the level of the distal colon, or ileus. Electronically Signed   By: Fidela Salisbury M.D.   On: 03/15/2019 18:37   Dg Abd Portable 1v  Result Date: 03/21/2019 CLINICAL DATA:  85 year old admitted 03/15/2019 for chronic recurrent sigmoid diverticulitis. Follow-up possible ileus. EXAM: PORTABLE ABDOMEN - 1 VIEW COMPARISON:  03/20/2019 and earlier, including CT abdomen and pelvis 03/11/2019. FINDINGS: Since yesterday's examination, many of the small bowel loops have now become moderately distended with gas. Gas is present within nondistended transverse colon. No suggestion of free air on the supine image. No visible opaque urinary tract calculi. IMPRESSION: Worsening of small bowel ileus versus developing partial small bowel obstruction. Electronically Signed   By: Evangeline Dakin M.D.   On: 03/21/2019 14:09   [place] Ekg Site Rite  Result Date: 03/21/2019 If Site Rite image not attached, placement could not be confirmed due to current cardiac rhythm.   Lab Data:  CBC: Recent Labs  Lab 03/20/19 0406 03/21/19 0258 03/22/19 0300 03/23/19 0400 03/24/19 0908 03/25/19 0418  WBC 6.1 5.9 5.2 11.6* 14.8* 12.2*  NEUTROABS 3.4 3.8 2.6  --   --   --   HGB 10.9* 13.6 10.8* 11.4* 9.3* 9.3*  HCT 34.8* 41.8 33.5* 35.3* 29.5* 29.2*  MCV 100.0 97.7 97.7 98.9 100.0 99.3  PLT 173 221 188 180 160 0000000   Basic Metabolic Panel: Recent Labs  Lab 03/22/19 0300 03/23/19 0400 03/24/19 0340 03/25/19 0418 03/26/19 0501  NA 135 138 140 136 135  K 3.4* 4.0 3.9 4.5 4.4  CL 101 102 106 103 102  CO2 25 27 26 27 26   GLUCOSE 111* 282* 130* 169* 131*  BUN 8 11 13 15 12   CREATININE 0.50 0.57 0.51 0.48 0.30*  CALCIUM 8.0* 8.0* 8.1* 8.1* 8.1*  MG 2.0 2.2 2.2 2.2 2.3  PHOS 3.4 2.5 3.0 3.6 4.3   GFR: Estimated Creatinine  Clearance: 40.8 mL/min (A) (by C-G formula based on SCr of 0.3 mg/dL (L)). Liver Function Tests: Recent Labs  Lab 03/22/19 0300 03/23/19 0400 03/24/19 0340 03/25/19 0418 03/26/19 0501  AST 13* 14* 16 16 18   ALT 6 6 5  <5 6  ALKPHOS 35* 31* 32* 41 45  BILITOT 0.9 0.5 0.2* 0.3 0.4  PROT 5.5* 5.4* 5.0* 5.2* 5.7*  ALBUMIN 2.5* 2.4* 2.2* 2.2* 2.2*   No results for input(s): LIPASE, AMYLASE in the last 168 hours. No results for input(s): AMMONIA in the last 168 hours. Coagulation Profile: No results for input(s): INR, PROTIME in the last 168 hours. Cardiac Enzymes: No results for input(s): CKTOTAL, CKMB, CKMBINDEX, TROPONINI in the last 168 hours. BNP (last 3 results) No results for input(s): PROBNP in the last 8760 hours. HbA1C: No results for input(s): HGBA1C in the last 72 hours. CBG: Recent Labs  Lab 03/25/19 2047 03/25/19 2341 03/26/19 0354 03/26/19 0742 03/26/19 1146  GLUCAP 121* 125* 133* 150* 151*   Lipid Profile: No results for input(s): CHOL, HDL, LDLCALC, TRIG, CHOLHDL, LDLDIRECT in the last 72 hours. Thyroid Function Tests: No results for input(s): TSH, T4TOTAL, FREET4, T3FREE, THYROIDAB in the last 72 hours. Anemia Panel: No results for input(s): VITAMINB12, FOLATE, FERRITIN, TIBC, IRON, RETICCTPCT in the last 72 hours. Urine analysis:    Component Value Date/Time   COLORURINE STRAW (A) 03/11/2019 1906   APPEARANCEUR CLEAR 03/11/2019 1906   LABSPEC 1.003 (L) 03/11/2019 1906   PHURINE 8.0 03/11/2019 1906   GLUCOSEU NEGATIVE 03/11/2019 1906   GLUCOSEU NEGATIVE 11/02/2018 1103   HGBUR NEGATIVE 03/11/2019 1906   BILIRUBINUR NEGATIVE 03/11/2019 1906   KETONESUR NEGATIVE 03/11/2019 1906   PROTEINUR NEGATIVE 03/11/2019 1906   UROBILINOGEN 0.2 11/02/2018 1103   NITRITE NEGATIVE 03/11/2019 1906   LEUKOCYTESUR NEGATIVE 03/11/2019 1906     Ripudeep Rai M.D. [facility] 03/26/2019, 2:11 PM  Pager: [phone] Between 7am to 7pm - call Pager - [phone]   After 7pm go to [email or web address] - password [email or web address]  Call night coverage person covering after 7pm

## 2019-03-26 NOTE — Progress Notes (Signed)
PHARMACY - ADULT TOTAL PARENTERAL NUTRITION CONSULT NOTE   Pharmacy Consult for TPN Indication: ileus, malnutrition- intolerance to enteral feeding  Patient Measurements: Height: 5\' 2"  (157.5 cm) Weight: 108 lb 14.5 oz (49.4 kg) IBW/kg (Calculated) : 50.1 TPN AdjBW (KG): 47 Body mass index is 19.92 kg/m. Usual Weight:   Insulin Requirements: on moderate sSSI q4 hr (19 units in 24 hrs)  Current Nutrition: TPN, NPO till ileus resolves  IVF: none  Central access: PICC  TPN start date: 03/21/19  ASSESSMENT                                                                                                          HPI: Patient is an 85 y.o F with hx recurrent diverticulitis presented to the ED on 03/15/19 with c/o abd pain.  Abd x-ray on 9/2 showed findings with suspicion for obstruction at distal colon or ileus. [facility] recom possible colectomy this week (9/8-9/11).  Pharmacy is consulted to start TPN on 9/8 for ileus and malnutrition.  Significant events:  9/3: clear liquids 9/4: changed to full liquids 9/9: Laparoscopy and mobilization of the splenic flexure; laparotomy and sigmoid colectomy with end colostomy 9/10: clear liquid diet 9/11: TPN to goal rate 9/12: NPO, reduced K, Mag & Phos in TPN 9/13: resume CL diet, reduced Phos further, added insulin  Today:   Glucose (goal <150): CBG range 196 - 125  Electrolytes: K 4.4, Mag 2.3, Phos 4.3, CorrCa 9.5; CL and CO2 wnl  Renal: scr ok  LFTs: wnl   TGs: 91 (9/9)  Prealbumin: 6.5 (9/5), 5.6 (9/9)  NUTRITIONAL GOALS                                                                                             RD recs (9/8):  Estimated Nutritional Needs:  Kcal:  1500-1700 Protein:  70-80g Fluid:  1.7L/day  Custom TPN at goal rate of 70 ml/hr provides: -  76 g/day protein ( 45  g/L) -  44 g/day Lipid  (26 g/L) -  269  g/day Dextrose (16 %) -  1654 Kcal/day  PLAN  At 1800 today:  Continue custom TPN at goal rate of 70 ml/hr -  Provides 76 g of protein, 269 g of dextrose, and 44 g of lipids which provides 1654 kCals per day, meeting 100 % of goal kcal and goal AA  Electrolytes in TPN: Standard except K at 50 mEq/L, Mag to 4 mEq/L, Phos to 15 mmol/L; Cl:Ac ratio 1:1  TPN to contain standard multivitamins and trace elements.  Continue moderate SSI q4h, Add 10 units insulin per bag  TPN lab panels on Mondays & Thursdays.  CMET, phos, mag daily through 9/13 since patient is at high risk for refeeding syndrome  F/u daily.  Nyoka Cowden, Gevin Perea L 03/26/2019,8:19 AM

## 2019-03-26 NOTE — Progress Notes (Signed)
[facility]/Trauma Progress Note  4 Days Post-Op   Assessment/Plan Principal Problem:   Sigmoid stricture (HCC) Active Problems:   COPD (chronic obstructive pulmonary disease) (HCC)   Diverticulitis of sigmoid colon   Abnormal CT scan, gastrointestinal tract   Colonic partial obstruction from sigmoid stricture   Polyp of sigmoid colon   Oral thrush  Partial colonic obstruction from sigmoid stricture presumed due to chronic recurrent diverticulitis - S/P laparoscopic and mobilization of the splenic flexure, laparotomy and sigmoid colectomy Jeanette Caprice procedure) with end colostomy, Dr. Hassell Done, 03/22/2019 - NPO until ileus resolves Severe malnutrition - Prealbumin 6.5 on 09/05 - TPN  FEN:  CLD, TPN VTE: SCD's, lovenox ID: Zosyn 09/02-09/10, Diflucan 09/05-09/12 Foley:  Removed POD 1 Follow up:  Dr. Hassell Done  DISPO:  Pain control, await return of bowel function, mobilize, IS, PT, continue TPN until patient is tolerating a regular diet   LOS: 11 days    Subjective: CC: Abdominal pain  Patient states she less nausea this am.   Objective: Vital signs in last 24 hours: Temp:  [98.7 F (37.1 C)-99.1 F (37.3 C)] 99 F (37.2 C) (09/13 0556) Pulse Rate:  [93-101] 93 (09/13 0556) Resp:  [16-18] 16 (09/13 0556) BP: (114-148)/(56-75) 140/69 (09/13 0556) SpO2:  [98 %-99 %] 98 % (09/13 0556) Last BM Date: 03/25/19  Intake/Output from previous day: 09/12 0701 - 09/13 0700 In: 1546.4 [I.V.:1546.4] Out: 3650 [Urine:3450; Stool:200] Intake/Output this shift: No intake/output data recorded.  PE: Gen:  Alert, NAD, pleasant, cooperative Pulm: Rate and effort normal Abd: Soft, not distended, hypoactive bowel sounds, midline with honeycomb in place, LLQ colostomy, stoma is pink, small amount of liquid brown stool and blood in bag.  No gas in bag.  Appropriately tender, no peritonitis Skin: no rashes noted, warm and dry   Anti-infectives: Anti-infectives (From  admission, onward)   Start     Dose/Rate Route Frequency Ordered Stop   03/22/19 2200  piperacillin-tazobactam (ZOSYN) IVPB 3.375 g     3.375 g 12.5 mL/hr over 240 Minutes Intravenous Every 8 hours 03/22/19 1725 03/23/19 0224   03/22/19 1115  cefoTEtan (CEFOTAN) 2 g in sodium chloride 0.9 % 100 mL IVPB     2 g 200 mL/hr over 30 Minutes Intravenous On call to O.R. 03/21/19 1829 03/22/19 1320   03/18/19 1900  fluconazole (DIFLUCAN) IVPB 100 mg     100 mg 50 mL/hr over 60 Minutes Intravenous Every 24 hours 03/18/19 1753 03/24/19 1918   03/16/19 0600  piperacillin-tazobactam (ZOSYN) IVPB 3.375 g  Status:  Discontinued     3.375 g 12.5 mL/hr over 240 Minutes Intravenous Every 8 hours 03/15/19 2316 03/22/19 1725   03/15/19 2315  piperacillin-tazobactam (ZOSYN) IVPB 3.375 g     3.375 g 100 mL/hr over 30 Minutes Intravenous NOW 03/15/19 2314 03/16/19 0038      Lab Results:  Recent Labs    03/24/19 0908 03/25/19 0418  WBC 14.8* 12.2*  HGB 9.3* 9.3*  HCT 29.5* 29.2*  PLT 160 175   BMET Recent Labs    03/25/19 0418 03/26/19 0501  NA 136 135  K 4.5 4.4  CL 103 102  CO2 27 26  GLUCOSE 169* 131*  BUN 15 12  CREATININE 0.48 0.30*  CALCIUM 8.1* 8.1*   PT/INR No results for input(s): LABPROT, INR in the last 72 hours. CMP     Component Value Date/Time   NA 135 03/26/2019 0501   K 4.4 03/26/2019 0501   CL 102 03/26/2019  0501   CO2 26 03/26/2019 0501   GLUCOSE 131 (H) 03/26/2019 0501   BUN 12 03/26/2019 0501   CREATININE 0.30 (L) 03/26/2019 0501   CALCIUM 8.1 (L) 03/26/2019 0501   PROT 5.7 (L) 03/26/2019 0501   ALBUMIN 2.2 (L) 03/26/2019 0501   AST 18 03/26/2019 0501   ALT 6 03/26/2019 0501   ALKPHOS 45 03/26/2019 0501   BILITOT 0.4 03/26/2019 0501   GFRNONAA >60 03/26/2019 0501   GFRAA >60 03/26/2019 0501   Lipase     Component Value Date/Time   LIPASE 11.0 11/02/2018 1103    Studies/Results: No results found.    Rosario Adie, MD  [facility]

## 2019-03-27 ENCOUNTER — Inpatient Hospital Stay (HOSPITAL_COMMUNITY): Payer: Medicare Other

## 2019-03-27 LAB — COMPREHENSIVE METABOLIC PANEL
ALT: 13 U/L (ref 0–44)
AST: 49 U/L — ABNORMAL HIGH (ref 15–41)
Albumin: 2.2 g/dL — ABNORMAL LOW (ref 3.5–5.0)
Alkaline Phosphatase: 69 U/L (ref 38–126)
Anion gap: 8 (ref 5–15)
BUN: 14 mg/dL (ref 8–23)
CO2: 25 mmol/L (ref 22–32)
Calcium: 8.4 mg/dL — ABNORMAL LOW (ref 8.9–10.3)
Chloride: 101 mmol/L (ref 98–111)
Creatinine, Ser: 0.41 mg/dL — ABNORMAL LOW (ref 0.44–1.00)
GFR calc Af Amer: >60 mL/min (ref >60)
GFR calc non Af Amer: >60 mL/min (ref >60)
Glucose, Bld: 119 mg/dL — ABNORMAL HIGH (ref 70–99)
Potassium: 4.3 mmol/L (ref 3.5–5.1)
Sodium: 134 mmol/L — ABNORMAL LOW (ref 135–145)
Total Bilirubin: 0.5 mg/dL (ref 0.3–1.2)
Total Protein: 6 g/dL — ABNORMAL LOW (ref 6.5–8.1)

## 2019-03-27 LAB — DIFFERENTIAL
Abs Immature Granulocytes: 0.03 10*3/uL (ref 0.00–0.07)
Basophils Absolute: 0 10*3/uL (ref 0.0–0.1)
Basophils Relative: 0 %
Eosinophils Absolute: 0.2 10*3/uL (ref 0.0–0.5)
Eosinophils Relative: 2 %
Immature Granulocytes: 0 %
Lymphocytes Relative: 19 %
Lymphs Abs: 1.9 10*3/uL (ref 0.7–4.0)
Monocytes Absolute: 0.8 10*3/uL (ref 0.1–1.0)
Monocytes Relative: 8 %
Neutro Abs: 6.7 10*3/uL (ref 1.7–7.7)
Neutrophils Relative %: 71 %

## 2019-03-27 LAB — CBC
HCT: 29.5 % — ABNORMAL LOW (ref 36.0–46.0)
Hemoglobin: 9.6 g/dL — ABNORMAL LOW (ref 12.0–15.0)
MCH: 31.7 pg (ref 26.0–34.0)
MCHC: 32.5 g/dL (ref 30.0–36.0)
MCV: 97.4 fL (ref 80.0–100.0)
Platelets: 213 10*3/uL (ref 150–400)
RBC: 3.03 MIL/uL — ABNORMAL LOW (ref 3.87–5.11)
RDW: 15 % (ref 11.5–15.5)
WBC: 9.7 10*3/uL (ref 4.0–10.5)
nRBC: 0 % (ref 0.0–0.2)

## 2019-03-27 LAB — PREALBUMIN: Prealbumin: 8.1 mg/dL — ABNORMAL LOW (ref 18–38)

## 2019-03-27 LAB — PHOSPHORUS: Phosphorus: 4.1 mg/dL (ref 2.5–4.6)

## 2019-03-27 LAB — GLUCOSE, CAPILLARY
Glucose-Capillary: 119 mg/dL — ABNORMAL HIGH (ref 70–99)
Glucose-Capillary: 131 mg/dL — ABNORMAL HIGH (ref 70–99)
Glucose-Capillary: 139 mg/dL — ABNORMAL HIGH (ref 70–99)
Glucose-Capillary: 140 mg/dL — ABNORMAL HIGH (ref 70–99)

## 2019-03-27 LAB — MAGNESIUM: Magnesium: 2.2 mg/dL (ref 1.7–2.4)

## 2019-03-27 LAB — TRIGLYCERIDES: Triglycerides: 65 mg/dL (ref <150)

## 2019-03-27 MED ORDER — MORPHINE SULFATE (PF) 2 MG/ML IV SOLN: 2.0000 mg | INTRAVENOUS | Status: DC | PRN

## 2019-03-27 MED ORDER — TRAMADOL HCL 50 MG PO TABS
50.0000 mg | ORAL_TABLET | Freq: Four times a day (QID) | ORAL | Status: DC | PRN
  Filled 2019-03-27: qty 1

## 2019-03-27 MED ORDER — HYDROCODONE-ACETAMINOPHEN 7.5-325 MG/15ML PO SOLN
10.0000 mL | ORAL | Status: DC | PRN
  Administered 2019-03-27: 10 mL via ORAL
  Filled 2019-03-27: qty 15

## 2019-03-27 MED ORDER — METHOCARBAMOL 500 MG PO TABS: 500.0000 mg | ORAL_TABLET | Freq: Four times a day (QID) | ORAL | Status: DC

## 2019-03-27 MED ORDER — METHOCARBAMOL 1000 MG/10ML IJ SOLN
500.0000 mg | Freq: Four times a day (QID) | INTRAVENOUS | Status: DC
  Administered 2019-03-27 – 2019-04-01 (×21): 500 mg via INTRAVENOUS
  Filled 2019-03-27 (×4): qty 500
  Filled 2019-03-27: qty 5
  Filled 2019-03-27 (×3): qty 500
  Filled 2019-03-27 (×2): qty 5
  Filled 2019-03-27 (×3): qty 500
  Filled 2019-03-27: qty 5
  Filled 2019-03-27 (×10): qty 500
  Filled 2019-03-27: qty 5

## 2019-03-27 MED ORDER — ACETAMINOPHEN 500 MG PO TABS
500.0000 mg | ORAL_TABLET | Freq: Four times a day (QID) | ORAL | Status: DC
  Filled 2019-03-27: qty 1

## 2019-03-27 MED ORDER — TRAVASOL 10 % IV SOLN
INTRAVENOUS | Status: AC
  Administered 2019-03-27: 18:00:00 via INTRAVENOUS
  Filled 2019-03-27: qty 756

## 2019-03-27 MED ORDER — MORPHINE SULFATE (PF) 2 MG/ML IV SOLN
1.0000 mg | INTRAVENOUS | Status: DC | PRN
  Administered 2019-03-27 – 2019-03-29 (×6): 2 mg via INTRAVENOUS
  Filled 2019-03-27 (×7): qty 1

## 2019-03-27 MED ORDER — BISACODYL 5 MG PO TBEC
10.0000 mg | DELAYED_RELEASE_TABLET | Freq: Once | ORAL | Status: AC
  Administered 2019-03-27: 10 mg via ORAL
  Filled 2019-03-27: qty 2

## 2019-03-27 MED ORDER — KETOROLAC TROMETHAMINE 15 MG/ML IJ SOLN
15.0000 mg | Freq: Three times a day (TID) | INTRAMUSCULAR | Status: AC
  Administered 2019-03-27 – 2019-03-29 (×6): 15 mg via INTRAVENOUS
  Filled 2019-03-27 (×6): qty 1

## 2019-03-27 MED ORDER — INSULIN ASPART 100 UNIT/ML ~~LOC~~ SOLN
0.0000 [IU] | Freq: Four times a day (QID) | SUBCUTANEOUS | Status: DC
  Administered 2019-03-27 – 2019-03-29 (×4): 2 [IU] via SUBCUTANEOUS

## 2019-03-27 NOTE — Progress Notes (Signed)
Physical Therapy Treatment Patient Details Name: Theresa Arnold MRN: [id number] DOB: [birth year] Today's Date: 03/27/2019    History of Present Illness 85yo female who presented to the ED with abdominal pain. Admitted for chronic/recurrent sigmoid diverticulitis with known abscess and distal sigmoid stricture. 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 Pt s/p laparoscopic and mobilization of the splenic flexure, laparotomy and sigmoid colectomy (Hartmann procedure) with end colostomyscess on 03/22/19    PT Comments    Pt very slow to progress mobility.  Pt requiring at least min assist at this time.  If family unable to assist upon d/c, pt may need SNF.  Follow Up Recommendations  Home health PT;Supervision/Assistance - 24 hour     Equipment Recommendations  Rolling walker with 5" wheels    Recommendations for Other Services       Precautions / Restrictions Precautions Precautions: Fall Precaution Comments: Pt fearful of falling, abdominal pain due to surgery Restrictions Weight Bearing Restrictions: No    Mobility  Bed Mobility Overal bed mobility: Needs Assistance Bed Mobility: Supine to Sit       Sit to supine: Min assist   General bed mobility comments: use of bed rails, assist for scooting to EOB, bed pad utilized  Transfers Overall transfer level: Needs assistance Equipment used: Rolling walker (2 wheeled) Transfers: Sit to/from Stand Sit to Stand: Min assist Stand pivot transfers: Min assist       General transfer comment: verbal cues for safe technique  Ambulation/Gait Ambulation/Gait assistance: Min assist Gait Distance (Feet): 8 Feet Assistive device: Rolling walker (2 wheeled) Gait Pattern/deviations: Step-through pattern;Decreased stride length;Trunk flexed Gait velocity: decr   General  Gait Details: verbal cues for RW positioning posture, encouraged at least ambulating within room, pt fearful of falling, also fatigues quickly   Stairs             Wheelchair Mobility    Modified Rankin (Stroke Patients Only)       Balance           Standing balance support: Bilateral upper extremity supported Standing balance-Leahy Scale: Poor                              Cognition Arousal/Alertness: Awake/alert Behavior During Therapy: WFL for tasks assessed/performed Overall Cognitive Status: Within Functional Limits for tasks assessed                                        Exercises      General Comments        Pertinent Vitals/Pain Pain Assessment: Faces Faces Pain Scale: Hurts even more Pain Location: abdomen Pain Descriptors / Indicators: Discomfort;Guarding Pain Intervention(s): Repositioned;Monitored during session    Home Living                      Prior Function            PT Goals (current goals can now be found in the care plan section) Progress towards PT goals: Progressing toward goals    Frequency    Min 3X/week      PT Plan Current plan remains appropriate    Co-evaluation              AM-PAC PT "  6 Clicks" Mobility   Outcome Measure  Help needed turning from your back to your side while in a flat bed without using bedrails?: A Little Help needed moving from lying on your back to sitting on the side of a flat bed without using bedrails?: A Little Help needed moving to and from a bed to a chair (including a wheelchair)?: A Little Help needed standing up from a chair using your arms (e.g., wheelchair or bedside chair)?: A Little Help needed to walk in hospital room?: A Little Help needed climbing 3-5 steps with a railing? : A Lot 6 Click Score: 17    End of Session   Activity Tolerance: Patient limited by pain;Patient limited by fatigue Patient left: in chair;with call  bell/phone within reach Nurse Communication: Mobility status(pt aware to call for assist back to bed, nurse tech aware pt up in recliner) PT Visit Diagnosis: Unsteadiness on feet (R26.81);Muscle weakness (generalized) (M62.81)     Time: [id number] PT Time Calculation (min) (ACUTE ONLY): 14 min  Charges:  $Gait Training: 8-22 mins                     Carmelia Bake, PT, [place] Office: [phone] Pager: [phone]  Trena Platt 03/27/2019, 12:57 PM

## 2019-03-27 NOTE — Progress Notes (Signed)
[facility]                                                                              Patient Demographics  Theresa Arnold, is a 85 y.o. female, DOB - [birth year], IHK:[id number]  Admit date - 03/15/2019   Admitting Physician Charlsie Quest, MD  Outpatient Primary MD for the patient is Corwin Levins, MD  Outpatient specialists:   LOS - 12  days   Medical records reviewed and are as summarized below:    Chief Complaint  Patient presents with   Abdominal Pain       Brief summary    Patient is a 85 y.o.femalewith COPD, hyperlipidemia, and chronic sigmoid diverticulitis with known abscess and stricture in the distal sigmoid colon who presented to the ED for evaluation of abdominal pain. Patient follows with GI, Dr. Orvan Falconer, for known sigmoid diverticulitis and abscess. Per GI documentation, case was discussed with IR and fluid collection was not considered amenable to IR drainage. She underwent colonoscopy 01/27/2019 which was limited due to presence of stool. Distal sigmoid stricture was seen. She was reportedly referred to general surgery, [facility] Dr. Cliffton Asters, and has no present plans for surgical intervention.  Patient was seen in the ED on 03/11/2019 at which time a repeat CT abdomen/pelvis with contrast showed an apparent increase in proximal sigmoid colon wall thickening and unchanged 3.5 cm intramural sigmoid colonic wall abscess/collection which was present since 11/02/2018. There was question of low-grade obstruction. She was given a prescription for ciprofloxacin and metronidazole.  Patient stated that she has had continued generalized abdominal pain. She has not had oral intake due to the nausea after eating, unable to take her home medications including the recently prescribed antibiotics. She stated that she has not had any bowel movements despite use of MiraLAX for several days. She reported a 30 pound weight loss since last year. She has not had any  subjective fevers. She reported good urine output without dysuria.   Assessment & Plan    Principal Problem: Acute on chronic/recurrent sigmoid diverticulitis with known abscess, distal sigmoid stricture/perforation, now developing ileus -Patient had presented with intermittent abdominal pain with weight loss secondary to low-grade diverticular stricture. Patient was being followed by GI who had referred to general surgery. -On admission, general surgery was consulted, patient underwent laparoscopic and mobilization of the splenic flexure, laparotomy and sigmoid colectomy) Hartmann procedure with end colostomy by Dr. Daphine Deutscher on 9/9. -Continue TPN until patient is tolerating diet  -Today complaining of dull constant abdominal pain, nausea.  Abdominal x-ray shows ileus versus partial SBO. General surgery following, recommended conservative management -Will decrease morphine 1 to 2 mg every 4 hours as needed, discontinue Hycet -Patient has been refusing Colace, added Dulcolax 10 mg p.o. x1 encourage OOB, and mobility.  Continue Reglan   Acute metabolic encephalopathy -At baseline mental status, no acute issues   COPD No wheezing, continue duo nebs as needed, Incruse  Oral thrush Completed IV Diflucan.   -Continue nystatin swish and swallow   Severe malnutrition Prealbumin 6.5 on 9/5, BMI 19, continue TPN  Hiccups On Reglan and Thorazine as needed for hiccups  Code Status: Full CODE STATUS DVT Prophylaxis:  Lovenox  Family Communication: Discussed all imaging results, lab results, explained to the patient   Disposition Plan: Currently on TPN, DC when cleared by general surgery  Time Spent in minutes 25 minutes  Procedures:   Acute abdominal series 03/15/2019  Laparoscopy and mobilization of splenic flexure: Laparotomy and sigmoid colectomy(Hartmann procedure)with end colostomy per Dr. Daphine Deutscher 03/22/2019  PICC line 03/21/2019   Consultants:   GI General surgery, Dr.  Cliffton Asters  Antimicrobials:   Anti-infectives (From admission, onward)   Start     Dose/Rate Route Frequency Ordered Stop   03/22/19 2200  piperacillin-tazobactam (ZOSYN) IVPB 3.375 g     3.375 g 12.5 mL/hr over 240 Minutes Intravenous Every 8 hours 03/22/19 1725 03/23/19 0224   03/22/19 1115  cefoTEtan (CEFOTAN) 2 g in sodium chloride 0.9 % 100 mL IVPB     2 g 200 mL/hr over 30 Minutes Intravenous On call to O.R. 03/21/19 1829 03/22/19 1320   03/18/19 1900  fluconazole (DIFLUCAN) IVPB 100 mg     100 mg 50 mL/hr over 60 Minutes Intravenous Every 24 hours 03/18/19 1753 03/24/19 1918   03/16/19 0600  piperacillin-tazobactam (ZOSYN) IVPB 3.375 g  Status:  Discontinued     3.375 g 12.5 mL/hr over 240 Minutes Intravenous Every 8 hours 03/15/19 2316 03/22/19 1725   03/15/19 2315  piperacillin-tazobactam (ZOSYN) IVPB 3.375 g     3.375 g 100 mL/hr over 30 Minutes Intravenous NOW 03/15/19 2314 03/16/19 0038         Medications  Scheduled Meds:  acetaminophen  500 mg Oral Q6H   docusate sodium  100 mg Oral BID   enoxaparin (LOVENOX) injection  30 mg Subcutaneous Q24H   insulin aspart  0-15 Units Subcutaneous Q6H   ketorolac  15 mg Intravenous Q8H   lip balm  1 application Topical BID   mometasone-formoterol  2 puff Inhalation BID   nystatin  5 mL Oral QID   sodium chloride flush  10-40 mL Intracatheter Q12H   sodium chloride flush  3 mL Intravenous Q12H   umeclidinium bromide  1 puff Inhalation Daily   Continuous Infusions:  sodium chloride Stopped (03/19/19 0507)   chlorproMAZINE (THORAZINE) IV 12.5 mg (03/24/19 2151)   methocarbamol (ROBAXIN) IV 500 mg (03/27/19 1121)   TPN ADULT (ION) 70 mL/hr at 03/27/19 0600   TPN ADULT (ION) 70 mL/hr at 03/27/19 1731   PRN Meds:.sodium chloride, albuterol, alum & mag hydroxide-simeth, chlorproMAZINE (THORAZINE) IV, guaiFENesin-dextromethorphan, HYDROcodone-acetaminophen, ipratropium-albuterol, magic mouthwash, metoCLOPramide  (REGLAN) injection, morphine injection, ondansetron **OR** ondansetron (ZOFRAN) IV, phenol, polyethylene glycol, simethicone, sodium chloride flush, sodium chloride flush, traMADol      Subjective:   Theresa Arnold was seen and examined today.  Complaining of dull and constant pain diffusely in abdomen.  No significant output in ostomy.  Alert and oriented.  No fevers.  Feeling nauseous but no vomiting.   Patient denies dizziness, chest pain, shortness of breath, new weakness, numbess, tingling. No acute events overnight.    Objective:   Vitals:   03/26/19 2102 03/27/19 0607 03/27/19 0939 03/27/19 1300  BP:  138/75  126/63  Pulse:  88  91  Resp:  16  18  Temp:  98.3 F (36.8 C)  99.5 F (37.5 C)  TempSrc:  Oral  Oral  SpO2: 96% 99% 97% 98%  Weight:      Height:        Intake/Output Summary (Last 24 hours) at 03/27/2019 1751 Last  data filed at 03/27/2019 1100 Gross per 24 hour  Intake 1965.83 ml  Output 2900 ml  Net -934.17 ml     Wt Readings from Last 3 Encounters:  03/24/19 49.4 kg  03/11/19 46.3 kg  01/27/19 51.7 kg   Physical Exam  General: Alert and oriented x 3, NAD, appears uncomfortable   eyes:  HEENT:  Atraumatic, normocephalic  Cardiovascular: S1 S2 clear, RRR. No pedal edema b/l  Respiratory: CTAB, no wheezing, rales or rhonchi  Gastrointestinal: Soft, mild diffuse TTP, LLQ colostomy minimal output, hypoactive bowel sounds  Ext: no pedal edema bilaterally  Neuro: no new deficits  Musculoskeletal: No cyanosis, clubbing  Skin: No rashes  Psych: Normal affect and demeanor, alert and oriented x3     Data Reviewed:  I have personally reviewed following labs and imaging studies  Micro Results Recent Results (from the past 240 hour(s))  Surgical pcr screen     Status: None   Collection Time: 03/21/19 11:56 PM   Specimen: Nasal Mucosa; Nasal Swab  Result Value Ref Range Status   MRSA, PCR NEGATIVE NEGATIVE Final   Staphylococcus aureus  NEGATIVE NEGATIVE Final    Comment: (NOTE) The Xpert SA Assay (FDA approved for NASAL specimens in patients 54 years of age and older), is one component of a comprehensive surveillance program. It is not intended to diagnose infection nor to guide or monitor treatment. Performed at [facility], [address], [place], [place] [address]     Radiology Reports Dg Abd 1 View  Result Date: 03/18/2019 CLINICAL DATA:  History of sigmoid diverticulitis with associated small abscess by CT scan 03/11/2019. Possible colon stricture. EXAM: ABDOMEN - 1 VIEW COMPARISON:  Chest and two views abdomen 03/15/2019. CT abdomen and pelvis 03/11/2019. FINDINGS: No evidence of free intraperitoneal air is seen on supine films. There is mild gaseous distention of the colon and scattered small bowel loops, improved since the comparison plain films. There is some gas in the rectum. No abnormal abdominal calcification. No acute bony abnormality. IMPRESSION: Mild gaseous distention of the colon appears improved compared to the most recent exam. Electronically Signed   By: Drusilla Kanner M.D.   On: 03/18/2019 08:27   Ct Abdomen Pelvis W Contrast  Result Date: 03/11/2019 CLINICAL DATA:  85 year old female with acute abdominal and pelvic pain. EXAM: CT ABDOMEN AND PELVIS WITH CONTRAST TECHNIQUE: Multidetector CT imaging of the abdomen and pelvis was performed using the standard protocol following bolus administration of intravenous contrast. CONTRAST:  OMNIPAQUE IOHEXOL 300 MG/ML  SOLN COMPARISON:  12/28/2018 FINDINGS: Lower chest: No acute abnormality. Hepatobiliary: The liver and gallbladder are unremarkable except for small hepatic cysts. No biliary dilatation. Pancreas: Unremarkable Spleen: Unremarkable Adrenals/Urinary Tract: The kidneys, adrenal glands and bladder are unremarkable except for stable small probable LEFT renal cyst. Stomach/Bowel: Wall thickening of the proximal sigmoid colon is  noted and appears slightly increased from 12/28/2018 and may represent increasing diverticulitis. A 3.5 cm intramural collection along the LEFT sigmoid colonic wall is again noted. Mild colonic distension proximal to the sigmoid colon is noted with fluid and gas. There is no evidence of small bowel obstruction. Vascular/Lymphatic: Aortic atherosclerosis. No enlarged abdominal or pelvic lymph nodes. Reproductive: Uterus and bilateral adnexa are unremarkable. Other: No ascites, pneumoperitoneum or new collection. Musculoskeletal: No acute or suspicious bony abnormalities. IMPRESSION: 1. Apparent increase in proximal sigmoid colon wall thickening which may represent flare up of acute diverticulitis at this area. Unchanged 3.5 cm intramural sigmoid colonic wall  abscess/collection, which has been present since 11/02/2018. Relative distension of the colon proximal to the sigmoid colon which may represent a low grade obstruction. No evidence of pneumoperitoneum. 2.  Aortic Atherosclerosis (ICD10-I70.0). Electronically Signed   By: Harmon Pier M.D.   On: 03/11/2019 21:15   Dg Abd Acute 2+v W 1v Chest  Result Date: 03/20/2019 CLINICAL DATA:  Abdominal pain in a patient with a history of chronic sigmoid diverticulitis and a small abscess on CT scan 03/11/2019. EXAM: DG ABDOMEN ACUTE W/ 1V CHEST COMPARISON:  CT abdomen and pelvis 03/11/2019. Chest and two views abdomen 03/15/2019 and two views of the abdomen 03/18/2019. FINDINGS: Single-view of the chest demonstrates clear lungs and normal heart size. Trace pleural effusions. No pneumothorax. Atherosclerosis noted. Two views of the abdomen show no free intraperitoneal air. Multiple gas-filled but nondilated loops of small and large bowel are present. No abnormal abdominal calcification or focal bony abnormality. IMPRESSION: Trace bilateral pleural effusions. Multiple gas-filled but nondilated loops of small and large bowel may be due to ileus. Electronically Signed   By:  Drusilla Kanner M.D.   On: 03/20/2019 11:15   Dg Abd Acute W/chest  Result Date: 03/15/2019 CLINICAL DATA:  Left lower quadrant pain. EXAM: DG ABDOMEN ACUTE W/ 1V CHEST COMPARISON:  Abdominal CT March 11, 2019 FINDINGS: Sub pathologic gaseous distension of small bowel loops and colon which may represent a low-grade/intermediate obstruction at the level of the distal colon, or ileus. No evidence of free gas in the abdomen seen radiographically. IMPRESSION: Sub pathologic gaseous distension of small bowel loops and colon which may represent a low-grade/intermediate obstruction at the level of the distal colon, or ileus. Electronically Signed   By: Ted Mcalpine M.D.   On: 03/15/2019 18:37   Dg Abd Portable 1v  Result Date: 03/27/2019 CLINICAL DATA:  Nausea and vomiting. EXAM: PORTABLE ABDOMEN - 1 VIEW COMPARISON:  Abdominal x-ray dated March 21, 2019. FINDINGS: Stable to slightly worsened dilatation of air-filled small bowel. There is a small amount of air in the rectosigmoid colon, but the colon appears largely decompressed. No acute osseous abnormality. IMPRESSION: 1. Stable to slightly worsened small bowel ileus versus partial obstruction. Electronically Signed   By: [name] M.D.   On: 03/27/2019 12:51   Dg Abd Portable 1v  Result Date: 03/21/2019 CLINICAL DATA:  85 year old admitted 03/15/2019 for chronic recurrent sigmoid diverticulitis. Follow-up possible ileus. EXAM: PORTABLE ABDOMEN - 1 VIEW COMPARISON:  03/20/2019 and earlier, including CT abdomen and pelvis 03/11/2019. FINDINGS: Since yesterday's examination, many of the small bowel loops have now become moderately distended with gas. Gas is present within nondistended transverse colon. No suggestion of free air on the supine image. No visible opaque urinary tract calculi. IMPRESSION: Worsening of small bowel ileus versus developing partial small bowel obstruction. Electronically Signed   By: Hulan Saas M.D.   On:  03/21/2019 14:09   [place] Ekg Site Rite  Result Date: 03/21/2019 If Site Rite image not attached, placement could not be confirmed due to current cardiac rhythm.   Lab Data:  CBC: Recent Labs  Lab 03/21/19 0258 03/22/19 0300 03/23/19 0400 03/24/19 0908 03/25/19 0418 03/27/19 0430  WBC 5.9 5.2 11.6* 14.8* 12.2* 9.7  NEUTROABS 3.8 2.6  --   --   --  6.7  HGB 13.6 10.8* 11.4* 9.3* 9.3* 9.6*  HCT 41.8 33.5* 35.3* 29.5* 29.2* 29.5*  MCV 97.7 97.7 98.9 100.0 99.3 97.4  PLT 221 188 180 160 175 213   Basic  Metabolic Panel: Recent Labs  Lab 03/23/19 0400 03/24/19 0340 03/25/19 0418 03/26/19 0501 03/27/19 0430  NA 138 140 136 135 134*  K 4.0 3.9 4.5 4.4 4.3  CL 102 106 103 102 101  CO2 27 26 27 26 25   GLUCOSE 282* 130* 169* 131* 119*  BUN 11 13 15 12 14   CREATININE 0.57 0.51 0.48 0.30* 0.41*  CALCIUM 8.0* 8.1* 8.1* 8.1* 8.4*  MG 2.2 2.2 2.2 2.3 2.2  PHOS 2.5 3.0 3.6 4.3 4.1   GFR: Estimated Creatinine Clearance: 40.8 mL/min (A) (by C-G formula based on SCr of 0.41 mg/dL (L)). Liver Function Tests: Recent Labs  Lab 03/23/19 0400 03/24/19 0340 03/25/19 0418 03/26/19 0501 03/27/19 0430  AST 14* 16 16 18  49*  ALT 6 5 5 6 13   ALKPHOS 31* 32* 41 45 69  BILITOT 0.5 0.2* 0.3 0.4 0.5  PROT 5.4* 5.0* 5.2* 5.7* 6.0*  ALBUMIN 2.4* 2.2* 2.2* 2.2* 2.2*   No results for input(s): LIPASE, AMYLASE in the last 168 hours. No results for input(s): AMMONIA in the last 168 hours. Coagulation Profile: No results for input(s): INR, PROTIME in the last 168 hours. Cardiac Enzymes: No results for input(s): CKTOTAL, CKMB, CKMBINDEX, TROPONINI in the last 168 hours. BNP (last 3 results) No results for input(s): PROBNP in the last 8760 hours. HbA1C: No results for input(s): HGBA1C in the last 72 hours. CBG: Recent Labs  Lab 03/26/19 1956 03/26/19 2334 03/27/19 0356 03/27/19 0757 03/27/19 1152  GLUCAP 117* 142* 119* 131* 139*   Lipid Profile: Recent Labs    03/27/19 0430  TRIG  65   Thyroid Function Tests: No results for input(s): TSH, T4TOTAL, FREET4, T3FREE, THYROIDAB in the last 72 hours. Anemia Panel: No results for input(s): VITAMINB12, FOLATE, FERRITIN, TIBC, IRON, RETICCTPCT in the last 72 hours. Urine analysis:    Component Value Date/Time   COLORURINE STRAW (A) 03/11/2019 1906   APPEARANCEUR CLEAR 03/11/2019 1906   LABSPEC 1.003 (L) 03/11/2019 1906   PHURINE 8.0 03/11/2019 1906   GLUCOSEU NEGATIVE 03/11/2019 1906   GLUCOSEU NEGATIVE 11/02/2018 1103   HGBUR NEGATIVE 03/11/2019 1906   BILIRUBINUR NEGATIVE 03/11/2019 1906   KETONESUR NEGATIVE 03/11/2019 1906   PROTEINUR NEGATIVE 03/11/2019 1906   UROBILINOGEN 0.2 11/02/2018 1103   NITRITE NEGATIVE 03/11/2019 1906   LEUKOCYTESUR NEGATIVE 03/11/2019 1906     Theresa Arnold M.D. [facility] 03/27/2019, 5:51 PM  Pager: [phone] Between 7am to 7pm - call Pager - [phone]  After 7pm go to [email or web address] - password [email or web address]  Call night coverage person covering after 7pm

## 2019-03-27 NOTE — Consult Note (Signed)
[place] Nurse ostomy follow up Stoma type/location: LLQ end colostomy Stomal assessment/size: 1 1/2" pink patent and scant brown stool in pouch.  Pending scan for ileus. Peristomal assessment: intact Treatment options for stomal/peristomal skin: barrier ring and 2 piece pouch.  Output scant brown stool Ostomy pouching: 2pc. Pouch with barrier ring  Education provided: Patient able to open and close.  Observes pouch change.   Enrolled patient in [place] Start Discharge program: Yes [place] team will follow.  Domenic Moras MSN, RN, FNP-BC [facility]ON Wound, Ostomy, Continence Nurse Pager [phone]

## 2019-03-27 NOTE — Progress Notes (Signed)
Occupational Therapy Treatment Patient Details Name: Theresa Arnold MRN: [id number] DOB: [birth year] Today's Date: 03/27/2019    History of present illness 85yo female who presented to the ED with abdominal pain. Admitted for chronic/recurrent sigmoid diverticulitis with known abscess and distal sigmoid stricture. 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 Pt s/p laparoscopic and mobilization of the splenic flexure, laparotomy and sigmoid colectomy (Hartmann procedure) with end colostomyscess on 03/22/19   OT comments  Pt in chair. Pt agreed to grooming activity then tray came.  OT encouraged pt to open items and prepare tray. Pt needed max encouragement to particapate.   Follow Up Recommendations  SNF;Supervision/Assistance - 24 hour          Precautions / Restrictions Precautions Precautions: Fall Precaution Comments: Pt fearful of falling, abdominal pain due to surgery       Mobility Bed Mobility               General bed mobility comments: pt in chair  Transfers Overall transfer level: Needs assistance                        ADL either performed or assessed with clinical judgement   ADL Overall ADL's : Needs assistance/impaired Eating/Feeding: Set up;Sitting Eating/Feeding Details (indicate cue type and reason): needed MAX encouragement toperform self feeding Grooming: Wash/dry face;Wash/dry hands;Supervision/safety;Sitting                                 General ADL Comments: pt in chair and dinner tray came. Encouraged pt toopen needed items and reach for all needed items.  Pt needed [facility] encouragement and increased time.     Vision Patient Visual Report: No change from baseline            Cognition Arousal/Alertness: Awake/alert Behavior During Therapy: WFL for tasks  assessed/performed Overall Cognitive Status: Within Functional Limits for tasks assessed                                                     Pertinent Vitals/ Pain       Pain Assessment: 0-10 Pain Score: 2  Pain Location: abdomen Pain Descriptors / Indicators: Discomfort;Guarding Pain Intervention(s): Limited activity within patient's tolerance      Progress Toward Goals  OT Goals(current goals can now be found in the care plan section)  Progress towards OT goals: Progressing toward goals  Acute Rehab OT Goals Patient Stated Goal: go home, have less pain; maintain independence, walk around farther OT Goal Formulation: With patient Time For Goal Achievement: 03/24/19 Potential to Achieve Goals: Good  Plan Discharge plan remains appropriate       AM-PAC OT "6 Clicks" Daily Activity     Outcome Measure   Help from another person eating meals?: A Little Help from another person taking care of personal grooming?: A Little Help from another person toileting, which includes using toliet, bedpan, or urinal?: A Lot Help from another person bathing (including washing, rinsing, drying)?: A Lot Help from another person to put on and taking off regular upper body clothing?: A Little Help from another person to put on and taking off regular  lower body clothing?: A Lot 6 Click Score: 15    End of Session    OT Visit Diagnosis: Unsteadiness on feet (R26.81);Other abnormalities of gait and mobility (R26.89);Muscle weakness (generalized) (M62.81);Repeated falls (R29.6)   Activity Tolerance Patient limited by fatigue   Patient Left with call bell/phone within reach;with nursing/sitter in room;with SCD's reapplied;in chair   Nurse Communication          Time: [id number] OT Time Calculation (min): 18 min  Charges: OT General Charges $OT Visit: 1 Visit OT Treatments $Self Care/Home Management : 8-22 mins  Kari Baars, [place] [phone] Office- [phone]      Garber, Edwena Felty D 03/27/2019, 6:10 PM

## 2019-03-27 NOTE — Progress Notes (Addendum)
[facility]/Trauma Progress Note  5 Days Post-Op   Assessment/Plan Principal Problem: Sigmoid stricture (HCC) Active Problems: COPD (chronic obstructive pulmonary disease) (HCC) Diverticulitis of sigmoid colon Abnormal CT scan, gastrointestinal tract Colonic partial obstruction from sigmoid stricture Polyp of sigmoid colon Oral thrush  Partial colonic obstruction from sigmoid stricture presumed due to chronic recurrent diverticulitis -S/P laparoscopic and mobilization of the splenic flexure, laparotomy and sigmoid colectomy Jeanette Caprice procedure) with end colostomy, Dr. Hassell Done, 03/22/2019 - Pathology benign showed diverticulitis Severe malnutrition -Prealbumin 8.1 on09/14 -TPN  FEN: CLD,TPN VTE: SCD's, lovenox [id number] 09/02-09/10, Diflucan 09/05-09/12, WBC 9.7, afebrile Foley: Removed POD 1 Follow up: Dr. Hassell Done  DISPO: Pain control, mobilize, IS, PT, continue TPN until patient is tolerating a regular diet.  AXR pending with concern for ileus.    LOS: 12 days    Subjective: CC: Abdominal pain, nausea vomiting  Patient states her pain is about the same as it was yesterday.  She says it sharp at times and constantly dull.  She has not been walking or been out of bed.  She has trouble swallowing pills at baseline.  She states persistent nausea with some vomiting.  Instructed nurse to remove pure wick.  Discussed with patient and nurse that she needs to get out of bed.  Objective: Vital signs in last 24 hours: Temp:  [98.3 F (36.8 C)-98.9 F (37.2 C)] 98.3 F (36.8 C) (09/14 0607) Pulse Rate:  [88-91] 88 (09/14 0607) Resp:  [16-18] 16 (09/14 0607) BP: (138-140)/(65-75) 138/75 (09/14 0607) SpO2:  [96 %-99 %] 99 % (09/14 0607) Last BM Date: 03/25/19  Intake/Output from previous day: 09/13 0701 - 09/14 0700 In: 1965.8 [P.O.:360; I.V.:1605.8] Out: 3650 [Urine:3500; Stool:150] Intake/Output this shift: No intake/output data  recorded.  PE: Gen:  Alert, NAD, pleasant, cooperative Pulm: Rate and effort normal Abd: Soft, mild distention, + bowel sounds, midline with glue in place, LLQ colostomy, stoma is pink with a small amount of necrosis at the superior aspect (see photo below), small amount of liquid brown stool in bag.  Appropriately tender, no peritonitis Skin: no rashes noted, warm and dry  Anti-infectives: Anti-infectives (From admission, onward)   Start     Dose/Rate Route Frequency Ordered Stop   03/22/19 2200  piperacillin-tazobactam (ZOSYN) IVPB 3.375 g     3.375 g 12.5 mL/hr over 240 Minutes Intravenous Every 8 hours 03/22/19 1725 03/23/19 0224   03/22/19 1115  cefoTEtan (CEFOTAN) 2 g in sodium chloride 0.9 % 100 mL IVPB     2 g 200 mL/hr over 30 Minutes Intravenous On call to O.R. 03/21/19 1829 03/22/19 1320   03/18/19 1900  fluconazole (DIFLUCAN) IVPB 100 mg     100 mg 50 mL/hr over 60 Minutes Intravenous Every 24 hours 03/18/19 1753 03/24/19 1918   03/16/19 0600  piperacillin-tazobactam (ZOSYN) IVPB 3.375 g  Status:  Discontinued     3.375 g 12.5 mL/hr over 240 Minutes Intravenous Every 8 hours 03/15/19 2316 03/22/19 1725   03/15/19 2315  piperacillin-tazobactam (ZOSYN) IVPB 3.375 g     3.375 g 100 mL/hr over 30 Minutes Intravenous NOW 03/15/19 2314 03/16/19 0038      Lab Results:  Recent Labs    03/25/19 0418 03/27/19 0430  WBC 12.2* 9.7  HGB 9.3* 9.6*  HCT 29.2* 29.5*  PLT 175 213   BMET Recent Labs    03/26/19 0501 03/27/19 0430  NA 135 134*  K 4.4 4.3  CL 102 101  CO2 26 25  GLUCOSE 131*  119*  BUN 12 14  CREATININE 0.30* 0.41*  CALCIUM 8.1* 8.4*   PT/INR No results for input(s): LABPROT, INR in the last 72 hours. CMP     Component Value Date/Time   NA 134 (L) 03/27/2019 0430   K 4.3 03/27/2019 0430   CL 101 03/27/2019 0430   CO2 25 03/27/2019 0430   GLUCOSE 119 (H) 03/27/2019 0430   BUN 14 03/27/2019 0430   CREATININE 0.41 (L) 03/27/2019 0430   CALCIUM 8.4  (L) 03/27/2019 0430   PROT 6.0 (L) 03/27/2019 0430   ALBUMIN 2.2 (L) 03/27/2019 0430   AST 49 (H) 03/27/2019 0430   ALT 13 03/27/2019 0430   ALKPHOS 69 03/27/2019 0430   BILITOT 0.5 03/27/2019 0430   GFRNONAA >60 03/27/2019 0430   GFRAA >60 03/27/2019 0430   Lipase     Component Value Date/Time   LIPASE 11.0 11/02/2018 1103    Studies/Results: No results found.    Kalman Drape , [facility] Surgery 03/27/2019, 8:56 AM  Pager: [phone] Mon-Wed, Friday 7:00am-4:30pm Thurs 7am-11:30am  Consults: [phone]

## 2019-03-27 NOTE — Progress Notes (Signed)
PHARMACY - ADULT TOTAL PARENTERAL NUTRITION CONSULT NOTE   Pharmacy Consult for TPN Indication: ileus, malnutrition- intolerance to enteral feeding  Patient Measurements: Height: 5\' 2"  (157.5 cm) Weight: 108 lb 14.5 oz (49.4 kg) IBW/kg (Calculated) : 50.1 TPN AdjBW (KG): 47 Body mass index is 19.92 kg/m. Usual Weight:   Insulin Requirements: on moderate sSSI q4 hr (7 units in 24 hrs), with 10 units insulin in TPN bag  Current Nutrition: TPN, NPO till ileus resolves  IVF: none  Central access: PICC  TPN start date: 03/21/19  ASSESSMENT                                                                                                          HPI: Patient is an 85 y.o F with hx recurrent diverticulitis presented to the ED on 03/15/19 with c/o abd pain.  Abd x-ray on 9/2 showed findings with suspicion for obstruction at distal colon or ileus. CCS recom possible colectomy this week (9/8-9/11).  Pharmacy is consulted to start TPN on 9/8 for ileus and malnutrition.  Significant events:  9/3: clear liquids 9/4: changed to full liquids 9/9: Laparoscopy and mobilization of the splenic flexure; laparotomy and sigmoid colectomy with end colostomy 9/10: clear liquid diet 9/11: TPN to goal rate 9/12: NPO, reduced K, Mag & Phos in TPN 9/13: resume CL diet, reduced Phos further, added insulin  Today:   Glucose (goal <150): wnl  Electrolytes: Na trending down 134 - monitor closely, K 4.3, Mag 2.2, Phos 4.1, CorrCa 9.84; CL and CO2 wnl  Renal: scr ok  LFTs: AST slightly elevated at 49  TGs: 91 (9/9), 65 (9/14)  Prealbumin: 6.5 (9/5), 5.6 (9/9), 8.1 (9/14)  NUTRITIONAL GOALS                                                                                             RD recs (9/8):  Estimated Nutritional Needs:  Kcal:  1500-1700 Protein:  70-80g Fluid:  1.7L/day  Custom TPN at goal rate of 70 ml/hr provides: -  76 g/day protein ( 45  g/L) -  44 g/day Lipid  (26 g/L) -  269  g/day  Dextrose (16 %) -  1654 Kcal/day  PLAN  At 1800 today:  Continue custom TPN at goal rate of 70 ml/hr -  Provides 76 g of protein, 269 g of dextrose, and 44 g of lipids which provides 1654 kCals per day, meeting 100 % of goal kcal and goal AA  Electrolytes in TPN: Standard except increase Mag back to 5 mEq/L (standard); Cl:Ac ratio 1:1  TPN to contain standard multivitamins and trace elements.  Change moderate SSI q6h, continue 10 units insulin per bag  TPN lab panels on Mondays & Thursdays.  F/u daily.  Willean Schurman P 03/27/2019,7:17 AM

## 2019-03-27 NOTE — Progress Notes (Signed)
Pt tried the Hycet for pain but she is saying it's making her nauseous and she can't take it.

## 2019-03-28 LAB — BASIC METABOLIC PANEL
Anion gap: 9 (ref 5–15)
BUN: 17 mg/dL (ref 8–23)
CO2: 23 mmol/L (ref 22–32)
Calcium: 8.3 mg/dL — ABNORMAL LOW (ref 8.9–10.3)
Chloride: 101 mmol/L (ref 98–111)
Creatinine, Ser: 0.36 mg/dL — ABNORMAL LOW (ref 0.44–1.00)
GFR calc Af Amer: >60 mL/min (ref >60)
GFR calc non Af Amer: >60 mL/min (ref >60)
Glucose, Bld: 127 mg/dL — ABNORMAL HIGH (ref 70–99)
Potassium: 4.4 mmol/L (ref 3.5–5.1)
Sodium: 133 mmol/L — ABNORMAL LOW (ref 135–145)

## 2019-03-28 LAB — GLUCOSE, CAPILLARY
Glucose-Capillary: 108 mg/dL — ABNORMAL HIGH (ref 70–99)
Glucose-Capillary: 110 mg/dL — ABNORMAL HIGH (ref 70–99)
Glucose-Capillary: 120 mg/dL — ABNORMAL HIGH (ref 70–99)
Glucose-Capillary: 125 mg/dL — ABNORMAL HIGH (ref 70–99)
Glucose-Capillary: 125 mg/dL — ABNORMAL HIGH (ref 70–99)

## 2019-03-28 LAB — CBC
HCT: 27.9 % — ABNORMAL LOW (ref 36.0–46.0)
Hemoglobin: 8.9 g/dL — ABNORMAL LOW (ref 12.0–15.0)
MCH: 31.2 pg (ref 26.0–34.0)
MCHC: 31.9 g/dL (ref 30.0–36.0)
MCV: 97.9 fL (ref 80.0–100.0)
Platelets: 198 10*3/uL (ref 150–400)
RBC: 2.85 MIL/uL — ABNORMAL LOW (ref 3.87–5.11)
RDW: 14.6 % (ref 11.5–15.5)
WBC: 10.4 10*3/uL (ref 4.0–10.5)
nRBC: 0 % (ref 0.0–0.2)

## 2019-03-28 MED ORDER — BOOST / RESOURCE BREEZE PO LIQD CUSTOM
1.0000 | Freq: Three times a day (TID) | ORAL | Status: DC
  Administered 2019-03-28 – 2019-04-01 (×8): 1 via ORAL

## 2019-03-28 MED ORDER — POLYETHYLENE GLYCOL 3350 17 G PO PACK: 17.0000 g | PACK | Freq: Every day | ORAL | Status: DC | PRN

## 2019-03-28 MED ORDER — POLYETHYLENE GLYCOL 3350 17 G PO PACK: 17.0000 g | PACK | Freq: Every day | ORAL | Status: DC

## 2019-03-28 MED ORDER — TRAVASOL 10 % IV SOLN
INTRAVENOUS | Status: AC
  Administered 2019-03-28: 18:00:00 via INTRAVENOUS
  Filled 2019-03-28: qty 756

## 2019-03-28 NOTE — Progress Notes (Signed)
PHARMACY - ADULT TOTAL PARENTERAL NUTRITION CONSULT NOTE   Pharmacy Consult for TPN Indication: ileus, malnutrition- intolerance to enteral feeding  Patient Measurements: Height: 5\' 2"  (157.5 cm) Weight: 108 lb 14.5 oz (49.4 kg) IBW/kg (Calculated) : 50.1 TPN AdjBW (KG): 47 Body mass index is 19.92 kg/m. Usual Weight:   Insulin Requirements: on moderate sSSI q6hr (8 units in 24 hrs), with 10 units insulin in TPN bag  Current Nutrition: TPN, NPO till ileus resolves  IVF: none  Central access: PICC  TPN start date: 03/21/19  ASSESSMENT                                                                                                          HPI: Patient is an 85 y.o F with hx recurrent diverticulitis presented to the ED on 03/15/19 with c/o abd pain.  Abd x-ray on 9/2 showed findings with suspicion for obstruction at distal colon or ileus. CCS recom possible colectomy this week (9/8-9/11).  Pharmacy is consulted to start TPN on 9/8 for ileus and malnutrition.  Significant events:  9/3: clear liquids 9/4: changed to full liquids 9/9: Laparoscopy and mobilization of the splenic flexure; laparotomy and sigmoid colectomy with end colostomy 9/10: clear liquid diet 9/11: TPN to goal rate 9/12: NPO, reduced K, Mag & Phos in TPN 9/13: resume CL diet, reduced Phos further, added insulin  Today:   Glucose (goal <150): wnl  Electrolytes: Na trending down 133 - monitor closely, K 4.4, Mag 2.2 and Phos 4.1 on 9/14, CorrCa 9.74; CL and CO2 wnl  Renal: scr low  LFTs: AST slightly elevated at 49  TGs: 91 (9/9), 65 (9/14)  Prealbumin: 6.5 (9/5), 5.6 (9/9), 8.1 (9/14)  NUTRITIONAL GOALS                                                                                             RD recs (9/8):  Estimated Nutritional Needs:  Kcal:  1500-1700 Protein:  70-80g Fluid:  1.7L/day  Custom TPN at goal rate of 70 ml/hr provides: -  76 g/day protein ( 45  g/L) -  44 g/day Lipid  (26 g/L) -  269   g/day Dextrose (16 %) -  1654 Kcal/day  PLAN  At 1800 today:  Continue custom TPN at goal rate of 70 ml/hr -  Provides 76 g of protein, 269 g of dextrose, and 44 g of lipids which provides 1654 kCals per day, meeting 100 % of goal kcal and goal AA  Electrolytes in TPN: Standard except increase Na to 150 mEq/L; Cl:Ac ratio 1:1  TPN to contain standard multivitamins and trace elements.  Continue moderate SSI q6h, continue 10 units insulin per bag  TPN lab panels on Mondays & Thursdays.  F/u daily.  Dia Sitter P 03/28/2019,7:09 AM

## 2019-03-28 NOTE — Progress Notes (Signed)
Nutrition Follow-up  INTERVENTION:   -Boost Breeze po TID, each supplement provides 250 kcal and 9 grams of protein -TPN per Pharmacy  NUTRITION DIAGNOSIS:   Inadequate oral intake related to nausea, vomiting as evidenced by meal completion < 50%.  Ongoing.  GOAL:   Patient will meet greater than or equal to 90% of their needs  Meeting with TPN.  MONITOR:   PO intake, Supplement acceptance, Labs, Weight trends, I & O's(TPN)  ASSESSMENT:   85 y.o. female with medical history significant for COPD, hyperlipidemia, and chronic sigmoid diverticulitis with known abscess and stricture in the distal sigmoid colon who presents to the ED for evaluation of abdominal pain.  9/8 PICC placed, TPN initiated 9/9 s/p Laparoscopy and mobilization of the splenic flexure; laparotomy and sigmoid colectomy (Hartmann procedure) with end colostomy 9/12 made NPO d/t post-op ileus 9/13 diet advanced to clears 9/15 diet advanced to fulls  **RD working remotely**  Per surgery note, awaiting return of bowel function and resolution of ileus. TPN is continued to infuse at goal rate of 70 ml/hr (provides 1654 kcal, 75g protein).  Will order Boost Breeze to provide additional kcal and protein. Will monitor tolerance.  Admission weight: 103 lbs. Current weight: 102 lbs. I/Os: -200 ml since admit UOP 9/14: 900 ml  Medications reviewed. Labs reviewed: CBGs: 120-125 Low Na  Diet Order:   Diet Order            Diet full liquid Room service appropriate? Yes; Fluid consistency: Thin  Diet effective now              EDUCATION NEEDS:   No education needs have been identified at this time  Skin:  Skin Assessment: Reviewed RN Assessment  Last BM:  9/14  Height:   Ht Readings from Last 1 Encounters:  03/22/19 5\' 2"  (1.575 m)    Weight:   Wt Readings from Last 1 Encounters:  03/28/19 46.3 kg    Ideal Body Weight:  50 kg  BMI:  Body mass index is 18.66 kg/m.  Estimated Nutritional  Needs:   Kcal:  1500-1700  Protein:  70-80g  Fluid:  1.7L/day  Clayton Bibles, MS, RD, LDN Inpatient Clinical Dietitian Pager: [phone] After Hours Pager: [phone]

## 2019-03-28 NOTE — Progress Notes (Signed)
[facility]/Trauma Progress Note  6 Days Post-Op   Assessment/Plan Principal Problem: Sigmoid stricture (HCC) Active Problems: COPD (chronic obstructive pulmonary disease) (HCC) Diverticulitis of sigmoid colon Abnormal CT scan, gastrointestinal tract Colonic partial obstruction from sigmoid stricture Polyp of sigmoid colon Oral thrush  Partial colonic obstruction from sigmoid stricture presumed due to chronic recurrent diverticulitis -S/P laparoscopic and mobilization of the splenic flexure, laparotomy and sigmoid colectomy Theresa Arnold procedure) with end colostomy, Dr. Hassell Done, 03/22/2019 - Pathology benign showed diverticulitis Postop ileus - X-ray 9/14 showed SBO versus ileus - More stool in colostomy bag this morning, no vomiting overnight, appears to be resolving Severe malnutrition -Prealbumin 8.1 on09/14 -TPN  FEN:FLD,TPN VTE: SCD's, lovenox [id number] 09/02-09/10, Diflucan 09/05-09/12, WBC 10.4, afebrile Foley:Removed POD 1 Follow up:Dr. Hassell Done  DISPO:Pain control, mobilize, IS, PT, continue TPN until patient is tolerating a regular diet.     LOS: 13 days    Subjective: CC: Abdominal pain  Patient states she was having abdominal pain this morning but she took some medicine and is greatly improved.  She has no pain at this time.  She is sitting up in the chair.  She stated the nurses made her walk to the bathroom twice this morning.  She has had some persistent nausea but no vomiting.  No fever or chills overnight.  Her favorite food is shrimp.  Objective: Vital signs in last 24 hours: Temp:  [97.4 F (36.3 C)-99.5 F (37.5 C)] 97.4 F (36.3 C) (09/15 0616) Pulse Rate:  [90-91] 90 (09/15 0616) Resp:  [18-20] 20 (09/15 0616) BP: (110-134)/(63-84) 134/63 (09/15 0616) SpO2:  [96 %-98 %] 98 % (09/15 0616) Weight:  [46.3 kg] 46.3 kg (09/15 0809) Last BM Date: 03/27/19  Intake/Output from previous day: 09/14 0701 - 09/15  0700 In: 2231.9 [P.O.:220; I.V.:1861.9; IV Piggyback:150] Out: 925 [Urine:900; Stool:25] Intake/Output this shift: No intake/output data recorded.  PE: Gen: Alert, NAD, pleasant, cooperative Pulm: Rate and effort normal Abd: Soft,  no distention, + bowel sounds, midline with glue  intact and is without signs of infection, LLQ colostomy, stoma is pink, large amount of liquid brown/green stool in bag. No TTP, no peritonitis Skin: no rashes noted, warm and dry  Anti-infectives: Anti-infectives (From admission, onward)   Start     Dose/Rate Route Frequency Ordered Stop   03/22/19 2200  piperacillin-tazobactam (ZOSYN) IVPB 3.375 g     3.375 g 12.5 mL/hr over 240 Minutes Intravenous Every 8 hours 03/22/19 1725 03/23/19 0224   03/22/19 1115  cefoTEtan (CEFOTAN) 2 g in sodium chloride 0.9 % 100 mL IVPB     2 g 200 mL/hr over 30 Minutes Intravenous On call to O.R. 03/21/19 1829 03/22/19 1320   03/18/19 1900  fluconazole (DIFLUCAN) IVPB 100 mg     100 mg 50 mL/hr over 60 Minutes Intravenous Every 24 hours 03/18/19 1753 03/24/19 1918   03/16/19 0600  piperacillin-tazobactam (ZOSYN) IVPB 3.375 g  Status:  Discontinued     3.375 g 12.5 mL/hr over 240 Minutes Intravenous Every 8 hours 03/15/19 2316 03/22/19 1725   03/15/19 2315  piperacillin-tazobactam (ZOSYN) IVPB 3.375 g     3.375 g 100 mL/hr over 30 Minutes Intravenous NOW 03/15/19 2314 03/16/19 0038      Lab Results:  Recent Labs    03/27/19 0430 03/28/19 0340  WBC 9.7 10.4  HGB 9.6* 8.9*  HCT 29.5* 27.9*  PLT 213 198   BMET Recent Labs    03/27/19 0430 03/28/19 0340  NA 134* 133*  K 4.3 4.4  CL 101 101  CO2 25 23  GLUCOSE 119* 127*  BUN 14 17  CREATININE 0.41* 0.36*  CALCIUM 8.4* 8.3*   PT/INR No results for input(s): LABPROT, INR in the last 72 hours. CMP     Component Value Date/Time   NA 133 (L) 03/28/2019 0340   K 4.4 03/28/2019 0340   CL 101 03/28/2019 0340   CO2 23 03/28/2019 0340   GLUCOSE 127 (H)  03/28/2019 0340   BUN 17 03/28/2019 0340   CREATININE 0.36 (L) 03/28/2019 0340   CALCIUM 8.3 (L) 03/28/2019 0340   PROT 6.0 (L) 03/27/2019 0430   ALBUMIN 2.2 (L) 03/27/2019 0430   AST 49 (H) 03/27/2019 0430   ALT 13 03/27/2019 0430   ALKPHOS 69 03/27/2019 0430   BILITOT 0.5 03/27/2019 0430   GFRNONAA >60 03/28/2019 0340   GFRAA >60 03/28/2019 0340   Lipase     Component Value Date/Time   LIPASE 11.0 11/02/2018 1103    Studies/Results: Dg Abd Portable 1v  Result Date: 03/27/2019 CLINICAL DATA:  Nausea and vomiting. EXAM: PORTABLE ABDOMEN - 1 VIEW COMPARISON:  Abdominal x-ray dated March 21, 2019. FINDINGS: Stable to slightly worsened dilatation of air-filled small bowel. There is a small amount of air in the rectosigmoid colon, but the colon appears largely decompressed. No acute osseous abnormality. IMPRESSION: 1. Stable to slightly worsened small bowel ileus versus partial obstruction. Electronically Signed   By: Titus Dubin M.D.   On: 03/27/2019 12:51      Kalman Drape , [facility] Surgery 03/28/2019, 8:43 AM  Pager: [phone] Mon-Wed, Friday 7:00am-4:30pm Thurs 7am-11:30am  Consults: [phone]

## 2019-03-28 NOTE — Progress Notes (Signed)
Patient's blood pressure 128/95. Patient c/o pain using pain scale 0-10. Patient stating her pain is at a 9. PRN morphine given. Will continue to monitor

## 2019-03-28 NOTE — Progress Notes (Signed)
Patient has refused to take all po meds today. Nurse has offered to crush them and put them in applesauce or pudding. Patient continues to refuse all po meds. Dr. Tana Coast Ripudeep was notified.

## 2019-03-28 NOTE — Progress Notes (Signed)
[facility]                                                                              Patient Demographics  Theresa Arnold, is a 85 y.o. female, DOB - 1931-08-28, ZOX:[id number]  Admit date - 03/15/2019   Admitting Physician Charlsie Quest, MD  Outpatient Primary MD for the patient is Corwin Levins, MD  Outpatient specialists:   LOS - 13  days   Medical records reviewed and are as summarized below:    Chief Complaint  Patient presents with   Abdominal Pain       Brief summary    Patient is a 85 y.o.femalewith COPD, hyperlipidemia, and chronic sigmoid diverticulitis with known abscess and stricture in the distal sigmoid colon who presented to the ED for evaluation of abdominal pain. Patient follows with GI, Dr. Orvan Falconer, for known sigmoid diverticulitis and abscess. Per GI documentation, case was discussed with IR and fluid collection was not considered amenable to IR drainage. She underwent colonoscopy 01/27/2019 which was limited due to presence of stool. Distal sigmoid stricture was seen. She was reportedly referred to general surgery, [facility] Dr. Cliffton Asters, and has no present plans for surgical intervention.  Patient was seen in the ED on 03/11/2019 at which time a repeat CT abdomen/pelvis with contrast showed an apparent increase in proximal sigmoid colon wall thickening and unchanged 3.5 cm intramural sigmoid colonic wall abscess/collection which was present since 11/02/2018. There was question of low-grade obstruction. She was given a prescription for ciprofloxacin and metronidazole.  Patient stated that she has had continued generalized abdominal pain. She has not had oral intake due to the nausea after eating, unable to take her home medications including the recently prescribed antibiotics. She stated that she has not had any bowel movements despite use of MiraLAX for several days. She reported a 30 pound weight loss since last year. She has not had any  subjective fevers. She reported good urine output without dysuria.   Assessment & Plan    Principal Problem: Acute on chronic/recurrent sigmoid diverticulitis with known abscess, distal sigmoid stricture/perforation, now developing ileus -Patient had presented with intermittent abdominal pain with weight loss secondary to low-grade diverticular stricture. Patient was being followed by GI who had referred to general surgery. -On admission, general surgery was consulted, patient underwent laparoscopic and mobilization of the splenic flexure, laparotomy and sigmoid colectomy) Hartmann procedure with end colostomy by Dr. Daphine Deutscher on 9/9. -Continue TPN until patient is tolerating diet, return of bowel function -Abdominal x-ray on 9/14, showed postoperative ileus, placed on full liquids by surgery  Acute metabolic encephalopathy -No acute issues, at baseline mental status, alert and oriented  COPD Stable, no wheezing, continue duo nebs, Incruse  Oral thrush Completed IV Diflucan.   -Continue nystatin swish and swallow   Severe malnutrition Prealbumin 6.5 on 9/5, BMI 19, continue TPN until bowel function returns  Hiccups Improved, on Reglan, Thorazine as needed for hiccups   Code Status: Full CODE STATUS DVT Prophylaxis:  Lovenox  Family Communication: Discussed all imaging results, lab results, explained to the patient   Disposition Plan: Currently on TPN, DC when cleared  by general surgery  Time Spent in minutes 25 minutes  Procedures:   Acute abdominal series 03/15/2019  Laparoscopy and mobilization of splenic flexure: Laparotomy and sigmoid colectomy(Hartmann procedure)with end colostomy per Dr. Daphine Deutscher 03/22/2019  PICC line 03/21/2019   Consultants:   GI General surgery, Dr. Cliffton Asters  Antimicrobials:   Anti-infectives (From admission, onward)   Start     Dose/Rate Route Frequency Ordered Stop   03/22/19 2200  piperacillin-tazobactam (ZOSYN) IVPB 3.375 g     3.375  g 12.5 mL/hr over 240 Minutes Intravenous Every 8 hours 03/22/19 1725 03/23/19 0224   03/22/19 1115  cefoTEtan (CEFOTAN) 2 g in sodium chloride 0.9 % 100 mL IVPB     2 g 200 mL/hr over 30 Minutes Intravenous On call to O.R. 03/21/19 1829 03/22/19 1320   03/18/19 1900  fluconazole (DIFLUCAN) IVPB 100 mg     100 mg 50 mL/hr over 60 Minutes Intravenous Every 24 hours 03/18/19 1753 03/24/19 1918   03/16/19 0600  piperacillin-tazobactam (ZOSYN) IVPB 3.375 g  Status:  Discontinued     3.375 g 12.5 mL/hr over 240 Minutes Intravenous Every 8 hours 03/15/19 2316 03/22/19 1725   03/15/19 2315  piperacillin-tazobactam (ZOSYN) IVPB 3.375 g     3.375 g 100 mL/hr over 30 Minutes Intravenous NOW 03/15/19 2314 03/16/19 0038         Medications  Scheduled Meds:  acetaminophen  500 mg Oral Q6H   docusate sodium  100 mg Oral BID   enoxaparin (LOVENOX) injection  30 mg Subcutaneous Q24H   feeding supplement  1 Container Oral TID BM   insulin aspart  0-15 Units Subcutaneous Q6H   ketorolac  15 mg Intravenous Q8H   lip balm  1 application Topical BID   mometasone-formoterol  2 puff Inhalation BID   nystatin  5 mL Oral QID   sodium chloride flush  10-40 mL Intracatheter Q12H   sodium chloride flush  3 mL Intravenous Q12H   umeclidinium bromide  1 puff Inhalation Daily   Continuous Infusions:  sodium chloride 250 mL (03/27/19 1825)   chlorproMAZINE (THORAZINE) IV 12.5 mg (03/24/19 2151)   methocarbamol (ROBAXIN) IV 500 mg (03/28/19 0924)   TPN ADULT (ION) 70 mL/hr at 03/27/19 1731   TPN ADULT (ION)     PRN Meds:.sodium chloride, albuterol, alum & mag hydroxide-simeth, chlorproMAZINE (THORAZINE) IV, guaiFENesin-dextromethorphan, ipratropium-albuterol, magic mouthwash, metoCLOPramide (REGLAN) injection, morphine injection, ondansetron **OR** ondansetron (ZOFRAN) IV, phenol, polyethylene glycol, simethicone, sodium chloride flush, sodium chloride flush,  traMADol      Subjective:   Theresa Arnold was seen and examined today.  States today somewhat better from yesterday.  Still having some nausea.  No fevers or chills.  Alert and oriented.  No acute issues overnight.  Patient denies dizziness, chest pain, shortness of breath, new weakness, numbess, tingling.  Objective:   Vitals:   03/28/19 0616 03/28/19 0809 03/28/19 0905 03/28/19 1336  BP: 134/63   (!) 128/94  Pulse: 90   92  Resp: 20   16  Temp: (!) 97.4 F (36.3 C)     TempSrc: Oral     SpO2: 98%  95% 98%  Weight:  46.3 kg    Height:        Intake/Output Summary (Last 24 hours) at 03/28/2019 1521 Last data filed at 03/28/2019 0951 Gross per 24 hour  Intake 1633.82 ml  Output 875 ml  Net 758.82 ml     Wt Readings from Last 3 Encounters:  03/28/19 46.3 kg  03/11/19 46.3 kg  01/27/19 51.7 kg    Physical Exam  General: Alert and oriented x 3, NAD  Eyes  HEENT:    Cardiovascular: S1 S2 clear, RRR. No pedal edema b/l  Respiratory: CTAB, no wheezing, rales or rhonchi  Gastrointestinal: Soft, hypoactive BS, LLQ colostomy, liquid brownish stool in the bag  Ext: no pedal edema bilaterally  Neuro: no new deficits  Musculoskeletal: No cyanosis, clubbing  Skin: No rashes  Psych: Normal affect and demeanor, alert and oriented x3     Data Reviewed:  I have personally reviewed following labs and imaging studies  Micro Results Recent Results (from the past 240 hour(s))  Surgical pcr screen     Status: None   Collection Time: 03/21/19 11:56 PM   Specimen: Nasal Mucosa; Nasal Swab  Result Value Ref Range Status   MRSA, PCR NEGATIVE NEGATIVE Final   Staphylococcus aureus NEGATIVE NEGATIVE Final    Comment: (NOTE) The Xpert SA Assay (FDA approved for NASAL specimens in patients 71 years of age and older), is one component of a comprehensive surveillance program. It is not intended to diagnose infection nor to guide or monitor treatment. Performed at [facility], [address] [place], [place] [address]     Radiology Reports Dg Abd 1 View  Result Date: 03/18/2019 CLINICAL DATA:  History of sigmoid diverticulitis with associated small abscess by CT scan 03/11/2019. Possible colon stricture. EXAM: ABDOMEN - 1 VIEW COMPARISON:  Chest and two views abdomen 03/15/2019. CT abdomen and pelvis 03/11/2019. FINDINGS: No evidence of free intraperitoneal air is seen on supine films. There is mild gaseous distention of the colon and scattered small bowel loops, improved since the comparison plain films. There is some gas in the rectum. No abnormal abdominal calcification. No acute bony abnormality. IMPRESSION: Mild gaseous distention of the colon appears improved compared to the most recent exam. Electronically Signed   By: Drusilla Kanner M.D.   On: 03/18/2019 08:27   Ct Abdomen Pelvis W Contrast  Result Date: 03/11/2019 CLINICAL DATA:  85 year old female with acute abdominal and pelvic pain. EXAM: CT ABDOMEN AND PELVIS WITH CONTRAST TECHNIQUE: Multidetector CT imaging of the abdomen and pelvis was performed using the standard protocol following bolus administration of intravenous contrast. CONTRAST:  OMNIPAQUE IOHEXOL 300 MG/ML  SOLN COMPARISON:  12/28/2018 FINDINGS: Lower chest: No acute abnormality. Hepatobiliary: The liver and gallbladder are unremarkable except for small hepatic cysts. No biliary dilatation. Pancreas: Unremarkable Spleen: Unremarkable Adrenals/Urinary Tract: The kidneys, adrenal glands and bladder are unremarkable except for stable small probable LEFT renal cyst. Stomach/Bowel: Wall thickening of the proximal sigmoid colon is noted and appears slightly increased from 12/28/2018 and may represent increasing diverticulitis. A 3.5 cm intramural collection along the LEFT sigmoid colonic wall is again noted. Mild colonic distension proximal to the sigmoid colon is noted with fluid and gas. There is no evidence of small bowel  obstruction. Vascular/Lymphatic: Aortic atherosclerosis. No enlarged abdominal or pelvic lymph nodes. Reproductive: Uterus and bilateral adnexa are unremarkable. Other: No ascites, pneumoperitoneum or new collection. Musculoskeletal: No acute or suspicious bony abnormalities. IMPRESSION: 1. Apparent increase in proximal sigmoid colon wall thickening which may represent flare up of acute diverticulitis at this area. Unchanged 3.5 cm intramural sigmoid colonic wall abscess/collection, which has been present since 11/02/2018. Relative distension of the colon proximal to the sigmoid colon which may represent a low grade obstruction. No evidence of pneumoperitoneum. 2.  Aortic Atherosclerosis (ICD10-I70.0). Electronically Signed   By: Tinnie Gens  Hu M.D.   On: 03/11/2019 21:15   Dg Abd Acute 2+v W 1v Chest  Result Date: 03/20/2019 CLINICAL DATA:  Abdominal pain in a patient with a history of chronic sigmoid diverticulitis and a small abscess on CT scan 03/11/2019. EXAM: DG ABDOMEN ACUTE W/ 1V CHEST COMPARISON:  CT abdomen and pelvis 03/11/2019. Chest and two views abdomen 03/15/2019 and two views of the abdomen 03/18/2019. FINDINGS: Single-view of the chest demonstrates clear lungs and normal heart size. Trace pleural effusions. No pneumothorax. Atherosclerosis noted. Two views of the abdomen show no free intraperitoneal air. Multiple gas-filled but nondilated loops of small and large bowel are present. No abnormal abdominal calcification or focal bony abnormality. IMPRESSION: Trace bilateral pleural effusions. Multiple gas-filled but nondilated loops of small and large bowel may be due to ileus. Electronically Signed   By: Drusilla Kanner M.D.   On: 03/20/2019 11:15   Dg Abd Acute W/chest  Result Date: 03/15/2019 CLINICAL DATA:  Left lower quadrant pain. EXAM: DG ABDOMEN ACUTE W/ 1V CHEST COMPARISON:  Abdominal CT March 11, 2019 FINDINGS: Sub pathologic gaseous distension of small bowel loops and colon which may  represent a low-grade/intermediate obstruction at the level of the distal colon, or ileus. No evidence of free gas in the abdomen seen radiographically. IMPRESSION: Sub pathologic gaseous distension of small bowel loops and colon which may represent a low-grade/intermediate obstruction at the level of the distal colon, or ileus. Electronically Signed   By: Ted Mcalpine M.D.   On: 03/15/2019 18:37   Dg Abd Portable 1v  Result Date: 03/27/2019 CLINICAL DATA:  Nausea and vomiting. EXAM: PORTABLE ABDOMEN - 1 VIEW COMPARISON:  Abdominal x-ray dated March 21, 2019. FINDINGS: Stable to slightly worsened dilatation of air-filled small bowel. There is a small amount of air in the rectosigmoid colon, but the colon appears largely decompressed. No acute osseous abnormality. IMPRESSION: 1. Stable to slightly worsened small bowel ileus versus partial obstruction. Electronically Signed   By: [name] M.D.   On: 03/27/2019 12:51   Dg Abd Portable 1v  Result Date: 03/21/2019 CLINICAL DATA:  85 year old admitted 03/15/2019 for chronic recurrent sigmoid diverticulitis. Follow-up possible ileus. EXAM: PORTABLE ABDOMEN - 1 VIEW COMPARISON:  03/20/2019 and earlier, including CT abdomen and pelvis 03/11/2019. FINDINGS: Since yesterday's examination, many of the small bowel loops have now become moderately distended with gas. Gas is present within nondistended transverse colon. No suggestion of free air on the supine image. No visible opaque urinary tract calculi. IMPRESSION: Worsening of small bowel ileus versus developing partial small bowel obstruction. Electronically Signed   By: Hulan Saas M.D.   On: 03/21/2019 14:09   [place] Ekg Site Rite  Result Date: 03/21/2019 If Site Rite image not attached, placement could not be confirmed due to current cardiac rhythm.   Lab Data:  CBC: Recent Labs  Lab 03/22/19 0300 03/23/19 0400 03/24/19 0908 03/25/19 0418 03/27/19 0430 03/28/19 0340  WBC 5.2 11.6*  14.8* 12.2* 9.7 10.4  NEUTROABS 2.6  --   --   --  6.7  --   HGB 10.8* 11.4* 9.3* 9.3* 9.6* 8.9*  HCT 33.5* 35.3* 29.5* 29.2* 29.5* 27.9*  MCV 97.7 98.9 100.0 99.3 97.4 97.9  PLT 188 180 160 175 213 198   Basic Metabolic Panel: Recent Labs  Lab 03/23/19 0400 03/24/19 0340 03/25/19 0418 03/26/19 0501 03/27/19 0430 03/28/19 0340  NA 138 140 136 135 134* 133*  K 4.0 3.9 4.5 4.4 4.3 4.4  CL 102 106  103 102 101 101  CO2 27 26 27 26 25 23   GLUCOSE 282* 130* 169* 131* 119* 127*  BUN 11 13 15 12 14 17   CREATININE 0.57 0.51 0.48 0.30* 0.41* 0.36*  CALCIUM 8.0* 8.1* 8.1* 8.1* 8.4* 8.3*  MG 2.2 2.2 2.2 2.3 2.2  --   PHOS 2.5 3.0 3.6 4.3 4.1  --    GFR: Estimated Creatinine Clearance: 38.3 mL/min (A) (by C-G formula based on SCr of 0.36 mg/dL (L)). Liver Function Tests: Recent Labs  Lab 03/23/19 0400 03/24/19 0340 03/25/19 0418 03/26/19 0501 03/27/19 0430  AST 14* 16 16 18  49*  ALT 6 5 5 6 13   ALKPHOS 31* 32* 41 45 69  BILITOT 0.5 0.2* 0.3 0.4 0.5  PROT 5.4* 5.0* 5.2* 5.7* 6.0*  ALBUMIN 2.4* 2.2* 2.2* 2.2* 2.2*   No results for input(s): LIPASE, AMYLASE in the last 168 hours. No results for input(s): AMMONIA in the last 168 hours. Coagulation Profile: No results for input(s): INR, PROTIME in the last 168 hours. Cardiac Enzymes: No results for input(s): CKTOTAL, CKMB, CKMBINDEX, TROPONINI in the last 168 hours. BNP (last 3 results) No results for input(s): PROBNP in the last 8760 hours. HbA1C: No results for input(s): HGBA1C in the last 72 hours. CBG: Recent Labs  Lab 03/27/19 1754 03/28/19 0007 03/28/19 0612 03/28/19 0619 03/28/19 1147  GLUCAP 140* 125* 125* 120* 108*   Lipid Profile: Recent Labs    03/27/19 0430  TRIG 65   Thyroid Function Tests: No results for input(s): TSH, T4TOTAL, FREET4, T3FREE, THYROIDAB in the last 72 hours. Anemia Panel: No results for input(s): VITAMINB12, FOLATE, FERRITIN, TIBC, IRON, RETICCTPCT in the last 72 hours. Urine  analysis:    Component Value Date/Time   COLORURINE STRAW (A) 03/11/2019 1906   APPEARANCEUR CLEAR 03/11/2019 1906   LABSPEC 1.003 (L) 03/11/2019 1906   PHURINE 8.0 03/11/2019 1906   GLUCOSEU NEGATIVE 03/11/2019 1906   GLUCOSEU NEGATIVE 11/02/2018 1103   HGBUR NEGATIVE 03/11/2019 1906   BILIRUBINUR NEGATIVE 03/11/2019 1906   KETONESUR NEGATIVE 03/11/2019 1906   PROTEINUR NEGATIVE 03/11/2019 1906   UROBILINOGEN 0.2 11/02/2018 1103   NITRITE NEGATIVE 03/11/2019 1906   LEUKOCYTESUR NEGATIVE 03/11/2019 1906     Anabella Capshaw M.D. [facility] 03/28/2019, 3:21 PM  Pager: [phone] Between 7am to 7pm - call Pager - [phone]  After 7pm go to [email or web address] - password [email or web address]  Call night coverage person covering after 7pm

## 2019-03-29 LAB — BASIC METABOLIC PANEL
Anion gap: 5 (ref 5–15)
BUN: 18 mg/dL (ref 8–23)
CO2: 24 mmol/L (ref 22–32)
Calcium: 8.3 mg/dL — ABNORMAL LOW (ref 8.9–10.3)
Chloride: 104 mmol/L (ref 98–111)
Creatinine, Ser: 0.33 mg/dL — ABNORMAL LOW (ref 0.44–1.00)
GFR calc Af Amer: >60 mL/min (ref >60)
GFR calc non Af Amer: >60 mL/min (ref >60)
Glucose, Bld: 108 mg/dL — ABNORMAL HIGH (ref 70–99)
Potassium: 4.3 mmol/L (ref 3.5–5.1)
Sodium: 133 mmol/L — ABNORMAL LOW (ref 135–145)

## 2019-03-29 LAB — GLUCOSE, CAPILLARY
Glucose-Capillary: 103 mg/dL — ABNORMAL HIGH (ref 70–99)
Glucose-Capillary: 109 mg/dL — ABNORMAL HIGH (ref 70–99)
Glucose-Capillary: 120 mg/dL — ABNORMAL HIGH (ref 70–99)
Glucose-Capillary: 141 mg/dL — ABNORMAL HIGH (ref 70–99)

## 2019-03-29 MED ORDER — TRAVASOL 10 % IV SOLN
INTRAVENOUS | Status: AC
  Administered 2019-03-29: 18:00:00 via INTRAVENOUS
  Filled 2019-03-29: qty 756

## 2019-03-29 MED ORDER — OXYCODONE HCL 5 MG/5ML PO SOLN
5.0000 mg | ORAL | Status: DC | PRN
  Administered 2019-03-29 – 2019-04-02 (×7): 5 mg via ORAL
  Filled 2019-03-29 (×7): qty 5

## 2019-03-29 MED ORDER — ACETAMINOPHEN 160 MG/5ML PO SOLN
650.0000 mg | Freq: Four times a day (QID) | ORAL | Status: DC
  Administered 2019-03-29 – 2019-04-02 (×15): 650 mg via ORAL
  Filled 2019-03-29 (×16): qty 20.3

## 2019-03-29 MED ORDER — MORPHINE SULFATE (PF) 2 MG/ML IV SOLN: 1.0000 mg | INTRAVENOUS | Status: DC | PRN

## 2019-03-29 MED ORDER — DOCUSATE SODIUM 50 MG/5ML PO LIQD
50.0000 mg | Freq: Every day | ORAL | Status: DC
  Administered 2019-03-29 – 2019-04-02 (×3): 50 mg via ORAL
  Filled 2019-03-29 (×5): qty 10

## 2019-03-29 NOTE — Care Management Important Message (Signed)
Important Message  Patient Details IM Letter given to [name] SW to present to the Patient Name: [name] MRN: [id number] Date of Birth: 03/02/1932   Medicare Important Message Given:  Yes     Kerin Salen 03/29/2019, 10:44 AM

## 2019-03-29 NOTE — Progress Notes (Signed)
Patient was in bathroom doing combing her hair during time for scheduled Robaxin, would not come out to get scheduled meds, meds given past time scheduled patient still declining any scheduled medications in pill form. Patient called out during change of shift for pain medications and was informed during shift change that she wanted pain meds, pain meds were taken to patient during change of shift and patient had c/o waiting 2 hours for pain medications even though it had only been a few minutes. Dawson Bills, RN

## 2019-03-29 NOTE — Consult Note (Signed)
[place] Nurse ostomy follow up Stoma type/location: LLQ end colostomy Stomal assessment/size: 1 1/2" pink patent and scant brown stool in pouch.  Positive for ileus.  Improving.  On liquid diet Peristomal assessment: not assessed Treatment options for stomal/peristomal skin: barrier ring and 2 piece pouch.  Output scant brown stool Ostomy pouching: 2pc. Pouch with barrier ring  Education provided: Patient able to open and close.  Anticipates discharge to rehab  Enrolled patient in [place] Start Discharge program: Yes [place] team will follow.  Domenic Moras MSN, RN, FNP-BC [facility] Wound, Ostomy, Continence Nurse Pager [phone]

## 2019-03-29 NOTE — Progress Notes (Signed)
Physical Therapy Treatment Patient Details Name: Theresa Arnold MRN: [id number] DOB: 04-29-1932 Today's Date: 03/29/2019    History of Present Illness 85yo female who presented to the ED with abdominal pain. Admitted for chronic/recurrent sigmoid diverticulitis with known abscess and distal sigmoid stricture. 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 Pt s/p laparoscopic and mobilization of the splenic flexure, laparotomy and sigmoid colectomy (Hartmann procedure) with end colostomyscess on 03/22/19    PT Comments    Pt was agreeable to working with PT. She walked ~60 feet with use of a RW. Pt is weak and fatigues easily. She reports having significant abdominal pain- "they say I can't have anything for pain." Ambulation distance limited by nausea on today. Will continue to follow and progress activity as tolerated. May need to consider ST rehab placement, depending no progress.    Follow Up Recommendations  Home health PT;Supervision/Assistance - 24 hour vs SNF(depending on progress and pt/family decision)     Equipment Recommendations  Rolling walker with 5" wheels    Recommendations for Other Services       Precautions / Restrictions Precautions Precautions: Fall Precaution Comments: abd sg; L colostomy Restrictions Weight Bearing Restrictions: No    Mobility  Bed Mobility               General bed mobility comments: pt in chair  Transfers Overall transfer level: Needs assistance Equipment used: Rolling walker (2 wheeled) Transfers: Sit to/from Stand Sit to Stand: Min assist         General transfer comment: VCs safety, hand placement. Assist to steady.  Ambulation/Gait Ambulation/Gait assistance: Min assist Gait Distance (Feet): 60 Feet Assistive device: Rolling walker (2 wheeled) Gait  Pattern/deviations: Decreased step length - right;Decreased step length - left;Decreased stride length     General Gait Details: Cues for safety. Assist to steady pt and maneuver RW. Slow gait speed. Pt c/o nausea so distance limited   Stairs             Wheelchair Mobility    Modified Rankin (Stroke Patients Only)       Balance Overall balance assessment: Needs assistance         Standing balance support: Bilateral upper extremity supported Standing balance-Leahy Scale: Poor                              Cognition Arousal/Alertness: Awake/alert Behavior During Therapy: WFL for tasks assessed/performed Overall Cognitive Status: Within Functional Limits for tasks assessed                                        Exercises      General Comments        Pertinent Vitals/Pain Pain Assessment: Faces Faces Pain Scale: Hurts even more Pain Location: abdomen Pain Descriptors / Indicators: Discomfort;Guarding Pain Intervention(s): Monitored during session;Limited activity within patient's tolerance    Home Living                      Prior Function            PT Goals (current goals can now be found in the care plan section) Progress towards PT goals: Progressing toward goals    Frequency    Min  3X/week      PT Plan Current plan remains appropriate    Co-evaluation              AM-PAC PT "6 Clicks" Mobility   Outcome Measure  Help needed turning from your back to your side while in a flat bed without using bedrails?: A Little Help needed moving from lying on your back to sitting on the side of a flat bed without using bedrails?: A Little Help needed moving to and from a bed to a chair (including a wheelchair)?: A Little Help needed standing up from a chair using your arms (e.g., wheelchair or bedside chair)?: A Little Help needed to walk in hospital room?: A Little Help needed climbing 3-5 steps with a  railing? : A Little 6 Click Score: 18    End of Session   Activity Tolerance: Patient limited by fatigue(limited by nausea) Patient left: in chair;with call bell/phone within reach   PT Visit Diagnosis: Unsteadiness on feet (R26.81);Muscle weakness (generalized) (M62.81) Pain - part of body: (abdomen)     Time: [id number] PT Time Calculation (min) (ACUTE ONLY): 16 min  Charges:  $Gait Training: 8-22 mins                        Weston Anna, [place] Pager: [phone] Office: [phone]

## 2019-03-29 NOTE — Progress Notes (Signed)
PHARMACY NOTE -  Lovenox  Pharmacy has been assisting with dosing of lovenox for VTE prophylaxis . Dosage remains stable at 30 mg SQ q24h (weight 46 kg) and need for further dosage adjustment appears unlikely at present.    Will sign off at this time.  Please reconsult if a change in clinical status warrants re-evaluation of dosage.  Dia Sitter, PharmD, BCPS 03/29/2019 9:13 AM

## 2019-03-29 NOTE — Progress Notes (Signed)
PHARMACY - ADULT TOTAL PARENTERAL NUTRITION CONSULT NOTE   Pharmacy Consult for TPN Indication: ileus, malnutrition- intolerance to enteral feeding  Patient Measurements: Height: 5\' 2"  (157.5 cm) Weight: 102 lb (46.3 kg) IBW/kg (Calculated) : 50.1 TPN AdjBW (KG): 47 Body mass index is 18.66 kg/m. Usual Weight:   Insulin Requirements: on moderate sSSI q6hr (none in 24 hrs), with 10 units insulin in TPN bag  Current Nutrition: TPN - 9/15: started on Boost  1 container TID  IVF: none  Central access: PICC  TPN start date: 03/21/19  ASSESSMENT                                                                                                          HPI: Patient is an 85 y.o F with hx recurrent diverticulitis presented to the ED on 03/15/19 with c/o abd pain.  Abd x-ray on 9/2 showed findings with suspicion for obstruction at distal colon or ileus. CCS recom possible colectomy this week (9/8-9/11).  Pharmacy is consulted to start TPN on 9/8 for ileus and malnutrition.  Significant events:  9/3: clear liquids 9/4: changed to full liquids 9/9: Laparoscopy and mobilization of the splenic flexure; laparotomy and sigmoid colectomy with end colostomy 9/10: clear liquid diet 9/11: TPN to goal rate 9/12: NPO, reduced K, Mag & Phos in TPN 9/13: resume CL diet, reduced Phos further, added insulin 9/14: started on Boost  Today:   Glucose (goal <150): wnl  Electrolytes: Na low but stable at 133 with Na content increased in TPN bag on 9/15, K 4.3, Mag 2.2 and Phos 4.1 on 9/14, CorrCa 9.74; CL and CO2 wnl  Renal: scr low  LFTs: AST slightly elevated at 49 on 9/14  TGs: 91 (9/9), 65 (9/14)  Prealbumin: 6.5 (9/5), 5.6 (9/9), 8.1 (9/14)  NUTRITIONAL GOALS                                                                                             RD recs (9/15):  Kcal:  1500-1700 Protein:  70-80g Fluid:  1.7L/day  Custom TPN at goal rate of 70 ml/hr provides: -  76 g/day protein ( 45   g/L) -  44 g/day Lipid  (26 g/L) -  269  g/day Dextrose (16 %) -  1654 Kcal/day  PLAN  At 1800 today:  Continue custom TPN at goal rate of 70 ml/hr -  Provides 76 g of protein, 269 g of dextrose, and 44 g of lipids which provides 1654 kCals per day, meeting 100 % of goal kcal and goal AA  Electrolytes in TPN: Standard except Na at 150 mEq/L; Cl:Ac ratio 1:1  TPN to contain standard multivitamins and trace elements.  Continue moderate SSI q6h, continue 10 units insulin per bag  TPN lab panels on Mondays & Thursdays.  F/u daily.  Treena Cosman P 03/29/2019,7:30 AM

## 2019-03-29 NOTE — Progress Notes (Signed)
PROGRESS NOTE  Theresa Arnold [id number] DOB: 1931/08/05 DOA: 03/15/2019 PCP: Biagio Borg, MD   LOS: 14 days   Brief narrative: Patient is a 85 y.o.femalewith COPD, hyperlipidemia, and chronic sigmoid diverticulitis with known abscess and stricture in the distal sigmoid colon who presented to the ED for evaluation of abdominal pain. Patient follows with GI, Dr. Tarri Glenn, for known sigmoid diverticulitis and abscess. Per GI documentation, case was discussed with IR and fluid collection was not considered amenable to IR drainage. She underwent colonoscopy 01/27/2019 which was limited due to presence of stool. Distal sigmoid stricture was seen. She was reportedly referred to general surgery, CCS Dr. Dema Severin, and has no present plans for surgical intervention.  Patient was seen in the ED on 03/11/2019 at which time a repeat CT abdomen/pelvis with contrast showed an apparent increase in proximal sigmoid colon wall thickening and unchanged 3.5 cm intramural sigmoid colonic wall abscess/collection which was present since 11/02/2018. There was question of low-grade obstruction. She was given a prescription for ciprofloxacin and metronidazole.  Patient stated that she has had continued generalized abdominal pain. She has not had oral intake due to the nausea after eating, unable to take her home medications including the recently prescribed antibiotics. She stated that she has not had any bowel movements despite use of MiraLAX for several days. She reported a 30 pound weight loss since last year. She has not had any subjective fevers. She reported good urine output without dysuria.  Assessment/Plan:  Principal Problem:   Sigmoid stricture (HCC) Active Problems:   COPD (chronic obstructive pulmonary disease) (HCC)   Diverticulitis of sigmoid colon   Abnormal CT scan, gastrointestinal tract   Colonic partial obstruction from sigmoid stricture   Polyp of sigmoid colon   Oral thrush  Hiccups   Hypoxia  Partial colonic obstruction from sigmoid stricture thought to be secondary to chronic recurrent diverticulitis. Patient underwent laparoscopic and mobilization of the splenic flexure, laparotomy and sigmoid colectomy) Hartmann procedure with end colostomy by Dr. Hassell Done on 9/9. Continue TPN until patient is tolerating diet, return of bowel function. Abdominal x-ray on 9/14, showed postoperative ileus, placed on full liquids by surgery.  Improving.  Acute metabolic encephalopathy -Now improved mental status at baseline.  COPD Stable, compensated.  Continue duo nebs increase.  Oral thrush Completed IV Diflucan.  Continue nystatin swish and swallow   Severe protein calorie malnutrition malnutrition Prealbumin 6.5 on 9/5, BMI 19, continue TPN until bowel function improves and oral intake is adequate  Hiccups Improved, on Reglan, Thorazine as needed for hiccups   VTE Prophylaxis: Lovenox  Code Status: Full code  Family Communication: None  Disposition Plan: Unknown at this time.   Consultants:  General surgery  GI  Procedures:  Acute abdominal series 03/15/2019  Laparoscopy and mobilization of splenic flexure: Laparotomy and sigmoid colectomy(Hartmann procedure)with end colostomy per Dr. Hassell Done 03/22/2019  PICC line 03/21/2019  Antibiotics:  Zosyn 09/02-09/10,  Diflucan 09/05-09/12  Anti-infectives (From admission, onward)   Start     Dose/Rate Route Frequency Ordered Stop   03/22/19 2200  piperacillin-tazobactam (ZOSYN) IVPB 3.375 g     3.375 g 12.5 mL/hr over 240 Minutes Intravenous Every 8 hours 03/22/19 1725 03/23/19 0224   03/22/19 1115  cefoTEtan (CEFOTAN) 2 g in sodium chloride 0.9 % 100 mL IVPB     2 g 200 mL/hr over 30 Minutes Intravenous On call to O.R. 03/21/19 1829 03/22/19 1320   03/18/19 1900  fluconazole (DIFLUCAN) IVPB 100 mg  100 mg 50 mL/hr over 60 Minutes Intravenous Every 24 hours 03/18/19 1753 03/24/19 1918   03/16/19  0600  piperacillin-tazobactam (ZOSYN) IVPB 3.375 g  Status:  Discontinued     3.375 g 12.5 mL/hr over 240 Minutes Intravenous Every 8 hours 03/15/19 2316 03/22/19 1725   03/15/19 2315  piperacillin-tazobactam (ZOSYN) IVPB 3.375 g     3.375 g 100 mL/hr over 30 Minutes Intravenous NOW 03/15/19 2314 03/16/19 0038      Subjective: Nursing staff reported poor oral intake and refusing oral medication.  Patient denies nausea or vomiting.  Denies any fever, chills or rigor.  Denies shortness of breath cough or chest pain.  Objective: Vitals:   03/29/19 0430 03/29/19 0935  BP: (!) 115/57   Pulse: 81   Resp: 17   Temp: 98.9 F (37.2 C)   SpO2: 98% 92%    Intake/Output Summary (Last 24 hours) at 03/29/2019 1147 Last data filed at 03/29/2019 1003 Gross per 24 hour  Intake 2617.7 ml  Output 980 ml  Net 1637.7 ml   Filed Weights   03/23/19 0500 03/24/19 0438 03/28/19 0809  Weight: 50.4 kg 49.4 kg 46.3 kg   Body mass index is 18.66 kg/m.   Physical Exam: GENERAL: Patient is alert awake and oriented. Not in obvious distress.  Thinly built HENT: No scleral pallor or icterus. Pupils equally reactive to light. Oral mucosa is moist NECK: is supple, no palpable thyroid enlargement. CHEST: Clear to auscultation. No crackles or wheezes. Non tender on palpation. Diminished breath sounds bilaterally. CVS: S1 and S2 heard, no murmur. Regular rate and rhythm. No pericardial rub. ABDOMEN: Soft, hypoactive bowel sounds, left lower quadrant colostomy with liquid  brownish fluid. EXTREMITIES: No edema. CNS: Cranial nerves are intact. No focal motor or sensory deficits. SKIN: warm and dry without rashes.  Data Review: I have personally reviewed the following laboratory data and studies,  CBC: Recent Labs  Lab 03/23/19 0400 03/24/19 0908 03/25/19 0418 03/27/19 0430 03/28/19 0340  WBC 11.6* 14.8* 12.2* 9.7 10.4  NEUTROABS  --   --   --  6.7  --   HGB 11.4* 9.3* 9.3* 9.6* 8.9*  HCT 35.3*  29.5* 29.2* 29.5* 27.9*  MCV 98.9 100.0 99.3 97.4 97.9  PLT 180 160 175 213 [id number]   Basic Metabolic Panel: Recent Labs  Lab 03/23/19 0400 03/24/19 0340 03/25/19 0418 03/26/19 0501 03/27/19 0430 03/28/19 0340 03/29/19 0434  NA 138 140 136 135 134* 133* 133*  K 4.0 3.9 4.5 4.4 4.3 4.4 4.3  CL 102 106 103 102 101 101 104  CO2 27 26 27 26 25 23 24   GLUCOSE 282* 130* 169* 131* 119* 127* 108*  BUN 11 13 15 12 14 17 18   CREATININE 0.57 0.51 0.48 0.30* 0.41* 0.36* 0.33*  CALCIUM 8.0* 8.1* 8.1* 8.1* 8.4* 8.3* 8.3*  MG 2.2 2.2 2.2 2.3 2.2  --   --   PHOS 2.5 3.0 3.6 4.3 4.1  --   --    Liver Function Tests: Recent Labs  Lab 03/23/19 0400 03/24/19 0340 03/25/19 0418 03/26/19 0501 03/27/19 0430  AST 14* 16 16 18  49*  ALT 6 5 5 6 13   ALKPHOS 31* 32* 41 45 69  BILITOT 0.5 0.2* 0.3 0.4 0.5  PROT 5.4* 5.0* 5.2* 5.7* 6.0*  ALBUMIN 2.4* 2.2* 2.2* 2.2* 2.2*   No results for input(s): LIPASE, AMYLASE in the last 168 hours. No results for input(s): AMMONIA in the last 168 hours. Cardiac Enzymes: No  results for input(s): CKTOTAL, CKMB, CKMBINDEX, TROPONINI in the last 168 hours. BNP (last 3 results) No results for input(s): BNP in the last 8760 hours.  ProBNP (last 3 results) No results for input(s): PROBNP in the last 8760 hours.  CBG: Recent Labs  Lab 03/28/19 1147 03/28/19 1756 03/29/19 0033 03/29/19 0615 03/29/19 1135  GLUCAP 108* 110* 120* 109* 141*   Recent Results (from the past 240 hour(s))  Surgical pcr screen     Status: None   Collection Time: 03/21/19 11:56 PM   Specimen: Nasal Mucosa; Nasal Swab  Result Value Ref Range Status   MRSA, PCR NEGATIVE NEGATIVE Final   Staphylococcus aureus NEGATIVE NEGATIVE Final    Comment: (NOTE) The Xpert SA Assay (FDA approved for NASAL specimens in patients 61 years of age and older), is one component of a comprehensive surveillance program. It is not intended to diagnose infection nor to guide or monitor treatment.  Performed at [facility], [place] [address] [place], [place] [address]      Studies: Dg Abd Portable 1v  Result Date: 03/27/2019 CLINICAL DATA:  Nausea and vomiting. EXAM: PORTABLE ABDOMEN - 1 VIEW COMPARISON:  Abdominal x-ray dated March 21, 2019. FINDINGS: Stable to slightly worsened dilatation of air-filled small bowel. There is a small amount of air in the rectosigmoid colon, but the colon appears largely decompressed. No acute osseous abnormality. IMPRESSION: 1. Stable to slightly worsened small bowel ileus versus partial obstruction. Electronically Signed   By: Titus Dubin M.D.   On: 03/27/2019 12:51    Scheduled Meds: . acetaminophen (TYLENOL) oral liquid 160 mg/5 mL  650 mg Oral Q6H  . docusate  50 mg Oral Daily  . enoxaparin (LOVENOX) injection  30 mg Subcutaneous Q24H  . feeding supplement  1 Container Oral TID BM  . insulin aspart  0-15 Units Subcutaneous Q6H  . lip balm  1 application Topical BID  . mometasone-formoterol  2 puff Inhalation BID  . nystatin  5 mL Oral QID  . sodium chloride flush  10-40 mL Intracatheter Q12H  . sodium chloride flush  3 mL Intravenous Q12H  . umeclidinium bromide  1 puff Inhalation Daily    Continuous Infusions: . sodium chloride 250 mL (03/27/19 1825)  . chlorproMAZINE (THORAZINE) IV 12.5 mg (03/24/19 2151)  . methocarbamol (ROBAXIN) IV 500 mg (03/29/19 1003)  . TPN ADULT (ION) 70 mL/hr at 03/28/19 1738  . TPN ADULT (ION)       Flora Lipps, MD  [facility] 03/29/2019

## 2019-03-29 NOTE — Progress Notes (Signed)
[facility]/Trauma Progress Note  7 Days Post-Op   Assessment/Plan Principal Problem: Sigmoid stricture (HCC) Active Problems: COPD (chronic obstructive pulmonary disease) (HCC) Diverticulitis of sigmoid colon Abnormal CT scan, gastrointestinal tract Colonic partial obstruction from sigmoid stricture Polyp of sigmoid colon Oral thrush  Partial colonic obstruction from sigmoid stricture presumed due to chronic recurrent diverticulitis -S/P laparoscopic and mobilization of the splenic flexure, laparotomy and sigmoid colectomy Jeanette Caprice procedure) with end colostomy, Dr. Hassell Done, 03/22/2019 -Pathology benign showed diverticulitis Postop ileus - X-ray 9/14 showed SBO versus ileus - appears to be resolving Severe malnutrition -Prealbumin8.1on09/14 -TPN  FEN:FLD,TPN VTE: SCD's, lovenox [id number] 09/02-09/10, Diflucan 09/05-09/12,WBC 10.4 9/15, afebrile Foley:Removed POD 1 Follow up:Dr. Hassell Done  DISPO:Pain control, mobilize, IS, PT, continue TPN until patient is tolerating a regular diet.P.o. meds as solution if possible    LOS: 14 days    Subjective: CC: Abdominal pain  Per nurse patient is refusing all p.o. medications.  Patient also did not take anything by mouth yesterday including water.  I spoke with patient about her p.o. intake and importance of nutrition.  She states she wants to go home or to rehab.  I told her in order to do that she is got to start eating.  She states she is afraid to take anything by mouth for fear of nausea or vomiting.  Objective: Vital signs in last 24 hours: Temp:  [98.7 F (37.1 C)-98.9 F (37.2 C)] 98.9 F (37.2 C) (09/16 0430) Pulse Rate:  [81-92] 81 (09/16 0430) Resp:  [16-18] 17 (09/16 0430) BP: (115-128)/(57-94) 115/57 (09/16 0430) SpO2:  [95 %-99 %] 98 % (09/16 0430) Last BM Date: 03/28/19  Intake/Output from previous day: 09/15 0701 - 09/16 0700 In: 2014.7 [I.V.:1820.2; IV  Piggyback:194.5] Out: 980 [Urine:850; Stool:130] Intake/Output this shift: No intake/output data recorded.  PE: Gen: Alert, NAD, pleasant, cooperative Pulm: Rate and effort normal Abd: Soft, no distention,+bowel sounds, midline with glue intact and is without signs of infection, LLQ colostomy, stoma is pink, large amount of liquid dark green stool in bag. Mild RLQ TTP without guarding, no peritonitis Skin: no rashes noted, warm and dry   Anti-infectives: Anti-infectives (From admission, onward)   Start     Dose/Rate Route Frequency Ordered Stop   03/22/19 2200  piperacillin-tazobactam (ZOSYN) IVPB 3.375 g     3.375 g 12.5 mL/hr over 240 Minutes Intravenous Every 8 hours 03/22/19 1725 03/23/19 0224   03/22/19 1115  cefoTEtan (CEFOTAN) 2 g in sodium chloride 0.9 % 100 mL IVPB     2 g 200 mL/hr over 30 Minutes Intravenous On call to O.R. 03/21/19 1829 03/22/19 1320   03/18/19 1900  fluconazole (DIFLUCAN) IVPB 100 mg     100 mg 50 mL/hr over 60 Minutes Intravenous Every 24 hours 03/18/19 1753 03/24/19 1918   03/16/19 0600  piperacillin-tazobactam (ZOSYN) IVPB 3.375 g  Status:  Discontinued     3.375 g 12.5 mL/hr over 240 Minutes Intravenous Every 8 hours 03/15/19 2316 03/22/19 1725   03/15/19 2315  piperacillin-tazobactam (ZOSYN) IVPB 3.375 g     3.375 g 100 mL/hr over 30 Minutes Intravenous NOW 03/15/19 2314 03/16/19 0038      Lab Results:  Recent Labs    03/27/19 0430 03/28/19 0340  WBC 9.7 10.4  HGB 9.6* 8.9*  HCT 29.5* 27.9*  PLT 213 198   BMET Recent Labs    03/28/19 0340 03/29/19 0434  NA 133* 133*  K 4.4 4.3  CL 101 104  CO2 23 24  GLUCOSE 127* 108*  BUN 17 18  CREATININE 0.36* 0.33*  CALCIUM 8.3* 8.3*   PT/INR No results for input(s): LABPROT, INR in the last 72 hours. CMP     Component Value Date/Time   NA 133 (L) 03/29/2019 0434   K 4.3 03/29/2019 0434   CL 104 03/29/2019 0434   CO2 24 03/29/2019 0434   GLUCOSE 108 (H) 03/29/2019 0434    BUN 18 03/29/2019 0434   CREATININE 0.33 (L) 03/29/2019 0434   CALCIUM 8.3 (L) 03/29/2019 0434   PROT 6.0 (L) 03/27/2019 0430   ALBUMIN 2.2 (L) 03/27/2019 0430   AST 49 (H) 03/27/2019 0430   ALT 13 03/27/2019 0430   ALKPHOS 69 03/27/2019 0430   BILITOT 0.5 03/27/2019 0430   GFRNONAA >60 03/29/2019 0434   GFRAA >60 03/29/2019 0434   Lipase     Component Value Date/Time   LIPASE 11.0 11/02/2018 1103    Studies/Results: Dg Abd Portable 1v  Result Date: 03/27/2019 CLINICAL DATA:  Nausea and vomiting. EXAM: PORTABLE ABDOMEN - 1 VIEW COMPARISON:  Abdominal x-ray dated March 21, 2019. FINDINGS: Stable to slightly worsened dilatation of air-filled small bowel. There is a small amount of air in the rectosigmoid colon, but the colon appears largely decompressed. No acute osseous abnormality. IMPRESSION: 1. Stable to slightly worsened small bowel ileus versus partial obstruction. Electronically Signed   By: Titus Dubin M.D.   On: 03/27/2019 12:51      Kalman Drape , [facility] Surgery 03/29/2019, 8:37 AM  Pager: [phone] Mon-Wed, Friday 7:00am-4:30pm Thurs 7am-11:30am  Consults: [phone]

## 2019-03-30 LAB — COMPREHENSIVE METABOLIC PANEL
ALT: 60 U/L — ABNORMAL HIGH (ref 0–44)
AST: 108 U/L — ABNORMAL HIGH (ref 15–41)
Albumin: 2.1 g/dL — ABNORMAL LOW (ref 3.5–5.0)
Alkaline Phosphatase: 130 U/L — ABNORMAL HIGH (ref 38–126)
Anion gap: 3 — ABNORMAL LOW (ref 5–15)
BUN: 14 mg/dL (ref 8–23)
CO2: 27 mmol/L (ref 22–32)
Calcium: 8.1 mg/dL — ABNORMAL LOW (ref 8.9–10.3)
Chloride: 105 mmol/L (ref 98–111)
Creatinine, Ser: 0.35 mg/dL — ABNORMAL LOW (ref 0.44–1.00)
GFR calc Af Amer: >60 mL/min (ref >60)
GFR calc non Af Amer: >60 mL/min (ref >60)
Glucose, Bld: 125 mg/dL — ABNORMAL HIGH (ref 70–99)
Potassium: 4.1 mmol/L (ref 3.5–5.1)
Sodium: 135 mmol/L (ref 135–145)
Total Bilirubin: 0.3 mg/dL (ref 0.3–1.2)
Total Protein: 5.6 g/dL — ABNORMAL LOW (ref 6.5–8.1)

## 2019-03-30 LAB — GLUCOSE, CAPILLARY
Glucose-Capillary: 136 mg/dL — ABNORMAL HIGH (ref 70–99)
Glucose-Capillary: 138 mg/dL — ABNORMAL HIGH (ref 70–99)
Glucose-Capillary: 123 mg/dL — ABNORMAL HIGH (ref 70–99)
Glucose-Capillary: 109 mg/dL — ABNORMAL HIGH (ref 70–99)

## 2019-03-30 LAB — MAGNESIUM: Magnesium: 2.3 mg/dL (ref 1.7–2.4)

## 2019-03-30 LAB — PHOSPHORUS: Phosphorus: 3.6 mg/dL (ref 2.5–4.6)

## 2019-03-30 MED ORDER — INSULIN ASPART 100 UNIT/ML ~~LOC~~ SOLN
0.0000 [IU] | Freq: Three times a day (TID) | SUBCUTANEOUS | Status: AC
  Administered 2019-03-30: 13:00:00 2 [IU] via SUBCUTANEOUS

## 2019-03-30 MED ORDER — TRAVASOL 10 % IV SOLN
INTRAVENOUS | Status: DC
  Administered 2019-03-30: 17:00:00 via INTRAVENOUS
  Filled 2019-03-30: qty 756

## 2019-03-30 MED ORDER — TRAMADOL HCL 50 MG PO TABS
50.0000 mg | ORAL_TABLET | Freq: Four times a day (QID) | ORAL | Status: DC | PRN
  Administered 2019-03-30 – 2019-04-02 (×9): 50 mg via ORAL
  Filled 2019-03-30 (×9): qty 1

## 2019-03-30 NOTE — Plan of Care (Signed)
Pt resting in bed. Up in chair 6 hrs today and walked in hall twice.

## 2019-03-30 NOTE — TOC Initial Note (Signed)
Transition of Care [facility]) - Initial/Assessment Note    Patient Details  Name: Theresa Arnold MRN: [id number] Date of Birth: 09-13-1931  Transition of Care [facility]) CM/SW Contact:    Lia Hopping, [place] Phone Number: 03/30/2019, 1:59 PM  Clinical Narrative:   Patient admitted with Sigmoid Stricture. Patient underwent surgical intervention- has a new colostomy. Patient progressing slowly with therapy and instruction with colostomy. CSW met with the patient at bedside to discuss her disposition. Patient reports after talking with her spouse she is agreeable to SNF placement. Patient reports her spouse is has medical issues and cannot assist with much care at home. Patient son  does not live in the area. Patient was fairly independent at home prior to surgery.  CSW explain the SNF process and will later follow up with bed offers so that she can discuss options with spouse.  Patient currently on TPN-per physician she will not discharge on TPN.  FL2 completed.    Expected Discharge Plan: Skilled Nursing Facility Barriers to Discharge: (COVID-19 test)   Patient Goals and CMS Choice   CMS Medicare.gov Compare Post Acute Care list provided to:: Patient Choice offered to / list presented to : Patient  Expected Discharge Plan and Services Expected Discharge Plan: [place] In-house Referral: Clinical Social Work Discharge Planning Services: CM Consult   Living arrangements for the past 2 months: [place] Expected Discharge Date: (unknown)                                    Prior Living Arrangements/Services Living arrangements for the past 2 months: Single Family Home Lives with:: Spouse Patient language and need for interpreter reviewed:: No Do you feel safe going back to the place where you live?: Yes      Need for Family Participation in Patient Care: Yes (Comment) Care giver support system in place?: Yes (comment) Current home services: Home OT,  Home PT Criminal Activity/Legal Involvement Pertinent to Current Situation/Hospitalization: No - Comment as needed  Activities of Daily Living Home Assistive Devices/Equipment: Eyeglasses ADL Screening (condition at time of admission) Patient's cognitive ability adequate to safely complete daily activities?: Yes Is the patient deaf or have difficulty hearing?: No Does the patient have difficulty seeing, even when wearing glasses/contacts?: No Does the patient have difficulty concentrating, remembering, or making decisions?: No Patient able to express need for assistance with ADLs?: Yes Does the patient have difficulty dressing or bathing?: No Independently performs ADLs?: No Communication: Independent Dressing (OT): Independent Grooming: Independent Feeding: Independent Bathing: Independent Toileting: Needs assistance Is this a change from baseline?: Change from baseline, expected to last >3days In/Out Bed: Needs assistance Is this a change from baseline?: Change from baseline, expected to last >3 days Walks in Home: Needs assistance Is this a change from baseline?: Change from baseline, expected to last >3 days Does the patient have difficulty walking or climbing stairs?: Yes(secondary to weakness) Weakness of Legs: Both Weakness of Arms/Hands: None  Permission Sought/Granted Permission sought to share information with : Case Manager, Family Supports Permission granted to share information with : Yes, Verbal Permission Granted  Share Information with NAME: Amarrah Meinhart  Permission granted to share info w AGENCY: [place] granted to share info w Relationship: Spouse  Permission granted to share info w Contact Information: [phone]  [phone]  Emotional Assessment Appearance:: Appears stated age Attitude/Demeanor/Rapport: Engaged Affect (typically observed): Accepting Orientation: :  Oriented to Self, Oriented to  Time, Oriented to Situation, Oriented to  Place Alcohol / Substance Use: Not Applicable Psych Involvement: No (comment)  Admission diagnosis:  Abdominal Pain; Covid Symptoms Patient Active Problem List   Diagnosis Date Noted  . Hypoxia 03/24/2019  . Hiccups 03/23/2019  . Colonic partial obstruction from sigmoid stricture 03/18/2019  . Polyp of sigmoid colon 03/18/2019  . Oral thrush 03/18/2019  . Abnormal CT scan, gastrointestinal tract   . Abnormal CT scan, sigmoid colon   . Sigmoid stricture ([place])   . Diverticulitis of sigmoid colon 03/15/2019  . LLQ pain 11/02/2018  . Abdominal pain 09/21/2018  . Venous (peripheral) insufficiency 01/12/2014  . Sprain of ankle, unspecified site 06/28/2013  . Preventative health care 11/10/2011  . Cervical spondylosis 11/10/2011  . OA (osteoarthritis) of knee 11/10/2011  . COPD (chronic obstructive pulmonary disease) ([place]) 11/10/2011  . Dizziness 11/10/2011  . Benign neoplasm of colon 11/24/2010  . Diverticulosis of colon (without mention of hemorrhage) 11/24/2010  . Encounter for long-term (current) use of other medications 10/07/2010  . HLD (hyperlipidemia) 09/03/2010  . FATIGUE 09/03/2010  . SMOKER 09/10/2009   PCP:  Biagio Borg, MD Pharmacy:   [id number] [place] [place]Phone: [phone]: [phone]    Social Determinants of Health (SDOH) Interventions    Readmission Risk Interventions No flowsheet data found.

## 2019-03-30 NOTE — Progress Notes (Signed)
PHARMACY - ADULT TOTAL PARENTERAL NUTRITION CONSULT NOTE   Pharmacy Consult for TPN Indication: ileus, malnutrition- intolerance to enteral feeding  Patient Measurements: Height: '5\' 2"'$  (157.5 cm) Weight: 102 lb (46.3 kg) IBW/kg (Calculated) : 50.1 TPN AdjBW (KG): 47 Body mass index is 18.66 kg/m. Usual Weight:   Insulin Requirements: on moderate sSSI q6hr (2 units in 24 hrs), with 10 units insulin in TPN bag  Current Nutrition: TPN - 9/15: started on Boost  1 container TID  IVF: none  Central access: PICC  TPN start date: 03/21/19  ASSESSMENT                                                                                                          HPI: Patient is an 85 y.o F with hx recurrent diverticulitis presented to the ED on 03/15/19 with c/o abd pain.  Abd x-ray on 9/2 showed findings with suspicion for obstruction at distal colon or ileus. CCS recom possible colectomy this week (9/8-9/11).  Pharmacy is consulted to start TPN on 9/8 for ileus and malnutrition.  Significant events:  9/3: clear liquids 9/4: changed to full liquids 9/9: Laparoscopy and mobilization of the splenic flexure; laparotomy and sigmoid colectomy with end colostomy 9/10: clear liquid diet 9/11: TPN to goal rate 9/12: NPO, reduced K, Mag & Phos in TPN 9/13: resume CL diet, reduced Phos further, added insulin 9/14: started on Boost 9/17: adv to regular diet  Today:   Glucose (goal <150): wnl  Electrolytes: Na improved to 135 with Na content increased in TPN, K 4.1, Mag 2.3, Phos 3.6, CorrCa 9.62; CL and CO2 wnl  Renal: scr low  LFTs: AST elevated on 9/14 and 108 on 9/17; ALT increased to 60; Alk phos elevated at 130, Tbili wnl --> monitor LFTs closely  TGs: 91 (9/9), 65 (9/14)  Prealbumin: 6.5 (9/5), 5.6 (9/9), 8.1 (9/14)  NUTRITIONAL GOALS                                                                                             RD recs (9/15):  Kcal:  1500-1700 Protein:  70-80g Fluid:   1.7L/day  Custom TPN at goal rate of 70 ml/hr provides: -  76 g/day protein ( 45  g/L) -  44 g/day Lipid  (26 g/L) -  269  g/day Dextrose (16 %) -  1654 Kcal/day  PLAN  At 1800 today:  Continue custom TPN at goal rate of 70 ml/hr -  Provides 76 g of protein, 269 g of dextrose, and 44 g of lipids which provides 1654 kCals per day, meeting 100 % of goal kcal and goal AA  Electrolytes in TPN: Standard except Na at 150 mEq/L; Cl:Ac ratio 1:1  TPN to contain standard multivitamins and trace elements.  Change to moderate SSI q8h, continue 10 units insulin per bag  TPN lab panels on Mondays & Thursdays.  CMET on 9/18  F/u daily.  Theresa Arnold P 03/30/2019,7:30 AM

## 2019-03-30 NOTE — Progress Notes (Signed)
[facility]/Trauma Progress Note  8 Days Post-Op   Assessment/Plan Principal Problem: Sigmoid stricture (HCC) Active Problems: COPD (chronic obstructive pulmonary disease) (HCC) Diverticulitis of sigmoid colon Abnormal CT scan, gastrointestinal tract Colonic partial obstruction from sigmoid stricture Polyp of sigmoid colon Oral thrush  Partial colonic obstruction from sigmoid stricture presumed due to chronic recurrent diverticulitis -S/P laparoscopic and mobilization of the splenic flexure, laparotomy and sigmoid colectomy Jeanette Caprice procedure) with end colostomy, Dr. Hassell Done, 03/22/2019 -Pathology benign showed diverticulitis Postop ileus -X-ray 9/14 showed SBO versus ileus -appears to be resolving Severe malnutrition -Prealbumin8.1on09/14 -TPN until tolerating a regular diet  FEN:FLD,TPN VTE: SCD's, lovenox [id number] 09/02-09/10, Diflucan 09/05-09/12,WBC10.4 9/15, afebrile Foley:Removed POD 1 Follow up:Dr. Hassell Done  DISPO:Pain control, mobilize, IS, PT,  Advance to regular diet today to see if patient tolerates. continue TPN until patient is tolerating a regular diet.   LOS: 15 days    Subjective: CC: Nausea  Patient states not much pain today.  Mild nausea without vomiting.  She ate ice cream and cream potato soup yesterday.  She states she is feeling better today.  She would prefer tramadol over other pain medicines.  Objective: Vital signs in last 24 hours: Temp:  [98.2 F (36.8 C)-99.2 F (37.3 C)] 98.6 F (37 C) (09/17 0424) Pulse Rate:  [81-90] 90 (09/17 0424) Resp:  [16-17] 17 (09/17 0424) BP: (113-151)/(56-102) 151/102 (09/17 0424) SpO2:  [95 %-99 %] 95 % (09/17 0817) Last BM Date: 03/29/19  Intake/Output from previous day: 09/16 0701 - 09/17 0700 In: 2383.6 [P.O.:720; I.V.:1458.1; IV Piggyback:205.5] Out: 2600 [Urine:2350; Stool:250] Intake/Output this shift: No intake/output data recorded.  PE: Gen:  Alert, NAD, pleasant, cooperative Pulm: Rate and effort normal Abd: Soft,nodistention,+bowel sounds, midline with glueintact and iswithout signs of infection, LLQ colostomy, stoma is pink,largeamount of liquid dark greenstool in bag.  Gas also in bag. No TTP. no peritonitis Skin: no rashes noted, warm and dry   Anti-infectives: Anti-infectives (From admission, onward)   Start     Dose/Rate Route Frequency Ordered Stop   03/22/19 2200  piperacillin-tazobactam (ZOSYN) IVPB 3.375 g     3.375 g 12.5 mL/hr over 240 Minutes Intravenous Every 8 hours 03/22/19 1725 03/23/19 0224   03/22/19 1115  cefoTEtan (CEFOTAN) 2 g in sodium chloride 0.9 % 100 mL IVPB     2 g 200 mL/hr over 30 Minutes Intravenous On call to O.R. 03/21/19 1829 03/22/19 1320   03/18/19 1900  fluconazole (DIFLUCAN) IVPB 100 mg     100 mg 50 mL/hr over 60 Minutes Intravenous Every 24 hours 03/18/19 1753 03/24/19 1918   03/16/19 0600  piperacillin-tazobactam (ZOSYN) IVPB 3.375 g  Status:  Discontinued     3.375 g 12.5 mL/hr over 240 Minutes Intravenous Every 8 hours 03/15/19 2316 03/22/19 1725   03/15/19 2315  piperacillin-tazobactam (ZOSYN) IVPB 3.375 g     3.375 g 100 mL/hr over 30 Minutes Intravenous NOW 03/15/19 2314 03/16/19 0038      Lab Results:  Recent Labs    03/28/19 0340  WBC 10.4  HGB 8.9*  HCT 27.9*  PLT 198   BMET Recent Labs    03/29/19 0434 03/30/19 0356  NA 133* 135  K 4.3 4.1  CL 104 105  CO2 24 27  GLUCOSE 108* 125*  BUN 18 14  CREATININE 0.33* 0.35*  CALCIUM 8.3* 8.1*   PT/INR No results for input(s): LABPROT, INR in the last 72 hours. CMP     Component Value Date/Time   NA 135  03/30/2019 0356   K 4.1 03/30/2019 0356   CL 105 03/30/2019 0356   CO2 27 03/30/2019 0356   GLUCOSE 125 (H) 03/30/2019 0356   BUN 14 03/30/2019 0356   CREATININE 0.35 (L) 03/30/2019 0356   CALCIUM 8.1 (L) 03/30/2019 0356   PROT 5.6 (L) 03/30/2019 0356   ALBUMIN 2.1 (L) 03/30/2019 0356    AST 108 (H) 03/30/2019 0356   ALT 60 (H) 03/30/2019 0356   ALKPHOS 130 (H) 03/30/2019 0356   BILITOT 0.3 03/30/2019 0356   GFRNONAA >60 03/30/2019 0356   GFRAA >60 03/30/2019 0356   Lipase     Component Value Date/Time   LIPASE 11.0 11/02/2018 1103    Studies/Results: No results found.    Kalman Drape , [facility] 03/30/2019, 9:41 AM  Pager: [phone] Mon-Wed, Friday 7:00am-4:30pm Thurs 7am-11:30am  Consults: [phone]

## 2019-03-30 NOTE — Progress Notes (Addendum)
PROGRESS NOTE  DESTENY NIEDERMAN [id number] DOB: 09-07-1931 DOA: 03/15/2019 PCP: Biagio Borg, MD   LOS: 15 days   Brief narrative: Patient is a 85 y.o.femalewith COPD, hyperlipidemia, and chronic sigmoid diverticulitis with known abscess and stricture in the distal sigmoid colon who presented to the ED for evaluation of abdominal pain. Patient follows with GI, Dr. Tarri Glenn, for known sigmoid diverticulitis and abscess. Per GI documentation, case was discussed with IR and fluid collection was not considered amenable to IR drainage. She underwent colonoscopy 01/27/2019 which was limited due to presence of stool. Distal sigmoid stricture was seen. She was reportedly referred to general surgery, [facility] Dr. Dema Severin, and has no present plans for surgical intervention.  Patient was seen in the ED on 03/11/2019 at which time a repeat CT abdomen/pelvis with contrast showed an apparent increase in proximal sigmoid colon wall thickening and unchanged 3.5 cm intramural sigmoid colonic wall abscess/collection which was present since 11/02/2018. There was question of low-grade obstruction. She was given a prescription for ciprofloxacin and metronidazole.  Patient stated that she has had continued generalized abdominal pain. She has not had oral intake due to the nausea after eating, unable to take her home medications including the recently prescribed antibiotics. She stated that she has not had any bowel movements despite use of MiraLAX for several days. She reported a 30 pound weight loss since last year. She has not had any subjective fevers. She reported good urine output without dysuria.  Assessment/Plan:  Principal Problem:   Sigmoid stricture (HCC) Active Problems:   COPD (chronic obstructive pulmonary disease) (HCC)   Diverticulitis of sigmoid colon   Abnormal CT scan, gastrointestinal tract   Colonic partial obstruction from sigmoid stricture   Polyp of sigmoid colon   Oral thrush  Hiccups   Hypoxia  Partial colonic obstruction from sigmoid stricture thought to be secondary to chronic recurrent diverticulitis. Patient underwent laparoscopic and mobilization of the splenic flexure, laparotomy and sigmoid colectomy) Hartmann procedure with end colostomy by Dr. Hassell Done on 9/9. Continue TPN until patient is tolerating diet, return of bowel function.  Surgery has seen the patient today and recommend a regular diet.  Acute metabolic encephalopathy -Now improved mental status at baseline.  COPD Stable, compensated.  Continue duo nebs.  Oral thrush Completed IV Diflucan.  Continue nystatin swish and swallow   Severe protein calorie malnutrition  Prealbumin 6.5 on 9/5, BMI 19, continue TPN until bowel function improves and oral intake is adequate.  Has been advanced to regular diet by surgery today.  She was encouraged on increasing oral intake.  Hiccups Improved, on Reglan, Thorazine as needed for hiccups  VTE Prophylaxis: Lovenox  Code Status: Full code  Family Communication: I spoke with the the patient's husband on the phone and updated him about the clinical condition of the patient and tentative plan for disposition.  Disposition Plan: Patient has been seen by physical therapy recommend home health PT on discharge but family wishes SNF.  Patient's husband states that he has not had a chance to talk to the social worker yet.  Follow surgical recommendations on discharge.  Likely in 1 to 2 days.   Consultants:  General surgery  GI  Procedures:  Acute abdominal series 03/15/2019  Laparoscopy and mobilization of splenic flexure: Laparotomy and sigmoid colectomy(Hartmann procedure)with end colostomy per Dr. Hassell Done 03/22/2019  PICC line 03/21/2019  Antibiotics:  Zosyn 09/02-09/10,  Diflucan 09/05-09/12  Anti-infectives (From admission, onward)   Start     Dose/Rate Route Frequency  Ordered Stop   03/22/19 2200  piperacillin-tazobactam (ZOSYN) IVPB 3.375  g     3.375 g 12.5 mL/hr over 240 Minutes Intravenous Every 8 hours 03/22/19 1725 03/23/19 0224   03/22/19 1115  cefoTEtan (CEFOTAN) 2 g in sodium chloride 0.9 % 100 mL IVPB     2 g 200 mL/hr over 30 Minutes Intravenous On call to O.R. 03/21/19 1829 03/22/19 1320   03/18/19 1900  fluconazole (DIFLUCAN) IVPB 100 mg     100 mg 50 mL/hr over 60 Minutes Intravenous Every 24 hours 03/18/19 1753 03/24/19 1918   03/16/19 0600  piperacillin-tazobactam (ZOSYN) IVPB 3.375 g  Status:  Discontinued     3.375 g 12.5 mL/hr over 240 Minutes Intravenous Every 8 hours 03/15/19 2316 03/22/19 1725   03/15/19 2315  piperacillin-tazobactam (ZOSYN) IVPB 3.375 g     3.375 g 100 mL/hr over 30 Minutes Intravenous NOW 03/15/19 2314 03/16/19 0038     Subjective: Patient stated that she is been trying to eat more.  No difficulty urinating.  Ambulated to the bathroom.  Has been having output through the colostomy tube.  Denies any shortness of breath, chest pain, palpitation.  Objective: Vitals:   03/30/19 0424 03/30/19 0817  BP: (!) 151/102   Pulse: 90   Resp: 17   Temp: 98.6 F (37 C)   SpO2: 97% 95%    Intake/Output Summary (Last 24 hours) at 03/30/2019 1036 Last data filed at 03/30/2019 0700 Gross per 24 hour  Intake 1465.23 ml  Output 2350 ml  Net -884.77 ml   Filed Weights   03/23/19 0500 03/24/19 0438 03/28/19 0809  Weight: 50.4 kg 49.4 kg 46.3 kg   Body mass index is 18.66 kg/m.   Physical Exam: General: Thinly built , not in obvious distress HENT: Normocephalic, pupils equally reacting to light and accommodation.  No scleral pallor or icterus noted. Oral mucosa is moist.  Chest:  Clear breath sounds.  Diminished breath sounds bilaterally. No crackles or wheezes.  CVS: S1 &S2 heard. No murmur.  Regular rate and rhythm. Abdomen: Soft, nontender, nondistended.  Colostomy bag in place with liquid stool.  Extremities: No cyanosis, clubbing or edema.  Peripheral pulses are palpable. Psych:  Alert, awake and oriented, normal mood CNS:  No cranial nerve deficits.  Power equal in all extremities.  No sensory deficits noted.  No cerebellar signs.   Skin: Warm and dry.  No rashes noted.  Data Review: I have personally reviewed the following laboratory data and studies,  CBC: Recent Labs  Lab 03/24/19 0908 03/25/19 0418 03/27/19 0430 03/28/19 0340  WBC 14.8* 12.2* 9.7 10.4  NEUTROABS  --   --  6.7  --   HGB 9.3* 9.3* 9.6* 8.9*  HCT 29.5* 29.2* 29.5* 27.9*  MCV 100.0 99.3 97.4 97.9  PLT 160 175 213 [id number]   Basic Metabolic Panel: Recent Labs  Lab 03/24/19 0340 03/25/19 0418 03/26/19 0501 03/27/19 0430 03/28/19 0340 03/29/19 0434 03/30/19 0356  NA 140 136 135 134* 133* 133* 135  K 3.9 4.5 4.4 4.3 4.4 4.3 4.1  CL 106 103 102 101 101 104 105  CO2 26 27 26 25 23 24 27   GLUCOSE 130* 169* 131* 119* 127* 108* 125*  BUN 13 15 12 14 17 18 14   CREATININE 0.51 0.48 0.30* 0.41* 0.36* 0.33* 0.35*  CALCIUM 8.1* 8.1* 8.1* 8.4* 8.3* 8.3* 8.1*  MG 2.2 2.2 2.3 2.2  --   --  2.3  PHOS 3.0 3.6 4.3 4.1  --   --  3.6   Liver Function Tests: Recent Labs  Lab 03/24/19 0340 03/25/19 0418 03/26/19 0501 03/27/19 0430 03/30/19 0356  AST 16 16 18  49* 108*  ALT 5 5 6 13  60*  ALKPHOS 32* 41 45 69 130*  BILITOT 0.2* 0.3 0.4 0.5 0.3  PROT 5.0* 5.2* 5.7* 6.0* 5.6*  ALBUMIN 2.2* 2.2* 2.2* 2.2* 2.1*   No results for input(s): LIPASE, AMYLASE in the last 168 hours. No results for input(s): AMMONIA in the last 168 hours. Cardiac Enzymes: No results for input(s): CKTOTAL, CKMB, CKMBINDEX, TROPONINI in the last 168 hours. BNP (last 3 results) No results for input(s): BNP in the last 8760 hours.  ProBNP (last 3 results) No results for input(s): PROBNP in the last 8760 hours.  CBG: Recent Labs  Lab 03/29/19 0615 03/29/19 1135 03/29/19 1730 03/30/19 0120 03/30/19 0646  GLUCAP 109* 141* 103* 136* 138*   Recent Results (from the past 240 hour(s))  Surgical pcr screen     Status:  None   Collection Time: 03/21/19 11:56 PM   Specimen: Nasal Mucosa; Nasal Swab  Result Value Ref Range Status   MRSA, PCR NEGATIVE NEGATIVE Final   Staphylococcus aureus NEGATIVE NEGATIVE Final    Comment: (NOTE) The Xpert SA Assay (FDA approved for NASAL specimens in patients 73 years of age and older), is one component of a comprehensive surveillance program. It is not intended to diagnose infection nor to guide or monitor treatment. Performed at [facility], [place] [address], [place], [place] [address]      Studies: No results found.  Scheduled Meds: . acetaminophen (TYLENOL) oral liquid 160 mg/5 mL  650 mg Oral Q6H  . docusate  50 mg Oral Daily  . enoxaparin (LOVENOX) injection  30 mg Subcutaneous Q24H  . feeding supplement  1 Container Oral TID BM  . insulin aspart  0-15 Units Subcutaneous Q6H  . lip balm  1 application Topical BID  . mometasone-formoterol  2 puff Inhalation BID  . nystatin  5 mL Oral QID  . sodium chloride flush  10-40 mL Intracatheter Q12H  . sodium chloride flush  3 mL Intravenous Q12H  . umeclidinium bromide  1 puff Inhalation Daily    Continuous Infusions: . sodium chloride 250 mL (03/27/19 1825)  . chlorproMAZINE (THORAZINE) IV 12.5 mg (03/24/19 2151)  . methocarbamol (ROBAXIN) IV 500 mg (03/30/19 0934)  . TPN ADULT (ION) 70 mL/hr at 03/29/19 1819     Flora Lipps, MD  [facility] 03/30/2019

## 2019-03-30 NOTE — NC FL2 (Signed)
[place] LEVEL OF CARE SCREENING TOOL     IDENTIFICATION  Patient Name: Theresa Arnold Birthdate: 03/22/1932 Sex: female Admission Date (Current Location): 03/15/2019  [facility] [place] and [place] Number:  Herbalist and Address:  [facility],  [place] [address], [place]      Provider Number: [id number]  Attending Physician Name and Address:  Flora Lipps, MD  Relative Name and Phone Number:  Geoffrey, Nedved [phone]  [phone]    Current Level of Care: Hospital Recommended Level of Care: [place] Prior Approval Number:    Date Approved/Denied:   PASRR Number: [id number]  Discharge Plan: SNF    Current Diagnoses: Patient Active Problem List   Diagnosis Date Noted  . Hypoxia 03/24/2019  . Hiccups 03/23/2019  . Colonic partial obstruction from sigmoid stricture 03/18/2019  . Polyp of sigmoid colon 03/18/2019  . Oral thrush 03/18/2019  . Abnormal CT scan, gastrointestinal tract   . Abnormal CT scan, sigmoid colon   . Sigmoid stricture ([place])   . Diverticulitis of sigmoid colon 03/15/2019  . LLQ pain 11/02/2018  . Abdominal pain 09/21/2018  . Venous (peripheral) insufficiency 01/12/2014  . Sprain of ankle, unspecified site 06/28/2013  . Preventative health care 11/10/2011  . Cervical spondylosis 11/10/2011  . OA (osteoarthritis) of knee 11/10/2011  . COPD (chronic obstructive pulmonary disease) ([place]) 11/10/2011  . Dizziness 11/10/2011  . Benign neoplasm of colon 11/24/2010  . Diverticulosis of colon (without mention of hemorrhage) 11/24/2010  . Encounter for long-term (current) use of other medications 10/07/2010  . HLD (hyperlipidemia) 09/03/2010  . FATIGUE 09/03/2010  . SMOKER 09/10/2009    Orientation RESPIRATION BLADDER Height & Weight     Self, Time, Situation, Place  Normal Continent Weight: 102 lb (46.3 kg) Height:  5\' 2"  (157.5 cm)  BEHAVIORAL SYMPTOMS/MOOD NEUROLOGICAL BOWEL  NUTRITION STATUS      Colostomy Diet  AMBULATORY STATUS COMMUNICATION OF NEEDS Skin   Extensive Assist Verbally Surgical wounds                       Personal Care Assistance Level of Assistance  Bathing, Feeding, Dressing Bathing Assistance: Limited assistance Feeding assistance: Independent Dressing Assistance: Limited assistance     Functional Limitations Info  Sight, Hearing, Speech Sight Info: Adequate Hearing Info: Adequate Speech Info: Adequate    SPECIAL CARE FACTORS FREQUENCY  PT (By licensed PT), OT (By licensed OT)     PT Frequency: 5x/week OT Frequency: 5x/week            Contractures Contractures Info: Not present    Additional Factors Info  Code Status, Allergies, Psychotropic, Insulin Sliding Scale Code Status Info: Fullcode Allergies Info: Allergies: No Known Allergies   Insulin Sliding Scale Info: 0-15 units every 8 hours       Current Medications (03/30/2019):  This is the current hospital active medication list Current Facility-Administered Medications  Medication Dose Route Frequency Provider Last Rate Last Dose  . 0.9 %  sodium chloride infusion  250 mL Intravenous PRN Meuth, Brooke A, PA-C 10 mL/hr at 03/27/19 1825 250 mL at 03/27/19 1825  . acetaminophen (TYLENOL) solution 650 mg  650 mg Oral Q6H Focht, Jessica L, PA   650 mg at 03/30/19 0355  . albuterol (VENTOLIN HFA) 108 (90 Base) MCG/ACT inhaler 1-2 puff  1-2 puff Inhalation Q6H PRN Meuth, Brooke A, PA-C      . alum & mag hydroxide-simeth (MAALOX/MYLANTA) 200-200-20 MG/5ML suspension 30  mL  30 mL Oral Q6H PRN Meuth, Brooke A, PA-C      . chlorproMAZINE (THORAZINE) 12.5 mg in sodium chloride 0.9 % 25 mL IVPB  12.5 mg Intravenous Q6H PRN Focht, Jessica L, PA 50 mL/hr at 03/24/19 2151 12.5 mg at 03/24/19 2151  . docusate (COLACE) 50 MG/5ML liquid 50 mg  50 mg Oral Daily Focht, Jessica L, PA   50 mg at 03/29/19 1001  . enoxaparin (LOVENOX) injection 30 mg  30 mg Subcutaneous Q24H Meuth,  Brooke A, PA-C   30 mg at 03/30/19 1259  . feeding supplement (BOOST / RESOURCE BREEZE) liquid 1 Container  1 Container Oral TID BM Rai, Vernelle Emerald, MD   1 Container at 03/29/19 2030  . guaiFENesin-dextromethorphan (ROBITUSSIN DM) 100-10 MG/5ML syrup 10 mL  10 mL Oral Q4H PRN Meuth, Brooke A, PA-C      . insulin aspart (novoLOG) injection 0-15 Units  0-15 Units Subcutaneous Q8H Lynelle Doctor, RPH   2 Units at 03/30/19 1258  . ipratropium-albuterol (DUONEB) 0.5-2.5 (3) MG/3ML nebulizer solution 3 mL  3 mL Nebulization Q2H PRN Meuth, Brooke A, PA-C      . lip balm (CARMEX) ointment 1 application  1 application Topical BID Meuth, Brooke A, PA-C   1 application at [id number]  . magic mouthwash  15 mL Oral QID PRN Meuth, Brooke A, PA-C      . methocarbamol (ROBAXIN) 500 mg in dextrose 5 % 50 mL IVPB  500 mg Intravenous Q6H Focht, Jessica L, PA 100 mL/hr at 03/30/19 0934 500 mg at 03/30/19 0934  . metoCLOPramide (REGLAN) injection 5 mg  5 mg Intravenous Q6H PRN Jani Gravel, MD      . mometasone-formoterol [facility]) 100-5 MCG/ACT inhaler 2 puff  2 puff Inhalation BID Meuth, Brooke A, PA-C   2 puff at 03/30/19 0816  . morphine 2 MG/ML injection 1 mg  1 mg Intravenous Q4H PRN Focht, Jessica L, PA      . nystatin (MYCOSTATIN) 100000 UNIT/ML suspension 500,000 Units  5 mL Oral QID Meuth, Brooke A, PA-C   500,000 Units at 03/30/19 0916  . ondansetron (ZOFRAN) tablet 4 mg  4 mg Oral Q6H PRN Meuth, Brooke A, PA-C       Or  . ondansetron (ZOFRAN) injection 4 mg  4 mg Intravenous Q6H PRN Meuth, Brooke A, PA-C   4 mg at 03/29/19 1020  . oxyCODONE (ROXICODONE) 5 MG/5ML solution 5 mg  5 mg Oral Q4H PRN Focht, Jessica L, PA   5 mg at 03/30/19 0417  . phenol (CHLORASEPTIC) mouth spray 1-2 spray  1-2 spray Mouth/Throat PRN Meuth, Brooke A, PA-C      . polyethylene glycol (MIRALAX / GLYCOLAX) packet 17 g  17 g Oral Daily PRN Focht, Jessica L, PA      . simethicone (MYLICON) 40 [id number] suspension 40 mg  40 mg Oral QID PRN  Meuth, Brooke A, PA-C      . sodium chloride flush (NS) 0.9 % injection 10-40 mL  10-40 mL Intracatheter Q12H Meuth, Brooke A, PA-C   20 mL at 03/29/19 2230  . sodium chloride flush (NS) 0.9 % injection 10-40 mL  10-40 mL Intracatheter PRN Meuth, Brooke A, PA-C   10 mL at 03/25/19 0434  . sodium chloride flush (NS) 0.9 % injection 3 mL  3 mL Intravenous Q12H Meuth, Brooke A, PA-C   3 mL at 03/29/19 2230  . sodium chloride flush (NS) 0.9 % injection 3 mL  3 mL Intravenous PRN Meuth, Brooke A, PA-C      . TPN ADULT (ION)   Intravenous Continuous TPN Lynelle Doctor, RPH 70 mL/hr at 03/29/19 1819    . TPN ADULT (ION)   Intravenous Continuous TPN Pham, Anh P, RPH      . traMADol (ULTRAM) tablet 50 mg  50 mg Oral Q6H PRN Focht, Jessica L, PA   50 mg at 03/30/19 1304  . umeclidinium bromide (INCRUSE ELLIPTA) 62.5 MCG/INH 1 puff  1 puff Inhalation Daily Meuth, Brooke A, PA-C   1 puff at 03/29/19 0935     Discharge Medications: Please see discharge summary for a list of discharge medications.  Relevant Imaging Results:  Relevant Lab Results:   Additional Information [id number]  Lia Hopping, LCSW

## 2019-03-31 LAB — GLUCOSE, CAPILLARY
Glucose-Capillary: 112 mg/dL — ABNORMAL HIGH (ref 70–99)
Glucose-Capillary: 102 mg/dL — ABNORMAL HIGH (ref 70–99)
Glucose-Capillary: 82 mg/dL (ref 70–99)
Glucose-Capillary: 101 mg/dL — ABNORMAL HIGH (ref 70–99)

## 2019-03-31 LAB — COMPREHENSIVE METABOLIC PANEL
ALT: 48 U/L — ABNORMAL HIGH (ref 0–44)
AST: 65 U/L — ABNORMAL HIGH (ref 15–41)
Albumin: 2.1 g/dL — ABNORMAL LOW (ref 3.5–5.0)
Alkaline Phosphatase: 122 U/L (ref 38–126)
Anion gap: 8 (ref 5–15)
BUN: 20 mg/dL (ref 8–23)
CO2: 24 mmol/L (ref 22–32)
Calcium: 8.3 mg/dL — ABNORMAL LOW (ref 8.9–10.3)
Chloride: 105 mmol/L (ref 98–111)
Creatinine, Ser: 0.35 mg/dL — ABNORMAL LOW (ref 0.44–1.00)
GFR calc Af Amer: >60 mL/min (ref >60)
GFR calc non Af Amer: >60 mL/min (ref >60)
Glucose, Bld: 121 mg/dL — ABNORMAL HIGH (ref 70–99)
Potassium: 4.2 mmol/L (ref 3.5–5.1)
Sodium: 137 mmol/L (ref 135–145)
Total Bilirubin: 0.3 mg/dL (ref 0.3–1.2)
Total Protein: 5.8 g/dL — ABNORMAL LOW (ref 6.5–8.1)

## 2019-03-31 NOTE — Discharge Instructions (Signed)
[facility]      [facility] °[phone] ° °OPEN ABDOMINAL SURGERY: POST OP INSTRUCTIONS ° °Always review your discharge instruction sheet given to you by the facility where your surgery was performed. ° °IF YOU HAVE DISABILITY OR FAMILY LEAVE FORMS, YOU MUST BRING THEM TO THE OFFICE FOR PROCESSING.  PLEASE DO NOT GIVE THEM TO YOUR DOCTOR. ° °1. A prescription for pain medication may be given to you upon discharge.  Take your pain medication as prescribed, if needed.  If narcotic pain medicine is not needed, then you may take acetaminophen (Tylenol) or ibuprofen (Advil) as needed. °2. Take your usually prescribed medications unless otherwise directed. °3. If you need a refill on your pain medication, please contact your pharmacy. They will contact our office to request authorization.  Prescriptions will not be filled after 5pm or on week-ends. °4. You should follow a light diet the first few days after arrival home, such as soup and crackers, pudding, etc.unless your doctor has advised otherwise. A high-fiber, low fat diet can be resumed as tolerated.   Be sure to include lots of fluids daily. Most patients will experience some swelling and bruising on the chest and neck area.  Ice packs will help.  Swelling and bruising can take several days to resolve °5. Most patients will experience some swelling and bruising in the area of the incision. Ice pack will help. Swelling and bruising can take several days to resolve..  °6. It is common to experience some constipation if taking pain medication after surgery.  Increasing fluid intake and taking a stool softener will usually help or prevent this problem from occurring.  A mild laxative (Milk of Magnesia or Miralax) should be taken according to package directions if there are no bowel movements after 48 hours. °7.  You may have steri-strips (small skin tapes) in place directly over the incision.  These strips should be left on the skin for 7-10 days.  If your  surgeon used skin glue on the incision, you may shower in 24 hours.  The glue will flake off over the next 2-3 weeks.  Any sutures or staples will be removed at the office during your follow-up visit. You may find that a light gauze bandage over your incision may keep your staples from being rubbed or pulled. You may shower and replace the bandage daily. °8. ACTIVITIES:  You may resume regular (light) daily activities beginning the next day--such as daily self-care, walking, climbing stairs--gradually increasing activities as tolerated.  You may have sexual intercourse when it is comfortable.  Refrain from any heavy lifting or straining until approved by your doctor. °a. You may drive when you no longer are taking prescription pain medication, you can comfortably wear a seatbelt, and you can safely maneuver your car and apply brakes °b. Return to Work: ___________________________________ °9. You should see your doctor in the office for a follow-up appointment approximately two weeks after your surgery.  Make sure that you call for this appointment within a day or two after you arrive home to insure a convenient appointment time. °OTHER INSTRUCTIONS:  °_____________________________________________________________ °_____________________________________________________________ ° °WHEN TO CALL YOUR DOCTOR: °1. Fever over 101.0 °2. Inability to urinate °3. Nausea and/or vomiting °4. Extreme swelling or bruising °5. Continued bleeding from incision. °6. Increased pain, redness, or drainage from the incision. °7. Difficulty swallowing or breathing °8. Muscle cramping or spasms. °9. Numbness or tingling in hands or feet or around lips. ° °The clinic staff is available to   answer your questions during regular business hours.  Please dont hesitate to call and ask to speak to one of the nurses if you have concerns.  For further questions, please visit [email or web address]     Colostomy Home Guide,  Adult  Colostomy surgery is done to create an opening in the front of the abdomen for stool (feces) to leave the body through an ostomy (stoma). Part of the large intestine is attached to the stoma. A bag, also called a pouch, is fitted over the stoma. Stool and gas will collect in the bag. After surgery, you will need to empty and change your colostomy bag as needed. You will also need to care for your stoma. How to care for the stoma Your stoma should look pink, red, and moist, like the inside of your cheek. Soon after surgery, the stoma may be swollen, but this swelling will go away within 6 weeks. To care for the stoma:  Keep the skin around the stoma clean and dry.  Use a clean, soft washcloth to gently wash the stoma and the skin around it. Clean using a circular motion, and wipe away from the stoma opening, not toward it. ? Use warm water and only use cleansers recommended by your health care provider. ? Rinse the stoma area with plain water. ? Dry the area around the stoma well.  Use stoma powder or ointment on your skin only as told by your health care provider. Do not use any other powders, gels, wipes, or creams on the skin around the stoma.  Check the stoma area every day for signs of infection. Check for: ? New or worsening redness, swelling, or pain. ? New or increased fluid or blood. ? Pus or warmth.  Measure the stoma opening regularly and record the size. Watch for changes. (It is normal for the stoma to get smaller as swelling goes away.) Share this information with your health care provider. How to empty the colostomy bag  Empty your bag at bedtime and whenever it is one-third to one-half full. Do not let the bag get more than half-full with stool or gas. The bag could leak if it gets too full. Some colostomy bags have a built-in gas release valve that releases gas often throughout the day. Follow these basic steps: 1. Wash your hands with soap and water. 2. Sit far back  on the toilet seat. 3. Put several pieces of toilet paper into the toilet water. This will prevent splashing as you empty stool into the toilet. 4. Remove the clip or the hook-and-loop fastener from the tail end of the bag. 5. Unroll the tail, then empty the stool into the toilet. 6. Clean the tail with toilet paper or a moist towelette. 7. Reroll the tail, and close it with the clip or the hook-and-loop fastener. 8. Wash your hands again. How to change the colostomy bag Change your bag every 3-4 days or as often as told by your health care provider. Also change the bag if it is leaking or separating from the skin, or if your skin around the stoma looks or feels irritated. Irritated skin may be a sign that the bag is leaking. Always have colostomy supplies with you, and follow these basic steps: 1. Wash your hands with soap and water. Have paper towels or tissues nearby to clean any discharge. 2. Remove the old bag and skin barrier. Use your fingers or a warm cloth to gently push the skin away from the barrier. 3.  Clean the stoma area with water or with mild soap and water, as directed. Use water to rinse away any soap. 4. Dry the skin. You may use the cool setting on a hair dryer to do this. 5. Use a tracing pattern (template) to cut the skin barrier to the size needed. 6. If you are using a two-piece bag, attach the bag and the skin barrier to each other. Add the barrier ring, if you use one. 7. If directed, apply stoma powder or skin barrier gel to the skin. 8. Warm the skin barrier with your hands, or blow with a hair dryer for 5-10 seconds. 9. Remove the paper from the adhesive strip of the skin barrier. 10. Press the adhesive strip onto the skin around the stoma. 11. Gently rub the skin barrier onto the skin. This creates heat that helps the barrier to stick. 12. Apply stoma tape to the edges of the skin barrier, if desired. 59. Wash your hands again. General recommendations  Avoid  wearing tight clothes or having anything press directly on your stoma or bag. Change your clothing whenever it is soiled or damp.  You may shower or bathe with the bag on or off. Do not use harsh or oily soaps or lotions. Dry the skin and bag after bathing.  Store all supplies in a cool, dry place. Do not leave supplies in extreme heat because some parts can melt or not stick as well.  Whenever you leave home, take extra clothing and an extra skin barrier and bag with you.  If your bag gets wet, you can dry it with a hair dryer on the cool setting.  To prevent odor, you may put drops of ostomy deodorizer in the bag.  If recommended by your health care provider, put ostomy lubricant inside the bag. This helps stool to slide out of the bag more easily and completely. Contact a health care provider if:  You have new or worsening redness, swelling, or pain around your stoma.  You have new or increased fluid or blood coming from your stoma.  Your stoma feels warm to the touch.  You have pus coming from your stoma.  Your stoma extends in or out farther than normal.  You need to change your bag every day.  You have a fever. Get help right away if:  Your stool is bloody.  You have nausea or you vomit.  You have trouble breathing. Summary  Measure your stoma opening regularly and record the size. Watch for changes.  Empty your bag at bedtime and whenever it is one-third to one-half full. Do not let the bag get more than half-full with stool or gas.  Change your bag every 3-4 days or as often as told by your health care provider.  Whenever you leave home, take extra clothing and an extra skin barrier and bag with you. This information is not intended to replace advice given to you by your health care provider. Make sure you discuss any questions you have with your health care provider. Document Released: 07/02/2003 Document Revised: 10/19/2018 Document Reviewed:  12/23/2016 Elsevier Patient Education  2020 [employer].

## 2019-03-31 NOTE — Progress Notes (Signed)
[facility]/Trauma Progress Note  9 Days Post-Op   Assessment/Plan COPD (chronic obstructive pulmonary disease) (HCC) Diverticulitis of sigmoid colon Abnormal CT scan, gastrointestinal tract Colonic partial obstruction from sigmoid stricture Polyp of sigmoid colon Oral thrush  Partial colonic obstruction from sigmoid stricture presumed due to chronic recurrent diverticulitis -S/P laparoscopic and mobilization of the splenic flexure, laparotomy and sigmoid colectomy Jeanette Caprice procedure) with end colostomy, Dr. Hassell Done, 03/22/2019 -Pathology benign showed diverticulitis Postop ileus -X-ray 9/14 showed SBO versus ileus - Improved Severe malnutrition -Prealbumin8.1on09/14 -okay to wean or stop TPN  [id number] diet VTE: SCD's, lovenox [id number] 09/02-09/10, Diflucan 09/05-09/12,WBC10.49/15, afebrile Foley:Removed POD 1 Follow up:Dr. Hassell Done  DISPO:Patient is tolerating a regular diet.  Can wean or stop TPN.  Patient is okay for discharge from a surgical standpoint   LOS: 16 days    Subjective: CC: No complaints  Patient denies abdominal pain, nausea, vomiting or fever.  She states she is feeling well today.  She is eating more.  She would like to speak to social worker about rehab.   Objective: Vital signs in last 24 hours: Temp:  [98.2 F (36.8 C)-98.8 F (37.1 C)] 98.3 F (36.8 C) (09/18 0448) Pulse Rate:  [82-91] 82 (09/18 0448) Resp:  [16-18] 18 (09/18 0448) BP: (102-123)/(63-84) 102/66 (09/18 0448) SpO2:  [94 %-98 %] 95 % (09/18 0824) Weight:  [49.3 kg] 49.3 kg (09/18 0448) Last BM Date: 03/30/19  Intake/Output from previous day: 09/17 0701 - 09/18 0700 In: 2564 [P.O.:210; I.V.:2104; IV Piggyback:250] Out: [id number] [Urine:1150; Stool:240] Intake/Output this shift: No intake/output data recorded.  PE: Gen: Alert, NAD, pleasant, cooperative Pulm: Rate and effort normal Abd: Soft,nodistention,+bowel sounds, midline  with glueintact and iswithout signs of infection, LLQ colostomy, stoma is pink,largeamount of liquiddarkgreenstool in bag.  Gas also in bag. No TTP. no peritonitis Skin: no rashes noted, warm and dry   Anti-infectives: Anti-infectives (From admission, onward)   Start     Dose/Rate Route Frequency Ordered Stop   03/22/19 2200  piperacillin-tazobactam (ZOSYN) IVPB 3.375 g     3.375 g 12.5 mL/hr over 240 Minutes Intravenous Every 8 hours 03/22/19 1725 03/23/19 0224   03/22/19 1115  cefoTEtan (CEFOTAN) 2 g in sodium chloride 0.9 % 100 mL IVPB     2 g 200 mL/hr over 30 Minutes Intravenous On call to O.R. 03/21/19 1829 03/22/19 1320   03/18/19 1900  fluconazole (DIFLUCAN) IVPB 100 mg     100 mg 50 mL/hr over 60 Minutes Intravenous Every 24 hours 03/18/19 1753 03/24/19 1918   03/16/19 0600  piperacillin-tazobactam (ZOSYN) IVPB 3.375 g  Status:  Discontinued     3.375 g 12.5 mL/hr over 240 Minutes Intravenous Every 8 hours 03/15/19 2316 03/22/19 1725   03/15/19 2315  piperacillin-tazobactam (ZOSYN) IVPB 3.375 g     3.375 g 100 mL/hr over 30 Minutes Intravenous NOW 03/15/19 2314 03/16/19 0038      Lab Results:  No results for input(s): WBC, HGB, HCT, PLT in the last 72 hours. BMET Recent Labs    03/30/19 0356 03/31/19 0619  NA 135 137  K 4.1 4.2  CL 105 105  CO2 27 24  GLUCOSE 125* 121*  BUN 14 20  CREATININE 0.35* 0.35*  CALCIUM 8.1* 8.3*   PT/INR No results for input(s): LABPROT, INR in the last 72 hours. CMP     Component Value Date/Time   NA 137 03/31/2019 0619   K 4.2 03/31/2019 0619   CL 105 03/31/2019 0619   CO2 24  03/31/2019 0619   GLUCOSE 121 (H) 03/31/2019 0619   BUN 20 03/31/2019 0619   CREATININE 0.35 (L) 03/31/2019 0619   CALCIUM 8.3 (L) 03/31/2019 0619   PROT 5.8 (L) 03/31/2019 0619   ALBUMIN 2.1 (L) 03/31/2019 0619   AST 65 (H) 03/31/2019 0619   ALT 48 (H) 03/31/2019 0619   ALKPHOS 122 03/31/2019 0619   BILITOT 0.3 03/31/2019 0619   GFRNONAA  >60 03/31/2019 0619   GFRAA >60 03/31/2019 0619   Lipase     Component Value Date/Time   LIPASE 11.0 11/02/2018 1103    Studies/Results: No results found.    Kalman Drape , [facility] 03/31/2019, 8:53 AM  Pager: [phone] Mon-Wed, Friday 7:00am-4:30pm Thurs 7am-11:30am  Consults: [phone]

## 2019-03-31 NOTE — Consult Note (Addendum)
[place] Nurse ostomy follow up Stoma type/location: LLQ end colostomy.  Patient states she has not done much with her ostomy.  I offered to change her pouch and let her assist and she once again states she doesn't feel like it. I have informed her that the learning will continue at rehab.   Stomal assessment/size: 1 1/2" pink and patent  Pouch is half full of liquid green stool and empty this and she assists. She cleanses the end of the pouch and rolls closed.  Peristomal assessment: not assessed. POuch was applied 03/30/19 Treatment options for stomal/peristomal skin: barrier ring and 2 piece pouch Output  Liquid green stool.  Emptied with patient assist.   Ostomy pouching: 2pc.  Education provided: emptying, open and closing pouch.  Refuses to do pouch change for learning.  Stating she doesn't feel like it.  Enrolled patient in [place] Start Discharge program: Yes [place] team following.  Domenic Moras MSN, RN, FNP-BC CWON Wound, Ostomy, Continence Nurse Pager [phone]

## 2019-03-31 NOTE — Progress Notes (Signed)
Physical Therapy Treatment Patient Details Name: Theresa Arnold MRN: [id number] DOB: August 19, 1931 Today's Date: 03/31/2019    History of Present Illness 85yo female who presented to the ED with abdominal pain. Admitted for chronic/recurrent sigmoid diverticulitis with known abscess and distal sigmoid stricture. 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 Pt s/p laparoscopic and mobilization of the splenic flexure, laparotomy and sigmoid colectomy (Hartmann procedure) with end colostomyscess on 03/22/19    PT Comments    Assisted OOB to amb a greater distance.  General Gait Details: Cues for safety. Assist to steady pt and maneuver RW. Slow gait speed. Pt c/o increased pain with increased  Distance.    Follow Up Recommendations  SNF     Equipment Recommendations  Rolling walker with 5" wheels    Recommendations for Other Services       Precautions / Restrictions Precautions Precautions: Fall Precaution Comments: abd sg; L colostomy    Mobility  Bed Mobility Overal bed mobility: Needs Assistance Bed Mobility: Supine to Sit;Sit to Supine     Supine to sit: Supervision;Min guard Sit to supine: Min assist   General bed mobility comments: light assist upper body supine to sit then assist b LE up onto bed  Transfers Overall transfer level: Needs assistance Equipment used: Rolling walker (2 wheeled) Transfers: Sit to/from Stand Sit to Stand: Min guard;Min assist Stand pivot transfers: Min assist       General transfer comment: VCs safety, hand placement. Assist to steady.  Ambulation/Gait Ambulation/Gait assistance: Min assist Gait Distance (Feet): 85 Feet Assistive device: Rolling walker (2 wheeled) Gait Pattern/deviations: Decreased step length - right;Decreased step length - left;Decreased stride length Gait  velocity: decreased   General Gait Details: Cues for safety. Assist to steady pt and maneuver RW. Slow gait speed. Pt c/o increased pain with increased  distance   Stairs             Wheelchair Mobility    Modified Rankin (Stroke Patients Only)       Balance                                            Cognition Arousal/Alertness: Awake/alert Behavior During Therapy: WFL for tasks assessed/performed Overall Cognitive Status: Within Functional Limits for tasks assessed                                        Exercises      General Comments        Pertinent Vitals/Pain Pain Assessment: 0-10 Pain Score: 8  Pain Location: abd Pain Descriptors / Indicators: Discomfort;Guarding Pain Intervention(s): Monitored during session;Repositioned;Patient requesting pain meds-RN notified    Home Living                      Prior Function            PT Goals (current goals can now be found in the care plan section) Progress towards PT goals: Progressing toward goals    Frequency    Min 3X/week      PT Plan Current plan remains appropriate    Co-evaluation  AM-PAC PT "6 Clicks" Mobility   Outcome Measure  Help needed turning from your back to your side while in a flat bed without using bedrails?: A Little Help needed moving from lying on your back to sitting on the side of a flat bed without using bedrails?: A Little Help needed moving to and from a bed to a chair (including a wheelchair)?: A Little Help needed standing up from a chair using your arms (e.g., wheelchair or bedside chair)?: A Little Help needed to walk in hospital room?: A Little Help needed climbing 3-5 steps with a railing? : A Lot 6 Click Score: 17    End of Session Equipment Utilized During Treatment: Gait belt Activity Tolerance: Patient limited by pain Patient left: in bed;with call bell/phone within reach Nurse Communication:  Mobility status;Patient requests pain meds PT Visit Diagnosis: Unsteadiness on feet (R26.81);Muscle weakness (generalized) (M62.81)     Time: [id number] PT Time Calculation (min) (ACUTE ONLY): 15 min  Charges:  $Gait Training: 8-22 mins                     Rica Koyanagi  PTA Acute  Rehabilitation Services Pager      [phone] Office      [phone]

## 2019-03-31 NOTE — Progress Notes (Signed)
PHARMACY - ADULT TOTAL PARENTERAL NUTRITION CONSULT NOTE   Pharmacy Consult for TPN Indication: ileus, malnutrition- intolerance to enteral feeding  Patient Measurements: Height: 5\' 2"  (157.5 cm) Weight: 108 lb 11 oz (49.3 kg) IBW/kg (Calculated) : 50.1 TPN AdjBW (KG): 47 Body mass index is 19.88 kg/m. Usual Weight:   Insulin Requirements: on moderate sSSI q6hr (2 units in 24 hrs), with 10 units insulin in TPN bag  Current Nutrition: TPN - 9/15: started on Boost  1 container TID  IVF: none  Central access: PICC  TPN start date: 03/21/19  ASSESSMENT                                                                                                          HPI: Patient is an 85 y.o F with hx recurrent diverticulitis presented to the ED on 03/15/19 with c/o abd pain.  Abd x-ray on 9/2 showed findings with suspicion for obstruction at distal colon or ileus. [facility] recom possible colectomy this week (9/8-9/11).  Pharmacy is consulted to start TPN on 9/8 for ileus and malnutrition.  Significant events:  9/3: clear liquids 9/4: changed to full liquids 9/9: Laparoscopy and mobilization of the splenic flexure; laparotomy and sigmoid colectomy with end colostomy 9/10: clear liquid diet 9/11: TPN to goal rate 9/12: NPO, reduced K, Mag & Phos in TPN 9/13: resume CL diet, reduced Phos further, added insulin 9/14: started on Boost 9/17: adv to regular diet  Today:   Glucose (goal <150): wnl  Electrolytes: Na improved to 137 with Na content increased in TPN, K 4.2, Mag 2.3 and Phos 3.6 on 9/17; CorrCa wnl; CL and CO2 wnl  Renal: scr low  LFTs: AST/ALT coming down and slightly elevated today, Tbili wnl --> monitor LFTs closely  TGs: 91 (9/9), 65 (9/14)  Prealbumin: 6.5 (9/5), 5.6 (9/9), 8.1 (9/14)  NUTRITIONAL GOALS                                                                                             RD recs (9/15):  Kcal:  1500-1700 Protein:  70-80g Fluid:  1.7L/day  Custom TPN  at goal rate of 70 ml/hr provides: -  76 g/day protein ( 45  g/L) -  44 g/day Lipid  (26 g/L) -  269  g/day Dextrose (16 %) -  1654 Kcal/day  PLAN                                                                                                                          -  Per discussion with Jackson Latino, TPN can be d/ced after bag runs out at [place] (9/18)  - At 4PM today, reduce TPN rate by about half to 30 ml/hr and run bag at this rate for 2 hrs, then stop TPN bag at [place].  Informed pt's RN Jackelyn Poling) of plan for TPN.  - pharmacy will sign off.  Re-consult [place] if need further assistance  Cortasia Screws P 03/31/2019,7:20 AM

## 2019-03-31 NOTE — TOC Progression Note (Signed)
Transition of Care [facility]) - Progression Note    Patient Details  Name: Theresa Arnold MRN: [id number] Date of Birth: 05/29/1932  Transition of Care [facility]) CM/SW [place], LCSW Phone Number: 03/31/2019, 12:51 PM  Clinical Narrative:    Patient and Spouse agreeable to [facility] for continued care and therapy. Patient and spouse aware there are no visitors allowed at this time, until after day 20 at SNF if she is stays that long. Patient spouse request non emergency ambulance transport. Spouse would like to be at the hospital when the patient is transported. (CSW will notify TOC weekend staff). Patient COVID-19 will have to result negative before she can discharge. Patient and spouse aware.    Expected Discharge Plan: Skilled Nursing Facility Barriers to Discharge: (COVID-19 test)  Expected Discharge Plan and Services Expected Discharge Plan: [place] In-house Referral: Clinical Social Work Discharge Planning Services: CM Consult   Living arrangements for the past 2 months: Single Family Home Expected Discharge Date: (unknown)                                     Social Determinants of Health (SDOH) Interventions    Readmission Risk Interventions No flowsheet data found.

## 2019-03-31 NOTE — Progress Notes (Signed)
PROGRESS NOTE  SHARECE SMALLS [id number] DOB: 02/16/1932 DOA: 03/15/2019 PCP: Biagio Borg, MD   LOS: 16 days   Brief narrative: Patient is a 85 y.o.femalewith COPD, hyperlipidemia, and chronic sigmoid diverticulitis with known abscess and stricture in the distal sigmoid colon who presented to the ED for evaluation of abdominal pain. Patient follows with GI, Dr. Tarri Glenn, for known sigmoid diverticulitis and abscess. Per GI documentation, case was discussed with IR and fluid collection was not considered amenable to IR drainage. She underwent colonoscopy 01/27/2019 which was limited due to presence of stool. Distal sigmoid stricture was seen. She was reportedly referred to general surgery, [facility] Dr. Dema Severin, and has no present plans for surgical intervention.  Patient was seen in the ED on 03/11/2019 at which time a repeat CT abdomen/pelvis with contrast showed an apparent increase in proximal sigmoid colon wall thickening and unchanged 3.5 cm intramural sigmoid colonic wall abscess/collection which was present since 11/02/2018. There was question of low-grade obstruction. She was given a prescription for ciprofloxacin and metronidazole.  Patient stated that she has had continued generalized abdominal pain. She has not had oral intake due to the nausea after eating, unable to take her home medications including the recently prescribed antibiotics. She stated that she has not had any bowel movements despite use of MiraLAX for several days. She reported a 30 pound weight loss since last year. She has not had any subjective fevers. She reported good urine output without dysuria.  Assessment/Plan:  Principal Problem:   Sigmoid stricture (HCC) Active Problems:   COPD (chronic obstructive pulmonary disease) (HCC)   Diverticulitis of sigmoid colon   Abnormal CT scan, gastrointestinal tract   Colonic partial obstruction from sigmoid stricture   Polyp of sigmoid colon   Oral thrush  Hiccups   Hypoxia  Partial colonic obstruction from sigmoid stricture thought to be secondary to chronic recurrent diverticulitis. Patient underwent laparoscopic and mobilization of the splenic flexure, laparotomy and sigmoid colectomy, Hartmann procedure with end colostomy by Dr. Hassell Done on 9/9.  Surgery followed the patient and has initiated the patient on regular diet.  Patient still has a poor oral intake.  Patient has however improved overall consumption to 30% today.  Still on TPN.  To get calorie count.  Aim is to discontinue TPN prior to discharge physical exam today.  Acute metabolic encephalopathy -Now improved mental status at baseline.  COPD Stable, compensated.  Continue duo nebs.  Oral thrush Completed IV Diflucan.  Continue nystatin swish and swallow   Severe protein calorie malnutrition  Prealbumin 6.5 on 9/5, BMI 19, continue TPN until oral intake is adequate.  Has been advanced to regular diet by surgery yesterday.  She was encouraged on increasing oral intake.  Hiccups Improved, on Reglan, Thorazine as needed for hiccups  VTE Prophylaxis: Lovenox  Code Status: Full code  Family Communication: I spoke with the the patient's husband on the phone and updated him about the clinical condition of the patient and tentative plan for disposition yesterday.  Disposition Plan: Education officer, museum is on board for SNF.   Follow surgical recommendations on discharge.  Patient had a COVID-19 test sent on 03/31/2019.  If the patient continues to improve eating will consider discontinuation of TPN and discharge to skilled facility.   Consultants:  General surgery  GI  Procedures:  Acute abdominal series 03/15/2019  Laparoscopy and mobilization of splenic flexure: Laparotomy and sigmoid colectomy(Hartmann procedure)with end colostomy per Dr. Hassell Done 03/22/2019  PICC line 03/21/2019  Antibiotics:  Zosyn  09/02-09/10,  Diflucan 09/05-09/12  Anti-infectives (From admission,  onward)   Start     Dose/Rate Route Frequency Ordered Stop   03/22/19 2200  piperacillin-tazobactam (ZOSYN) IVPB 3.375 g     3.375 g 12.5 mL/hr over 240 Minutes Intravenous Every 8 hours 03/22/19 1725 03/23/19 0224   03/22/19 1115  cefoTEtan (CEFOTAN) 2 g in sodium chloride 0.9 % 100 mL IVPB     2 g 200 mL/hr over 30 Minutes Intravenous On call to O.R. 03/21/19 1829 03/22/19 1320   03/18/19 1900  fluconazole (DIFLUCAN) IVPB 100 mg     100 mg 50 mL/hr over 60 Minutes Intravenous Every 24 hours 03/18/19 1753 03/24/19 1918   03/16/19 0600  piperacillin-tazobactam (ZOSYN) IVPB 3.375 g  Status:  Discontinued     3.375 g 12.5 mL/hr over 240 Minutes Intravenous Every 8 hours 03/15/19 2316 03/22/19 1725   03/15/19 2315  piperacillin-tazobactam (ZOSYN) IVPB 3.375 g     3.375 g 100 mL/hr over 30 Minutes Intravenous NOW 03/15/19 2314 03/16/19 0038     Subjective: Patient denies interval complaints.  Nursing staff reported to 30% food consumption.  Patient was encouraged on eating more.  Denies any fever, chills or rigor.  Denies any nausea vomiting or abdominal pain.  Objective: Vitals:   03/31/19 0824 03/31/19 1337  BP:  (!) 112/41  Pulse:  95  Resp:  17  Temp:  98 F (36.7 C)  SpO2: 95% 99%    Intake/Output Summary (Last 24 hours) at 03/31/2019 1414 Last data filed at 03/31/2019 1336 Gross per 24 hour  Intake 1863.52 ml  Output 2040 ml  Net -176.48 ml   Filed Weights   03/24/19 0438 03/28/19 0809 03/31/19 0448  Weight: 49.4 kg 46.3 kg 49.3 kg   Body mass index is 19.88 kg/m.   Physical Exam: General: Thinly built , not in obvious distress HENT: Normocephalic, pupils equally reacting to light and accommodation.  No scleral pallor or icterus noted. Oral mucosa is moist.  Chest:  Clear breath sounds.  Diminished breath sounds bilaterally. No crackles or wheezes.  CVS: S1 &S2 heard. No murmur.  Regular rate and rhythm. Abdomen: Soft, nontender, nondistended.  Colostomy bag in  place with liquid stool.   Extremities: No cyanosis, clubbing or edema.  Peripheral pulses are palpable. Psych: Alert, awake and oriented, normal mood CNS:  No cranial nerve deficits.  Power equal in all extremities.  No sensory deficits noted.  No cerebellar signs.   Skin: Warm and dry.  No rashes noted.  Data Review: I have personally reviewed the following laboratory data and studies,  CBC: Recent Labs  Lab 03/25/19 0418 03/27/19 0430 03/28/19 0340  WBC 12.2* 9.7 10.4  NEUTROABS  --  6.7  --   HGB 9.3* 9.6* 8.9*  HCT 29.2* 29.5* 27.9*  MCV 99.3 97.4 97.9  PLT 175 213 [id number]   Basic Metabolic Panel: Recent Labs  Lab 03/25/19 0418 03/26/19 0501 03/27/19 0430 03/28/19 0340 03/29/19 0434 03/30/19 0356 03/31/19 0619  NA 136 135 134* 133* 133* 135 137  K 4.5 4.4 4.3 4.4 4.3 4.1 4.2  CL 103 102 101 101 104 105 105  CO2 27 26 25 23 24 27 24   GLUCOSE 169* 131* 119* 127* 108* 125* 121*  BUN 15 12 14 17 18 14 20   CREATININE 0.48 0.30* 0.41* 0.36* 0.33* 0.35* 0.35*  CALCIUM 8.1* 8.1* 8.4* 8.3* 8.3* 8.1* 8.3*  MG 2.2 2.3 2.2  --   --  2.3  --  PHOS 3.6 4.3 4.1  --   --  3.6  --    Liver Function Tests: Recent Labs  Lab 03/25/19 0418 03/26/19 0501 03/27/19 0430 03/30/19 0356 03/31/19 0619  AST 16 18 49* 108* 65*  ALT 5 6 13  60* 48*  ALKPHOS 41 45 69 130* 122  BILITOT 0.3 0.4 0.5 0.3 0.3  PROT 5.2* 5.7* 6.0* 5.6* 5.8*  ALBUMIN 2.2* 2.2* 2.2* 2.1* 2.1*   No results for input(s): LIPASE, AMYLASE in the last 168 hours. No results for input(s): AMMONIA in the last 168 hours. Cardiac Enzymes: No results for input(s): CKTOTAL, CKMB, CKMBINDEX, TROPONINI in the last 168 hours. BNP (last 3 results) No results for input(s): BNP in the last 8760 hours.  ProBNP (last 3 results) No results for input(s): PROBNP in the last 8760 hours.  CBG: Recent Labs  Lab 03/30/19 0646 03/30/19 1223 03/30/19 2129 03/31/19 0645 03/31/19 1153  GLUCAP 138* 123* 109* 112* 102*    Recent Results (from the past 240 hour(s))  Surgical pcr screen     Status: None   Collection Time: 03/21/19 11:56 PM   Specimen: Nasal Mucosa; Nasal Swab  Result Value Ref Range Status   MRSA, PCR NEGATIVE NEGATIVE Final   Staphylococcus aureus NEGATIVE NEGATIVE Final    Comment: (NOTE) The Xpert SA Assay (FDA approved for NASAL specimens in patients 52 years of age and older), is one component of a comprehensive surveillance program. It is not intended to diagnose infection nor to guide or monitor treatment. Performed at [facility], [place] [address] [place], [place] [address]      Studies: No results found.  Scheduled Meds: . acetaminophen (TYLENOL) oral liquid 160 mg/5 mL  650 mg Oral Q6H  . docusate  50 mg Oral Daily  . enoxaparin (LOVENOX) injection  30 mg Subcutaneous Q24H  . feeding supplement  1 Container Oral TID BM  . insulin aspart  0-15 Units Subcutaneous Q8H  . lip balm  1 application Topical BID  . mometasone-formoterol  2 puff Inhalation BID  . nystatin  5 mL Oral QID  . sodium chloride flush  10-40 mL Intracatheter Q12H  . sodium chloride flush  3 mL Intravenous Q12H  . umeclidinium bromide  1 puff Inhalation Daily    Continuous Infusions: . sodium chloride 250 mL (03/27/19 1825)  . chlorproMAZINE (THORAZINE) IV 12.5 mg (03/24/19 2151)  . methocarbamol (ROBAXIN) IV 500 mg (03/31/19 1009)  . TPN ADULT (ION) 70 mL/hr at 03/30/19 [place], MD  [facility] 03/31/2019

## 2019-04-01 DIAGNOSIS — K9189 Other postprocedural complications and disorders of digestive system: Secondary | ICD-10-CM

## 2019-04-01 DIAGNOSIS — K567 Ileus, unspecified: Secondary | ICD-10-CM

## 2019-04-01 LAB — PHOSPHORUS: Phosphorus: 4.1 mg/dL (ref 2.5–4.6)

## 2019-04-01 LAB — BASIC METABOLIC PANEL
Anion gap: 7 (ref 5–15)
BUN: 20 mg/dL (ref 8–23)
CO2: 25 mmol/L (ref 22–32)
Calcium: 8.3 mg/dL — ABNORMAL LOW (ref 8.9–10.3)
Chloride: 108 mmol/L (ref 98–111)
Creatinine, Ser: 0.41 mg/dL — ABNORMAL LOW (ref 0.44–1.00)
GFR calc Af Amer: >60 mL/min (ref >60)
GFR calc non Af Amer: >60 mL/min (ref >60)
Glucose, Bld: 98 mg/dL (ref 70–99)
Potassium: 3.8 mmol/L (ref 3.5–5.1)
Sodium: 140 mmol/L (ref 135–145)

## 2019-04-01 LAB — NOVEL CORONAVIRUS, NAA (HOSP ORDER, SEND-OUT TO REF LAB; TAT 18-24 HRS): SARS-CoV-2, NAA: NOT DETECTED

## 2019-04-01 LAB — MAGNESIUM: Magnesium: 2.1 mg/dL (ref 1.7–2.4)

## 2019-04-01 NOTE — Progress Notes (Addendum)
PROGRESS NOTE    Theresa Arnold  [id number]  DOB: 26-May-1932  DOA: 03/15/2019 PCP: Biagio Borg, MD  Brief Narrative:  85 y.o.femalewith COPD, hyperlipidemia, and chronic sigmoid diverticulitis with known abscess and stricture in the distal sigmoid colon who presented to the ED on 9/2 for evaluation of recurrent abdominal pain, poor oral intake, nausea and vomiting.Patient well-known to GI service and has been following them as outpatient since April 2020.  She has had intermittent abdominal pain and weight loss attributed to a low-grade diverticular stricture.  She had CT scan in the past showing probable diverticulitis/intramural process/low-grade stricture for which she was recommended elective sigmoidectomy but she had declined surgical option. Patient admitted to hospitalist service for chronic recurrent sigmoid diverticulitis with distal sigmoid stricture/possible distal colon obstruction with IV Zosyn and GI/general surgery services consulted.  Patient underwent laparoscopic mobilization of the splenic flexure, laparotomy and sigmoid colectomy Jeanette Caprice procedure) with end colostomy, Dr. Hassell Done, 03/22/2019-Pathology benign showed diverticulitis. She received IV Zosyn from 9/2-9/10 and Diflucan 9/5-9/12.  Hospital course complicated by postop ileus as seen on x-ray 9/14, now improving.TPN started in concern for poor oral intake and hypoalbuminemia (prealbumin 5.6 on 9/9).  Now tolerating diet better.  Subjective:  Patient resting comfortably.  States does not like hospital food but appetite better.  Reports postop site pain 7/10 and receiving pain medications.  Been off TPN since 9/17.  Afebrile   Objective: Vitals:   04/01/19 0533 04/01/19 0828 04/01/19 1353 04/01/19 1823  BP: 113/61  126/64   Pulse: 83  72   Resp: 20  20   Temp: 98.5 F (36.9 C)  98 F (36.7 C)   TempSrc: Oral  Oral   SpO2: 100% 100% 100% 96%  Weight:      Height:        Intake/Output Summary (Last 24  hours) at 04/01/2019 2017 Last data filed at 04/01/2019 1754 Gross per 24 hour  Intake 463.38 ml  Output 1250 ml  Net -786.62 ml   Filed Weights   03/28/19 0809 03/31/19 0448 04/01/19 0500  Weight: 46.3 kg 49.3 kg 47.8 kg    Physical Examination:  General exam: Appears calm and comfortable  Respiratory system: Clear to auscultation. Respiratory effort normal. Cardiovascular system: S1 & S2 heard, RRR. No JVD, murmurs, rubs, gallops or clicks. No pedal edema. Gastrointestinal system: Midline incision clean with glue intact , no signs of infection.  Ostomy bag draining liquidy stool.  Abdomen is nondistended, soft, mild expected tenderness at surgical site. No organomegaly or masses felt. Normal bowel sounds heard. Central nervous system: Alert and oriented. No focal neurological deficits. Extremities: Symmetric 5 x 5 power. Skin: No rashes, lesions or ulcers Psychiatry: Judgement and insight appear normal. Mood & affect appropriate.     Data Reviewed: I have personally reviewed following labs and imaging studies  CBC: Recent Labs  Lab 03/27/19 0430 03/28/19 0340  WBC 9.7 10.4  NEUTROABS 6.7  --   HGB 9.6* 8.9*  HCT 29.5* 27.9*  MCV 97.4 97.9  PLT 213 [id number]   Basic Metabolic Panel: Recent Labs  Lab 03/26/19 0501 03/27/19 0430 03/28/19 0340 03/29/19 0434 03/30/19 0356 03/31/19 0619 04/01/19 0421  NA 135 134* 133* 133* 135 137 140  K 4.4 4.3 4.4 4.3 4.1 4.2 3.8  CL 102 101 101 104 105 105 108  CO2 26 25 23 24 27 24 25   GLUCOSE 131* 119* 127* 108* 125* 121* 98  BUN 12 14 17 18 14  20  20  CREATININE 0.30* 0.41* 0.36* 0.33* 0.35* 0.35* 0.41*  CALCIUM 8.1* 8.4* 8.3* 8.3* 8.1* 8.3* 8.3*  MG 2.3 2.2  --   --  2.3  --  2.1  PHOS 4.3 4.1  --   --  3.6  --  4.1   GFR: Estimated Creatinine Clearance: 39.5 mL/min (A) (by C-G formula based on SCr of 0.41 mg/dL (L)). Liver Function Tests: Recent Labs  Lab 03/26/19 0501 03/27/19 0430 03/30/19 0356 03/31/19 0619  AST 18  49* 108* 65*  ALT 6 13 60* 48*  ALKPHOS 45 69 130* 122  BILITOT 0.4 0.5 0.3 0.3  PROT 5.7* 6.0* 5.6* 5.8*  ALBUMIN 2.2* 2.2* 2.1* 2.1*   No results for input(s): LIPASE, AMYLASE in the last 168 hours. No results for input(s): AMMONIA in the last 168 hours. Coagulation Profile: No results for input(s): INR, PROTIME in the last 168 hours. Cardiac Enzymes: No results for input(s): CKTOTAL, CKMB, CKMBINDEX, TROPONINI in the last 168 hours. BNP (last 3 results) No results for input(s): PROBNP in the last 8760 hours. HbA1C: No results for input(s): HGBA1C in the last 72 hours. CBG: Recent Labs  Lab 03/30/19 2129 03/31/19 0645 03/31/19 1153 03/31/19 1752 03/31/19 2118  GLUCAP 109* 112* 102* 82 101*   Lipid Profile: No results for input(s): CHOL, HDL, LDLCALC, TRIG, CHOLHDL, LDLDIRECT in the last 72 hours. Thyroid Function Tests: No results for input(s): TSH, T4TOTAL, FREET4, T3FREE, THYROIDAB in the last 72 hours. Anemia Panel: No results for input(s): VITAMINB12, FOLATE, FERRITIN, TIBC, IRON, RETICCTPCT in the last 72 hours. Sepsis Labs: No results for input(s): PROCALCITON, LATICACIDVEN in the last 168 hours.  Recent Results (from the past 240 hour(s))  Novel Coronavirus, NAA (hospital order; send-out to ref lab)     Status: None   Collection Time: 03/31/19 10:18 AM   Specimen: Nasopharyngeal Swab; Respiratory  Result Value Ref Range Status   SARS-CoV-2, NAA NOT DETECTED NOT DETECTED Final    Comment: (NOTE) This nucleic acid amplification test was developed and its performance characteristics determined by [employer]. Nucleic acid amplification tests include PCR and TMA. This test has not been FDA cleared or approved. This test has been authorized by FDA under an Emergency Use Authorization (EUA). This test is only authorized for the duration of time the declaration that circumstances exist justifying the authorization of the emergency use of in vitro  diagnostic tests for detection of SARS-CoV-2 virus and/or diagnosis of COVID-19 infection under section 564(b)(1) of the Act, 21 U.S.C. [id number]) (1), unless the authorization is terminated or revoked sooner. When diagnostic testing is negative, the possibility of a false negative result should be considered in the context of a patient's recent exposures and the presence of clinical signs and symptoms consistent with COVID-19. An individual without symptoms of COVID- 19 and who is not shedding SARS-CoV-2 vi rus would expect to have a negative (not detected) result in this assay. Performed At: [facility] [place], [place] [id number] Rush Farmer MD [id number]    [place]  Final    Comment: Performed at [place] [address] [place], [place] [address]      Radiology Studies: No results found.      Scheduled Meds: . acetaminophen (TYLENOL) oral liquid 160 mg/5 mL  650 mg Oral Q6H  . docusate  50 mg Oral Daily  . enoxaparin (LOVENOX) injection  30 mg Subcutaneous Q24H  . feeding supplement  1 Container Oral TID  BM  . lip balm  1 application Topical BID  . mometasone-formoterol  2 puff Inhalation BID  . sodium chloride flush  10-40 mL Intracatheter Q12H  . sodium chloride flush  3 mL Intravenous Q12H  . umeclidinium bromide  1 puff Inhalation Daily   Continuous Infusions: . sodium chloride 250 mL (03/31/19 1417)  . chlorproMAZINE (THORAZINE) IV 12.5 mg (03/24/19 2151)  . methocarbamol (ROBAXIN) IV 500 mg (04/01/19 1553)    Assessment & Plan:    1.Partial colonic obstruction due to chronic recurrent sigmoid diverticulitis/stricture-S/P laparoscopic and mobilization of the splenic flexure, laparotomy and sigmoid colectomy (Hartmann procedure) with end colostomy on 03/22/2019 Pathology benign showed diverticulitis.  Now off IV antibiotics.  Advanced diet but limited oral intake partly due to not  liking hospital food.  Postop incision clean with sutures in place.  Will need outpatient follow-up with Dr. Hassell Done and okay for discharge per general surgery if continues to eat well.  2.  Postop ileus: Now resolved.  Noted that patient been on IV Robaxin since 9/14.  Not sure of the indication.  Will DC as it may exacerbate this condition.  Diet as tolerated  3.  Severe protein calorie malnutrition: In the setting of chronic diverticulitis/low-grade stricture/poor oral intake resulting in significant weight loss (30 pounds).  Prealbumin level improved from 5.6-8.1.  Oral intake slowly improving.  Okay to be off TPN per general surgery.  4. Acute metabolic encephalopathy-Now improved mental status at baseline.  5.COPD-Stable, compensated.  Continue duo nebs.  6. Oral thrush-Completed IV Diflucan. Continue nystatin swish and swallow   7. Hiccups-Improved, on Reglan, Thorazine as needed for hiccups  DVT prophylaxis: Lovenox Code Status: Full code Family / Patient Communication: Discussed with patient Disposition Plan: She was living with her husband prior to admission.  Seen by PT who is recommended skilled nursing facility.    LOS: 17 days    Time spent: 25 minutes    Guilford Shi, MD [facility] Pager [phone]  If 7PM-7AM, please contact night-coverage [email or web address] Password [facility] 04/01/2019, 8:17 PM

## 2019-04-01 NOTE — Progress Notes (Signed)
[facility]/Trauma Progress Note  10 Days Post-Op   Assessment/Plan COPD (chronic obstructive pulmonary disease) (HCC) Diverticulitis of sigmoid colon Abnormal CT scan, gastrointestinal tract Colonic partial obstruction from sigmoid stricture Polyp of sigmoid colon Oral thrush  Partial colonic obstruction from sigmoid stricture presumed due to chronic recurrent diverticulitis -S/P laparoscopic and mobilization of the splenic flexure, laparotomy and sigmoid colectomy Theresa Arnold procedure) with end colostomy, Dr. Hassell Done, 03/22/2019 -Pathology benign showed diverticulitis Postop ileus -resolved Severe malnutrition -Prealbumin8.1on09/14 -okay to wean or stop TPN  [id number] diet- patient doesn't like, but ok to choose anything.   VTE: SCD's, lovenox [id number] 09/02-09/10, Diflucan 09/05-09/12,WBC10.49/15, afebrile Foley:Removed POD 1 Follow up:Dr. Hassell Done  DISPO:Patient is tolerating a regular diet.  Off TNA.   Patient is okay for discharge from a surgical standpoint   LOS: 17 days    Subjective: CC: No complaints  Needs some pain meds this AM.  Hasn't had any this AM.  No n/v.    Objective: Vital signs in last 24 hours: Temp:  [97.8 F (36.6 C)-98.5 F (36.9 C)] 98.5 F (36.9 C) (09/19 0533) Pulse Rate:  [83-95] 83 (09/19 0533) Resp:  [16-20] 20 (09/19 0533) BP: (112-113)/(41-61) 113/61 (09/19 0533) SpO2:  [98 %-100 %] 100 % (09/19 0828) Weight:  [47.8 kg] 47.8 kg (09/19 0500) Last BM Date: 03/31/19  Intake/Output from previous day: 09/18 0701 - 09/19 0700 In: 1839.2 [P.O.:600; I.V.:1039.2; IV Piggyback:200] Out: 1920 [Urine:1400; Stool:520] Intake/Output this shift: Total I/O In: -  Out: 100 [Stool:100]  PE: Gen: Alert, NAD, pleasant, cooperative Pulm: Rate and effort normal Abd: Soft,nodistention, midline with glueintact and iswithout signs of infection, LLQ colostomy, stoma is pink, stool, gas in bag.  Stool  is firming up.  Skin: no rashes noted, warm and dry   Anti-infectives: Anti-infectives (From admission, onward)   Start     Dose/Rate Route Frequency Ordered Stop   03/22/19 2200  piperacillin-tazobactam (ZOSYN) IVPB 3.375 g     3.375 g 12.5 mL/hr over 240 Minutes Intravenous Every 8 hours 03/22/19 1725 03/23/19 0224   03/22/19 1115  cefoTEtan (CEFOTAN) 2 g in sodium chloride 0.9 % 100 mL IVPB     2 g 200 mL/hr over 30 Minutes Intravenous On call to O.R. 03/21/19 1829 03/22/19 1320   03/18/19 1900  fluconazole (DIFLUCAN) IVPB 100 mg     100 mg 50 mL/hr over 60 Minutes Intravenous Every 24 hours 03/18/19 1753 03/24/19 1918   03/16/19 0600  piperacillin-tazobactam (ZOSYN) IVPB 3.375 g  Status:  Discontinued     3.375 g 12.5 mL/hr over 240 Minutes Intravenous Every 8 hours 03/15/19 2316 03/22/19 1725   03/15/19 2315  piperacillin-tazobactam (ZOSYN) IVPB 3.375 g     3.375 g 100 mL/hr over 30 Minutes Intravenous NOW 03/15/19 2314 03/16/19 0038      Lab Results:  No results for input(s): WBC, HGB, HCT, PLT in the last 72 hours. BMET Recent Labs    03/31/19 0619 04/01/19 0421  NA 137 140  K 4.2 3.8  CL 105 108  CO2 24 25  GLUCOSE 121* 98  BUN 20 20  CREATININE 0.35* 0.41*  CALCIUM 8.3* 8.3*   PT/INR No results for input(s): LABPROT, INR in the last 72 hours. CMP     Component Value Date/Time   NA 140 04/01/2019 0421   K 3.8 04/01/2019 0421   CL 108 04/01/2019 0421   CO2 25 04/01/2019 0421   GLUCOSE 98 04/01/2019 0421   BUN 20 04/01/2019 0421  CREATININE 0.41 (L) 04/01/2019 0421   CALCIUM 8.3 (L) 04/01/2019 0421   PROT 5.8 (L) 03/31/2019 0619   ALBUMIN 2.1 (L) 03/31/2019 0619   AST 65 (H) 03/31/2019 0619   ALT 48 (H) 03/31/2019 0619   ALKPHOS 122 03/31/2019 0619   BILITOT 0.3 03/31/2019 0619   GFRNONAA >60 04/01/2019 0421   GFRAA >60 04/01/2019 0421   Lipase     Component Value Date/Time   LIPASE 11.0 11/02/2018 1103    Studies/Results: No results  found.  Milus Height, MD FACS Surgical Oncology, General Surgery, Trauma and [facility], [place] [phone] Check [email or web address], password [email or web address] for coverage night/weekend

## 2019-04-02 DIAGNOSIS — Z7401 Bed confinement status: Secondary | ICD-10-CM | POA: Diagnosis not present

## 2019-04-02 DIAGNOSIS — K56609 Unspecified intestinal obstruction, unspecified as to partial versus complete obstruction: Secondary | ICD-10-CM | POA: Diagnosis not present

## 2019-04-02 DIAGNOSIS — K56699 Other intestinal obstruction unspecified as to partial versus complete obstruction: Secondary | ICD-10-CM | POA: Diagnosis not present

## 2019-04-02 DIAGNOSIS — R066 Hiccough: Secondary | ICD-10-CM | POA: Diagnosis not present

## 2019-04-02 DIAGNOSIS — J449 Chronic obstructive pulmonary disease, unspecified: Secondary | ICD-10-CM | POA: Diagnosis not present

## 2019-04-02 DIAGNOSIS — R278 Other lack of coordination: Secondary | ICD-10-CM | POA: Diagnosis not present

## 2019-04-02 DIAGNOSIS — R41841 Cognitive communication deficit: Secondary | ICD-10-CM | POA: Diagnosis not present

## 2019-04-02 DIAGNOSIS — I469 Cardiac arrest, cause unspecified: Secondary | ICD-10-CM | POA: Diagnosis not present

## 2019-04-02 DIAGNOSIS — G9341 Metabolic encephalopathy: Secondary | ICD-10-CM | POA: Diagnosis not present

## 2019-04-02 DIAGNOSIS — E43 Unspecified severe protein-calorie malnutrition: Secondary | ICD-10-CM

## 2019-04-02 DIAGNOSIS — J984 Other disorders of lung: Secondary | ICD-10-CM | POA: Diagnosis not present

## 2019-04-02 DIAGNOSIS — U071 COVID-19: Secondary | ICD-10-CM | POA: Diagnosis not present

## 2019-04-02 DIAGNOSIS — K5649 Other impaction of intestine: Secondary | ICD-10-CM | POA: Diagnosis not present

## 2019-04-02 DIAGNOSIS — K5732 Diverticulitis of large intestine without perforation or abscess without bleeding: Secondary | ICD-10-CM | POA: Diagnosis not present

## 2019-04-02 DIAGNOSIS — R52 Pain, unspecified: Secondary | ICD-10-CM | POA: Diagnosis not present

## 2019-04-02 DIAGNOSIS — Z933 Colostomy status: Secondary | ICD-10-CM | POA: Diagnosis not present

## 2019-04-02 DIAGNOSIS — Z433 Encounter for attention to colostomy: Secondary | ICD-10-CM | POA: Diagnosis not present

## 2019-04-02 DIAGNOSIS — R2681 Unsteadiness on feet: Secondary | ICD-10-CM | POA: Diagnosis not present

## 2019-04-02 DIAGNOSIS — Z48815 Encounter for surgical aftercare following surgery on the digestive system: Secondary | ICD-10-CM | POA: Diagnosis not present

## 2019-04-02 DIAGNOSIS — M2550 Pain in unspecified joint: Secondary | ICD-10-CM | POA: Diagnosis not present

## 2019-04-02 DIAGNOSIS — B370 Candidal stomatitis: Secondary | ICD-10-CM | POA: Diagnosis not present

## 2019-04-02 DIAGNOSIS — R2689 Other abnormalities of gait and mobility: Secondary | ICD-10-CM | POA: Diagnosis not present

## 2019-04-02 DIAGNOSIS — M6281 Muscle weakness (generalized): Secondary | ICD-10-CM | POA: Diagnosis not present

## 2019-04-02 DIAGNOSIS — R1084 Generalized abdominal pain: Secondary | ICD-10-CM | POA: Diagnosis not present

## 2019-04-02 DIAGNOSIS — E876 Hypokalemia: Secondary | ICD-10-CM | POA: Diagnosis not present

## 2019-04-02 DIAGNOSIS — R6889 Other general symptoms and signs: Secondary | ICD-10-CM | POA: Diagnosis not present

## 2019-04-02 DIAGNOSIS — K567 Ileus, unspecified: Secondary | ICD-10-CM | POA: Diagnosis not present

## 2019-04-02 DIAGNOSIS — E440 Moderate protein-calorie malnutrition: Secondary | ICD-10-CM | POA: Diagnosis not present

## 2019-04-02 MED ORDER — MEGESTROL ACETATE 400 MG/10ML PO SUSP
200.0000 mg | Freq: Every day | ORAL | Status: DC
  Administered 2019-04-02: 10:00:00 200 mg via ORAL
  Filled 2019-04-02: qty 10

## 2019-04-02 MED ORDER — MAGIC MOUTHWASH: 15.0000 mL | Freq: Four times a day (QID) | ORAL | 0 refills | Status: DC | PRN

## 2019-04-02 MED ORDER — OXYCODONE HCL 5 MG PO TABS: 5.0000 mg | ORAL_TABLET | Freq: Four times a day (QID) | ORAL | 0 refills | Status: AC | PRN

## 2019-04-02 MED ORDER — ALUM & MAG HYDROXIDE-SIMETH 200-200-20 MG/5ML PO SUSP: 30.0000 mL | Freq: Four times a day (QID) | ORAL | 0 refills | Status: DC | PRN

## 2019-04-02 MED ORDER — CHLORPROMAZINE HCL 10 MG PO TABS: 10.0000 mg | ORAL_TABLET | Freq: Three times a day (TID) | ORAL | Status: DC | PRN

## 2019-04-02 MED ORDER — IPRATROPIUM-ALBUTEROL 0.5-2.5 (3) MG/3ML IN SOLN: 3.0000 mL | RESPIRATORY_TRACT | Status: DC | PRN

## 2019-04-02 NOTE — Plan of Care (Signed)
Assessment unchanged. Discharged via [facility]. Patient to transport to [facility]. Report called to facility and given to Sampson Goon, RN.

## 2019-04-02 NOTE — Progress Notes (Signed)
D/C orders/summary and SNF transfer report sent to [facility].  CSW received a call from [place] at [facility] stating the patient has been offered a bed and has been accepted and that the pt can arrive on 04/02/19  The pt's accepting doctor is SNF MD.  The room number will be 1206-P.  The number for report [phone].  CSW will updated RN/EDP.  Alphonse Guild. [place] Catlin, Latanya Presser, LCAS Clinical Social Worker Ph: [phone]

## 2019-04-02 NOTE — Progress Notes (Signed)
PICC line removed per order and line is intact. Vaseline gauze and gauze dressing applied and pressure held. Site clean, dry, and intact. Patient and RN aware that patient is on bedrest for 30 minutes until 1345. Patient aware to leave dressing on and dry for 24 hours.

## 2019-04-02 NOTE — TOC Transition Note (Signed)
Transition of Care [facility]) - CM/SW Discharge Note   Patient Details  Name: [name] MRN: [id number] Date of Birth: August 22, 1931  Transition of Care [facility]) CM/SW Contact:  Claudine Mouton, LCSW Phone Number: 04/02/2019, 3:06 PM   Clinical Narrative:   D/C orders/summary and SNF transfer report sent to [facility].  CSW received a call from [place] at [facility] stating the patient has been offered a bed and has been accepted and that the pt can arrive on 04/02/19  The pt's accepting doctor is SNF MD.  The room number will be 1206-P.  The number for report [phone].       Final next level of care: Skilled Nursing Facility Barriers to Discharge: No Barriers Identified   Patient Goals and CMS Choice Patient states their goals for this hospitalization and ongoing recovery are:: "Get well". CMS Medicare.gov Compare Post Acute Care list provided to:: Patient Choice offered to / list presented to : Patient  Discharge Placement              Patient [facility] bed at: [facility] Patient to be transferred to facility by: [facility] Name of family member notified: Pt's spouse Theresa Arnold [phone] Patient and family notified of of transfer: 04/02/19  Discharge Plan and Services In-house Referral: Clinical Social Work Discharge Planning Services: CM Consult                                 Social Determinants of Health (SDOH) Interventions     Readmission Risk Interventions No flowsheet data found.

## 2019-04-02 NOTE — Progress Notes (Signed)
CSW updated pt's spouse Lastinger,Ernie [phone].  CSW will continue to follow for D/C needs.  Alphonse Guild. Harish Bram, LCSW, LCAS, CSI Transitions of Care Clinical Social Worker Care Coordination Department Ph: [phone]

## 2019-04-02 NOTE — Discharge Summary (Addendum)
Physician Discharge Summary  Theresa Arnold [id number] DOB: [birth year] DOA: 03/15/2019  PCP: Biagio Borg, MD  Admit date: 03/15/2019 Discharge date: 04/02/2019 Consultations: General Surgery Admitted From: Home Disposition: [facility]  Discharge Diagnoses:  Principal Problem:   Sigmoid stricture ([place]) Active Problems:   COPD (chronic obstructive pulmonary disease) ([place])   Diverticulitis of sigmoid colon   Abnormal CT scan, gastrointestinal tract   Colonic partial obstruction from sigmoid stricture   Polyp of sigmoid colon   Oral thrush   Hiccups   Protein Calorie Malnutrition   Hospital Course Summary: 85 y.o.femalewith COPD, hyperlipidemia, and chronic sigmoid diverticulitis with known abscess and stricture in the distal sigmoid colon who presented to the ED on 9/2 for evaluation of recurrent abdominal pain, poor oral intake, nausea and vomiting.Patient well-known to GI service and has been following them as outpatient since April 2020.  She has had intermittent abdominal pain and weight loss attributed to a low-grade diverticular stricture.  She had CT scan in the past showing probable diverticulitis/intramural process/low-grade stricture for which she was recommended elective sigmoidectomy but she had declined surgical option. Patient admitted to hospitalist service for chronic recurrent sigmoid diverticulitis with distal sigmoid stricture/possible distal colon obstruction with IV Zosyn and GI/general surgery services consulted.  Patient underwent laparoscopic mobilization of the splenic flexure, laparotomy and sigmoid colectomy Jeanette Caprice procedure) with end colostomy, Dr. Hassell Done, 03/22/2019-Pathology benign showed diverticulitis. She received IV Zosyn from 9/2-9/10 and Diflucan 9/5-9/12.  Hospital course complicated by postop ileus as seen on x-ray 9/14, now improving.TPN started in concern for poor oral intake and hypoalbuminemia (prealbumin 5.6 on 9/9).  Now tolerating diet better  and off TPN   1.Partial colonic obstruction due to chronic recurrent sigmoid diverticulitis/stricture-S/P laparoscopic and mobilization of the splenic flexure, laparotomy and sigmoid colectomy (Hartmann procedure) with end colostomy on 03/22/2019. Pathology benign showed diverticulitis.  Now off IV antibiotics.  Advanced diet but limited oral intake partly due to not liking hospital food.  Postop incision clean with glue in place.  Will need outpatient follow-up with Dr. Hassell Done and okay for discharge per general surgery. Opiate script issued for post pain relief prn. Watch for exacerbation of #2. Acetaminophen recommended for mild pain.   2.  Postop ileus: Now resolved. Diet as tolerated. Avoid constipation. Stool softeners and laxatives prn while on opiates for pain control.  3.  Severe protein calorie malnutrition: In the setting of chronic diverticulitis/low-grade stricture/poor oral intake resulting in significant weight loss (30 pounds).  Prealbumin level improved from 5.6-8.1.  Oral intake slowly improving.  Okay to be off TPN per general surgery. Been on Ensure /dietery supplements here-can resume at SNF  4. Acute metabolic encephalopathy- POA in the setting of acute illness. Now improved mental status at baseline.  5.COPD-Stable, compensated. Continue duo nebs.  6. Oral thrush-Completed IV Diflucan. Continue Magic mouth wash PRN  7. Hiccups-Improved, continue Thorazine as needed for hiccups  8. Disposition:  She was living with her husband prior to admission.  Seen by PT who is recommended skilled nursing facility. COVID screen negative from 9/18 and has bed available at SNF today   Discharge Exam:  Vitals:   04/01/19 2027 04/02/19 0514  BP: 107/62 119/65  Pulse: 75 67  Resp: 16 16  Temp: 98.2 F (36.8 C) 98.1 F (36.7 C)  SpO2: 98% 99%   Vitals:   04/01/19 1353 04/01/19 1823 04/01/19 2027 04/02/19 0514  BP: 126/64  107/62 119/65  Pulse: 72  75 67  Resp: 20  16 16   Temp: 98 F (36.7 C)  98.2 F (36.8 C) 98.1 F (36.7 C)  TempSrc: Oral  Oral Oral  SpO2: 100% 96% 98% 99%  Weight:      Height:       General exam: Appears calm and comfortable  Respiratory system: Clear to auscultation. Respiratory effort normal. Cardiovascular system: S1 & S2 heard, RRR. No JVD, murmurs, rubs, gallops or clicks. No pedal edema. Gastrointestinal system: Midline incision clean with glue, no signs of infection.  Ostomy bag draining liquidy stool.  Abdomen is nondistended, soft, mild expected tenderness at surgical site. No organomegaly or masses felt. Normal bowel sounds heard. Central nervous system: Alert and oriented. No focal neurological deficits. Extremities: Symmetric 5 x 5 power. Skin: No rashes, lesions or ulcers Psychiatry: Judgement and insight appear normal. Mood & affect appropriate.     Discharge Condition:Stable CODE STATUS: Full code Diet recommendation: Regular diet with protein supplements Recommendations for Outpatient Follow-up:  1. Follow up with PCP: at SNF 2. Follow up with consultants: GS Dr Hassell Done in 10 days 3. Please obtain follow up labs including: CBC/BMP/prealbumin level in 5 days   Equipment/Devices upon discharge: New colostomy   Discharge Instructions:  Discharge Instructions    Call MD for:  extreme fatigue   Complete by: As directed    Call MD for:  persistant nausea and vomiting   Complete by: As directed    Call MD for:  redness, tenderness, or signs of infection (pain, swelling, redness, odor or green/yellow discharge around incision site)   Complete by: As directed    Call MD for:  severe uncontrolled pain   Complete by: As directed    Call MD for:  temperature >100.4   Complete by: As directed    Diet - low sodium heart healthy   Complete by: As directed    Increase activity slowly   Complete by: As directed      Allergies as of 04/02/2019   No Known Allergies     Medication List    STOP taking these  medications   ciprofloxacin 500 MG tablet Commonly known as: Cipro   dicyclomine 10 MG capsule Commonly known as: BENTYL   metroNIDAZOLE 500 MG tablet Commonly known as: Flagyl   traMADol 50 MG tablet Commonly known as: ULTRAM     TAKE these medications   alum & mag hydroxide-simeth 200-200-20 MG/5ML suspension Commonly known as: MAALOX/MYLANTA Take 30 mLs by mouth every 6 (six) hours as needed for indigestion or heartburn (or bloating).   chlorproMAZINE 10 MG tablet Commonly known as: THORAZINE Take 1 tablet (10 mg total) by mouth 3 (three) times daily as needed for hiccoughs.   ipratropium-albuterol 0.5-2.5 (3) MG/3ML Soln Commonly known as: DUONEB Take 3 mLs by nebulization every 4 (four) hours as needed.   magic mouthwash Soln Take 15 mLs by mouth 4 (four) times daily as needed for mouth pain (sore throat).   ondansetron 4 MG tablet Commonly known as: Zofran Take 1 tablet (4 mg total) by mouth every 8 (eight) hours as needed for nausea or vomiting.   oxyCODONE 5 MG immediate release tablet Commonly known as: Roxicodone Take 1 tablet (5 mg total) by mouth every 6 (six) hours as needed for up to 5 days for severe pain.   polyethylene glycol 17 g packet Commonly known as: MIRALAX / GLYCOLAX Take 17 g by mouth daily as needed for mild constipation.       Contact information for follow-up  providers    [facility]. Go on 04/26/2019.   Why: at 9:15am for your follow-up appointment.  Please arrive 30 minutes early to complete paperwork.  Please bring photo ID and insurance card.  If coming from [facility], a family member or staff member will need to stay with you for your appointment.  Contact information: [address] [place], [place]           Contact information for after-discharge care    Destination    [facility] Preferred SNF .   Service: Skilled Nursing Contact information: [place] [phone]                 No Known Allergies    The results of significant diagnostics from this hospitalization (including imaging, microbiology, ancillary and laboratory) are listed below for reference.    Labs: BNP (last 3 results) No results for input(s): BNP in the last 8760 hours. Basic Metabolic Panel: Recent Labs  Lab 03/27/19 0430 03/28/19 0340 03/29/19 0434 03/30/19 0356 03/31/19 0619 04/01/19 0421  NA 134* 133* 133* 135 137 140  K 4.3 4.4 4.3 4.1 4.2 3.8  CL 101 101 104 105 105 108  CO2 25 23 24 27 24 25   GLUCOSE 119* 127* 108* 125* 121* 98  BUN 14 17 18 14 20 20   CREATININE 0.41* 0.36* 0.33* 0.35* 0.35* 0.41*  CALCIUM 8.4* 8.3* 8.3* 8.1* 8.3* 8.3*  MG 2.2  --   --  2.3  --  2.1  PHOS 4.1  --   --  3.6  --  4.1   Liver Function Tests: Recent Labs  Lab 03/27/19 0430 03/30/19 0356 03/31/19 0619  AST 49* 108* 65*  ALT 13 60* 48*  ALKPHOS 69 130* 122  BILITOT 0.5 0.3 0.3  PROT 6.0* 5.6* 5.8*  ALBUMIN 2.2* 2.1* 2.1*   No results for input(s): LIPASE, AMYLASE in the last 168 hours. No results for input(s): AMMONIA in the last 168 hours. CBC: Recent Labs  Lab 03/27/19 0430 03/28/19 0340  WBC 9.7 10.4  NEUTROABS 6.7  --   HGB 9.6* 8.9*  HCT 29.5* 27.9*  MCV 97.4 97.9  PLT 213 198   Cardiac Enzymes: No results for input(s): CKTOTAL, CKMB, CKMBINDEX, TROPONINI in the last 168 hours. BNP: Invalid input(s): POCBNP CBG: Recent Labs  Lab 03/30/19 2129 03/31/19 0645 03/31/19 1153 03/31/19 1752 03/31/19 2118  GLUCAP 109* 112* 102* 82 101*   D-Dimer No results for input(s): DDIMER in the last 72 hours. Hgb A1c No results for input(s): HGBA1C in the last 72 hours. Lipid Profile No results for input(s): CHOL, HDL, LDLCALC, TRIG, CHOLHDL, LDLDIRECT in the last 72 hours. Thyroid function studies No results for input(s): TSH, T4TOTAL, T3FREE, THYROIDAB in the last 72 hours.  Invalid input(s): FREET3 Anemia work  up No results for input(s): VITAMINB12, FOLATE, FERRITIN, TIBC, IRON, RETICCTPCT in the last 72 hours. Urinalysis    Component Value Date/Time   COLORURINE STRAW (A) 03/11/2019 1906   APPEARANCEUR CLEAR 03/11/2019 1906   LABSPEC 1.003 (L) 03/11/2019 1906   PHURINE 8.0 03/11/2019 1906   GLUCOSEU NEGATIVE 03/11/2019 1906   GLUCOSEU NEGATIVE 11/02/2018 1103   HGBUR NEGATIVE 03/11/2019 1906   BILIRUBINUR NEGATIVE 03/11/2019 1906   KETONESUR NEGATIVE 03/11/2019 1906   PROTEINUR NEGATIVE 03/11/2019 1906   UROBILINOGEN 0.2 11/02/2018 1103   NITRITE NEGATIVE 03/11/2019 1906   LEUKOCYTESUR NEGATIVE 03/11/2019 1906   Sepsis Labs Invalid  input(s): PROCALCITONIN,  WBC,  LACTICIDVEN Microbiology Recent Results (from the past 240 hour(s))  Novel Coronavirus, NAA (hospital order; send-out to ref lab)     Status: None   Collection Time: 03/31/19 10:18 AM   Specimen: Nasopharyngeal Swab; Respiratory  Result Value Ref Range Status   SARS-CoV-2, NAA NOT DETECTED NOT DETECTED Final    Comment: (NOTE) This nucleic acid amplification test was developed and its performance characteristics determined by [employer]. Nucleic acid amplification tests include PCR and TMA. This test has not been FDA cleared or approved. This test has been authorized by FDA under an Emergency Use Authorization (EUA). This test is only authorized for the duration of time the declaration that circumstances exist justifying the authorization of the emergency use of in vitro diagnostic tests for detection of SARS-CoV-2 virus and/or diagnosis of COVID-19 infection under section 564(b)(1) of the Act, 21 U.S.C. [id number]) (1), unless the authorization is terminated or revoked sooner. When diagnostic testing is negative, the possibility of a false negative result should be considered in the context of a patient's recent exposures and the presence of clinical signs and symptoms consistent with COVID-19. An individual  without symptoms of COVID- 19 and who is not shedding SARS-CoV-2 vi rus would expect to have a negative (not detected) result in this assay. Performed At: [facility] [place], [place] [id number] Rush Farmer MD [id number]    [place]  Final    Comment: Performed at [facility] [address], [place], [place] [address]    Procedures/Studies: Dg Abd 1 View  Result Date: 03/18/2019 CLINICAL DATA:  History of sigmoid diverticulitis with associated small abscess by CT scan 03/11/2019. Possible colon stricture. EXAM: ABDOMEN - 1 VIEW COMPARISON:  Chest and two views abdomen 03/15/2019. CT abdomen and pelvis 03/11/2019. FINDINGS: No evidence of free intraperitoneal air is seen on supine films. There is mild gaseous distention of the colon and scattered small bowel loops, improved since the comparison plain films. There is some gas in the rectum. No abnormal abdominal calcification. No acute bony abnormality. IMPRESSION: Mild gaseous distention of the colon appears improved compared to the most recent exam. Electronically Signed   By: Inge Rise M.D.   On: 03/18/2019 08:27   Ct Abdomen Pelvis W Contrast  Result Date: 03/11/2019 CLINICAL DATA:  85 year old female with acute abdominal and pelvic pain. EXAM: CT ABDOMEN AND PELVIS WITH CONTRAST TECHNIQUE: Multidetector CT imaging of the abdomen and pelvis was performed using the standard protocol following bolus administration of intravenous contrast. CONTRAST:  135mL OMNIPAQUE IOHEXOL 300 MG/ML  SOLN COMPARISON:  12/28/2018 FINDINGS: Lower chest: No acute abnormality. Hepatobiliary: The liver and gallbladder are unremarkable except for small hepatic cysts. No biliary dilatation. Pancreas: Unremarkable Spleen: Unremarkable Adrenals/Urinary Tract: The kidneys, adrenal glands and bladder are unremarkable except for stable small probable LEFT renal cyst. Stomach/Bowel: Wall  thickening of the proximal sigmoid colon is noted and appears slightly increased from 12/28/2018 and may represent increasing diverticulitis. A 3.5 cm intramural collection along the LEFT sigmoid colonic wall is again noted. Mild colonic distension proximal to the sigmoid colon is noted with fluid and gas. There is no evidence of small bowel obstruction. Vascular/Lymphatic: Aortic atherosclerosis. No enlarged abdominal or pelvic lymph nodes. Reproductive: Uterus and bilateral adnexa are unremarkable. Other: No ascites, pneumoperitoneum or new collection. Musculoskeletal: No acute or suspicious bony abnormalities. IMPRESSION: 1. Apparent increase in proximal sigmoid colon wall thickening which may represent flare up of acute diverticulitis  at this area. Unchanged 3.5 cm intramural sigmoid colonic wall abscess/collection, which has been present since 11/02/2018. Relative distension of the colon proximal to the sigmoid colon which may represent a low grade obstruction. No evidence of pneumoperitoneum. 2.  Aortic Atherosclerosis (ICD10-I70.0). Electronically Signed   By: Margarette Canada M.D.   On: 03/11/2019 21:15   Dg Abd Acute 2+v W 1v Chest  Result Date: 03/20/2019 CLINICAL DATA:  Abdominal pain in a patient with a history of chronic sigmoid diverticulitis and a small abscess on CT scan 03/11/2019. EXAM: DG ABDOMEN ACUTE W/ 1V CHEST COMPARISON:  CT abdomen and pelvis 03/11/2019. Chest and two views abdomen 03/15/2019 and two views of the abdomen 03/18/2019. FINDINGS: Single-view of the chest demonstrates clear lungs and normal heart size. Trace pleural effusions. No pneumothorax. Atherosclerosis noted. Two views of the abdomen show no free intraperitoneal air. Multiple gas-filled but nondilated loops of small and large bowel are present. No abnormal abdominal calcification or focal bony abnormality. IMPRESSION: Trace bilateral pleural effusions. Multiple gas-filled but nondilated loops of small and large bowel may be  due to ileus. Electronically Signed   By: Inge Rise M.D.   On: 03/20/2019 11:15   Dg Abd Acute W/chest  Result Date: 03/15/2019 CLINICAL DATA:  Left lower quadrant pain. EXAM: DG ABDOMEN ACUTE W/ 1V CHEST COMPARISON:  Abdominal CT March 11, 2019 FINDINGS: Sub pathologic gaseous distension of small bowel loops and colon which may represent a low-grade/intermediate obstruction at the level of the distal colon, or ileus. No evidence of free gas in the abdomen seen radiographically. IMPRESSION: Sub pathologic gaseous distension of small bowel loops and colon which may represent a low-grade/intermediate obstruction at the level of the distal colon, or ileus. Electronically Signed   By: Fidela Salisbury M.D.   On: 03/15/2019 18:37   Dg Abd Portable 1v  Result Date: 03/27/2019 CLINICAL DATA:  Nausea and vomiting. EXAM: PORTABLE ABDOMEN - 1 VIEW COMPARISON:  Abdominal x-ray dated March 21, 2019. FINDINGS: Stable to slightly worsened dilatation of air-filled small bowel. There is a small amount of air in the rectosigmoid colon, but the colon appears largely decompressed. No acute osseous abnormality. IMPRESSION: 1. Stable to slightly worsened small bowel ileus versus partial obstruction. Electronically Signed   By: Titus Dubin M.D.   On: 03/27/2019 12:51   Dg Abd Portable 1v  Result Date: 03/21/2019 CLINICAL DATA:  85 year old admitted 03/15/2019 for chronic recurrent sigmoid diverticulitis. Follow-up possible ileus. EXAM: PORTABLE ABDOMEN - 1 VIEW COMPARISON:  03/20/2019 and earlier, including CT abdomen and pelvis 03/11/2019. FINDINGS: Since yesterday's examination, many of the small bowel loops have now become moderately distended with gas. Gas is present within nondistended transverse colon. No suggestion of free air on the supine image. No visible opaque urinary tract calculi. IMPRESSION: Worsening of small bowel ileus versus developing partial small bowel obstruction. Electronically Signed    By: Evangeline Dakin M.D.   On: 03/21/2019 14:09   [place] Ekg Site Rite  Result Date: 03/21/2019 If Site Rite image not attached, placement could not be confirmed due to current cardiac rhythm.    Time coordinating discharge: Over 30 minutes  SIGNED:   Guilford Shi, MD  [facility] 04/02/2019, 11:48 AM Pager : [phone]

## 2019-04-02 NOTE — Plan of Care (Signed)
SW recently notified pt Covid -. Awaiting placement at [facility]. SW checking on status of bed.

## 2019-04-02 NOTE — Progress Notes (Signed)
[facility]/Trauma Progress Note  11 Days Post-Op   Assessment/Plan COPD (chronic obstructive pulmonary disease) (HCC) Diverticulitis of sigmoid colon Abnormal CT scan, gastrointestinal tract Colonic partial obstruction from sigmoid stricture Polyp of sigmoid colon Oral thrush  Partial colonic obstruction from sigmoid stricture presumed due to chronic recurrent diverticulitis -S/P laparoscopic and mobilization of the splenic flexure, laparotomy and sigmoid colectomy Jeanette Caprice procedure) with end colostomy, Dr. Hassell Done, 03/22/2019 -Pathology benign showed diverticulitis Postop ileus -resolved Severe malnutrition -Prealbumin8.1on09/14 -Add megace for poor appetite  "Bloody urine" - get u/a.  Likely local irritation from [place].  [id number] diet- patient doesn't like, but ok to choose anything.   VTE: SCD's, lovenox [id number] 09/02-09/10, Diflucan 09/05-09/12,WBC10.49/15, afebrile Foley:Removed POD 1 Follow up:Dr. Hassell Done  DISPO:repeat Covid negative; this is what was holding SNF discharge.  Hopefully this can happen now.     LOS: 18 days    Subjective: CC: No complaints  Main issue is just low oral intake.  No n/v.    Objective: Vital signs in last 24 hours: Temp:  [98 F (36.7 C)-98.2 F (36.8 C)] 98.1 F (36.7 C) (09/20 0514) Pulse Rate:  [67-75] 67 (09/20 0514) Resp:  [16-20] 16 (09/20 0514) BP: (107-126)/(62-65) 119/65 (09/20 0514) SpO2:  [96 %-100 %] 99 % (09/20 0514) Last BM Date: 04/01/19  Intake/Output from previous day: 09/19 0701 - 09/20 0700 In: 66 [P.O.:290; I.V.:230; IV Piggyback:50] Out: 950 [Urine:600; Stool:350] Intake/Output this shift: No intake/output data recorded.  PE: Gen: Alert, NAD, pleasant, cooperative Pulm: Rate and effort normal Abd: Soft,nodistention, midline with glueintact and iswithout signs of infection, LLQ colostomy, stoma is pink, stool, gas in bag.  Stool is firming up.   Skin: no rashes noted, warm and dry   Anti-infectives: Anti-infectives (From admission, onward)   Start     Dose/Rate Route Frequency Ordered Stop   03/22/19 2200  piperacillin-tazobactam (ZOSYN) IVPB 3.375 g     3.375 g 12.5 mL/hr over 240 Minutes Intravenous Every 8 hours 03/22/19 1725 03/23/19 0224   03/22/19 1115  cefoTEtan (CEFOTAN) 2 g in sodium chloride 0.9 % 100 mL IVPB     2 g 200 mL/hr over 30 Minutes Intravenous On call to O.R. 03/21/19 1829 03/22/19 1320   03/18/19 1900  fluconazole (DIFLUCAN) IVPB 100 mg     100 mg 50 mL/hr over 60 Minutes Intravenous Every 24 hours 03/18/19 1753 03/24/19 1918   03/16/19 0600  piperacillin-tazobactam (ZOSYN) IVPB 3.375 g  Status:  Discontinued     3.375 g 12.5 mL/hr over 240 Minutes Intravenous Every 8 hours 03/15/19 2316 03/22/19 1725   03/15/19 2315  piperacillin-tazobactam (ZOSYN) IVPB 3.375 g     3.375 g 100 mL/hr over 30 Minutes Intravenous NOW 03/15/19 2314 03/16/19 0038      Lab Results:  No results for input(s): WBC, HGB, HCT, PLT in the last 72 hours. BMET Recent Labs    03/31/19 0619 04/01/19 0421  NA 137 140  K 4.2 3.8  CL 105 108  CO2 24 25  GLUCOSE 121* 98  BUN 20 20  CREATININE 0.35* 0.41*  CALCIUM 8.3* 8.3*   PT/INR No results for input(s): LABPROT, INR in the last 72 hours. CMP     Component Value Date/Time   NA 140 04/01/2019 0421   K 3.8 04/01/2019 0421   CL 108 04/01/2019 0421   CO2 25 04/01/2019 0421   GLUCOSE 98 04/01/2019 0421   BUN 20 04/01/2019 0421   CREATININE 0.41 (L) 04/01/2019 0421  CALCIUM 8.3 (L) 04/01/2019 0421   PROT 5.8 (L) 03/31/2019 0619   ALBUMIN 2.1 (L) 03/31/2019 0619   AST 65 (H) 03/31/2019 0619   ALT 48 (H) 03/31/2019 0619   ALKPHOS 122 03/31/2019 0619   BILITOT 0.3 03/31/2019 0619   GFRNONAA >60 04/01/2019 0421   GFRAA >60 04/01/2019 0421   Lipase     Component Value Date/Time   LIPASE 11.0 11/02/2018 1103    Studies/Results: No results found.  Milus Height, MD FACS Surgical Oncology, General Surgery, Trauma and [facility], [place] [phone] Check [email or web address], password [facility] for coverage night/weekend

## 2019-04-02 NOTE — Progress Notes (Signed)
CSW received a call from pt's RN stating pt is ready for D/C.  Per pt's RN pt will need a 2nd COVID test if at [facility] after Mon 9/21 prior to D/C.  Per RN, pt to go to [facility] and this is reflected in notes (pt stated, per notes she accepts [employer]).  Per CSW notes from 9/18: "Patient and spouse aware there are no visitors allowed at this time, until after day 20 at SNF if she is stays that long. Patient spouse request non emergency ambulance transport. Spouse would like to be at the hospital when the patient is transported. (CSW will notify TOC weekend staff). ".  CSW called [facility] [phone]) and requested call back.  CSW will continue to follow for D/C needs.  Alphonse Guild. Danyela Posas, LCSW, LCAS, CSI Transitions of Care Clinical Social Worker Care Coordination Department Ph: [phone]

## 2019-04-03 ENCOUNTER — Telehealth: Payer: Self-pay | Admitting: *Deleted

## 2019-04-03 NOTE — Telephone Encounter (Signed)
Pt was on TCM report admitted 03/15/19 for evaluation ofrecurrentabdominal pain, poor oral intake, nausea and vomiting.Pt recently had a CT scan showing probable diverticulitis/intramural process/low-grade stricture for which she was recommended elective sigmoidectomy but she had declined surgical option. 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 Pt underwent laparoscopic mobilization of the splenic flexure, laparotomy and sigmoid colectomy with end colostomy. Pt D/C 04/02/19 top SNF. Per summary will need to follow=up w/PCP 1 week after leaving SNF.Marland KitchenJohny Chess

## 2019-04-04 DIAGNOSIS — K5649 Other impaction of intestine: Secondary | ICD-10-CM | POA: Diagnosis not present

## 2019-04-04 DIAGNOSIS — Z933 Colostomy status: Secondary | ICD-10-CM | POA: Diagnosis not present

## 2019-04-04 DIAGNOSIS — R066 Hiccough: Secondary | ICD-10-CM | POA: Diagnosis not present

## 2019-04-04 DIAGNOSIS — R6889 Other general symptoms and signs: Secondary | ICD-10-CM | POA: Diagnosis not present

## 2019-04-04 DIAGNOSIS — E440 Moderate protein-calorie malnutrition: Secondary | ICD-10-CM | POA: Diagnosis not present

## 2019-04-04 DIAGNOSIS — G9341 Metabolic encephalopathy: Secondary | ICD-10-CM | POA: Diagnosis not present

## 2019-04-04 DIAGNOSIS — K56699 Other intestinal obstruction unspecified as to partial versus complete obstruction: Secondary | ICD-10-CM | POA: Diagnosis not present

## 2019-04-04 DIAGNOSIS — J984 Other disorders of lung: Secondary | ICD-10-CM | POA: Diagnosis not present

## 2019-04-04 DIAGNOSIS — K56609 Unspecified intestinal obstruction, unspecified as to partial versus complete obstruction: Secondary | ICD-10-CM | POA: Diagnosis not present

## 2019-04-06 ENCOUNTER — Other Ambulatory Visit: Payer: Self-pay | Admitting: *Deleted

## 2019-04-06 NOTE — Patient Outreach (Signed)
Member assessed for potential [facility] Care Management needs as a benefit of  [place] Medicare.  Member is currently receiving rehab therapy at [facility].  Member discussed in weekly telephonic IDT meeting with facility staff, [facility] UM team, and writer.  Facility reports member is from home with husband. She was independent prior. Facility has scheduled care plan meeting with family today.   Will continue to follow for disposition plans, progression, and for potential [facility] Care Management needs.   Marthenia Rolling, MSN-Ed, RN,BSN [place] Acute Care Coordinator [phone] [facility]) [phone]  (Toll free office)

## 2019-04-11 DIAGNOSIS — Z933 Colostomy status: Secondary | ICD-10-CM | POA: Diagnosis not present

## 2019-04-11 DIAGNOSIS — E440 Moderate protein-calorie malnutrition: Secondary | ICD-10-CM | POA: Diagnosis not present

## 2019-04-11 DIAGNOSIS — R6889 Other general symptoms and signs: Secondary | ICD-10-CM | POA: Diagnosis not present

## 2019-04-11 DIAGNOSIS — Z433 Encounter for attention to colostomy: Secondary | ICD-10-CM | POA: Diagnosis not present

## 2019-04-13 DIAGNOSIS — R278 Other lack of coordination: Secondary | ICD-10-CM | POA: Diagnosis not present

## 2019-04-13 DIAGNOSIS — Z433 Encounter for attention to colostomy: Secondary | ICD-10-CM | POA: Diagnosis not present

## 2019-04-13 DIAGNOSIS — J449 Chronic obstructive pulmonary disease, unspecified: Secondary | ICD-10-CM | POA: Diagnosis not present

## 2019-04-13 DIAGNOSIS — J984 Other disorders of lung: Secondary | ICD-10-CM | POA: Diagnosis not present

## 2019-04-13 DIAGNOSIS — Z933 Colostomy status: Secondary | ICD-10-CM | POA: Diagnosis not present

## 2019-04-13 DIAGNOSIS — R2689 Other abnormalities of gait and mobility: Secondary | ICD-10-CM | POA: Diagnosis not present

## 2019-04-13 DIAGNOSIS — G9341 Metabolic encephalopathy: Secondary | ICD-10-CM | POA: Diagnosis not present

## 2019-04-13 DIAGNOSIS — K56699 Other intestinal obstruction unspecified as to partial versus complete obstruction: Secondary | ICD-10-CM | POA: Diagnosis not present

## 2019-04-13 DIAGNOSIS — E43 Unspecified severe protein-calorie malnutrition: Secondary | ICD-10-CM | POA: Diagnosis not present

## 2019-04-13 DIAGNOSIS — M6281 Muscle weakness (generalized): Secondary | ICD-10-CM | POA: Diagnosis not present

## 2019-04-13 DIAGNOSIS — E876 Hypokalemia: Secondary | ICD-10-CM | POA: Diagnosis not present

## 2019-04-13 DIAGNOSIS — Z48815 Encounter for surgical aftercare following surgery on the digestive system: Secondary | ICD-10-CM | POA: Diagnosis not present

## 2019-04-13 DIAGNOSIS — R2681 Unsteadiness on feet: Secondary | ICD-10-CM | POA: Diagnosis not present

## 2019-04-13 DIAGNOSIS — K567 Ileus, unspecified: Secondary | ICD-10-CM | POA: Diagnosis not present

## 2019-04-13 DIAGNOSIS — R41841 Cognitive communication deficit: Secondary | ICD-10-CM | POA: Diagnosis not present

## 2019-04-13 DIAGNOSIS — K5649 Other impaction of intestine: Secondary | ICD-10-CM | POA: Diagnosis not present

## 2019-04-13 DIAGNOSIS — U071 COVID-19: Secondary | ICD-10-CM | POA: Diagnosis not present

## 2019-04-18 DIAGNOSIS — E876 Hypokalemia: Secondary | ICD-10-CM | POA: Diagnosis not present

## 2019-04-18 DIAGNOSIS — Z433 Encounter for attention to colostomy: Secondary | ICD-10-CM | POA: Diagnosis not present

## 2019-04-18 DIAGNOSIS — G9341 Metabolic encephalopathy: Secondary | ICD-10-CM | POA: Diagnosis not present

## 2019-04-18 DIAGNOSIS — K5649 Other impaction of intestine: Secondary | ICD-10-CM | POA: Diagnosis not present

## 2019-04-19 DIAGNOSIS — K567 Ileus, unspecified: Secondary | ICD-10-CM | POA: Diagnosis not present

## 2019-04-19 DIAGNOSIS — Z933 Colostomy status: Secondary | ICD-10-CM | POA: Diagnosis not present

## 2019-04-19 DIAGNOSIS — Z433 Encounter for attention to colostomy: Secondary | ICD-10-CM | POA: Diagnosis not present

## 2019-04-19 DIAGNOSIS — E876 Hypokalemia: Secondary | ICD-10-CM | POA: Diagnosis not present

## 2019-04-21 DIAGNOSIS — G9341 Metabolic encephalopathy: Secondary | ICD-10-CM | POA: Diagnosis not present

## 2019-04-21 DIAGNOSIS — E876 Hypokalemia: Secondary | ICD-10-CM | POA: Diagnosis not present

## 2019-04-21 DIAGNOSIS — J984 Other disorders of lung: Secondary | ICD-10-CM | POA: Diagnosis not present

## 2019-04-21 DIAGNOSIS — Z933 Colostomy status: Secondary | ICD-10-CM | POA: Diagnosis not present

## 2019-04-21 DIAGNOSIS — Z433 Encounter for attention to colostomy: Secondary | ICD-10-CM | POA: Diagnosis not present

## 2019-04-22 ENCOUNTER — Telehealth: Payer: Self-pay

## 2019-04-22 DIAGNOSIS — Z48815 Encounter for surgical aftercare following surgery on the digestive system: Secondary | ICD-10-CM | POA: Diagnosis not present

## 2019-04-22 DIAGNOSIS — Z433 Encounter for attention to colostomy: Secondary | ICD-10-CM | POA: Diagnosis not present

## 2019-04-22 DIAGNOSIS — J449 Chronic obstructive pulmonary disease, unspecified: Secondary | ICD-10-CM | POA: Diagnosis not present

## 2019-04-22 DIAGNOSIS — E43 Unspecified severe protein-calorie malnutrition: Secondary | ICD-10-CM | POA: Diagnosis not present

## 2019-04-22 DIAGNOSIS — Z87891 Personal history of nicotine dependence: Secondary | ICD-10-CM | POA: Diagnosis not present

## 2019-04-22 DIAGNOSIS — E785 Hyperlipidemia, unspecified: Secondary | ICD-10-CM | POA: Diagnosis not present

## 2019-04-22 DIAGNOSIS — K5732 Diverticulitis of large intestine without perforation or abscess without bleeding: Secondary | ICD-10-CM | POA: Diagnosis not present

## 2019-04-22 DIAGNOSIS — M4722 Other spondylosis with radiculopathy, cervical region: Secondary | ICD-10-CM | POA: Diagnosis not present

## 2019-04-22 DIAGNOSIS — R634 Abnormal weight loss: Secondary | ICD-10-CM | POA: Diagnosis not present

## 2019-04-22 MED ORDER — OXYCODONE HCL 5 MG PO TABS: 5.0000 mg | ORAL_TABLET | Freq: Four times a day (QID) | ORAL | 0 refills | Status: DC | PRN

## 2019-04-22 NOTE — Telephone Encounter (Signed)
The liquid K is VERY bitter and VERY expensive - Is this really needed? Since she can take other po meds...  [place] for oxycodone refill - done erx

## 2019-04-22 NOTE — Telephone Encounter (Signed)
Juliann Pulse with [facility], per team health, did not leave name of agency.... called as she completed initial visit for home PT and OT, pt c/o potassium pills being to large to swallow, would like liquid to be sent in if possible.... also requesting refill of Oxycodone 5 mg that was prescribed at discharge...Marland Kitchen please advise

## 2019-04-22 NOTE — Telephone Encounter (Signed)
Spoke to [place] and she states pt is taking Tylenol 650mg  Q6H PRN, Potassium 65meq QD and pt needs liquid also wanting refill on Oxycodone 5mg  BID.... aware that this will need to be deferred to PCP for controlled refill per Dr Damita Dunnings

## 2019-04-22 NOTE — Telephone Encounter (Signed)
I don't see her current potassium dose or oxycodone listed on her meds.   I need more information on both meds.  And we don't refill controlled meds after hours.   Please see what details you can get.  Thanks.

## 2019-04-24 ENCOUNTER — Telehealth: Payer: Self-pay | Admitting: *Deleted

## 2019-04-24 DIAGNOSIS — Z48815 Encounter for surgical aftercare following surgery on the digestive system: Secondary | ICD-10-CM | POA: Diagnosis not present

## 2019-04-24 DIAGNOSIS — J449 Chronic obstructive pulmonary disease, unspecified: Secondary | ICD-10-CM | POA: Diagnosis not present

## 2019-04-24 DIAGNOSIS — E43 Unspecified severe protein-calorie malnutrition: Secondary | ICD-10-CM | POA: Diagnosis not present

## 2019-04-24 DIAGNOSIS — R634 Abnormal weight loss: Secondary | ICD-10-CM | POA: Diagnosis not present

## 2019-04-24 DIAGNOSIS — K5732 Diverticulitis of large intestine without perforation or abscess without bleeding: Secondary | ICD-10-CM | POA: Diagnosis not present

## 2019-04-24 DIAGNOSIS — Z433 Encounter for attention to colostomy: Secondary | ICD-10-CM | POA: Diagnosis not present

## 2019-04-24 NOTE — Telephone Encounter (Signed)
Called pt to verify  hosp f/u appt that's made for 05/05/19. Pt states she is aware of appt. Completed TCM CALL BELOW.Marland KitchenLMB   Transition Care Management Follow-up Telephone Call   Date discharged? 04/21/19 FROM SNF   How have you been since you were released from the hospital? Pt states she is doing fine   Do you understand why you were in the hospital? YES   Do you understand the discharge instructions? YES   Where were you discharged to? HOME   Items Reviewed:  Medications reviewed: YES, per pt there was no changes  Allergies reviewed: YES  Dietary changes reviewed: YES, heart healthy  Referrals reviewed: No referral recommeded   Functional Questionnaire:   Activities of Daily Living (ADLs):   She states she are independent in the following: bathing and hygiene, feeding, continence, grooming, toileting and dressing States she require assistance with the following: ambulation   Any transportation issues/concerns?: NO   Any patient concerns? NO   Confirmed importance and date/time of follow-up visits scheduled YES, appt 05/05/19  Provider Appointment booked with Dr. Jenny Reichmann  Confirmed with patient if condition begins to worsen call PCP or go to the ER.  Patient was given the office number and encouraged to call back with question or concerns.  : YES

## 2019-04-24 NOTE — Telephone Encounter (Signed)
Pt stated she doesn't care about pricing. She would like the liquid sent to the pharmacy.

## 2019-04-24 NOTE — Telephone Encounter (Signed)
Rec'd msg off Patient Ping Portal pt was admitted to [facility] skilled from [facility] 04/02/19 @ 6:01 PM. Pt Discharged from [facility] to home on 04/21/19 [facility] 04/02/19 12:10 PM. Will call pt to follow-up and make appt if need.Marland KitchenJohny Chess

## 2019-04-24 NOTE — Telephone Encounter (Signed)
I have reviewed her med list and it does not appear she needs any potassium

## 2019-04-25 DIAGNOSIS — R634 Abnormal weight loss: Secondary | ICD-10-CM | POA: Diagnosis not present

## 2019-04-25 DIAGNOSIS — Z433 Encounter for attention to colostomy: Secondary | ICD-10-CM | POA: Diagnosis not present

## 2019-04-25 DIAGNOSIS — J449 Chronic obstructive pulmonary disease, unspecified: Secondary | ICD-10-CM | POA: Diagnosis not present

## 2019-04-25 DIAGNOSIS — E43 Unspecified severe protein-calorie malnutrition: Secondary | ICD-10-CM | POA: Diagnosis not present

## 2019-04-25 DIAGNOSIS — K5732 Diverticulitis of large intestine without perforation or abscess without bleeding: Secondary | ICD-10-CM | POA: Diagnosis not present

## 2019-04-25 DIAGNOSIS — Z48815 Encounter for surgical aftercare following surgery on the digestive system: Secondary | ICD-10-CM | POA: Diagnosis not present

## 2019-04-25 NOTE — Telephone Encounter (Signed)
Called pt, LVM.   

## 2019-04-26 ENCOUNTER — Telehealth: Payer: Self-pay | Admitting: Internal Medicine

## 2019-04-26 DIAGNOSIS — K5732 Diverticulitis of large intestine without perforation or abscess without bleeding: Secondary | ICD-10-CM | POA: Diagnosis not present

## 2019-04-26 DIAGNOSIS — Z48815 Encounter for surgical aftercare following surgery on the digestive system: Secondary | ICD-10-CM | POA: Diagnosis not present

## 2019-04-26 DIAGNOSIS — Z433 Encounter for attention to colostomy: Secondary | ICD-10-CM | POA: Diagnosis not present

## 2019-04-26 DIAGNOSIS — J449 Chronic obstructive pulmonary disease, unspecified: Secondary | ICD-10-CM | POA: Diagnosis not present

## 2019-04-26 DIAGNOSIS — R634 Abnormal weight loss: Secondary | ICD-10-CM | POA: Diagnosis not present

## 2019-04-26 DIAGNOSIS — E43 Unspecified severe protein-calorie malnutrition: Secondary | ICD-10-CM | POA: Diagnosis not present

## 2019-04-26 NOTE — Telephone Encounter (Signed)
Verbal orders given  

## 2019-04-26 NOTE — Telephone Encounter (Signed)
Home Health Verbal Orders - Caller/Agency: [facility]/ [facility]  Callback Number: [phone] secure vm can be left  Requesting PT Frequency: 1x a week for 3 weeks   Pt has declined having OT eval and social worker but Pt is requesting Home Health aid for 1x this week and  2x's a week for 2 weeks for assist with bathing

## 2019-04-27 DIAGNOSIS — K5732 Diverticulitis of large intestine without perforation or abscess without bleeding: Secondary | ICD-10-CM | POA: Diagnosis not present

## 2019-04-27 DIAGNOSIS — E43 Unspecified severe protein-calorie malnutrition: Secondary | ICD-10-CM | POA: Diagnosis not present

## 2019-04-27 DIAGNOSIS — R634 Abnormal weight loss: Secondary | ICD-10-CM | POA: Diagnosis not present

## 2019-04-27 DIAGNOSIS — Z433 Encounter for attention to colostomy: Secondary | ICD-10-CM | POA: Diagnosis not present

## 2019-04-27 DIAGNOSIS — J449 Chronic obstructive pulmonary disease, unspecified: Secondary | ICD-10-CM | POA: Diagnosis not present

## 2019-04-27 DIAGNOSIS — Z48815 Encounter for surgical aftercare following surgery on the digestive system: Secondary | ICD-10-CM | POA: Diagnosis not present

## 2019-05-02 DIAGNOSIS — J449 Chronic obstructive pulmonary disease, unspecified: Secondary | ICD-10-CM | POA: Diagnosis not present

## 2019-05-02 DIAGNOSIS — K5732 Diverticulitis of large intestine without perforation or abscess without bleeding: Secondary | ICD-10-CM | POA: Diagnosis not present

## 2019-05-02 DIAGNOSIS — R634 Abnormal weight loss: Secondary | ICD-10-CM | POA: Diagnosis not present

## 2019-05-02 DIAGNOSIS — Z48815 Encounter for surgical aftercare following surgery on the digestive system: Secondary | ICD-10-CM | POA: Diagnosis not present

## 2019-05-02 DIAGNOSIS — Z433 Encounter for attention to colostomy: Secondary | ICD-10-CM | POA: Diagnosis not present

## 2019-05-02 DIAGNOSIS — E43 Unspecified severe protein-calorie malnutrition: Secondary | ICD-10-CM | POA: Diagnosis not present

## 2019-05-03 DIAGNOSIS — K5732 Diverticulitis of large intestine without perforation or abscess without bleeding: Secondary | ICD-10-CM | POA: Diagnosis not present

## 2019-05-03 DIAGNOSIS — E43 Unspecified severe protein-calorie malnutrition: Secondary | ICD-10-CM | POA: Diagnosis not present

## 2019-05-03 DIAGNOSIS — R634 Abnormal weight loss: Secondary | ICD-10-CM | POA: Diagnosis not present

## 2019-05-03 DIAGNOSIS — J449 Chronic obstructive pulmonary disease, unspecified: Secondary | ICD-10-CM | POA: Diagnosis not present

## 2019-05-03 DIAGNOSIS — Z433 Encounter for attention to colostomy: Secondary | ICD-10-CM | POA: Diagnosis not present

## 2019-05-03 DIAGNOSIS — Z48815 Encounter for surgical aftercare following surgery on the digestive system: Secondary | ICD-10-CM | POA: Diagnosis not present

## 2019-05-04 DIAGNOSIS — Z433 Encounter for attention to colostomy: Secondary | ICD-10-CM | POA: Diagnosis not present

## 2019-05-04 DIAGNOSIS — J449 Chronic obstructive pulmonary disease, unspecified: Secondary | ICD-10-CM | POA: Diagnosis not present

## 2019-05-04 DIAGNOSIS — K5732 Diverticulitis of large intestine without perforation or abscess without bleeding: Secondary | ICD-10-CM | POA: Diagnosis not present

## 2019-05-04 DIAGNOSIS — E43 Unspecified severe protein-calorie malnutrition: Secondary | ICD-10-CM | POA: Diagnosis not present

## 2019-05-04 DIAGNOSIS — Z48815 Encounter for surgical aftercare following surgery on the digestive system: Secondary | ICD-10-CM | POA: Diagnosis not present

## 2019-05-04 DIAGNOSIS — R634 Abnormal weight loss: Secondary | ICD-10-CM | POA: Diagnosis not present

## 2019-05-05 ENCOUNTER — Inpatient Hospital Stay: Payer: Medicare Other | Admitting: Internal Medicine

## 2019-05-08 DIAGNOSIS — Z48815 Encounter for surgical aftercare following surgery on the digestive system: Secondary | ICD-10-CM | POA: Diagnosis not present

## 2019-05-08 DIAGNOSIS — J449 Chronic obstructive pulmonary disease, unspecified: Secondary | ICD-10-CM | POA: Diagnosis not present

## 2019-05-08 DIAGNOSIS — Z433 Encounter for attention to colostomy: Secondary | ICD-10-CM | POA: Diagnosis not present

## 2019-05-08 DIAGNOSIS — R634 Abnormal weight loss: Secondary | ICD-10-CM | POA: Diagnosis not present

## 2019-05-08 DIAGNOSIS — K5732 Diverticulitis of large intestine without perforation or abscess without bleeding: Secondary | ICD-10-CM | POA: Diagnosis not present

## 2019-05-08 DIAGNOSIS — E43 Unspecified severe protein-calorie malnutrition: Secondary | ICD-10-CM | POA: Diagnosis not present

## 2019-05-10 DIAGNOSIS — E43 Unspecified severe protein-calorie malnutrition: Secondary | ICD-10-CM | POA: Diagnosis not present

## 2019-05-10 DIAGNOSIS — R634 Abnormal weight loss: Secondary | ICD-10-CM | POA: Diagnosis not present

## 2019-05-10 DIAGNOSIS — Z48815 Encounter for surgical aftercare following surgery on the digestive system: Secondary | ICD-10-CM | POA: Diagnosis not present

## 2019-05-10 DIAGNOSIS — K5732 Diverticulitis of large intestine without perforation or abscess without bleeding: Secondary | ICD-10-CM | POA: Diagnosis not present

## 2019-05-10 DIAGNOSIS — Z433 Encounter for attention to colostomy: Secondary | ICD-10-CM | POA: Diagnosis not present

## 2019-05-10 DIAGNOSIS — J449 Chronic obstructive pulmonary disease, unspecified: Secondary | ICD-10-CM | POA: Diagnosis not present

## 2019-05-11 ENCOUNTER — Telehealth: Payer: Self-pay

## 2019-05-11 DIAGNOSIS — Z48815 Encounter for surgical aftercare following surgery on the digestive system: Secondary | ICD-10-CM | POA: Diagnosis not present

## 2019-05-11 DIAGNOSIS — Z433 Encounter for attention to colostomy: Secondary | ICD-10-CM | POA: Diagnosis not present

## 2019-05-11 DIAGNOSIS — K5732 Diverticulitis of large intestine without perforation or abscess without bleeding: Secondary | ICD-10-CM | POA: Diagnosis not present

## 2019-05-11 DIAGNOSIS — R634 Abnormal weight loss: Secondary | ICD-10-CM | POA: Diagnosis not present

## 2019-05-11 DIAGNOSIS — J449 Chronic obstructive pulmonary disease, unspecified: Secondary | ICD-10-CM | POA: Diagnosis not present

## 2019-05-11 DIAGNOSIS — E43 Unspecified severe protein-calorie malnutrition: Secondary | ICD-10-CM | POA: Diagnosis not present

## 2019-05-11 NOTE — Telephone Encounter (Signed)
Noted  Copied from [place] [id number]. Topic: General - Inquiry >> May 11, 2019 12:30 PM Virl Axe D wrote: Reason for CRM: Maudie Mercury, RN with [facility] home health stated they are adding wound care for pt. Requesting CB if there are any questions. Please advise.

## 2019-05-12 ENCOUNTER — Telehealth: Payer: Self-pay | Admitting: Internal Medicine

## 2019-05-12 NOTE — Telephone Encounter (Signed)
Theresa Arnold with [facility] called and said that he has been exposed to someone with covid and got tested and he is waiting for test results to come back. He would like to extend patient order to next week. If any question he can be reached at [phone].

## 2019-05-12 NOTE — Telephone Encounter (Signed)
Called Theresa Arnold LVM

## 2019-05-12 NOTE — Telephone Encounter (Signed)
Ok for verbal 

## 2019-05-16 DIAGNOSIS — J449 Chronic obstructive pulmonary disease, unspecified: Secondary | ICD-10-CM | POA: Diagnosis not present

## 2019-05-16 DIAGNOSIS — Z48815 Encounter for surgical aftercare following surgery on the digestive system: Secondary | ICD-10-CM | POA: Diagnosis not present

## 2019-05-16 DIAGNOSIS — K5732 Diverticulitis of large intestine without perforation or abscess without bleeding: Secondary | ICD-10-CM | POA: Diagnosis not present

## 2019-05-16 DIAGNOSIS — R634 Abnormal weight loss: Secondary | ICD-10-CM | POA: Diagnosis not present

## 2019-05-16 DIAGNOSIS — E43 Unspecified severe protein-calorie malnutrition: Secondary | ICD-10-CM | POA: Diagnosis not present

## 2019-05-16 DIAGNOSIS — Z433 Encounter for attention to colostomy: Secondary | ICD-10-CM | POA: Diagnosis not present

## 2019-05-19 DIAGNOSIS — E43 Unspecified severe protein-calorie malnutrition: Secondary | ICD-10-CM | POA: Diagnosis not present

## 2019-05-19 DIAGNOSIS — Z433 Encounter for attention to colostomy: Secondary | ICD-10-CM | POA: Diagnosis not present

## 2019-05-19 DIAGNOSIS — R634 Abnormal weight loss: Secondary | ICD-10-CM | POA: Diagnosis not present

## 2019-05-19 DIAGNOSIS — J449 Chronic obstructive pulmonary disease, unspecified: Secondary | ICD-10-CM | POA: Diagnosis not present

## 2019-05-19 DIAGNOSIS — Z48815 Encounter for surgical aftercare following surgery on the digestive system: Secondary | ICD-10-CM | POA: Diagnosis not present

## 2019-05-19 DIAGNOSIS — K5732 Diverticulitis of large intestine without perforation or abscess without bleeding: Secondary | ICD-10-CM | POA: Diagnosis not present

## 2019-05-22 DIAGNOSIS — K5732 Diverticulitis of large intestine without perforation or abscess without bleeding: Secondary | ICD-10-CM | POA: Diagnosis not present

## 2019-05-22 DIAGNOSIS — E785 Hyperlipidemia, unspecified: Secondary | ICD-10-CM | POA: Diagnosis not present

## 2019-05-22 DIAGNOSIS — Z433 Encounter for attention to colostomy: Secondary | ICD-10-CM | POA: Diagnosis not present

## 2019-05-22 DIAGNOSIS — Z48815 Encounter for surgical aftercare following surgery on the digestive system: Secondary | ICD-10-CM | POA: Diagnosis not present

## 2019-05-22 DIAGNOSIS — M4722 Other spondylosis with radiculopathy, cervical region: Secondary | ICD-10-CM | POA: Diagnosis not present

## 2019-05-22 DIAGNOSIS — Z87891 Personal history of nicotine dependence: Secondary | ICD-10-CM | POA: Diagnosis not present

## 2019-05-22 DIAGNOSIS — R634 Abnormal weight loss: Secondary | ICD-10-CM | POA: Diagnosis not present

## 2019-05-22 DIAGNOSIS — J449 Chronic obstructive pulmonary disease, unspecified: Secondary | ICD-10-CM | POA: Diagnosis not present

## 2019-05-22 DIAGNOSIS — E43 Unspecified severe protein-calorie malnutrition: Secondary | ICD-10-CM | POA: Diagnosis not present

## 2019-05-22 NOTE — Telephone Encounter (Signed)
Theresa Arnold called stating he did not receive a call or vm for some reason. He Is requesting a call back. Verified phone # he said it was correct. Please advise.

## 2019-05-22 NOTE — Telephone Encounter (Signed)
Called David back, verbal orders given.

## 2019-05-24 DIAGNOSIS — K5732 Diverticulitis of large intestine without perforation or abscess without bleeding: Secondary | ICD-10-CM | POA: Diagnosis not present

## 2019-05-24 DIAGNOSIS — R634 Abnormal weight loss: Secondary | ICD-10-CM | POA: Diagnosis not present

## 2019-05-24 DIAGNOSIS — Z433 Encounter for attention to colostomy: Secondary | ICD-10-CM | POA: Diagnosis not present

## 2019-05-24 DIAGNOSIS — Z48815 Encounter for surgical aftercare following surgery on the digestive system: Secondary | ICD-10-CM | POA: Diagnosis not present

## 2019-05-24 DIAGNOSIS — E43 Unspecified severe protein-calorie malnutrition: Secondary | ICD-10-CM | POA: Diagnosis not present

## 2019-05-24 DIAGNOSIS — J449 Chronic obstructive pulmonary disease, unspecified: Secondary | ICD-10-CM | POA: Diagnosis not present

## 2019-05-25 ENCOUNTER — Telehealth: Payer: Self-pay | Admitting: Internal Medicine

## 2019-05-25 DIAGNOSIS — K5732 Diverticulitis of large intestine without perforation or abscess without bleeding: Secondary | ICD-10-CM | POA: Diagnosis not present

## 2019-05-25 DIAGNOSIS — Z48815 Encounter for surgical aftercare following surgery on the digestive system: Secondary | ICD-10-CM | POA: Diagnosis not present

## 2019-05-25 DIAGNOSIS — R634 Abnormal weight loss: Secondary | ICD-10-CM | POA: Diagnosis not present

## 2019-05-25 DIAGNOSIS — Z433 Encounter for attention to colostomy: Secondary | ICD-10-CM | POA: Diagnosis not present

## 2019-05-25 DIAGNOSIS — E43 Unspecified severe protein-calorie malnutrition: Secondary | ICD-10-CM | POA: Diagnosis not present

## 2019-05-25 DIAGNOSIS — J449 Chronic obstructive pulmonary disease, unspecified: Secondary | ICD-10-CM | POA: Diagnosis not present

## 2019-05-25 MED ORDER — OXYCODONE HCL 5 MG PO TABS: 5.0000 mg | ORAL_TABLET | Freq: Four times a day (QID) | ORAL | 0 refills | Status: DC | PRN

## 2019-05-25 NOTE — Telephone Encounter (Signed)
rxrefill oxyCODONE (ROXICODONE) 5 MG immediate release tablet  PHARMACY [id number] - [place], [place] [address]

## 2019-05-25 NOTE — Telephone Encounter (Signed)
Done erx 

## 2019-06-01 DIAGNOSIS — K5732 Diverticulitis of large intestine without perforation or abscess without bleeding: Secondary | ICD-10-CM | POA: Diagnosis not present

## 2019-06-01 DIAGNOSIS — R634 Abnormal weight loss: Secondary | ICD-10-CM | POA: Diagnosis not present

## 2019-06-01 DIAGNOSIS — J449 Chronic obstructive pulmonary disease, unspecified: Secondary | ICD-10-CM | POA: Diagnosis not present

## 2019-06-01 DIAGNOSIS — Z433 Encounter for attention to colostomy: Secondary | ICD-10-CM | POA: Diagnosis not present

## 2019-06-01 DIAGNOSIS — Z48815 Encounter for surgical aftercare following surgery on the digestive system: Secondary | ICD-10-CM | POA: Diagnosis not present

## 2019-06-01 DIAGNOSIS — E43 Unspecified severe protein-calorie malnutrition: Secondary | ICD-10-CM | POA: Diagnosis not present

## 2019-06-05 ENCOUNTER — Encounter: Payer: Self-pay | Admitting: Internal Medicine

## 2019-06-05 ENCOUNTER — Ambulatory Visit (INDEPENDENT_AMBULATORY_CARE_PROVIDER_SITE_OTHER): Payer: Medicare Other | Admitting: Internal Medicine

## 2019-06-05 ENCOUNTER — Other Ambulatory Visit: Payer: Self-pay

## 2019-06-05 VITALS — BP 124/74 | HR 68 | Temp 98.1°F | Ht 62.0 in | Wt 108.0 lb

## 2019-06-05 DIAGNOSIS — R7989 Other specified abnormal findings of blood chemistry: Secondary | ICD-10-CM

## 2019-06-05 DIAGNOSIS — R945 Abnormal results of liver function studies: Secondary | ICD-10-CM

## 2019-06-05 DIAGNOSIS — K56609 Unspecified intestinal obstruction, unspecified as to partial versus complete obstruction: Secondary | ICD-10-CM

## 2019-06-05 DIAGNOSIS — D62 Acute posthemorrhagic anemia: Secondary | ICD-10-CM | POA: Diagnosis not present

## 2019-06-05 NOTE — Progress Notes (Signed)
Subjective:    Patient ID: Theresa Arnold, female    DOB: Sep 01, 1931, 85 y.o.   MRN: [id number]  HPI  Here to f/u with family assist, since last seen has hx of diverticulitis now s/p ostomy, and noted mild increased lfts and ABL anemia as well.  Has persistent sensitivity/pain to left abd but may be improving. Denies worsening reflux, abd pain, dysphagia, n/v, bowel change or blood.  Pt denies chest pain, increased sob or doe, wheezing, orthopnea, PND, increased LE swelling, palpitations, dizziness or syncope.  Pt denies new neurological symptoms such as new headache, or facial or extremity weakness or numbness   Pt denies polydipsia, polyuria Past Medical History:  Diagnosis Date  . CERVICAL RADICULOPATHY, LEFT 09/03/2010  . Cervical spondylosis 11/10/2011  . COPD (chronic obstructive pulmonary disease) ([place]) 11/10/2011  . HYPERLIPIDEMIA 09/03/2010   Past Surgical History:  Procedure Laterality Date  . COLONOSCOPY    . LAPAROSCOPIC PARTIAL COLECTOMY N/A 03/22/2019   Procedure: LAPAROSCOPY, SPLENIC FLEXURE MOBILIZATION WITH OPEN HARTMANN PROCEDURE;  Surgeon: [name], MD;  Location: [facility];  Service: General;  Laterality: N/A;    reports that she quit smoking about 7 years ago. Her smoking use included cigarettes. She has a 30.00 pack-year smoking history. She has never used smokeless tobacco. She reports current alcohol use. She reports that she does not use drugs. family history includes Breast cancer in her sister; Colon cancer in her sister; Heart disease in her father; Hip fracture in her mother; Hypertension in an other family member. No Known Allergies Current Outpatient Medications on File Prior to Visit  Medication Sig Dispense Refill  . alum & mag hydroxide-simeth (MAALOX/MYLANTA) 200-200-20 MG/5ML suspension Take 30 mLs by mouth every 6 (six) hours as needed for indigestion or heartburn (or bloating). 355 mL 0  . chlorproMAZINE (THORAZINE) 10 MG tablet Take 1 tablet (10 mg  total) by mouth 3 (three) times daily as needed for hiccoughs.    Marland Kitchen ipratropium-albuterol (DUONEB) 0.5-2.5 (3) MG/3ML SOLN Take 3 mLs by nebulization every 4 (four) hours as needed. 360 mL   . magic mouthwash SOLN Take 15 mLs by mouth 4 (four) times daily as needed for mouth pain (sore throat). 100 mL 0  . ondansetron (ZOFRAN) 4 MG tablet Take 1 tablet (4 mg total) by mouth every 8 (eight) hours as needed for nausea or vomiting. 30 tablet 0  . oxyCODONE (ROXICODONE) 5 MG immediate release tablet Take 1 tablet (5 mg total) by mouth every 6 (six) hours as needed for severe pain. 60 tablet 0  . polyethylene glycol (MIRALAX / GLYCOLAX) 17 g packet Take 17 g by mouth daily as needed for mild constipation.     No current facility-administered medications on file prior to visit.    Review of Systems  Constitutional: Negative for other unusual diaphoresis or sweats HENT: Negative for ear discharge or swelling Eyes: Negative for other worsening visual disturbances Respiratory: Negative for stridor or other swelling  Gastrointestinal: Negative for worsening distension or other blood Genitourinary: Negative for retention or other urinary change Musculoskeletal: Negative for other MSK pain or swelling Skin: Negative for color change or other new lesions Neurological: Negative for worsening tremors and other numbness  Psychiatric/Behavioral: Negative for worsening agitation or other fatigue All otherwise neg per pt     Objective:   Physical Exam BP 124/74   Pulse 68   Temp 98.1 F (36.7 C) (Oral)   Ht 5\' 2"  (1.575 m)   Wt 108 lb (  49 kg)   SpO2 97%   BMI 19.75 kg/m  VS noted,  Constitutional: Pt appears in NAD HENT: Head: NCAT.  Right Ear: External ear normal.  Left Ear: External ear normal.  Eyes: . Pupils are equal, round, and reactive to light. Conjunctivae and EOM are normal Nose: without d/c or deformity Neck: Neck supple. Gross normal ROM Cardiovascular: Normal rate and regular  rhythm.   Pulmonary/Chest: Effort normal and breath sounds without rales or wheezing.  Abd:  Soft, ND, + BS, no organomegaly with mild tender periostomy site without red, swelling or drainage Neurological: Pt is alert. At baseline orientation, motor grossly intact Skin: Skin is warm. No rashes, other new lesions, no LE edema Psychiatric: Pt behavior is normal without agitation  All otherwise neg per pt   Lab Results  Component Value Date   WBC 10.4 03/28/2019   HGB 8.9 (L) 03/28/2019   HCT 27.9 (L) 03/28/2019   PLT 198 03/28/2019   GLUCOSE 98 04/01/2019   CHOL 209 (H) 11/24/2017   TRIG 65 03/27/2019   HDL 76.30 11/24/2017   LDLDIRECT 142.7 11/10/2011   LDLCALC 118 (H) 11/24/2017   ALT 48 (H) 03/31/2019   AST 65 (H) 03/31/2019   NA 140 04/01/2019   K 3.8 04/01/2019   CL 108 04/01/2019   CREATININE 0.41 (L) 04/01/2019   BUN 20 04/01/2019   CO2 25 04/01/2019   TSH 0.75 11/24/2017   INR 0.97 04/18/2018      Assessment & Plan:

## 2019-06-05 NOTE — Patient Instructions (Signed)
Please continue all other medications as before, and remember we will need to consider weaning off the oxycodone pain medication starting next month as you should not need this long term  Please have the pharmacy call with any other refills you may need.  Please continue your efforts at being more active, low cholesterol diet, and weight control.  Please keep your appointments with your specialists as you may have planned - General Surgury in Dec 2020 as planned

## 2019-06-11 ENCOUNTER — Encounter: Payer: Self-pay | Admitting: Internal Medicine

## 2019-06-11 DIAGNOSIS — R7989 Other specified abnormal findings of blood chemistry: Secondary | ICD-10-CM | POA: Insufficient documentation

## 2019-06-11 DIAGNOSIS — D62 Acute posthemorrhagic anemia: Secondary | ICD-10-CM | POA: Insufficient documentation

## 2019-06-11 DIAGNOSIS — R945 Abnormal results of liver function studies: Secondary | ICD-10-CM | POA: Insufficient documentation

## 2019-06-11 NOTE — Assessment & Plan Note (Signed)
Declines f/u lfts

## 2019-06-11 NOTE — Assessment & Plan Note (Addendum)
S/p ostomy, now for plan to wean oxycodone next month, f/u surgury dec 9 as planned, and note pt declines further labs today such as cbc or f/u CT  Note:  Total time for pt hx, exam, review of record with pt in the room, determination of diagnoses and plan for further eval and tx is > 40 min, with over 50% spent in coordination and counseling of patient including the differential dx, tx, further evaluation and other management of colon stricture, abnormal lfts, abl anemia

## 2019-06-11 NOTE — Assessment & Plan Note (Signed)
Declines f/u lab today,  to f/u any worsening symptoms or concerns  

## 2019-06-12 DIAGNOSIS — R634 Abnormal weight loss: Secondary | ICD-10-CM | POA: Diagnosis not present

## 2019-06-12 DIAGNOSIS — J449 Chronic obstructive pulmonary disease, unspecified: Secondary | ICD-10-CM | POA: Diagnosis not present

## 2019-06-12 DIAGNOSIS — E43 Unspecified severe protein-calorie malnutrition: Secondary | ICD-10-CM | POA: Diagnosis not present

## 2019-06-12 DIAGNOSIS — Z433 Encounter for attention to colostomy: Secondary | ICD-10-CM | POA: Diagnosis not present

## 2019-06-12 DIAGNOSIS — Z48815 Encounter for surgical aftercare following surgery on the digestive system: Secondary | ICD-10-CM | POA: Diagnosis not present

## 2019-06-12 DIAGNOSIS — K5732 Diverticulitis of large intestine without perforation or abscess without bleeding: Secondary | ICD-10-CM | POA: Diagnosis not present

## 2019-06-20 DIAGNOSIS — K5732 Diverticulitis of large intestine without perforation or abscess without bleeding: Secondary | ICD-10-CM | POA: Diagnosis not present

## 2019-06-20 DIAGNOSIS — R634 Abnormal weight loss: Secondary | ICD-10-CM | POA: Diagnosis not present

## 2019-06-20 DIAGNOSIS — J449 Chronic obstructive pulmonary disease, unspecified: Secondary | ICD-10-CM | POA: Diagnosis not present

## 2019-06-20 DIAGNOSIS — E43 Unspecified severe protein-calorie malnutrition: Secondary | ICD-10-CM | POA: Diagnosis not present

## 2019-06-20 DIAGNOSIS — Z48815 Encounter for surgical aftercare following surgery on the digestive system: Secondary | ICD-10-CM | POA: Diagnosis not present

## 2019-06-20 DIAGNOSIS — Z433 Encounter for attention to colostomy: Secondary | ICD-10-CM | POA: Diagnosis not present

## 2019-06-23 ENCOUNTER — Ambulatory Visit (INDEPENDENT_AMBULATORY_CARE_PROVIDER_SITE_OTHER): Payer: Medicare Other | Admitting: Internal Medicine

## 2019-06-23 DIAGNOSIS — R066 Hiccough: Secondary | ICD-10-CM | POA: Diagnosis not present

## 2019-06-23 DIAGNOSIS — R110 Nausea: Secondary | ICD-10-CM | POA: Diagnosis not present

## 2019-06-23 MED ORDER — FAMOTIDINE 20 MG PO TABS: 20.0000 mg | ORAL_TABLET | Freq: Two times a day (BID) | ORAL | 2 refills | Status: DC

## 2019-06-23 MED ORDER — CHLORPROMAZINE HCL 10 MG PO TABS: 10.0000 mg | ORAL_TABLET | Freq: Three times a day (TID) | ORAL | 0 refills | Status: DC | PRN

## 2019-06-23 MED ORDER — ONDANSETRON HCL 4 MG PO TABS: 4.0000 mg | ORAL_TABLET | Freq: Three times a day (TID) | ORAL | 1 refills | Status: DC | PRN

## 2019-06-23 NOTE — Patient Instructions (Signed)
Please take all new medication as prescribed - the nausea medication, pepcid for acid, and thorazine as needed for hiccups  Please continue all other medications as before, and refills have been done if requested.  Please have the pharmacy call with any other refills you may need.  Please continue your efforts at being more active, low cholesterol diet, and weight control  Please keep your appointments with your specialists as you may have planned

## 2019-06-23 NOTE — Progress Notes (Signed)
Patient ID: Theresa Arnold, female   DOB: 11-Sep-1931, 86 y.o.   MRN: [id number]  Phone Visit:  Cumulative time during 7-day interval 13 min, there was not an associated office visit for this concern within a 7 day period.  Verbal consent for services obtained from patient prior to services given.  Names of all persons present for services: Cathlean Cower, MD, patient  Chief complaint: hiccups and nausea  History, background, results pertinent:  Here with c/o persistent 3 days hiccup and interrmittent mild nausea but Denies worsening reflux, abd pain, dysphagia, n/v, bowel change or blood.   Pt denies fever, wt loss, night sweats, loss of appetite, or other constitutional symptoms  Seemed to start after soup eating out at [employer] 3 days ago.  Pt denies chest pain, increased sob or doe, wheezing, orthopnea, PND, increased LE swelling, palpitations, dizziness or syncope.   Pt denies polydipsia, polyuria Past Medical History:  Diagnosis Date  . CERVICAL RADICULOPATHY, LEFT 09/03/2010  . Cervical spondylosis 11/10/2011  . COPD (chronic obstructive pulmonary disease) ([place]) 11/10/2011  . HYPERLIPIDEMIA 09/03/2010   No results found for this or any previous visit (from the past 48 hour(s)).  Lab Results  Component Value Date   WBC 10.4 03/28/2019   HGB 8.9 (L) 03/28/2019   HCT 27.9 (L) 03/28/2019   PLT 198 03/28/2019   GLUCOSE 98 04/01/2019   CHOL 209 (H) 11/24/2017   TRIG 65 03/27/2019   HDL 76.30 11/24/2017   LDLDIRECT 142.7 11/10/2011   LDLCALC 118 (H) 11/24/2017   ALT 48 (H) 03/31/2019   AST 65 (H) 03/31/2019   NA 140 04/01/2019   K 3.8 04/01/2019   CL 108 04/01/2019   CREATININE 0.41 (L) 04/01/2019   BUN 20 04/01/2019   CO2 25 04/01/2019   TSH 0.75 11/24/2017   INR 0.97 04/18/2018   A/P/next steps:   1)  Hiccups and nausea; etiology unclear but overall seems benign, for symptomatic meds including thorazine and zofran prn,  to f/u any worsening symptoms or concerns  Cathlean Cower MD

## 2019-06-25 ENCOUNTER — Encounter: Payer: Self-pay | Admitting: Internal Medicine

## 2019-06-25 NOTE — Assessment & Plan Note (Signed)
See notes

## 2019-07-24 ENCOUNTER — Telehealth: Payer: Self-pay | Admitting: Internal Medicine

## 2019-07-24 NOTE — Telephone Encounter (Signed)
Scheduler contacted patient to provided [place] testing phone number. Patient is already scheduled for vaccine on 08/01/19. Patient wants to know does Dr Jenny Reichmann recommend her getting the vaccination. Please advise   Copied from [place] [id number]. Topic: General - Inquiry >> Jul 24, 2019 12:39 PM Percell Belt A wrote: Reason for CRM: pt called in and stated she called to make an appt to get the Covid vaccine , she would like to know if she needs to get it.  She is currently wearing a colostomy bag.  Please advise  Best number [phone]

## 2019-07-24 NOTE — Telephone Encounter (Signed)
Yes, all persons are recommended to have the vaccine unless you have had severe allergy reactions in the past such as anaphylaxis

## 2019-07-25 NOTE — Telephone Encounter (Signed)
Notified pt w/MD response.../lmb 

## 2019-08-16 ENCOUNTER — Ambulatory Visit (HOSPITAL_COMMUNITY)
Admission: EM | Admit: 2019-08-16 | Discharge: 2019-08-16 | Disposition: A | Discharge status: Home / Self Care | Payer: Medicare Other | Attending: Family Medicine | Admitting: Family Medicine

## 2019-08-16 ENCOUNTER — Telehealth (HOSPITAL_COMMUNITY): Payer: Self-pay | Admitting: Urgent Care

## 2019-08-16 ENCOUNTER — Other Ambulatory Visit: Payer: Self-pay

## 2019-08-16 ENCOUNTER — Encounter (HOSPITAL_COMMUNITY): Payer: Self-pay | Admitting: Emergency Medicine

## 2019-08-16 DIAGNOSIS — E785 Hyperlipidemia, unspecified: Secondary | ICD-10-CM | POA: Insufficient documentation

## 2019-08-16 DIAGNOSIS — R519 Headache, unspecified: Secondary | ICD-10-CM | POA: Insufficient documentation

## 2019-08-16 DIAGNOSIS — R066 Hiccough: Secondary | ICD-10-CM | POA: Diagnosis not present

## 2019-08-16 DIAGNOSIS — B370 Candidal stomatitis: Secondary | ICD-10-CM | POA: Insufficient documentation

## 2019-08-16 DIAGNOSIS — R112 Nausea with vomiting, unspecified: Secondary | ICD-10-CM | POA: Insufficient documentation

## 2019-08-16 DIAGNOSIS — Z87891 Personal history of nicotine dependence: Secondary | ICD-10-CM | POA: Insufficient documentation

## 2019-08-16 DIAGNOSIS — R509 Fever, unspecified: Secondary | ICD-10-CM | POA: Diagnosis not present

## 2019-08-16 DIAGNOSIS — J449 Chronic obstructive pulmonary disease, unspecified: Secondary | ICD-10-CM | POA: Diagnosis not present

## 2019-08-16 DIAGNOSIS — Z20822 Contact with and (suspected) exposure to covid-19: Secondary | ICD-10-CM | POA: Insufficient documentation

## 2019-08-16 MED ORDER — ONDANSETRON 8 MG PO TBDP: 8.0000 mg | ORAL_TABLET | Freq: Three times a day (TID) | ORAL | 0 refills | Status: DC | PRN

## 2019-08-16 MED ORDER — ONDANSETRON 4 MG PO TBDP
8.0000 mg | ORAL_TABLET | Freq: Once | ORAL | Status: AC
  Administered 2019-08-16: 8 mg via ORAL

## 2019-08-16 MED ORDER — ACETAMINOPHEN 160 MG/5ML PO SUSP: 480.0000 mg | Freq: Four times a day (QID) | ORAL | 0 refills | Status: DC | PRN

## 2019-08-16 MED ORDER — ONDANSETRON 4 MG PO TBDP
ORAL_TABLET | ORAL | Status: AC
  Filled 2019-08-16: qty 1

## 2019-08-16 MED ORDER — CLOTRIMAZOLE 10 MG MT TROC: 10.0000 mg | Freq: Every day | OROMUCOSAL | 0 refills | Status: AC

## 2019-08-16 NOTE — Discharge Instructions (Addendum)
You may take 500mg -650mg  Tylenol every 6 hours for fevers, aches, joint pain and inflammation.  Make sure you push fluids drinking mostly water but mix it with Gatorade.  Try to eat light meals including soups, broths and soft foods, fruits.  You may use Zofran for your nausea and vomiting once every 8 hours.

## 2019-08-16 NOTE — ED Provider Notes (Signed)
[facility]   MRN: [id number] DOB: 1931-11-22  Subjective:   Theresa Arnold is a 86 y.o. female presenting for 3 day hx of acute onset fever with nausea and vomiting. Feels like she continues to have to spit up. Has had an intermittent headache.  Patient also has very bothersome taste in her mouth, plaque on her tongue that she noticed this morning while brushing her teeth.  Denies confusion, weakness on one side of the body, sore throat, cough, chest pain, shortness of breath.  Denies any known COVID-19 contacts.  She does have a visit scheduled for Friday with her gastroenterologist to review her colostomy.  She is not taking any chronic medications.  No Known Allergies  Past Medical History:  Diagnosis Date  . CERVICAL RADICULOPATHY, LEFT 09/03/2010  . Cervical spondylosis 11/10/2011  . COPD (chronic obstructive pulmonary disease) ([facility]) 11/10/2011  . HYPERLIPIDEMIA 09/03/2010     Past Surgical History:  Procedure Laterality Date  . COLONOSCOPY    . LAPAROSCOPIC PARTIAL COLECTOMY N/A 03/22/2019   Procedure: LAPAROSCOPY, SPLENIC FLEXURE MOBILIZATION WITH OPEN HARTMANN PROCEDURE;  Surgeon: Luretha Murphy, MD;  Location: [facility];  Service: General;  Laterality: N/A;    Family History  Problem Relation Age of Onset  . Colon cancer Sister   . Breast cancer Sister   . Heart disease Father   . Hip fracture Mother   . Hypertension Other     Social History   Tobacco Use  . Smoking status: Former Smoker    Packs/day: 1.00    Years: 30.00    Pack years: 30.00    Types: Cigarettes    Quit date: 07/14/2011    Years since quitting: 8.0  . Smokeless tobacco: Never Used  . Tobacco comment: QUIT 2013   Substance Use Topics  . Alcohol use: Yes    Comment: wine occasionally  . Drug use: No    ROS   Objective:   Vitals: BP (!) 92/58 (BP Location: Right Arm) Comment (BP Location): small cuff  Pulse 92   Temp 98.5 F (36.9 C) (Oral)   Resp 18   SpO2 99%   BP was  151/74, left arm in seated position on recheck by PA-Tosha Belgarde.   Wt Readings from Last 3 Encounters:  06/05/19 108 lb (49 kg)  04/01/19 105 lb 6.1 oz (47.8 kg)  03/11/19 102 lb (46.3 kg)   Temp Readings from Last 3 Encounters:  08/16/19 98.5 F (36.9 C) (Oral)  06/05/19 98.1 F (36.7 C) (Oral)  04/02/19 98.1 F (36.7 C) (Oral)   BP Readings from Last 3 Encounters:  08/16/19 (!) 92/58  06/05/19 124/74  04/02/19 119/65   Pulse Readings from Last 3 Encounters:  08/16/19 92  06/05/19 68  04/02/19 67    Physical Exam Constitutional:      General: She is not in acute distress.    Appearance: Normal appearance. She is well-developed. She is not ill-appearing, toxic-appearing or diaphoretic.  HENT:     Head: Normocephalic and atraumatic.     Nose: Nose normal.     Mouth/Throat:     Mouth: Mucous membranes are moist.     Comments: Diffuse white plaque overlying tongue.  Extends to a lesser extent to uvula and pharynx. Eyes:     General: No scleral icterus.    Extraocular Movements: Extraocular movements intact.     Pupils: Pupils are equal, round, and reactive to light.  Cardiovascular:     Rate and Rhythm: Normal rate and  regular rhythm.     Pulses: Normal pulses.     Heart sounds: Normal heart sounds. No murmur. No friction rub. No gallop.   Pulmonary:     Effort: Pulmonary effort is normal. No respiratory distress.     Breath sounds: Normal breath sounds. No stridor. No wheezing, rhonchi or rales.  Abdominal:     General: Bowel sounds are normal. There is no distension.     Palpations: Abdomen is soft. There is no mass.     Tenderness: There is no abdominal tenderness. There is no right CVA tenderness, left CVA tenderness, guarding or rebound.  Skin:    General: Skin is warm and dry.     Findings: No rash.  Neurological:     General: No focal deficit present.     Mental Status: She is alert and oriented to person, place, and time.  Psychiatric:        Mood and Affect:  Mood normal.        Behavior: Behavior normal.        Thought Content: Thought content normal.        Judgment: Judgment normal.      Assessment and Plan :   1. Oral thrush   2. Nausea and vomiting, intractability of vomiting not specified, unspecified vomiting type   3. Hiccups   4. Generalized headache     Patient given p.o. Zofran in clinic.  Suspect that she has oral thrush and will cover for this with clotrimazole troche. Also discussed that this may be the primary source of her n/v which in turn is causing her headache due to developing dehydration. Low suspicion for COVID 19 but will check for this. Maintain appt with general surgeon. Counseled patient on potential for adverse effects with medications prescribed/recommended today, ER and return-to-clinic precautions discussed, patient verbalized understanding.    Wallis Bamberg, [place] 08/16/19 1432

## 2019-08-16 NOTE — ED Triage Notes (Addendum)
Onset since Sunday, 08/13/2019, of fever and nausea and vomiting.  Patient is complaining of hiccups and white coating on tongue  Patient has had a colostomy before and intestinal surgery.  patient is to see surgeon on Friday, 08/18/2019  Patient has had covid vaccine #1, kpatient goes for covid vaccine #2 next week

## 2019-08-16 NOTE — Telephone Encounter (Signed)
Advised patient that APAP is not covered otc. Recommended she pick this up otc. However, received a call from pharmacy stating she is requesting a script.

## 2019-08-18 ENCOUNTER — Ambulatory Visit: Payer: Self-pay | Admitting: Surgery

## 2019-08-18 DIAGNOSIS — Z933 Colostomy status: Secondary | ICD-10-CM | POA: Diagnosis not present

## 2019-08-18 LAB — NOVEL CORONAVIRUS, NAA (HOSP ORDER, SEND-OUT TO REF LAB; TAT 18-24 HRS): SARS-CoV-2, NAA: NOT DETECTED

## 2019-08-29 NOTE — Patient Instructions (Addendum)
DUE TO COVID-19 ONLY ONE VISITOR IS ALLOWED TO COME WITH YOU AND STAY IN THE WAITING ROOM ONLY DURING PRE OP AND PROCEDURE DAY OF SURGERY. THE 1 VISITOR MAY VISIT WITH YOU AFTER SURGERY IN YOUR PRIVATE ROOM DURING VISITING HOURS ONLY!  YOU NEED TO HAVE A COVID 19 TEST ON_Saturday  02/20/2021______ @____11 :30 am___, THIS TEST MUST BE DONE BEFORE SURGERY, COME  [address] [place] , [id number].  ([place]) ONCE YOUR COVID TEST IS COMPLETED, PLEASE BEGIN THE QUARANTINE INSTRUCTIONS AS OUTLINED IN YOUR HANDOUT.                NASHARA PIERSOL     Your procedure is scheduled on: Monday 09/04/2019   Report to [facility] Main  Entrance    Report to Short Stay at  [place]  AM     Call this number if you have problems the morning of surgery [phone]                           Follow Bowel prep instructions from Dr. Hassell Done on Sunday 09/03/2019 and drink clear liquids all day.    DRINK 2 PRESURGERY ENSURE DRINKS THE NIGHT BEFORE SURGERY AT 1000 PM AND 1 PRESURGERY DRINK THE DAY OF THE PROCEDURE 3 HOURS PRIOR TO SCHEDULED SURGERY.    NOTHING BY MOUTH EXCEPT CLEAR LIQUIDS UNTIL THREE HOURS PRIOR TO SCHEDULED SURGERY.    PLEASE FINISH PRESURGERY ENSURE DRINK PER SURGEON ORDER 3 HOURS PRIOR TO SCHEDULED SURGERY TIME WHICH NEEDS TO BE COMPLETED AT  0430 am    CLEAR LIQUID DIET   Foods Allowed                                                                     Foods Excluded  Coffee and tea, regular and decaf                             liquids that you cannot  Plain Jell-O any favor except red or purple                                           see through such as: Fruit ices (not with fruit pulp)                                     milk, soups, orange juice  Iced Popsicles                                    All solid food Carbonated beverages, regular and diet                                    Cranberry, grape and apple juices Sports drinks like  Gatorade Lightly seasoned clear broth or consume(fat free) Sugar, honey syrup  Sample  Menu Breakfast                                Lunch                                     Supper Cranberry juice                    Beef broth                            Chicken broth Jell-O                                     Grape juice                           Apple juice Coffee or tea                        Jell-O                                      Popsicle                                                Coffee or tea                        Coffee or tea  _____________________________________________________________________      BRUSH YOUR TEETH MORNING OF SURGERY AND RINSE YOUR MOUTH OUT, NO CHEWING GUM CANDY OR MINTS.     Take these medicines the morning of surgery with A SIP OF WATER: none                                 You may not have any metal on your body including hair pins and              piercings  Do not wear jewelry, make-up, lotions, powders or perfumes, deodorant             Do not wear nail polish on your fingernails.  Do not shave  48 hours prior to surgery.              Do not bring valuables to the hospital. [place].  Contacts, dentures or bridgework may not be worn into surgery.  Leave suitcase in the car. After surgery it may be brought to your room.                  Please read over the following fact sheets you were given: _____________________________________________________________________             [facility] - Preparing for Surgery Before surgery, you can play an important role.  Because skin is not sterile, your  skin needs to be as free of germs as possible.  You can reduce the number of germs on your skin by washing with CHG (chlorahexidine gluconate) soap before surgery.  CHG is an antiseptic cleaner which kills germs and bonds with the skin to continue killing germs even after washing. Please DO NOT  use if you have an allergy to CHG or antibacterial soaps.  If your skin becomes reddened/irritated stop using the CHG and inform your nurse when you arrive at Short Stay. Do not shave (including legs and underarms) for at least 48 hours prior to the first CHG shower.  You may shave your face/neck. Please follow these instructions carefully:  1.  Shower with CHG Soap the night before surgery and the  morning of Surgery.  2.  If you choose to wash your hair, wash your hair first as usual with your  normal  shampoo.  3.  After you shampoo, rinse your hair and body thoroughly to remove the  shampoo.                           4.  Use CHG as you would any other liquid soap.  You can apply chg directly  to the skin and wash                       Gently with a scrungie or clean washcloth.  5.  Apply the CHG Soap to your body ONLY FROM THE NECK DOWN.   Do not use on face/ open                           Wound or open sores. Avoid contact with eyes, ears mouth and genitals (private parts).                       Wash face,  Genitals (private parts) with your normal soap.             6.  Wash thoroughly, paying special attention to the area where your surgery  will be performed.  7.  Thoroughly rinse your body with warm water from the neck down.  8.  DO NOT shower/wash with your normal soap after using and rinsing off  the CHG Soap.                9.  Pat yourself dry with a clean towel.            10.  Wear clean pajamas.            11.  Place clean sheets on your bed the night of your first shower and do not  sleep with pets. Day of Surgery : Do not apply any lotions/deodorants the morning of surgery.  Please wear clean clothes to the hospital/surgery center.  FAILURE TO FOLLOW THESE INSTRUCTIONS MAY RESULT IN THE CANCELLATION OF YOUR SURGERY PATIENT SIGNATURE_________________________________  NURSE  SIGNATURE__________________________________  ________________________________________________________________________   Adam Phenix  An incentive spirometer is a tool that can help keep your lungs clear and active. This tool measures how well you are filling your lungs with each breath. Taking long deep breaths may help reverse or decrease the chance of developing breathing (pulmonary) problems (especially infection) following:  A long period of time when you are unable to move or be active. BEFORE THE PROCEDURE   If the spirometer includes an indicator  to show your best effort, your nurse or respiratory therapist will set it to a desired goal.  If possible, sit up straight or lean slightly forward. Try not to slouch.  Hold the incentive spirometer in an upright position. INSTRUCTIONS FOR USE  1. Sit on the edge of your bed if possible, or sit up as far as you can in bed or on a chair. 2. Hold the incentive spirometer in an upright position. 3. Breathe out normally. 4. Place the mouthpiece in your mouth and seal your lips tightly around it. 5. Breathe in slowly and as deeply as possible, raising the piston or the ball toward the top of the column. 6. Hold your breath for 3-5 seconds or for as long as possible. Allow the piston or ball to fall to the bottom of the column. 7. Remove the mouthpiece from your mouth and breathe out normally. 8. Rest for a few seconds and repeat Steps 1 through 7 at least 10 times every 1-2 hours when you are awake. Take your time and take a few normal breaths between deep breaths. 9. The spirometer may include an indicator to show your best effort. Use the indicator as a goal to work toward during each repetition. 10. After each set of 10 deep breaths, practice coughing to be sure your lungs are clear. If you have an incision (the cut made at the time of surgery), support your incision when coughing by placing a pillow or rolled up towels firmly  against it. Once you are able to get out of bed, walk around indoors and cough well. You may stop using the incentive spirometer when instructed by your caregiver.  RISKS AND COMPLICATIONS  Take your time so you do not get dizzy or light-headed.  If you are in pain, you may need to take or ask for pain medication before doing incentive spirometry. It is harder to take a deep breath if you are having pain. AFTER USE  Rest and breathe slowly and easily.  It can be helpful to keep track of a log of your progress. Your caregiver can provide you with a simple table to help with this. If you are using the spirometer at home, follow these instructions: [place] IF:   You are having difficultly using the spirometer.  You have trouble using the spirometer as often as instructed.  Your pain medication is not giving enough relief while using the spirometer.  You develop fever of 100.5 F (38.1 C) or higher. SEEK IMMEDIATE MEDICAL CARE IF:   You cough up bloody sputum that had not been present before.  You develop fever of 102 F (38.9 C) or greater.  You develop worsening pain at or near the incision site. MAKE SURE YOU:   Understand these instructions.  Will watch your condition.  Will get help right away if you are not doing well or get worse. Document Released: 11/09/2006 Document Revised: 09/21/2011 Document Reviewed: 01/10/2007 [facility] Patient Information 2014 [place], [place].   ________________________________________________________________________

## 2019-08-30 ENCOUNTER — Other Ambulatory Visit: Payer: Self-pay

## 2019-08-30 ENCOUNTER — Encounter (HOSPITAL_COMMUNITY)
Admission: RE | Admit: 2019-08-30 | Discharge: 2019-08-30 | Disposition: A | Discharge status: Home / Self Care | Payer: Medicare Other | Source: Ambulatory Visit | Attending: Surgery | Admitting: Surgery

## 2019-08-30 ENCOUNTER — Encounter (HOSPITAL_COMMUNITY): Payer: Self-pay

## 2019-08-30 DIAGNOSIS — Z01818 Encounter for other preprocedural examination: Secondary | ICD-10-CM | POA: Diagnosis not present

## 2019-08-30 HISTORY — DX: Diverticulitis of intestine, part unspecified, without perforation or abscess without bleeding: K57.92

## 2019-08-30 NOTE — Progress Notes (Signed)
PCP - Dr. Cathlean Cower  LOV-06/05/2019 epic Cardiologist - n/a  Chest x-ray - n/a EKG - 03/18/2019  epic Stress Test - n/a ECHO - 11/19/2011  epic Cardiac Cath - n/a  Sleep Study - n/a CPAP - n/a  Fasting Blood Sugar -n/a  Checks Blood Sugar ___0__ times a day  Blood Thinner Instructions:n/a Aspirin Instructions:n/a Last Dose:n/a  Anesthesia review:    Patient has a history of diverticulitis, COPD, and PVD.  Patient denies shortness of breath, fever, cough and chest pain at PAT appointment   Patient verbalized understanding of instructions that were given to them at the PAT appointment. Patient was also instructed that they will need to review over the PAT instructions again at home before surgery.

## 2019-08-31 ENCOUNTER — Inpatient Hospital Stay (HOSPITAL_COMMUNITY): Admission: RE | Admit: 2019-08-31 | Payer: Medicare Other | Source: Ambulatory Visit

## 2019-08-31 ENCOUNTER — Other Ambulatory Visit (HOSPITAL_COMMUNITY): Payer: Medicare Other

## 2019-09-01 ENCOUNTER — Telehealth: Payer: Self-pay

## 2019-09-01 ENCOUNTER — Other Ambulatory Visit: Payer: Self-pay

## 2019-09-01 ENCOUNTER — Encounter (HOSPITAL_COMMUNITY)
Admission: RE | Admit: 2019-09-01 | Discharge: 2019-09-01 | Disposition: A | Discharge status: Home / Self Care | Payer: Medicare Other | Source: Ambulatory Visit | Attending: Surgery | Admitting: Surgery

## 2019-09-01 LAB — CBC
HCT: 36.6 % (ref 36.0–46.0)
Hemoglobin: 11.4 g/dL — ABNORMAL LOW (ref 12.0–15.0)
MCH: 29.2 pg (ref 26.0–34.0)
MCHC: 31.1 g/dL (ref 30.0–36.0)
MCV: 93.8 fL (ref 80.0–100.0)
Platelets: 207 10*3/uL (ref 150–400)
RBC: 3.9 MIL/uL (ref 3.87–5.11)
RDW: 15.7 % — ABNORMAL HIGH (ref 11.5–15.5)
WBC: 6.1 10*3/uL (ref 4.0–10.5)
nRBC: 0 % (ref 0.0–0.2)

## 2019-09-01 LAB — BASIC METABOLIC PANEL
Anion gap: 9 (ref 5–15)
BUN: 14 mg/dL (ref 8–23)
CO2: 25 mmol/L (ref 22–32)
Calcium: 9.4 mg/dL (ref 8.9–10.3)
Chloride: 106 mmol/L (ref 98–111)
Creatinine, Ser: 0.56 mg/dL (ref 0.44–1.00)
GFR calc Af Amer: >60 mL/min (ref >60)
GFR calc non Af Amer: >60 mL/min (ref >60)
Glucose, Bld: 91 mg/dL (ref 70–99)
Potassium: 4 mmol/L (ref 3.5–5.1)
Sodium: 140 mmol/L (ref 135–145)

## 2019-09-01 LAB — HEMOGLOBIN A1C
Hgb A1c MFr Bld: 5.8 % — ABNORMAL HIGH (ref 4.8–5.6)
Mean Plasma Glucose: 119.76 mg/dL

## 2019-09-01 NOTE — Telephone Encounter (Signed)
New message   [facility] home [facility] calling    Checking on fax 08/30/2019 .

## 2019-09-02 ENCOUNTER — Other Ambulatory Visit (HOSPITAL_COMMUNITY)
Admission: RE | Admit: 2019-09-02 | Discharge: 2019-09-02 | Disposition: A | Discharge status: Home / Self Care | Payer: Medicare Other | Source: Ambulatory Visit | Attending: Surgery | Admitting: Surgery

## 2019-09-02 DIAGNOSIS — Z01812 Encounter for preprocedural laboratory examination: Secondary | ICD-10-CM | POA: Insufficient documentation

## 2019-09-02 DIAGNOSIS — Z20822 Contact with and (suspected) exposure to covid-19: Secondary | ICD-10-CM | POA: Insufficient documentation

## 2019-09-02 LAB — SARS CORONAVIRUS 2 (TAT 6-24 HRS): SARS Coronavirus 2: NEGATIVE

## 2019-09-02 NOTE — Anesthesia Preprocedure Evaluation (Addendum)
Anesthesia Evaluation  Patient identified by MRN, date of birth, ID band Patient awake    Reviewed: Allergy & Precautions  Airway Mallampati: II  TM Distance: >3 FB     Dental   Pulmonary former smoker,    breath sounds clear to auscultation       Cardiovascular  Rhythm:Regular Rate:Normal  History noted. CG   Neuro/Psych    GI/Hepatic Neg liver ROS, History noted. CG   Endo/Other  negative endocrine ROS  Renal/GU negative Renal ROS     Musculoskeletal  (+) Arthritis ,   Abdominal   Peds  Hematology  (+) anemia ,   Anesthesia Other Findings   Reproductive/Obstetrics                            Anesthesia Physical Anesthesia Plan  ASA: III  Anesthesia Plan: General   Post-op Pain Management:    Induction: Intravenous  PONV Risk Score and Plan: 3 and Ondansetron and Midazolam  Airway Management Planned:   Additional Equipment:   Intra-op Plan:   Post-operative Plan: Possible Post-op intubation/ventilation  Informed Consent: I have reviewed the patients History and Physical, chart, labs and discussed the procedure including the risks, benefits and alternatives for the proposed anesthesia with the patient or authorized representative who has indicated his/her understanding and acceptance.     Dental advisory given  Plan Discussed with: Anesthesiologist and CRNA  Anesthesia Plan Comments:        Anesthesia Quick Evaluation

## 2019-09-03 MED ORDER — BUPIVACAINE LIPOSOME 1.3 % IJ SUSP
20.0000 mL | INTRAMUSCULAR | Status: DC
  Filled 2019-09-03: qty 20

## 2019-09-03 NOTE — H&P (Signed)
Chief Complaint:  Prior Hartmann resection with colostomy  History of Present Illness:  Theresa Arnold is an 86 y.o. female who underwent sigmoid resection in Sept 2020 with Hartmann procedure for perforated diveticulitis that had walled off and failed to get better with antibiotics.  She has returned to the office where she sought to have her ostomy taken down.  She seems to understand the risks.  Past Medical History:  Diagnosis Date  . CERVICAL RADICULOPATHY, LEFT 09/03/2010  . Cervical spondylosis 11/10/2011  . COPD (chronic obstructive pulmonary disease) ([place]) 11/10/2011  . Diverticulitis   . HYPERLIPIDEMIA 09/03/2010    Past Surgical History:  Procedure Laterality Date  . COLONOSCOPY    . EYE SURGERY     bilateral cataracts with lens implants  . LAPAROSCOPIC PARTIAL COLECTOMY N/A 03/22/2019   Procedure: LAPAROSCOPY, SPLENIC FLEXURE MOBILIZATION WITH OPEN HARTMANN PROCEDURE;  Surgeon: [name], MD;  Location: [facility] ORS;  Service: General;  Laterality: N/A;    Current Facility-Administered Medications  Medication Dose Route Frequency Provider Last Rate Last Admin  . [START ON 09/04/2019] bupivacaine liposome (EXPAREL) 1.3 % injection 266 mg  20 mL Infiltration On Call to OR Polly Cobia, [facility]       Current Outpatient Medications  Medication Sig Dispense Refill  . acetaminophen (TYLENOL CHILDRENS) 160 MG/5ML suspension Take 15 mLs (480 mg total) by mouth every 6 (six) hours as needed for fever. (Patient not taking: Reported on 08/25/2019) 500 mL 0  . alum & mag hydroxide-simeth (MAALOX/MYLANTA) 200-200-20 MG/5ML suspension Take 30 mLs by mouth every 6 (six) hours as needed for indigestion or heartburn (or bloating). (Patient not taking: Reported on 08/25/2019) 355 mL 0  . chlorproMAZINE (THORAZINE) 10 MG tablet Take 1 tablet (10 mg total) by mouth 3 (three) times daily as needed for hiccoughs. (Patient not taking: Reported on 08/25/2019) 30 tablet 0  . famotidine (PEPCID) 20 MG tablet  Take 1 tablet (20 mg total) by mouth 2 (two) times daily. (Patient not taking: Reported on 08/25/2019) 60 tablet 2  . ipratropium-albuterol (DUONEB) 0.5-2.5 (3) MG/3ML SOLN Take 3 mLs by nebulization every 4 (four) hours as needed. (Patient not taking: Reported on 08/25/2019) 360 mL   . magic mouthwash SOLN Take 15 mLs by mouth 4 (four) times daily as needed for mouth pain (sore throat). (Patient not taking: Reported on 08/25/2019) 100 mL 0  . ondansetron (ZOFRAN) 4 MG tablet Take 1 tablet (4 mg total) by mouth every 8 (eight) hours as needed for nausea or vomiting. (Patient not taking: Reported on 08/25/2019) 30 tablet 1  . ondansetron (ZOFRAN-ODT) 8 MG disintegrating tablet Take 1 tablet (8 mg total) by mouth every 8 (eight) hours as needed for nausea or vomiting. (Patient not taking: Reported on 08/25/2019) 20 tablet 0  . oxyCODONE (ROXICODONE) 5 MG immediate release tablet Take 1 tablet (5 mg total) by mouth every 6 (six) hours as needed for severe pain. (Patient not taking: Reported on 08/25/2019) 60 tablet 0   Patient has no known allergies. Family History  Problem Relation Age of Onset  . Colon cancer Sister   . Breast cancer Sister   . Heart disease Father   . Hip fracture Mother   . Hypertension Other    Social History:   reports that she quit smoking about 8 years ago. Her smoking use included cigarettes. She has a 30.00 pack-year smoking history. She has never used smokeless tobacco. She reports current alcohol use. She reports that she does not  use drugs.   REVIEW OF SYSTEMS : Negative except for see probem list  Physical Exam:   There were no vitals taken for this visit. There is no height or weight on file to calculate BMI.  Gen:  WDWN AAF NAD  Neurological: Alert and oriented to person, place, and time. Motor and sensory function is grossly intact  Head: Normocephalic and atraumatic.  Eyes: Conjunctivae are normal. Pupils are equal, round, and reactive to light. No scleral  icterus.  Neck: Normal range of motion. Neck supple. No tracheal deviation or thyromegaly present.  Cardiovascular:  SR without murmurs or gallops.  No carotid bruits Breast:  Not examined Respiratory: Effort normal.  No respiratory distress. No chest wall tenderness. Breath sounds normal.  No wheezes, rales or rhonchi.  Abdomen:  Ostomy in place GU:  Not examined Musculoskeletal: Normal range of motion. Extremities are nontender. No cyanosis, edema or clubbing noted Lymphadenopathy: No cervical, preauricular, postauricular or axillary adenopathy is present Skin: Skin is warm and dry. No rash noted. No diaphoresis. No erythema. No pallor. Pscyh: Normal mood and affect. Behavior is normal. Judgment and thought content normal.   LABORATORY RESULTS: Results for orders placed or performed during the hospital encounter of 09/02/19 (from the past 48 hour(s))  SARS CORONAVIRUS 2 (TAT 6-24 HRS) Nasopharyngeal Nasopharyngeal Swab     Status: None   Collection Time: 09/02/19  5:31 PM   Specimen: Nasopharyngeal Swab  Result Value Ref Range   SARS Coronavirus 2 NEGATIVE NEGATIVE    Comment: (NOTE) SARS-CoV-2 target nucleic acids are NOT DETECTED. The SARS-CoV-2 RNA is generally detectable in upper and lower respiratory specimens during the acute phase of infection. Negative results do not preclude SARS-CoV-2 infection, do not rule out co-infections with other pathogens, and should not be used as the sole basis for treatment or other patient management decisions. Negative results must be combined with clinical observations, patient history, and epidemiological information. The expected result is Negative. Fact Sheet for Patients: SugarRoll.be Fact Sheet for Healthcare Providers: https://www.woods-mathews.com/ This test is not yet approved or cleared by the [place] FDA and  has been authorized for detection and/or diagnosis of SARS-CoV-2 by FDA under  an Emergency Use Authorization (EUA). This EUA will remain  in effect (meaning this test can be used) for the duration of the COVID-19 declaration under Section 56 4(b)(1) of the Act, 21 U.S.C. section 360bbb-3(b)(1), unless the authorization is terminated or revoked sooner. Performed at [facility], [place] [address], [place], [place] [address]      RADIOLOGY RESULTS: No results found.  Problem List: Patient Active Problem List   Diagnosis Date Noted  . Nausea 06/23/2019  . Abnormal LFTs 06/11/2019  . Acute blood loss anemia 06/11/2019  . Hypoxia 03/24/2019  . Hiccups 03/23/2019  . Colonic partial obstruction from sigmoid stricture 03/18/2019  . Polyp of sigmoid colon 03/18/2019  . Oral thrush 03/18/2019  . Abnormal CT scan, gastrointestinal tract   . Abnormal CT scan, sigmoid colon   . 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 Diverticulitis of sigmoid colon 03/15/2019  . LLQ pain 11/02/2018  . Abdominal pain 09/21/2018  . Venous (peripheral) insufficiency 01/12/2014  . Sprain of ankle, unspecified site 06/28/2013  . Preventative health care 11/10/2011  . Cervical spondylosis 11/10/2011  . OA (osteoarthritis) of knee 11/10/2011  . COPD (chronic obstructive pulmonary disease) ([place]) 11/10/2011  . Dizziness 11/10/2011  . Benign neoplasm of colon 11/24/2010  . Diverticulosis of colon (without mention of hemorrhage) 11/24/2010  .  Encounter for long-term (current) use of other medications 10/07/2010  . HLD (hyperlipidemia) 09/03/2010  . FATIGUE 09/03/2010  . SMOKER 09/10/2009    Assessment & Plan: Functioning ostomy for reversal    Matt B. Hassell Done, MD, [facility] [phone] beeper [phone]  09/03/2019 9:02 PM

## 2019-09-04 ENCOUNTER — Inpatient Hospital Stay (HOSPITAL_COMMUNITY)
Admission: RE | Admit: 2019-09-04 | Discharge: 2019-09-10 | DRG: 346 | Disposition: A | Discharge status: Home / Self Care | Payer: Medicare Other | Attending: Surgery | Admitting: Surgery

## 2019-09-04 ENCOUNTER — Encounter (HOSPITAL_COMMUNITY): Payer: Self-pay | Admitting: Surgery

## 2019-09-04 ENCOUNTER — Inpatient Hospital Stay (HOSPITAL_COMMUNITY): Payer: Medicare Other | Admitting: Certified Registered Nurse Anesthetist

## 2019-09-04 ENCOUNTER — Other Ambulatory Visit: Payer: Self-pay

## 2019-09-04 ENCOUNTER — Encounter (HOSPITAL_COMMUNITY)
Admission: RE | Disposition: A | Discharge status: Home / Self Care | Payer: Self-pay | Source: Ambulatory Visit | Attending: Surgery

## 2019-09-04 DIAGNOSIS — J449 Chronic obstructive pulmonary disease, unspecified: Secondary | ICD-10-CM | POA: Diagnosis present

## 2019-09-04 DIAGNOSIS — Z86010 Personal history of colonic polyps: Secondary | ICD-10-CM | POA: Diagnosis not present

## 2019-09-04 DIAGNOSIS — Z433 Encounter for attention to colostomy: Principal | ICD-10-CM

## 2019-09-04 DIAGNOSIS — Z9889 Other specified postprocedural states: Secondary | ICD-10-CM

## 2019-09-04 DIAGNOSIS — Z79899 Other long term (current) drug therapy: Secondary | ICD-10-CM

## 2019-09-04 DIAGNOSIS — E785 Hyperlipidemia, unspecified: Secondary | ICD-10-CM | POA: Diagnosis not present

## 2019-09-04 DIAGNOSIS — Z20822 Contact with and (suspected) exposure to covid-19: Secondary | ICD-10-CM | POA: Diagnosis present

## 2019-09-04 DIAGNOSIS — Z87891 Personal history of nicotine dependence: Secondary | ICD-10-CM

## 2019-09-04 DIAGNOSIS — K660 Peritoneal adhesions (postprocedural) (postinfection): Secondary | ICD-10-CM | POA: Diagnosis not present

## 2019-09-04 HISTORY — PX: COLOSTOMY REVERSAL: SHX5782

## 2019-09-04 LAB — CBC
HCT: 37.9 % (ref 36.0–46.0)
Hemoglobin: 11.6 g/dL — ABNORMAL LOW (ref 12.0–15.0)
MCH: 29.5 pg (ref 26.0–34.0)
MCHC: 30.6 g/dL (ref 30.0–36.0)
MCV: 96.4 fL (ref 80.0–100.0)
Platelets: 154 10*3/uL (ref 150–400)
RBC: 3.93 MIL/uL (ref 3.87–5.11)
RDW: 15.7 % — ABNORMAL HIGH (ref 11.5–15.5)
WBC: 8.8 10*3/uL (ref 4.0–10.5)
nRBC: 0 % (ref 0.0–0.2)

## 2019-09-04 LAB — CREATININE, SERUM
Creatinine, Ser: 0.66 mg/dL (ref 0.44–1.00)
GFR calc Af Amer: >60 mL/min (ref >60)
GFR calc non Af Amer: >60 mL/min (ref >60)

## 2019-09-04 SURGERY — COLOSTOMY REVERSAL
Anesthesia: General | Site: Abdomen

## 2019-09-04 MED ORDER — ONDANSETRON HCL 4 MG/2ML IJ SOLN
4.0000 mg | Freq: Four times a day (QID) | INTRAMUSCULAR | Status: DC | PRN
  Administered 2019-09-07: 4 mg via INTRAVENOUS
  Filled 2019-09-04: qty 2

## 2019-09-04 MED ORDER — PROPOFOL 10 MG/ML IV BOLUS
INTRAVENOUS | Status: AC
  Filled 2019-09-04: qty 20

## 2019-09-04 MED ORDER — KETAMINE HCL 10 MG/ML IJ SOLN
INTRAMUSCULAR | Status: AC
  Filled 2019-09-04: qty 1

## 2019-09-04 MED ORDER — FENTANYL CITRATE (PF) 100 MCG/2ML IJ SOLN
INTRAMUSCULAR | Status: AC
  Filled 2019-09-04: qty 2

## 2019-09-04 MED ORDER — FENTANYL CITRATE (PF) 100 MCG/2ML IJ SOLN
INTRAMUSCULAR | Status: AC
  Administered 2019-09-04: 50 ug via INTRAVENOUS
  Filled 2019-09-04: qty 4

## 2019-09-04 MED ORDER — METOPROLOL TARTRATE 5 MG/5ML IV SOLN: 5.0000 mg | Freq: Four times a day (QID) | INTRAVENOUS | Status: DC | PRN

## 2019-09-04 MED ORDER — LIDOCAINE 2% (20 MG/ML) 5 ML SYRINGE
INTRAMUSCULAR | Status: AC
  Filled 2019-09-04: qty 5

## 2019-09-04 MED ORDER — ALBUMIN HUMAN 5 % IV SOLN: INTRAVENOUS | Status: DC | PRN

## 2019-09-04 MED ORDER — HYDROCODONE-ACETAMINOPHEN 5-325 MG PO TABS
1.0000 | ORAL_TABLET | ORAL | Status: DC | PRN
  Administered 2019-09-04 – 2019-09-06 (×7): 2 via ORAL
  Administered 2019-09-06 – 2019-09-08 (×3): 1 via ORAL
  Administered 2019-09-08 – 2019-09-10 (×7): 2 via ORAL
  Filled 2019-09-04: qty 1
  Filled 2019-09-04 (×3): qty 2
  Filled 2019-09-04: qty 1
  Filled 2019-09-04 (×9): qty 2
  Filled 2019-09-04: qty 1
  Filled 2019-09-04 (×4): qty 2

## 2019-09-04 MED ORDER — PHENYLEPHRINE 40 MCG/ML (10ML) SYRINGE FOR IV PUSH (FOR BLOOD PRESSURE SUPPORT)
PREFILLED_SYRINGE | INTRAVENOUS | Status: DC | PRN
  Administered 2019-09-04 (×2): 80 ug via INTRAVENOUS

## 2019-09-04 MED ORDER — BUPIVACAINE LIPOSOME 1.3 % IJ SUSP
INTRAMUSCULAR | Status: DC | PRN
  Administered 2019-09-04: 20 mL

## 2019-09-04 MED ORDER — ACETAMINOPHEN 500 MG PO TABS
1000.0000 mg | ORAL_TABLET | ORAL | Status: AC
  Administered 2019-09-04: 1000 mg via ORAL
  Filled 2019-09-04: qty 2

## 2019-09-04 MED ORDER — HYDROMORPHONE HCL 1 MG/ML IJ SOLN
0.2500 mg | INTRAMUSCULAR | Status: DC | PRN
  Administered 2019-09-04 (×2): 0.5 mg via INTRAVENOUS
  Administered 2019-09-04 (×2): 0.25 mg via INTRAVENOUS

## 2019-09-04 MED ORDER — ALVIMOPAN 12 MG PO CAPS
12.0000 mg | ORAL_CAPSULE | Freq: Two times a day (BID) | ORAL | Status: DC
  Administered 2019-09-05 – 2019-09-08 (×7): 12 mg via ORAL
  Filled 2019-09-04 (×7): qty 1

## 2019-09-04 MED ORDER — ONDANSETRON HCL 4 MG/2ML IJ SOLN
INTRAMUSCULAR | Status: DC | PRN
  Administered 2019-09-04: 4 mg via INTRAVENOUS

## 2019-09-04 MED ORDER — SODIUM CHLORIDE 0.9 % IV SOLN
2.0000 g | Freq: Two times a day (BID) | INTRAVENOUS | Status: AC
  Administered 2019-09-04: 2 g via INTRAVENOUS
  Filled 2019-09-04: qty 2

## 2019-09-04 MED ORDER — SODIUM CHLORIDE 0.9 % IV SOLN
2.0000 g | INTRAVENOUS | Status: AC
  Administered 2019-09-04: 08:00:00 2 g via INTRAVENOUS
  Filled 2019-09-04: qty 2

## 2019-09-04 MED ORDER — ONDANSETRON HCL 4 MG PO TABS
4.0000 mg | ORAL_TABLET | Freq: Four times a day (QID) | ORAL | Status: DC | PRN
  Administered 2019-09-09: 4 mg via ORAL
  Filled 2019-09-04: qty 1

## 2019-09-04 MED ORDER — FENTANYL CITRATE (PF) 100 MCG/2ML IJ SOLN: 25.0000 ug | INTRAMUSCULAR | Status: DC | PRN

## 2019-09-04 MED ORDER — HEPARIN SODIUM (PORCINE) 5000 UNIT/ML IJ SOLN
5000.0000 [IU] | Freq: Three times a day (TID) | INTRAMUSCULAR | Status: DC
  Administered 2019-09-04 – 2019-09-10 (×16): 5000 [IU] via SUBCUTANEOUS
  Filled 2019-09-04 (×16): qty 1

## 2019-09-04 MED ORDER — PROPOFOL 10 MG/ML IV BOLUS
INTRAVENOUS | Status: DC | PRN
  Administered 2019-09-04: 130 mg via INTRAVENOUS

## 2019-09-04 MED ORDER — SODIUM CHLORIDE (PF) 0.9 % IJ SOLN
INTRAMUSCULAR | Status: AC
  Filled 2019-09-04: qty 10

## 2019-09-04 MED ORDER — PHENYLEPHRINE 40 MCG/ML (10ML) SYRINGE FOR IV PUSH (FOR BLOOD PRESSURE SUPPORT)
PREFILLED_SYRINGE | INTRAVENOUS | Status: AC
  Filled 2019-09-04: qty 10

## 2019-09-04 MED ORDER — PHENYLEPHRINE HCL-NACL 10-0.9 MG/250ML-% IV SOLN
INTRAVENOUS | Status: DC | PRN
  Administered 2019-09-04: 10 ug/min via INTRAVENOUS

## 2019-09-04 MED ORDER — FENTANYL CITRATE (PF) 100 MCG/2ML IJ SOLN
25.0000 ug | INTRAMUSCULAR | Status: DC | PRN
  Administered 2019-09-04 (×2): 50 ug via INTRAVENOUS

## 2019-09-04 MED ORDER — NEOMYCIN SULFATE 500 MG PO TABS: 1000.0000 mg | ORAL_TABLET | ORAL | Status: DC

## 2019-09-04 MED ORDER — LIDOCAINE HCL 2 % IJ SOLN
INTRAMUSCULAR | Status: AC
  Filled 2019-09-04: qty 20

## 2019-09-04 MED ORDER — ALVIMOPAN 12 MG PO CAPS
12.0000 mg | ORAL_CAPSULE | ORAL | Status: AC
  Administered 2019-09-04: 12 mg via ORAL
  Filled 2019-09-04: qty 1

## 2019-09-04 MED ORDER — MORPHINE SULFATE (PF) 2 MG/ML IV SOLN
1.0000 mg | INTRAVENOUS | Status: DC | PRN
  Administered 2019-09-05 – 2019-09-10 (×7): 1 mg via INTRAVENOUS
  Filled 2019-09-04 (×7): qty 1

## 2019-09-04 MED ORDER — ONDANSETRON HCL 4 MG/2ML IJ SOLN
INTRAMUSCULAR | Status: AC
  Filled 2019-09-04: qty 2

## 2019-09-04 MED ORDER — ALBUMIN HUMAN 5 % IV SOLN
INTRAVENOUS | Status: AC
  Filled 2019-09-04: qty 250

## 2019-09-04 MED ORDER — HEPARIN SODIUM (PORCINE) 5000 UNIT/ML IJ SOLN
5000.0000 [IU] | Freq: Once | INTRAMUSCULAR | Status: AC
  Administered 2019-09-04: 5000 [IU] via SUBCUTANEOUS
  Filled 2019-09-04: qty 1

## 2019-09-04 MED ORDER — FENTANYL CITRATE (PF) 100 MCG/2ML IJ SOLN
INTRAMUSCULAR | Status: DC | PRN
  Administered 2019-09-04: 50 ug via INTRAVENOUS
  Administered 2019-09-04: 25 ug via INTRAVENOUS
  Administered 2019-09-04 (×3): 50 ug via INTRAVENOUS
  Administered 2019-09-04: 25 ug via INTRAVENOUS
  Administered 2019-09-04: 50 ug via INTRAVENOUS

## 2019-09-04 MED ORDER — LIDOCAINE 2% (20 MG/ML) 5 ML SYRINGE
INTRAMUSCULAR | Status: DC | PRN
  Administered 2019-09-04: 1 mg/kg/h via INTRAVENOUS

## 2019-09-04 MED ORDER — CHLORHEXIDINE GLUCONATE CLOTH 2 % EX PADS: 6.0000 | MEDICATED_PAD | Freq: Once | CUTANEOUS | Status: DC

## 2019-09-04 MED ORDER — LACTATED RINGERS IV SOLN: INTRAVENOUS | Status: DC

## 2019-09-04 MED ORDER — KCL IN DEXTROSE-NACL 20-5-0.45 MEQ/L-%-% IV SOLN
INTRAVENOUS | Status: DC
  Filled 2019-09-04 (×4): qty 1000

## 2019-09-04 MED ORDER — KETAMINE HCL 10 MG/ML IJ SOLN
INTRAMUSCULAR | Status: DC | PRN
  Administered 2019-09-04: 25 mg via INTRAVENOUS

## 2019-09-04 MED ORDER — 0.9 % SODIUM CHLORIDE (POUR BTL) OPTIME
TOPICAL | Status: DC | PRN
  Administered 2019-09-04: 4000 mL

## 2019-09-04 MED ORDER — METRONIDAZOLE 500 MG PO TABS: 1000.0000 mg | ORAL_TABLET | ORAL | Status: DC

## 2019-09-04 MED ORDER — ROCURONIUM BROMIDE 10 MG/ML (PF) SYRINGE
PREFILLED_SYRINGE | INTRAVENOUS | Status: AC
  Filled 2019-09-04: qty 10

## 2019-09-04 MED ORDER — SODIUM CHLORIDE (PF) 0.9 % IJ SOLN
INTRAMUSCULAR | Status: DC | PRN
  Administered 2019-09-04: 10 mL

## 2019-09-04 MED ORDER — ROCURONIUM BROMIDE 10 MG/ML (PF) SYRINGE
PREFILLED_SYRINGE | INTRAVENOUS | Status: DC | PRN
  Administered 2019-09-04: 50 mg via INTRAVENOUS
  Administered 2019-09-04: 10 mg via INTRAVENOUS
  Administered 2019-09-04: 5 mg via INTRAVENOUS
  Administered 2019-09-04: 10 mg via INTRAVENOUS

## 2019-09-04 MED ORDER — LIDOCAINE 2% (20 MG/ML) 5 ML SYRINGE
INTRAMUSCULAR | Status: DC | PRN
  Administered 2019-09-04: 60 mg via INTRAVENOUS

## 2019-09-04 MED ORDER — HYDROMORPHONE HCL 1 MG/ML IJ SOLN
INTRAMUSCULAR | Status: AC
  Administered 2019-09-04: 0.25 mg via INTRAVENOUS
  Filled 2019-09-04: qty 1

## 2019-09-04 MED ORDER — IPRATROPIUM-ALBUTEROL 0.5-2.5 (3) MG/3ML IN SOLN: 3.0000 mL | RESPIRATORY_TRACT | Status: DC | PRN

## 2019-09-04 MED ORDER — POLYETHYLENE GLYCOL 3350 17 GM/SCOOP PO POWD: 1.0000 | Freq: Once | ORAL | Status: DC

## 2019-09-04 MED ORDER — PHENYLEPHRINE HCL (PRESSORS) 10 MG/ML IV SOLN
INTRAVENOUS | Status: AC
  Filled 2019-09-04: qty 1

## 2019-09-04 MED ORDER — DEXAMETHASONE SODIUM PHOSPHATE 10 MG/ML IJ SOLN
INTRAMUSCULAR | Status: DC | PRN
  Administered 2019-09-04: 5 mg via INTRAVENOUS

## 2019-09-04 MED ORDER — LACTATED RINGERS IR SOLN
Status: DC | PRN
  Administered 2019-09-04: 1000 mL

## 2019-09-04 MED ORDER — SUGAMMADEX SODIUM 200 MG/2ML IV SOLN
INTRAVENOUS | Status: DC | PRN
  Administered 2019-09-04: 100 mg via INTRAVENOUS

## 2019-09-04 MED ORDER — ENSURE SURGERY PO LIQD
237.0000 mL | Freq: Two times a day (BID) | ORAL | Status: DC
  Administered 2019-09-04 – 2019-09-09 (×2): 237 mL via ORAL
  Filled 2019-09-04 (×11): qty 237

## 2019-09-04 SURGICAL SUPPLY — 56 items
APPLICATOR COTTON TIP 6 STRL (MISCELLANEOUS) ×2 IMPLANT
APPLICATOR COTTON TIP 6IN STRL (MISCELLANEOUS) ×6
BLADE EXTENDED COATED 6.5IN (ELECTRODE) ×3 IMPLANT
CLIP VESOCCLUDE LG 6/CT (CLIP) IMPLANT
COVER SURGICAL LIGHT HANDLE (MISCELLANEOUS) ×3 IMPLANT
COVER WAND RF STERILE (DRAPES) IMPLANT
DERMABOND ADVANCED (GAUZE/BANDAGES/DRESSINGS) ×2
DERMABOND ADVANCED .7 DNX12 (GAUZE/BANDAGES/DRESSINGS) ×1 IMPLANT
DRAPE SHEET LG 3/4 BI-LAMINATE (DRAPES) IMPLANT
DRSG OPSITE POSTOP 4X6 (GAUZE/BANDAGES/DRESSINGS) ×3 IMPLANT
DRSG TEGADERM 2-3/8X2-3/4 SM (GAUZE/BANDAGES/DRESSINGS) ×3 IMPLANT
ELECT REM PT RETURN 15FT ADLT (MISCELLANEOUS) ×3 IMPLANT
GAUZE SPONGE 4X4 12PLY STRL (GAUZE/BANDAGES/DRESSINGS) ×3 IMPLANT
GLOVE BIOGEL M 8.0 STRL (GLOVE) ×3 IMPLANT
GLOVE BIOGEL PI IND STRL 7.0 (GLOVE) ×1 IMPLANT
GLOVE BIOGEL PI INDICATOR 7.0 (GLOVE) ×2
GOWN SPEC L4 XLG W/TWL (GOWN DISPOSABLE) ×3 IMPLANT
GOWN STRL REUS W/TWL LRG LVL3 (GOWN DISPOSABLE) ×3 IMPLANT
GOWN STRL REUS W/TWL XL LVL3 (GOWN DISPOSABLE) ×9 IMPLANT
HANDLE STAPLE EGIA 4 XL (STAPLE) ×3 IMPLANT
HANDLE SUCTION POOLE (INSTRUMENTS) ×1 IMPLANT
KIT BASIN OR (CUSTOM PROCEDURE TRAY) ×3 IMPLANT
KIT TURNOVER KIT A (KITS) ×3 IMPLANT
NS IRRIG 1000ML POUR BTL (IV SOLUTION) ×6 IMPLANT
PACK COLON (CUSTOM PROCEDURE TRAY) ×3 IMPLANT
PENCIL SMOKE EVACUATOR (MISCELLANEOUS) IMPLANT
RELOAD EGIA 60 MED/THCK PURPLE (STAPLE) ×3 IMPLANT
SET IRRIG TUBING LAPAROSCOPIC (IRRIGATION / IRRIGATOR) ×3 IMPLANT
SET TUBE SMOKE EVAC HIGH FLOW (TUBING) ×3 IMPLANT
SHEARS HARMONIC ACE PLUS 36CM (ENDOMECHANICALS) IMPLANT
SLEEVE ADV FIXATION 5X100MM (TROCAR) ×3 IMPLANT
STAPLER VISISTAT 35W (STAPLE) ×3 IMPLANT
SUCTION POOLE HANDLE (INSTRUMENTS) ×3
SUT NOV 1 T60/GS (SUTURE) IMPLANT
SUT NOVA 1 T20/GS 25DT (SUTURE) ×9 IMPLANT
SUT NOVA T20/GS 25 (SUTURE) ×6 IMPLANT
SUT PDS AB 1 CTX 36 (SUTURE) IMPLANT
SUT PDS AB 1 TP1 54 (SUTURE) IMPLANT
SUT PDS AB 4-0 SH 27 (SUTURE) ×6 IMPLANT
SUT PROLENE 2 0 KS (SUTURE) IMPLANT
SUT SILK 2 0 (SUTURE) ×2
SUT SILK 2 0 SH CR/8 (SUTURE) ×3 IMPLANT
SUT SILK 2 0SH CR/8 30 (SUTURE) IMPLANT
SUT SILK 2-0 18XBRD TIE 12 (SUTURE) ×1 IMPLANT
SUT SILK 2-0 30XBRD TIE 12 (SUTURE) IMPLANT
SUT SILK 3 0 (SUTURE) ×2
SUT SILK 3 0 SH CR/8 (SUTURE) ×6 IMPLANT
SUT SILK 3-0 18XBRD TIE 12 (SUTURE) ×1 IMPLANT
SUT VIC AB 2-0 SH 27 (SUTURE) ×2
SUT VIC AB 2-0 SH 27X BRD (SUTURE) ×1 IMPLANT
SYR 10ML ECCENTRIC (SYRINGE) ×3 IMPLANT
TOWEL OR 17X26 10 PK STRL BLUE (TOWEL DISPOSABLE) ×3 IMPLANT
TROCAR ADV FIXATION 5X100MM (TROCAR) ×3 IMPLANT
TROCAR BLADELESS OPT 5 100 (ENDOMECHANICALS) ×3 IMPLANT
WATER STERILE IRR 1000ML POUR (IV SOLUTION) IMPLANT
YANKAUER SUCT BULB TIP NO VENT (SUCTIONS) ×3 IMPLANT

## 2019-09-04 NOTE — Interval H&P Note (Signed)
History and Physical Interval Note:  [id number] AM  Theresa Arnold  has presented today for surgery, with the diagnosis of PRIOR HARTMANN PROCEDURE.  The various methods of treatment have been discussed with the patient and family. After consideration of risks, benefits and other options for treatment, the patient has consented to  Procedure(s): COLOSTOMY REVERSAL (N/A) as a surgical intervention.  The patient's history has been reviewed, patient examined, no change in status, stable for surgery.  I have reviewed the patient's chart and labs.  Questions were answered to the patient's satisfaction.     Pedro Earls

## 2019-09-04 NOTE — Telephone Encounter (Signed)
The information was faxed and confirmed on 08/30/2019

## 2019-09-04 NOTE — Anesthesia Postprocedure Evaluation (Signed)
Anesthesia Post Note  Patient: RAAGA SUDOL  Procedure(s) Performed: PRIMARY COLOCOLOSTOMY REVERSAL AND TAKEDOWN OF HARTMAN (N/A Abdomen)     Patient location during evaluation: PACU Anesthesia Type: General Level of consciousness: awake Pain management: pain level controlled Vital Signs Assessment: post-procedure vital signs reviewed and stable Respiratory status: spontaneous breathing Cardiovascular status: stable Postop Assessment: no apparent nausea or vomiting Anesthetic complications: no    Last Vitals:  Vitals:   09/04/19 1200 09/04/19 1215  BP: 138/69 140/71  Pulse: 65 62  Resp: 16 13  Temp:    SpO2: 100% 100%    Last Pain:  Vitals:   09/04/19 1200  TempSrc:   PainSc: 8                  Dory Verdun

## 2019-09-04 NOTE — Anesthesia Procedure Notes (Signed)
Procedure Name: Intubation Date/Time: 09/04/2019 7:39 AM Performed by: Maxwell Caul, CRNA Pre-anesthesia Checklist: Patient identified, Emergency Drugs available, Suction available and Patient being monitored Patient Re-evaluated:Patient Re-evaluated prior to induction Oxygen Delivery Method: Circle system utilized Preoxygenation: Pre-oxygenation with 100% oxygen Induction Type: IV induction Ventilation: Mask ventilation without difficulty Laryngoscope Size: Mac and 3 Grade View: Grade I Tube type: Oral Tube size: 7.0 mm Number of attempts: 1 Airway Equipment and Method: Stylet Placement Confirmation: ETT inserted through vocal cords under direct vision,  positive ETCO2 and breath sounds checked- equal and bilateral Tube secured with: Tape Dental Injury: Teeth and Oropharynx as per pre-operative assessment

## 2019-09-04 NOTE — Transfer of Care (Signed)
Immediate Anesthesia Transfer of Care Note  Patient: Theresa Arnold  Procedure(s) Performed: PRIMARY COLOCOLOSTOMY REVERSAL AND TAKEDOWN OF HARTMAN (N/A Abdomen)  Patient Location: PACU  Anesthesia Type:General  Level of Consciousness: awake, alert  and oriented  Airway & Oxygen Therapy: Patient Spontanous Breathing and Patient connected to face mask oxygen  Post-op Assessment: Report given to RN and Post -op Vital signs reviewed and stable  Post vital signs: Reviewed and stable  Last Vitals:  Vitals Value Taken Time  BP 165/85 09/04/19 1115  Temp 36.9 C 09/04/19 1115  Pulse 66 09/04/19 1117  Resp 15 09/04/19 1117  SpO2 100 % 09/04/19 1117  Vitals shown include unvalidated device data.  Last Pain:  Vitals:   09/04/19 0551  TempSrc: Oral         Complications: No apparent anesthesia complications

## 2019-09-04 NOTE — Op Note (Signed)
Theresa Arnold  [id number] 04 September 2019    PCP:  Biagio Borg, MD   Surgeon: Kaylyn Lim, MD, FACS  Asst:  Gurney Maxin, MD, FACS  Anes:  general  Preop Dx: Sigmoid colostomy with prior [facility] procedure for diverticulitis Postop Dx: Same, post takedown of colostomy, enterolysis, side to end hand sewn two layer anastomosis [place]) rigid sigmoidoscopy  Procedure: Laparoscopy and enterolysis, open colocolostomy two layer with silk and 4-0 pds; sigmoidoscopy Location Surgery: [facility] 1 Complications: None noted  EBL:   minimal cc  Drains: none  Description of Procedure:  The patient was taken to OR 1 .  After anesthesia was administered and the patient was prepped  with chloroprep and a timeout was performed.  She was in the dorsal lithotomy position and a Foley was inserted.  Access to the abdomen was achieved with 5 mm Optiview through the right upper quadrant.  The ostomy looked good and there were some adhesions of the small bowel down to the left sidewall where the perforated diverticulitis had been.  I had placed two 5 mm trocars and then a third in the lower midline.  When ready to open I used the 5 mm port site in the midline to reopen the lower incision.    The wound protector was used.  Ostomy was taken down and placed in the abdomen.  The rectal stump was identified and freed.  The end of the colon was stapled with a purple 6 cm stapler.  It was brought down to the rectal stump were a [place] type side to end anastomosis was created in two layer with 3-0 silk and 4-0 pds.  Rigid sigmoidoscopy was done and no bubbles were seen and the colon above the anastomosis was insufflated.  Followed the colon protocol.  Abdomen irrigated.    Ostomy site injected with 10 cc Exparel.  The midline was closed with 2-0 vicryl in the lower part and then with interrupted #1 Novafil.  20 cc of Exparel in the midline.  The wounds were closed with staples.  The ostomy was packed with  iodophor and closed with staples around it.    The patient tolerated the procedure well and was taken to the PACU in stable condition.     Matt B. Hassell Done, [place], [facility], [place]

## 2019-09-05 LAB — BASIC METABOLIC PANEL
Anion gap: 5 (ref 5–15)
BUN: 6 mg/dL — ABNORMAL LOW (ref 8–23)
CO2: 25 mmol/L (ref 22–32)
Calcium: 8.3 mg/dL — ABNORMAL LOW (ref 8.9–10.3)
Chloride: 108 mmol/L (ref 98–111)
Creatinine, Ser: 0.58 mg/dL (ref 0.44–1.00)
GFR calc Af Amer: >60 mL/min (ref >60)
GFR calc non Af Amer: >60 mL/min (ref >60)
Glucose, Bld: 113 mg/dL — ABNORMAL HIGH (ref 70–99)
Potassium: 4.4 mmol/L (ref 3.5–5.1)
Sodium: 138 mmol/L (ref 135–145)

## 2019-09-05 LAB — CBC
HCT: 32.6 % — ABNORMAL LOW (ref 36.0–46.0)
Hemoglobin: 10.1 g/dL — ABNORMAL LOW (ref 12.0–15.0)
MCH: 29.8 pg (ref 26.0–34.0)
MCHC: 31 g/dL (ref 30.0–36.0)
MCV: 96.2 fL (ref 80.0–100.0)
Platelets: 142 10*3/uL — ABNORMAL LOW (ref 150–400)
RBC: 3.39 MIL/uL — ABNORMAL LOW (ref 3.87–5.11)
RDW: 15.9 % — ABNORMAL HIGH (ref 11.5–15.5)
WBC: 8.4 10*3/uL (ref 4.0–10.5)
nRBC: 0 % (ref 0.0–0.2)

## 2019-09-05 MED ORDER — CHLORHEXIDINE GLUCONATE CLOTH 2 % EX PADS
6.0000 | MEDICATED_PAD | Freq: Every day | CUTANEOUS | Status: DC
  Administered 2019-09-05 – 2019-09-10 (×5): 6 via TOPICAL

## 2019-09-05 NOTE — Progress Notes (Signed)
    Instructor, Vicenta Dunning, MSN, RN cosigning/attesting to learner's report as follows:  Patient has had difficulty with pain today. This morning her pain was 10/10; throughout the day patient's pain has been managed with hydrocodone and morphine. Medication brought patient's pain down to a 5. Patient stated ambulating is uncomfortable but not as painful as standing up, sitting down, or rolling to one side. Patient stated pain is 10/10 when she has to move. She was given an abdominal binder this morning; patient verbalized this helped with her pain. Patient's appetite is diminished. She has eaten 1/2 cup of jello and had ginger ale to drink. Will continue to encourage fluids, nutrition, and ambulating.

## 2019-09-05 NOTE — Progress Notes (Signed)
Patient ID: Theresa Arnold, female   DOB: [birth year], 86 y.o.   MRN: [id number] [facility] Progress Note:   1 Day Post-Op  Subjective: Mental status is clear;  Had pain this morning with falling back in her bed Objective: Vital signs in last 24 hours: Temp:  [98 F (36.7 C)-100 F (37.8 C)] 100 F (37.8 C) (02/23 0900) Pulse Rate:  [62-70] 65 (02/23 0900) Resp:  [13-18] 18 (02/23 0900) BP: (99-147)/(47-67) 101/51 (02/23 0900) SpO2:  [96 %-100 %] 98 % (02/23 0900)  Intake/Output from previous day: 02/22 0701 - 02/23 0700 In: 5041.3 [P.O.:360; I.V.:4331.3; IV Piggyback:350] Out: 1300 [Urine:1250; Blood:50] Intake/Output this shift: Total I/O In: 454.3 [P.O.:120; I.V.:334.3] Out: 500 [Urine:500]  Physical Exam: Work of breathing is not labored.  Having some abdominal pain   Lab Results:  Results for orders placed or performed during the hospital encounter of 09/04/19 (from the past 48 hour(s))  CBC     Status: Abnormal   Collection Time: 09/04/19  2:27 PM  Result Value Ref Range   WBC 8.8 4.0 - 10.5 K/uL   RBC 3.93 3.87 - 5.11 MIL/uL   Hemoglobin 11.6 (L) 12.0 - 15.0 g/dL   HCT 37.9 36.0 - 46.0 %   MCV 96.4 80.0 - 100.0 fL   MCH 29.5 26.0 - 34.0 pg   MCHC 30.6 30.0 - 36.0 g/dL   RDW 15.7 (H) 11.5 - 15.5 %   Platelets 154 150 - 400 K/uL   nRBC 0.0 0.0 - 0.2 %    Comment: Performed at [facility], [place] [address] [place], [place] [address]  Creatinine, serum     Status: None   Collection Time: 09/04/19  2:27 PM  Result Value Ref Range   Creatinine, Ser 0.66 0.44 - 1.00 mg/dL   GFR calc non Af Amer >60 >60 mL/min   GFR calc Af Amer >60 >60 mL/min    Comment: Performed at [facility], [place] [address] [place], [place] [id number]  Basic metabolic panel     Status: Abnormal   Collection Time: 09/05/19  4:11 AM  Result Value Ref Range   Sodium 138 135 - 145 mmol/L   Potassium 4.4 3.5 - 5.1 mmol/L   Chloride 108 98 - 111  mmol/L   CO2 25 22 - 32 mmol/L   Glucose, Bld 113 (H) 70 - 99 mg/dL   BUN 6 (L) 8 - 23 mg/dL   Creatinine, Ser 0.58 0.44 - 1.00 mg/dL   Calcium 8.3 (L) 8.9 - 10.3 mg/dL   GFR calc non Af Amer >60 >60 mL/min   GFR calc Af Amer >60 >60 mL/min   Anion gap 5 5 - 15    Comment: Performed at [facility], [place] [address] [place], [place] [address]  CBC     Status: Abnormal   Collection Time: 09/05/19  4:11 AM  Result Value Ref Range   WBC 8.4 4.0 - 10.5 K/uL   RBC 3.39 (L) 3.87 - 5.11 MIL/uL   Hemoglobin 10.1 (L) 12.0 - 15.0 g/dL   HCT 32.6 (L) 36.0 - 46.0 %   MCV 96.2 80.0 - 100.0 fL   MCH 29.8 26.0 - 34.0 pg   MCHC 31.0 30.0 - 36.0 g/dL   RDW 15.9 (H) 11.5 - 15.5 %   Platelets 142 (L) 150 - 400 K/uL   nRBC 0.0 0.0 - 0.2 %    Comment: Performed at [facility],  [place] [address], [place], [place] [address]    Radiology/Results: No results found.  Anti-infectives: Anti-infectives (From admission, onward)   Start     Dose/Rate Route Frequency Ordered Stop   09/04/19 2000  cefoTEtan (CEFOTAN) 2 g in sodium chloride 0.9 % 100 mL IVPB     2 g 200 mL/hr over 30 Minutes Intravenous Every 12 hours 09/04/19 1338 09/04/19 2040   09/04/19 1400  neomycin (MYCIFRADIN) tablet 1,000 mg  Status:  Discontinued     1,000 mg Oral 3 times per day 09/04/19 0544 09/04/19 0546   09/04/19 1400  metroNIDAZOLE (FLAGYL) tablet 1,000 mg  Status:  Discontinued     1,000 mg Oral 3 times per day 09/04/19 0544 09/04/19 0546   09/04/19 0600  cefoTEtan (CEFOTAN) 2 g in sodium chloride 0.9 % 100 mL IVPB     2 g 200 mL/hr over 30 Minutes Intravenous On call to O.R. 09/04/19 0544 09/04/19 0802      Assessment/Plan: Problem List: Patient Active Problem List   Diagnosis Date Noted  . S/P colostomy takedown 09/04/2019  . Nausea 06/23/2019  . Abnormal LFTs 06/11/2019  . Acute blood loss anemia 06/11/2019  . Hypoxia 03/24/2019  . Hiccups 03/23/2019  . Colonic partial  obstruction from sigmoid stricture 03/18/2019  . Polyp of sigmoid colon 03/18/2019  . Oral thrush 03/18/2019  . Abnormal CT scan, gastrointestinal tract   . Abnormal CT scan, sigmoid colon   . 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 Diverticulitis of sigmoid colon 03/15/2019  . LLQ pain 11/02/2018  . Abdominal pain 09/21/2018  . Venous (peripheral) insufficiency 01/12/2014  . Sprain of ankle, unspecified site 06/28/2013  . Preventative health care 11/10/2011  . Cervical spondylosis 11/10/2011  . OA (osteoarthritis) of knee 11/10/2011  . COPD (chronic obstructive pulmonary disease) ([place]) 11/10/2011  . Dizziness 11/10/2011  . Benign neoplasm of colon 11/24/2010  . Diverticulosis of colon (without mention of hemorrhage) 11/24/2010  . Encounter for long-term (current) use of other medications 10/07/2010  . HLD (hyperlipidemia) 09/03/2010  . FATIGUE 09/03/2010  . SMOKER 09/10/2009    Lab ok today.  Will recheck in am.   1 Day Post-Op    LOS: 1 day   Matt B. Hassell Done, MD, [facility] [phone] beeper [phone]  09/05/2019 12:52 PM

## 2019-09-05 NOTE — Plan of Care (Signed)
Patient is hesitant to get out of bed because of severe pain upon moving. Patient verbalized understanding of pain management during the day and managing pain prior to activities. Patient was educated on importance of continuing to ambulate when possible. Patient's skin is dry and intact. Because the patient is in bed most of the time, should continue to monitor skin integrity for signs of breakdown or infection.

## 2019-09-05 NOTE — Plan of Care (Signed)
Patient has had difficulty with pain today. This morning her pain was 10/10; throughout the day patient's pain has been managed with hydrocodone and morphine. Medication brought patient's pain down to a 5. Patient stated ambulating is uncomfortable but not as painful as standing up, sitting down, or rolling to one side. Patient stated pain is 10/10 when she has to move. She was given an abdominal binder this morning; patient verbalized this helped with her pain. Patient's appetite is diminished. She has eaten 1/2 cup of jello and had ginger ale to drink. Will continue to encourage fluids, nutrition, and ambulating.

## 2019-09-05 NOTE — Progress Notes (Addendum)
Patient has c/o sharp pains in abdominal area. Pt mentioned that she was getting back in bed last night with the tech and due to not having a pillow behind her head she flopped back onto bed. Patient was sitting and according to pt the tech was helping her lay back by picking her legs up and she flopped back onto the bed. She now has pain that she thinks was caused by her flopping back onto the bed. Dawson Bills, RN

## 2019-09-06 NOTE — Progress Notes (Signed)
Patient ID: Theresa Arnold, female   DOB: 1931/09/16, 86 y.o.   MRN: [id number] [place] Progress Note:   2 Days Post-Op  Subjective: Mental status is clear Objective: Vital signs in last 24 hours: Temp:  [98.8 F (37.1 C)-98.9 F (37.2 C)] 98.8 F (37.1 C) (02/24 1331) Pulse Rate:  [64-70] 66 (02/24 1331) Resp:  [18] 18 (02/24 1331) BP: (112-145)/(52-76) 145/63 (02/24 1331) SpO2:  [97 %-99 %] 97 % (02/24 1331) Weight:  [52.4 kg] 52.4 kg (02/24 0647)  Intake/Output from previous day: 02/23 0701 - 02/24 0700 In: 3126.2 [P.O.:990; I.V.:2136.2] Out: 3200 [Urine:3200] Intake/Output this shift: No intake/output data recorded.  Physical Exam: Work of breathing is not labored.  Foley out and she had an episode of incontinence  Lab Results:  Results for orders placed or performed during the hospital encounter of 09/04/19 (from the past 48 hour(s))  Basic metabolic panel     Status: Abnormal   Collection Time: 09/05/19  4:11 AM  Result Value Ref Range   Sodium 138 135 - 145 mmol/L   Potassium 4.4 3.5 - 5.1 mmol/L   Chloride 108 98 - 111 mmol/L   CO2 25 22 - 32 mmol/L   Glucose, Bld 113 (H) 70 - 99 mg/dL   BUN 6 (L) 8 - 23 mg/dL   Creatinine, Ser 0.58 0.44 - 1.00 mg/dL   Calcium 8.3 (L) 8.9 - 10.3 mg/dL   GFR calc non Af Amer >60 >60 mL/min   GFR calc Af Amer >60 >60 mL/min   Anion gap 5 5 - 15    Comment: Performed at [facility], [place] [address], [place], [place] [address]  CBC     Status: Abnormal   Collection Time: 09/05/19  4:11 AM  Result Value Ref Range   WBC 8.4 4.0 - 10.5 K/uL   RBC 3.39 (L) 3.87 - 5.11 MIL/uL   Hemoglobin 10.1 (L) 12.0 - 15.0 g/dL   HCT 32.6 (L) 36.0 - 46.0 %   MCV 96.2 80.0 - 100.0 fL   MCH 29.8 26.0 - 34.0 pg   MCHC 31.0 30.0 - 36.0 g/dL   RDW 15.9 (H) 11.5 - 15.5 %   Platelets 142 (L) 150 - 400 K/uL   nRBC 0.0 0.0 - 0.2 %    Comment: Performed at [facility], [place] [address]  [place], [place] [address]    Radiology/Results: No results found.  Anti-infectives: Anti-infectives (From admission, onward)   Start     Dose/Rate Route Frequency Ordered Stop   09/04/19 2000  cefoTEtan (CEFOTAN) 2 g in sodium chloride 0.9 % 100 mL IVPB     2 g 200 mL/hr over 30 Minutes Intravenous Every 12 hours 09/04/19 1338 09/04/19 2040   09/04/19 1400  neomycin (MYCIFRADIN) tablet 1,000 mg  Status:  Discontinued     1,000 mg Oral 3 times per day 09/04/19 0544 09/04/19 0546   09/04/19 1400  metroNIDAZOLE (FLAGYL) tablet 1,000 mg  Status:  Discontinued     1,000 mg Oral 3 times per day 09/04/19 0544 09/04/19 0546   09/04/19 0600  cefoTEtan (CEFOTAN) 2 g in sodium chloride 0.9 % 100 mL IVPB     2 g 200 mL/hr over 30 Minutes Intravenous On call to O.R. 09/04/19 0544 09/04/19 0802      Assessment/Plan: Problem List: Patient Active Problem List   Diagnosis Date Noted  . S/P colostomy takedown 09/04/2019  . Nausea 06/23/2019  . Abnormal LFTs  06/11/2019  . Acute blood loss anemia 06/11/2019  . Hypoxia 03/24/2019  . Hiccups 03/23/2019  . Colonic partial obstruction from sigmoid stricture 03/18/2019  . Polyp of sigmoid colon 03/18/2019  . Oral thrush 03/18/2019  . Abnormal CT scan, gastrointestinal tract   . Abnormal CT scan, sigmoid colon   . 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 Diverticulitis of sigmoid colon 03/15/2019  . LLQ pain 11/02/2018  . Abdominal pain 09/21/2018  . Venous (peripheral) insufficiency 01/12/2014  . Sprain of ankle, unspecified site 06/28/2013  . Preventative health care 11/10/2011  . Cervical spondylosis 11/10/2011  . OA (osteoarthritis) of knee 11/10/2011  . COPD (chronic obstructive pulmonary disease) ([place]) 11/10/2011  . Dizziness 11/10/2011  . Benign neoplasm of colon 11/24/2010  . Diverticulosis of colon (without mention of hemorrhage) 11/24/2010  . Encounter for long-term (current) use of other medications 10/07/2010  . HLD (hyperlipidemia) 09/03/2010  .  FATIGUE 09/03/2010  . SMOKER 09/10/2009    Doing well thus far 2 Days Post-Op    LOS: 2 days   Matt B. Hassell Done, MD, [facility] [phone] beeper [phone]  09/06/2019 8:57 PM

## 2019-09-06 NOTE — Plan of Care (Signed)
  Problem: Clinical Measurements: Goal: Ability to maintain clinical measurements within normal limits will improve Outcome: Progressing   

## 2019-09-06 NOTE — Plan of Care (Signed)
  Problem: Clinical Measurements: Goal: Ability to maintain clinical measurements within normal limits will improve Outcome: Progressing   Problem: Clinical Measurements: Goal: Postoperative complications will be avoided or minimized Outcome: Progressing

## 2019-09-07 NOTE — Progress Notes (Signed)
Patient ID: Theresa Arnold, female   DOB: 1931-10-21, 86 y.o.   MRN: [id number] [place] [facility] Progress Note:   3 Days Post-Op  Subjective: Mental status is clear;  Sitting up in chair Objective: Vital signs in last 24 hours: Temp:  [98.2 F (36.8 C)-98.8 F (37.1 C)] 98.4 F (36.9 C) (02/25 0521) Pulse Rate:  [66-70] 70 (02/25 0521) Resp:  [18] 18 (02/25 0521) BP: (132-157)/(58-69) 132/58 (02/25 0521) SpO2:  [97 %-98 %] 97 % (02/25 0521) Weight:  [52.4 kg] 52.4 kg (02/25 0500)  Intake/Output from previous day: 02/24 0701 - 02/25 0700 In: 180 [P.O.:180] Out: 1000 [Urine:1000] Intake/Output this shift: No intake/output data recorded.  Physical Exam: Work of breathing is normal.  Passing flatus.  Lab Results:  No results found for this or any previous visit (from the past 48 hour(s)).  Radiology/Results: No results found.  Anti-infectives: Anti-infectives (From admission, onward)   Start     Dose/Rate Route Frequency Ordered Stop   09/04/19 2000  cefoTEtan (CEFOTAN) 2 g in sodium chloride 0.9 % 100 mL IVPB     2 g 200 mL/hr over 30 Minutes Intravenous Every 12 hours 09/04/19 1338 09/04/19 2040   09/04/19 1400  neomycin (MYCIFRADIN) tablet 1,000 mg  Status:  Discontinued     1,000 mg Oral 3 times per day 09/04/19 0544 09/04/19 0546   09/04/19 1400  metroNIDAZOLE (FLAGYL) tablet 1,000 mg  Status:  Discontinued     1,000 mg Oral 3 times per day 09/04/19 0544 09/04/19 0546   09/04/19 0600  cefoTEtan (CEFOTAN) 2 g in sodium chloride 0.9 % 100 mL IVPB     2 g 200 mL/hr over 30 Minutes Intravenous On call to O.R. 09/04/19 0544 09/04/19 0802      Assessment/Plan: Problem List: Patient Active Problem List   Diagnosis Date Noted  . S/P colostomy takedown 09/04/2019  . Nausea 06/23/2019  . Abnormal LFTs 06/11/2019  . Acute blood loss anemia 06/11/2019  . Hypoxia 03/24/2019  . Hiccups 03/23/2019  . Colonic partial obstruction from sigmoid stricture 03/18/2019  .  Polyp of sigmoid colon 03/18/2019  . Oral thrush 03/18/2019  . Abnormal CT scan, gastrointestinal tract   . Abnormal CT scan, sigmoid colon   . 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 Diverticulitis of sigmoid colon 03/15/2019  . LLQ pain 11/02/2018  . Abdominal pain 09/21/2018  . Venous (peripheral) insufficiency 01/12/2014  . Sprain of ankle, unspecified site 06/28/2013  . Preventative health care 11/10/2011  . Cervical spondylosis 11/10/2011  . OA (osteoarthritis) of knee 11/10/2011  . COPD (chronic obstructive pulmonary disease) ([place]) 11/10/2011  . Dizziness 11/10/2011  . Benign neoplasm of colon 11/24/2010  . Diverticulosis of colon (without mention of hemorrhage) 11/24/2010  . Encounter for long-term (current) use of other medications 10/07/2010  . HLD (hyperlipidemia) 09/03/2010  . FATIGUE 09/03/2010  . SMOKER 09/10/2009    Advance to full liquids.  Check lab in am.   3 Days Post-Op    LOS: 3 days   Matt B. Hassell Done, MD, [facility] [phone] beeper [phone]  09/07/2019 8:59 AM

## 2019-09-07 NOTE — Care Management Important Message (Signed)
Important Message  Patient Details IM Letter given to [place] Case Manager to present to the Patient Name: JALEAH PRIOR MRN: [id number] Date of Birth: 07-26-1931   Medicare Important Message Given:  Yes     Kerin Salen 09/07/2019, 1:54 PM

## 2019-09-07 NOTE — Plan of Care (Signed)
  Problem: Education: Goal: Required Educational Video(s) Outcome: Progressing   Problem: Clinical Measurements: Goal: Ability to maintain clinical measurements within normal limits will improve Outcome: Progressing Goal: Postoperative complications will be avoided or minimized Outcome: Progressing   Problem: Skin Integrity: Goal: Demonstration of wound healing without infection will improve Outcome: Progressing   Problem: Health Behavior/Discharge Planning: Goal: Ability to manage health-related needs will improve Outcome: Progressing

## 2019-09-08 NOTE — Progress Notes (Signed)
Patient ID: Theresa Arnold, female   DOB: February 20, 1932, 86 y.o.   MRN: [id number] [place] [facility] Progress Note:   4 Days Post-Op  Subjective: Mental status is clear Objective: Vital signs in last 24 hours: Temp:  [98.1 F (36.7 C)-98.3 F (36.8 C)] 98.1 F (36.7 C) (02/26 0535) Pulse Rate:  [68-81] 68 (02/26 0535) Resp:  [18-20] 18 (02/26 0535) BP: (118-135)/(62-69) 128/62 (02/26 0535) SpO2:  [97 %-100 %] 100 % (02/26 0535) Weight:  [51.4 kg] 51.4 kg (02/26 0624)  Intake/Output from previous day: 02/25 0701 - 02/26 0700 In: 1625 [P.O.:1625] Out: 1425 [Urine:1425] Intake/Output this shift: No intake/output data recorded.  Physical Exam: Work of breathing is normal.  Elza Rafter from ostomy closure removed.    Lab Results:  No results found for this or any previous visit (from the past 48 hour(s)).  Radiology/Results: No results found.  Anti-infectives: Anti-infectives (From admission, onward)   Start     Dose/Rate Route Frequency Ordered Stop   09/04/19 2000  cefoTEtan (CEFOTAN) 2 g in sodium chloride 0.9 % 100 mL IVPB     2 g 200 mL/hr over 30 Minutes Intravenous Every 12 hours 09/04/19 1338 09/04/19 2040   09/04/19 1400  neomycin (MYCIFRADIN) tablet 1,000 mg  Status:  Discontinued     1,000 mg Oral 3 times per day 09/04/19 0544 09/04/19 0546   09/04/19 1400  metroNIDAZOLE (FLAGYL) tablet 1,000 mg  Status:  Discontinued     1,000 mg Oral 3 times per day 09/04/19 0544 09/04/19 0546   09/04/19 0600  cefoTEtan (CEFOTAN) 2 g in sodium chloride 0.9 % 100 mL IVPB     2 g 200 mL/hr over 30 Minutes Intravenous On call to O.R. 09/04/19 0544 09/04/19 0802      Assessment/Plan: Problem List: Patient Active Problem List   Diagnosis Date Noted  . S/P colostomy takedown 09/04/2019  . Nausea 06/23/2019  . Abnormal LFTs 06/11/2019  . Acute blood loss anemia 06/11/2019  . Hypoxia 03/24/2019  . Hiccups 03/23/2019  . Colonic partial obstruction from sigmoid stricture 03/18/2019   . Polyp of sigmoid colon 03/18/2019  . Oral thrush 03/18/2019  . Abnormal CT scan, gastrointestinal tract   . Abnormal CT scan, sigmoid colon   . 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 Diverticulitis of sigmoid colon 03/15/2019  . LLQ pain 11/02/2018  . Abdominal pain 09/21/2018  . Venous (peripheral) insufficiency 01/12/2014  . Sprain of ankle, unspecified site 06/28/2013  . Preventative health care 11/10/2011  . Cervical spondylosis 11/10/2011  . OA (osteoarthritis) of knee 11/10/2011  . COPD (chronic obstructive pulmonary disease) ([place]) 11/10/2011  . Dizziness 11/10/2011  . Benign neoplasm of colon 11/24/2010  . Diverticulosis of colon (without mention of hemorrhage) 11/24/2010  . Encounter for long-term (current) use of other medications 10/07/2010  . HLD (hyperlipidemia) 09/03/2010  . FATIGUE 09/03/2010  . SMOKER 09/10/2009    Drinking.  Will advance diet.   4 Days Post-Op    LOS: 4 days   Matt B. Hassell Done, MD, [facility] [phone] beeper [phone]  09/08/2019 9:08 AM

## 2019-09-08 NOTE — Progress Notes (Signed)
Pharmacy Brief Note - Alvimopan (Entereg)  The standing order set for alvimopan (Entereg) now includes an automatic order to discontinue the drug after the patient has had a bowel movement. The change was approved by the Pharmacy & Therapeutics Committee and the Medical Executive Committee.  This patient has had a bowel movement documented by nursing. Therefore, alvimopan has been discontinued. If there are questions, please contact the pharmacy at [phone].  Thank you    

## 2019-09-08 NOTE — Progress Notes (Signed)
Patient 4x4 dressing at stoma removal site changed per Dr. Hassell Done order. Packing wick removed from wound by Dr. Hassell Done, and new 4x4 gauze and paper tape applied by this nurse. Dressing will need to be changed prn. Patient tolerated dressing change well. Call bell within reach.

## 2019-09-08 NOTE — Progress Notes (Signed)
Patient has ambulated in hallway multiple times today with one person assist.

## 2019-09-08 NOTE — Plan of Care (Signed)
  Problem: Education: Goal: Required Educational Video(s) Outcome: Progressing   Problem: Clinical Measurements: Goal: Ability to maintain clinical measurements within normal limits will improve Outcome: Progressing Goal: Postoperative complications will be avoided or minimized Outcome: Progressing   Problem: Skin Integrity: Goal: Demonstration of wound healing without infection will improve Outcome: Progressing   Problem: Health Behavior/Discharge Planning: Goal: Ability to manage health-related needs will improve Outcome: Progressing   Problem: Activity: Goal: Risk for activity intolerance will decrease Outcome: Progressing   Problem: Nutrition: Goal: Adequate nutrition will be maintained Outcome: Progressing   Problem: Elimination: Goal: Will not experience complications related to bowel motility Outcome: Progressing Goal: Will not experience complications related to urinary retention Outcome: Progressing   Problem: Pain Managment: Goal: General experience of comfort will improve Outcome: Progressing   Problem: Safety: Goal: Ability to remain free from injury will improve Outcome: Progressing   Problem: Education: Goal: Knowledge of General Education information will improve Description: Including pain rating scale, medication(s)/side effects and non-pharmacologic comfort measures Outcome: Progressing   Problem: Health Behavior/Discharge Planning: Goal: Ability to manage health-related needs will improve Outcome: Progressing   Problem: Clinical Measurements: Goal: Ability to maintain clinical measurements within normal limits will improve Outcome: Progressing Goal: Will remain free from infection Outcome: Progressing Goal: Diagnostic test results will improve Outcome: Progressing Goal: Cardiovascular complication will be avoided Outcome: Progressing   Problem: Activity: Goal: Risk for activity intolerance will decrease Outcome: Progressing   Problem:  Nutrition: Goal: Adequate nutrition will be maintained Outcome: Progressing   Problem: Coping: Goal: Level of anxiety will decrease Outcome: Progressing   Problem: Elimination: Goal: Will not experience complications related to bowel motility Outcome: Progressing Goal: Will not experience complications related to urinary retention Outcome: Progressing   Problem: Pain Managment: Goal: General experience of comfort will improve Outcome: Progressing   Problem: Safety: Goal: Ability to remain free from injury will improve Outcome: Progressing   Problem: Skin Integrity: Goal: Risk for impaired skin integrity will decrease Outcome: Progressing

## 2019-09-08 NOTE — Progress Notes (Signed)
Nausea at 19:10, then resolved for the rest of the shift.

## 2019-09-09 LAB — BASIC METABOLIC PANEL
Anion gap: 9 (ref 5–15)
BUN: 9 mg/dL (ref 8–23)
CO2: 26 mmol/L (ref 22–32)
Calcium: 8.7 mg/dL — ABNORMAL LOW (ref 8.9–10.3)
Chloride: 105 mmol/L (ref 98–111)
Creatinine, Ser: 0.5 mg/dL (ref 0.44–1.00)
GFR calc Af Amer: >60 mL/min (ref >60)
GFR calc non Af Amer: >60 mL/min (ref >60)
Glucose, Bld: 96 mg/dL (ref 70–99)
Potassium: 3.6 mmol/L (ref 3.5–5.1)
Sodium: 140 mmol/L (ref 135–145)

## 2019-09-09 LAB — CBC WITH DIFFERENTIAL/PLATELET
Abs Immature Granulocytes: 0.01 10*3/uL (ref 0.00–0.07)
Basophils Absolute: 0 10*3/uL (ref 0.0–0.1)
Basophils Relative: 1 %
Eosinophils Absolute: 0.2 10*3/uL (ref 0.0–0.5)
Eosinophils Relative: 4 %
HCT: 34.7 % — ABNORMAL LOW (ref 36.0–46.0)
Hemoglobin: 10.8 g/dL — ABNORMAL LOW (ref 12.0–15.0)
Immature Granulocytes: 0 %
Lymphocytes Relative: 35 %
Lymphs Abs: 1.8 10*3/uL (ref 0.7–4.0)
MCH: 29.5 pg (ref 26.0–34.0)
MCHC: 31.1 g/dL (ref 30.0–36.0)
MCV: 94.8 fL (ref 80.0–100.0)
Monocytes Absolute: 0.5 10*3/uL (ref 0.1–1.0)
Monocytes Relative: 9 %
Neutro Abs: 2.7 10*3/uL (ref 1.7–7.7)
Neutrophils Relative %: 51 %
Platelets: 164 10*3/uL (ref 150–400)
RBC: 3.66 MIL/uL — ABNORMAL LOW (ref 3.87–5.11)
RDW: 15.9 % — ABNORMAL HIGH (ref 11.5–15.5)
WBC: 5.2 10*3/uL (ref 4.0–10.5)
nRBC: 0 % (ref 0.0–0.2)

## 2019-09-09 MED ORDER — ENSURE ENLIVE PO LIQD: 237.0000 mL | Freq: Two times a day (BID) | ORAL | Status: DC

## 2019-09-09 NOTE — Progress Notes (Signed)
5 Days Post-Op   Subjective/Chief Complaint:  Weak  Pt not eating much  Bowels moving    Objective: Vital signs in last 24 hours: Temp:  [97.5 F (36.4 C)-98.6 F (37 C)] 97.5 F (36.4 C) (02/27 0443) Pulse Rate:  [68-82] 68 (02/27 0443) Resp:  [16-20] 16 (02/27 0443) BP: (112-128)/(60-80) 128/60 (02/27 0443) SpO2:  [97 %-98 %] 97 % (02/27 0443) Weight:  [51.3 kg] 51.3 kg (02/27 0500) Last BM Date: 09/08/19  Intake/Output from previous day: 02/26 0701 - 02/27 0700 In: 360 [P.O.:360] Out: 600 [Urine:600] Intake/Output this shift: No intake/output data recorded.  Incision/Wound:wounds CDI  Ostomy site clean with staples   Lab Results:  Recent Labs    09/09/19 0353  WBC 5.2  HGB 10.8*  HCT 34.7*  PLT 164   BMET Recent Labs    09/09/19 0353  NA 140  K 3.6  CL 105  CO2 26  GLUCOSE 96  BUN 9  CREATININE 0.50  CALCIUM 8.7*   PT/INR No results for input(s): LABPROT, INR in the last 72 hours. ABG No results for input(s): PHART, HCO3 in the last 72 hours.  Invalid input(s): PCO2, PO2  Studies/Results: No results found.  Anti-infectives: Anti-infectives (From admission, onward)   Start     Dose/Rate Route Frequency Ordered Stop   09/04/19 2000  cefoTEtan (CEFOTAN) 2 g in sodium chloride 0.9 % 100 mL IVPB     2 g 200 mL/hr over 30 Minutes Intravenous Every 12 hours 09/04/19 1338 09/04/19 2040   09/04/19 1400  neomycin (MYCIFRADIN) tablet 1,000 mg  Status:  Discontinued     1,000 mg Oral 3 times per day 09/04/19 0544 09/04/19 0546   09/04/19 1400  metroNIDAZOLE (FLAGYL) tablet 1,000 mg  Status:  Discontinued     1,000 mg Oral 3 times per day 09/04/19 0544 09/04/19 0546   09/04/19 0600  cefoTEtan (CEFOTAN) 2 g in sodium chloride 0.9 % 100 mL IVPB     2 g 200 mL/hr over 30 Minutes Intravenous On call to O.R. 09/04/19 0544 09/04/19 0802      Assessment/Plan: s/p Procedure(s): PRIMARY COLOCOLOSTOMY REVERSAL AND TAKEDOWN OF HARTMAN (N/A) Advance  diet Possibly discharge Sunday   LOS: 5 days    Joyice Faster Memori Sammon 09/09/2019

## 2019-09-09 NOTE — Plan of Care (Signed)
Plan of care for today discussed with patient.   Will continue to assess and monitor.     S[place]ittemore, Therapist, sports

## 2019-09-10 MED ORDER — HYDROCODONE-ACETAMINOPHEN 7.5-325 MG/15ML PO SOLN: 15.0000 mL | Freq: Four times a day (QID) | ORAL | 0 refills | Status: DC | PRN

## 2019-09-10 MED ORDER — ONDANSETRON HCL 4 MG PO TABS: 4.0000 mg | ORAL_TABLET | Freq: Every day | ORAL | 1 refills | Status: DC | PRN

## 2019-09-10 NOTE — Discharge Summary (Signed)
Physician Discharge Summary  Patient ID: ACHSAH GILYARD MRN: [id number] DOB/AGE: 04-11-1932 86 y.o.  Admit date: 09/04/2019 Discharge date: 09/10/2019  Admission Diagnoses:colostomy in place   Discharge Diagnoses:  Active Problems:   S/P colostomy takedown   Discharged Condition: good  Hospital Course: Pt did well after colostomy closure.  Her bowel function slowly returned.  Wounds were clean and she was tolerating a regular diet by POD 5.  She could ambulate as well safely.  Instructions provided to the patient.  HHN to be arranged as outpatient and follow up arranged   Consults: cardiology  Significant Diagnostic Studies: labs:  CBC    Component Value Date/Time   WBC 5.2 09/09/2019 0353   RBC 3.66 (L) 09/09/2019 0353   HGB 10.8 (L) 09/09/2019 0353   HCT 34.7 (L) 09/09/2019 0353   PLT 164 09/09/2019 0353   MCV 94.8 09/09/2019 0353   MCH 29.5 09/09/2019 0353   MCHC 31.1 09/09/2019 0353   RDW 15.9 (H) 09/09/2019 0353   LYMPHSABS 1.8 09/09/2019 0353   MONOABS 0.5 09/09/2019 0353   EOSABS 0.2 09/09/2019 0353   BASOSABS 0.0 09/09/2019 0353    Treatments: surgery: COLOSTOMY CLOSURE   Discharge Exam: Blood pressure 122/71, pulse 77, temperature 97.7 F (36.5 C), temperature source Oral, resp. rate 16, height 5\' 2"  (1.575 m), weight 51.3 kg, SpO2 97 %. General appearance: alert and cooperative Eyes: negative Resp: clear to auscultation bilaterally Cardio: regular rate and rhythm, S1, S2 normal, no murmur, click, rub or gallop Incision/Wound:CDI soft NT ND   Disposition: Discharge disposition: 01-Home or Self Care       Discharge Instructions    Diet - low sodium heart healthy   Complete by: As directed    Increase activity slowly   Complete by: As directed      Allergies as of 09/10/2019   No Known Allergies     Medication List    STOP taking these medications   acetaminophen 160 MG/5ML suspension Commonly known as: Tylenol Childrens   alum & mag  hydroxide-simeth 200-200-20 MG/5ML suspension Commonly known as: MAALOX/MYLANTA   chlorproMAZINE 10 MG tablet Commonly known as: THORAZINE   famotidine 20 MG tablet Commonly known as: Pepcid   ipratropium-albuterol 0.5-2.5 (3) MG/3ML Soln Commonly known as: DUONEB   magic mouthwash Soln   ondansetron 4 MG tablet Commonly known as: Zofran   ondansetron 8 MG disintegrating tablet Commonly known as: ZOFRAN-ODT   oxyCODONE 5 MG immediate release tablet Commonly known as: Roxicodone     TAKE these medications   HYDROcodone-acetaminophen 7.5-325 mg/15 ml solution Commonly known as: HYCET Take 15 mLs by mouth 4 (four) times daily as needed for moderate pain.        Signed: Joyice Faster Huntley Demedeiros 09/10/2019, 10:27 AM

## 2019-09-10 NOTE — Discharge Instructions (Signed)
[facility]      [facility] [phone]  OPEN ABDOMINAL SURGERY: POST OP INSTRUCTIONS  Always review your discharge instruction sheet given to you by the facility where your surgery was performed.  IF YOU HAVE DISABILITY OR FAMILY LEAVE FORMS, YOU MUST BRING THEM TO THE OFFICE FOR PROCESSING.  PLEASE DO NOT GIVE THEM TO YOUR DOCTOR.  1. A prescription for pain medication may be given to you upon discharge.  Take your pain medication as prescribed, if needed.  If narcotic pain medicine is not needed, then you may take acetaminophen (Tylenol) or ibuprofen (Advil) as needed. 2. Take your usually prescribed medications unless otherwise directed. 3. If you need a refill on your pain medication, please contact your pharmacy. They will contact our office to request authorization.  Prescriptions will not be filled after 5pm or on week-ends. 4. You should follow a light diet the first few days after arrival home, such as soup and crackers, pudding, etc.unless your doctor has advised otherwise. A high-fiber, low fat diet can be resumed as tolerated.   Be sure to include lots of fluids daily. Most patients will experience some swelling and bruising on the chest and neck area.  Ice packs will help.  Swelling and bruising can take several days to resolve 5. Most patients will experience some swelling and bruising in the area of the incision. Ice pack will help. Swelling and bruising can take several days to resolve..  6. It is common to experience some constipation if taking pain medication after surgery.  Increasing fluid intake and taking a stool softener will usually help or prevent this problem from occurring.  A mild laxative (Milk of Magnesia or Miralax) should be taken according to package directions if there are no bowel movements after 48 hours. 7.  You may have steri-strips (small skin tapes) in place directly over the incision.  These strips should be left on the skin for 7-10 days.  If your  surgeon used skin glue on the incision, you may shower in 24 hours.  The glue will flake off over the next 2-3 weeks.  Any sutures or staples will be removed at the office during your follow-up visit. You may find that a light gauze bandage over your incision may keep your staples from being rubbed or pulled. You may shower and replace the bandage daily. 8. ACTIVITIES:  You may resume regular (light) daily activities beginning the next day--such as daily self-care, walking, climbing stairs--gradually increasing activities as tolerated.  You may have sexual intercourse when it is comfortable.  Refrain from any heavy lifting or straining until approved by your doctor. a. You may drive when you no longer are taking prescription pain medication, you can comfortably wear a seatbelt, and you can safely maneuver your car and apply brakes b. Return to Work: ___________________________________ 9. You should see your doctor in the office for a follow-up appointment approximately two weeks after your surgery.  Make sure that you call for this appointment within a day or two after you arrive home to insure a convenient appointment time. OTHER INSTRUCTIONS:  _____________________________________________________________ _____________________________________________________________  WHEN TO CALL YOUR DOCTOR: 1. Fever over 101.0 2. Inability to urinate 3. Nausea and/or vomiting 4. Extreme swelling or bruising 5. Continued bleeding from incision. 6. Increased pain, redness, or drainage from the incision. 7. Difficulty swallowing or breathing 8. Muscle cramping or spasms. 9. Numbness or tingling in hands or feet or around lips.  The clinic staff is available to   answer your questions during regular business hours.  Please don't hesitate to call and ask to speak to one of the nurses if you have concerns.  For further questions, please visit [email or web address]   

## 2019-09-10 NOTE — Progress Notes (Signed)
Pt stable at time of d/c. No needs at time of d/c. No questions on d/c education or instructions given. Pt abdominal wounds within normal limits at time of discharge. Surgical office to follow up with pt on Monday for wound care and follow up appointment.

## 2019-09-12 DIAGNOSIS — Z48815 Encounter for surgical aftercare following surgery on the digestive system: Secondary | ICD-10-CM | POA: Diagnosis not present

## 2019-09-12 DIAGNOSIS — Z4801 Encounter for change or removal of surgical wound dressing: Secondary | ICD-10-CM | POA: Diagnosis not present

## 2019-09-14 DIAGNOSIS — Z48815 Encounter for surgical aftercare following surgery on the digestive system: Secondary | ICD-10-CM | POA: Diagnosis not present

## 2019-09-14 DIAGNOSIS — Z4801 Encounter for change or removal of surgical wound dressing: Secondary | ICD-10-CM | POA: Diagnosis not present

## 2019-09-27 DIAGNOSIS — H353121 Nonexudative age-related macular degeneration, left eye, early dry stage: Secondary | ICD-10-CM | POA: Diagnosis not present

## 2019-09-27 DIAGNOSIS — H01134 Eczematous dermatitis of left upper eyelid: Secondary | ICD-10-CM | POA: Diagnosis not present

## 2019-09-27 DIAGNOSIS — H353211 Exudative age-related macular degeneration, right eye, with active choroidal neovascularization: Secondary | ICD-10-CM | POA: Diagnosis not present

## 2019-09-27 DIAGNOSIS — Z961 Presence of intraocular lens: Secondary | ICD-10-CM | POA: Diagnosis not present

## 2019-09-27 DIAGNOSIS — H01131 Eczematous dermatitis of right upper eyelid: Secondary | ICD-10-CM | POA: Diagnosis not present

## 2019-09-27 DIAGNOSIS — H26493 Other secondary cataract, bilateral: Secondary | ICD-10-CM | POA: Diagnosis not present

## 2019-10-04 DIAGNOSIS — H26493 Other secondary cataract, bilateral: Secondary | ICD-10-CM | POA: Diagnosis not present

## 2019-10-05 ENCOUNTER — Encounter (INDEPENDENT_AMBULATORY_CARE_PROVIDER_SITE_OTHER): Payer: Medicare Other | Admitting: Ophthalmology

## 2019-10-05 DIAGNOSIS — H3509 Other intraretinal microvascular abnormalities: Secondary | ICD-10-CM | POA: Diagnosis not present

## 2019-10-05 DIAGNOSIS — H353122 Nonexudative age-related macular degeneration, left eye, intermediate dry stage: Secondary | ICD-10-CM

## 2019-10-05 DIAGNOSIS — H353211 Exudative age-related macular degeneration, right eye, with active choroidal neovascularization: Secondary | ICD-10-CM | POA: Diagnosis not present

## 2019-10-05 DIAGNOSIS — H43813 Vitreous degeneration, bilateral: Secondary | ICD-10-CM | POA: Diagnosis not present

## 2019-10-17 DIAGNOSIS — H26491 Other secondary cataract, right eye: Secondary | ICD-10-CM | POA: Diagnosis not present

## 2019-11-02 ENCOUNTER — Encounter (INDEPENDENT_AMBULATORY_CARE_PROVIDER_SITE_OTHER): Payer: Medicare Other | Admitting: Ophthalmology

## 2019-11-02 DIAGNOSIS — I10 Essential (primary) hypertension: Secondary | ICD-10-CM | POA: Diagnosis not present

## 2019-11-02 DIAGNOSIS — H35033 Hypertensive retinopathy, bilateral: Secondary | ICD-10-CM | POA: Diagnosis not present

## 2019-11-02 DIAGNOSIS — H353211 Exudative age-related macular degeneration, right eye, with active choroidal neovascularization: Secondary | ICD-10-CM

## 2019-11-02 DIAGNOSIS — H3509 Other intraretinal microvascular abnormalities: Secondary | ICD-10-CM

## 2019-11-02 DIAGNOSIS — H43813 Vitreous degeneration, bilateral: Secondary | ICD-10-CM

## 2019-11-13 ENCOUNTER — Other Ambulatory Visit: Payer: Self-pay

## 2019-11-13 ENCOUNTER — Encounter (INDEPENDENT_AMBULATORY_CARE_PROVIDER_SITE_OTHER): Payer: Medicare Other | Admitting: Ophthalmology

## 2019-11-13 DIAGNOSIS — H3509 Other intraretinal microvascular abnormalities: Secondary | ICD-10-CM

## 2019-11-30 ENCOUNTER — Encounter (INDEPENDENT_AMBULATORY_CARE_PROVIDER_SITE_OTHER): Payer: Medicare Other | Admitting: Ophthalmology

## 2019-11-30 ENCOUNTER — Other Ambulatory Visit: Payer: Self-pay

## 2019-11-30 DIAGNOSIS — H353211 Exudative age-related macular degeneration, right eye, with active choroidal neovascularization: Secondary | ICD-10-CM

## 2019-11-30 DIAGNOSIS — H43813 Vitreous degeneration, bilateral: Secondary | ICD-10-CM

## 2019-11-30 DIAGNOSIS — I10 Essential (primary) hypertension: Secondary | ICD-10-CM | POA: Diagnosis not present

## 2019-11-30 DIAGNOSIS — H35033 Hypertensive retinopathy, bilateral: Secondary | ICD-10-CM | POA: Diagnosis not present

## 2020-01-11 ENCOUNTER — Other Ambulatory Visit: Payer: Self-pay

## 2020-01-11 ENCOUNTER — Encounter (INDEPENDENT_AMBULATORY_CARE_PROVIDER_SITE_OTHER): Payer: Medicare Other | Admitting: Ophthalmology

## 2020-01-11 DIAGNOSIS — H353211 Exudative age-related macular degeneration, right eye, with active choroidal neovascularization: Secondary | ICD-10-CM | POA: Diagnosis not present

## 2020-01-11 DIAGNOSIS — H35033 Hypertensive retinopathy, bilateral: Secondary | ICD-10-CM

## 2020-01-11 DIAGNOSIS — I10 Essential (primary) hypertension: Secondary | ICD-10-CM | POA: Diagnosis not present

## 2020-01-11 DIAGNOSIS — H43813 Vitreous degeneration, bilateral: Secondary | ICD-10-CM

## 2020-02-02 DIAGNOSIS — L249 Irritant contact dermatitis, unspecified cause: Secondary | ICD-10-CM | POA: Diagnosis not present

## 2020-02-28 ENCOUNTER — Telehealth: Payer: Self-pay | Admitting: Internal Medicine

## 2020-02-28 NOTE — Telephone Encounter (Signed)
F/u    Calling back on previous messages aware the MD is still in clinic.

## 2020-02-28 NOTE — Telephone Encounter (Signed)
Please be aware MD is not obligated to call any patient back immediately despite being physically in the office  This is a non urgent message as well  Pt should have a booster shot of the vaccine at 8 months after the last vaccination

## 2020-02-28 NOTE — Telephone Encounter (Signed)
New message:   Pt is calling and would like to know if she should get the 3rd booster vaccination. She states they are asking people to confirm they need it with their pcp. Please advise.

## 2020-03-07 ENCOUNTER — Encounter (INDEPENDENT_AMBULATORY_CARE_PROVIDER_SITE_OTHER): Payer: Medicare Other | Admitting: Ophthalmology

## 2020-03-07 ENCOUNTER — Other Ambulatory Visit: Payer: Self-pay

## 2020-03-07 DIAGNOSIS — H35033 Hypertensive retinopathy, bilateral: Secondary | ICD-10-CM

## 2020-03-07 DIAGNOSIS — H43813 Vitreous degeneration, bilateral: Secondary | ICD-10-CM | POA: Diagnosis not present

## 2020-03-07 DIAGNOSIS — H353211 Exudative age-related macular degeneration, right eye, with active choroidal neovascularization: Secondary | ICD-10-CM

## 2020-03-07 DIAGNOSIS — H3509 Other intraretinal microvascular abnormalities: Secondary | ICD-10-CM | POA: Diagnosis not present

## 2020-03-07 DIAGNOSIS — H3581 Retinal edema: Secondary | ICD-10-CM | POA: Diagnosis not present

## 2020-03-07 DIAGNOSIS — I10 Essential (primary) hypertension: Secondary | ICD-10-CM

## 2020-03-11 DIAGNOSIS — L218 Other seborrheic dermatitis: Secondary | ICD-10-CM | POA: Diagnosis not present

## 2020-03-11 DIAGNOSIS — L810 Postinflammatory hyperpigmentation: Secondary | ICD-10-CM | POA: Diagnosis not present

## 2020-04-16 DIAGNOSIS — L218 Other seborrheic dermatitis: Secondary | ICD-10-CM | POA: Diagnosis not present

## 2020-05-01 ENCOUNTER — Encounter (INDEPENDENT_AMBULATORY_CARE_PROVIDER_SITE_OTHER): Payer: Medicare Other | Admitting: Ophthalmology

## 2020-05-02 ENCOUNTER — Encounter (INDEPENDENT_AMBULATORY_CARE_PROVIDER_SITE_OTHER): Payer: Medicare Other | Admitting: Ophthalmology

## 2020-05-09 ENCOUNTER — Encounter (INDEPENDENT_AMBULATORY_CARE_PROVIDER_SITE_OTHER): Payer: Medicare Other | Admitting: Ophthalmology

## 2020-05-09 ENCOUNTER — Other Ambulatory Visit: Payer: Self-pay

## 2020-05-09 DIAGNOSIS — H3509 Other intraretinal microvascular abnormalities: Secondary | ICD-10-CM | POA: Diagnosis not present

## 2020-05-09 DIAGNOSIS — H353211 Exudative age-related macular degeneration, right eye, with active choroidal neovascularization: Secondary | ICD-10-CM | POA: Diagnosis not present

## 2020-05-09 DIAGNOSIS — H43813 Vitreous degeneration, bilateral: Secondary | ICD-10-CM

## 2020-07-04 ENCOUNTER — Encounter (INDEPENDENT_AMBULATORY_CARE_PROVIDER_SITE_OTHER): Payer: Medicare Other | Admitting: Ophthalmology

## 2020-07-17 ENCOUNTER — Encounter (HOSPITAL_COMMUNITY): Payer: Self-pay

## 2020-07-17 ENCOUNTER — Emergency Department (HOSPITAL_COMMUNITY)
Admission: EM | Admit: 2020-07-17 | Discharge: 2020-07-17 | Disposition: A | Discharge status: Home / Self Care | Payer: Medicare Other | Source: Home / Self Care | Attending: Emergency Medicine | Admitting: Emergency Medicine

## 2020-07-17 ENCOUNTER — Other Ambulatory Visit: Payer: Self-pay

## 2020-07-17 DIAGNOSIS — R1111 Vomiting without nausea: Secondary | ICD-10-CM | POA: Diagnosis not present

## 2020-07-17 DIAGNOSIS — Z7982 Long term (current) use of aspirin: Secondary | ICD-10-CM | POA: Insufficient documentation

## 2020-07-17 DIAGNOSIS — Z87891 Personal history of nicotine dependence: Secondary | ICD-10-CM | POA: Insufficient documentation

## 2020-07-17 DIAGNOSIS — R112 Nausea with vomiting, unspecified: Secondary | ICD-10-CM | POA: Insufficient documentation

## 2020-07-17 DIAGNOSIS — R110 Nausea: Secondary | ICD-10-CM | POA: Diagnosis not present

## 2020-07-17 DIAGNOSIS — D6959 Other secondary thrombocytopenia: Secondary | ICD-10-CM | POA: Diagnosis not present

## 2020-07-17 DIAGNOSIS — R58 Hemorrhage, not elsewhere classified: Secondary | ICD-10-CM | POA: Diagnosis not present

## 2020-07-17 DIAGNOSIS — Z20822 Contact with and (suspected) exposure to covid-19: Secondary | ICD-10-CM | POA: Insufficient documentation

## 2020-07-17 DIAGNOSIS — K565 Intestinal adhesions [bands], unspecified as to partial versus complete obstruction: Secondary | ICD-10-CM | POA: Diagnosis not present

## 2020-07-17 DIAGNOSIS — E785 Hyperlipidemia, unspecified: Secondary | ICD-10-CM | POA: Diagnosis not present

## 2020-07-17 DIAGNOSIS — I10 Essential (primary) hypertension: Secondary | ICD-10-CM | POA: Diagnosis not present

## 2020-07-17 DIAGNOSIS — J449 Chronic obstructive pulmonary disease, unspecified: Secondary | ICD-10-CM | POA: Insufficient documentation

## 2020-07-17 DIAGNOSIS — K56609 Unspecified intestinal obstruction, unspecified as to partial versus complete obstruction: Secondary | ICD-10-CM | POA: Diagnosis not present

## 2020-07-17 DIAGNOSIS — E876 Hypokalemia: Secondary | ICD-10-CM | POA: Diagnosis not present

## 2020-07-17 DIAGNOSIS — E43 Unspecified severe protein-calorie malnutrition: Secondary | ICD-10-CM | POA: Diagnosis not present

## 2020-07-17 DIAGNOSIS — K5730 Diverticulosis of large intestine without perforation or abscess without bleeding: Secondary | ICD-10-CM | POA: Diagnosis not present

## 2020-07-17 DIAGNOSIS — I700 Atherosclerosis of aorta: Secondary | ICD-10-CM | POA: Diagnosis not present

## 2020-07-17 LAB — COMPREHENSIVE METABOLIC PANEL
ALT: 34 U/L (ref 0–44)
AST: 66 U/L — ABNORMAL HIGH (ref 15–41)
Albumin: 4.2 g/dL (ref 3.5–5.0)
Alkaline Phosphatase: 57 U/L (ref 38–126)
Anion gap: 16 — ABNORMAL HIGH (ref 5–15)
BUN: 26 mg/dL — ABNORMAL HIGH (ref 8–23)
CO2: 26 mmol/L (ref 22–32)
Calcium: 9.5 mg/dL (ref 8.9–10.3)
Chloride: 98 mmol/L (ref 98–111)
Creatinine, Ser: 0.88 mg/dL (ref 0.44–1.00)
GFR, Estimated: >60 mL/min (ref >60)
Glucose, Bld: 121 mg/dL — ABNORMAL HIGH (ref 70–99)
Potassium: 4.1 mmol/L (ref 3.5–5.1)
Sodium: 140 mmol/L (ref 135–145)
Total Bilirubin: 1.1 mg/dL (ref 0.3–1.2)
Total Protein: 8.2 g/dL — ABNORMAL HIGH (ref 6.5–8.1)

## 2020-07-17 LAB — CBC WITH DIFFERENTIAL/PLATELET
Abs Immature Granulocytes: 0.02 10*3/uL (ref 0.00–0.07)
Basophils Absolute: 0 10*3/uL (ref 0.0–0.1)
Basophils Relative: 0 %
Eosinophils Absolute: 0.8 10*3/uL — ABNORMAL HIGH (ref 0.0–0.5)
Eosinophils Relative: 9 %
HCT: 46.4 % — ABNORMAL HIGH (ref 36.0–46.0)
Hemoglobin: 15.6 g/dL — ABNORMAL HIGH (ref 12.0–15.0)
Immature Granulocytes: 0 %
Lymphocytes Relative: 9 %
Lymphs Abs: 0.7 10*3/uL (ref 0.7–4.0)
MCH: 36.4 pg — ABNORMAL HIGH (ref 26.0–34.0)
MCHC: 33.6 g/dL (ref 30.0–36.0)
MCV: 108.4 fL — ABNORMAL HIGH (ref 80.0–100.0)
Monocytes Absolute: 0.5 10*3/uL (ref 0.1–1.0)
Monocytes Relative: 6 %
Neutro Abs: 6.5 10*3/uL (ref 1.7–7.7)
Neutrophils Relative %: 76 %
Platelets: 136 10*3/uL — ABNORMAL LOW (ref 150–400)
RBC: 4.28 MIL/uL (ref 3.87–5.11)
RDW: 14.5 % (ref 11.5–15.5)
WBC: 8.6 10*3/uL (ref 4.0–10.5)
nRBC: 0 % (ref 0.0–0.2)

## 2020-07-17 LAB — RESP PANEL BY RT-PCR (FLU A&B, COVID) ARPGX2
Influenza A by PCR: NEGATIVE
Influenza B by PCR: NEGATIVE
SARS Coronavirus 2 by RT PCR: NEGATIVE

## 2020-07-17 LAB — BLOOD GAS, VENOUS
Acid-Base Excess: 4.1 mmol/L — ABNORMAL HIGH (ref 0.0–2.0)
Bicarbonate: 29 mmol/L — ABNORMAL HIGH (ref 20.0–28.0)
O2 Saturation: 50.3 %
Patient temperature: 98.6
pCO2, Ven: 46.1 mmHg (ref 44.0–60.0)
pH, Ven: 7.415 (ref 7.250–7.430)
pO2, Ven: <31 mmHg — CL (ref 32.0–45.0)

## 2020-07-17 LAB — LIPASE, BLOOD: Lipase: 22 U/L (ref 11–51)

## 2020-07-17 MED ORDER — ONDANSETRON HCL 4 MG/2ML IJ SOLN
4.0000 mg | Freq: Once | INTRAMUSCULAR | Status: AC
  Administered 2020-07-17: 4 mg via INTRAVENOUS
  Filled 2020-07-17: qty 2

## 2020-07-17 MED ORDER — SODIUM CHLORIDE 0.9 % IV BOLUS
500.0000 mL | Freq: Once | INTRAVENOUS | Status: AC
  Administered 2020-07-17: 500 mL via INTRAVENOUS

## 2020-07-17 MED ORDER — ONDANSETRON HCL 4 MG PO TABS: 4.0000 mg | ORAL_TABLET | Freq: Four times a day (QID) | ORAL | 1 refills | Status: AC | PRN

## 2020-07-17 NOTE — ED Notes (Signed)
Date and time results received: 07/17/20 9:59 AM Test: VBG 02 Critical Value: <31  Name of Provider Notified: Effie Shy  Orders Received? Or Actions Taken?:

## 2020-07-17 NOTE — ED Notes (Signed)
Given PO food and fluid.

## 2020-07-17 NOTE — ED Provider Notes (Signed)
[name] Provider Note   CSN: [name] Arrival date & time: 07/17/20  0801     History Chief Complaint  Patient presents with  . Hematemesis    Theresa Arnold is a 87 y.o. female.  HPI She presents complaining of vomiting biomaterial for 2 days. She denies abdominal pain, weakness or dizziness. She has been previously treated and had an ostomy, for a sigmoid colon stricture, associated with diverticulitis. She denies fever, chills, cough, shortness of breath, abdominal or back pain, weakness or dizziness. No known sick contacts. There are no other known modifying factors.  Past Medical History:  Diagnosis Date  . CERVICAL RADICULOPATHY, LEFT 09/03/2010  . Cervical spondylosis 11/10/2011  . COPD (chronic obstructive pulmonary disease) ([place]) 11/10/2011  . Diverticulitis   . HYPERLIPIDEMIA 09/03/2010    Patient Active Problem List   Diagnosis Date Noted  . S/P colostomy takedown 09/04/2019  . Nausea 06/23/2019  . Abnormal LFTs 06/11/2019  . Acute blood loss anemia 06/11/2019  . Hypoxia 03/24/2019  . Hiccups 03/23/2019  . Colonic partial obstruction from sigmoid stricture 03/18/2019  . Polyp of sigmoid colon 03/18/2019  . Oral thrush 03/18/2019  . Abnormal CT scan, gastrointestinal tract   . Abnormal CT scan, sigmoid colon   . 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 Diverticulitis of sigmoid colon 03/15/2019  . LLQ pain 11/02/2018  . Abdominal pain 09/21/2018  . Venous (peripheral) insufficiency 01/12/2014  . Sprain of ankle, unspecified site 06/28/2013  . Preventative health care 11/10/2011  . Cervical spondylosis 11/10/2011  . OA (osteoarthritis) of knee 11/10/2011  . COPD (chronic obstructive pulmonary disease) ([place]) 11/10/2011  . Dizziness 11/10/2011  . Benign neoplasm of colon 11/24/2010  . Diverticulosis of colon (without mention of hemorrhage) 11/24/2010  . Encounter for long-term (current) use of other medications 10/07/2010  . HLD  (hyperlipidemia) 09/03/2010  . FATIGUE 09/03/2010  . SMOKER 09/10/2009    Past Surgical History:  Procedure Laterality Date  . COLONOSCOPY    . COLOSTOMY REVERSAL N/A 09/04/2019   Procedure: PRIMARY COLOCOLOSTOMY REVERSAL AND TAKEDOWN OF HARTMAN;  Surgeon: [name], MD;  Location: [facility] ORS;  Service: General;  Laterality: N/A;  . EYE SURGERY     bilateral cataracts with lens implants  . LAPAROSCOPIC PARTIAL COLECTOMY N/A 03/22/2019   Procedure: LAPAROSCOPY, SPLENIC FLEXURE MOBILIZATION WITH OPEN HARTMANN PROCEDURE;  Surgeon: [name], MD;  Location: [facility] ORS;  Service: General;  Laterality: N/A;     OB History   No obstetric history on file.     Family History  Problem Relation Age of Onset  . Colon cancer Sister   . Breast cancer Sister   . Heart disease Father   . Hip fracture Mother   . Hypertension Other     Social History   Tobacco Use  . Smoking status: Former Smoker    Packs/day: 1.00    Years: 30.00    Pack years: 30.00    Types: Cigarettes    Quit date: 07/14/2011    Years since quitting: 9.0  . Smokeless tobacco: Never Used  . Tobacco comment: QUIT 2013   Vaping Use  . Vaping Use: Never used  Substance Use Topics  . Alcohol use: Yes    Comment: wine occasionally  . Drug use: No    Home Medications Prior to Admission medications   Medication Sig Start Date End Date Taking? Authorizing Provider  acetaminophen (TYLENOL) 650 MG CR tablet Take 1,300 mg by mouth every 8 (eight)  hours as needed for pain.   Yes [provider]  aspirin 325 MG tablet Take 325 mg by mouth every 6 (six) hours as needed for mild pain.   Yes [provider]  HYDROcodone-acetaminophen (HYCET) 7.5-325 mg/15 ml solution Take 15 mLs by mouth 4 (four) times daily as needed for moderate pain. Patient not taking: Reported on 07/17/2020 09/10/19 09/09/20  [name], MD  ondansetron (ZOFRAN) 4 MG tablet Take 1 tablet (4 mg total) by mouth every 6 (six) hours as  needed for nausea or vomiting. 07/17/20 07/17/21  [name], MD    Allergies    Patient has no known allergies.  Review of Systems   Review of Systems  All other systems reviewed and are negative.   Physical Exam Updated Vital Signs BP 131/84   Pulse 80   Temp 98.3 F (36.8 C) (Oral)   Resp 18   SpO2 99%   Physical Exam Vitals and nursing note reviewed.  Constitutional:      General: She is not in acute distress.    Appearance: She is well-developed and well-nourished. She is not ill-appearing, toxic-appearing or diaphoretic.     Comments: Frail, elderly  HENT:     Head: Normocephalic and atraumatic.     Right Ear: External ear normal.     Left Ear: External ear normal.  Eyes:     Extraocular Movements: EOM normal.     Conjunctiva/sclera: Conjunctivae normal.     Pupils: Pupils are equal, round, and reactive to light.  Neck:     Trachea: Phonation normal.  Cardiovascular:     Rate and Rhythm: Normal rate and regular rhythm.     Heart sounds: Normal heart sounds.  Pulmonary:     Effort: Pulmonary effort is normal. No respiratory distress.     Breath sounds: Normal breath sounds. No stridor.  Chest:     Chest wall: No bony tenderness.  Abdominal:     General: There is no distension.     Palpations: Abdomen is soft. There is no mass.     Tenderness: There is no abdominal tenderness. There is no guarding.     Hernia: No hernia is present.     Comments: Scattered well-healed abdominal scarring  Musculoskeletal:        General: Normal range of motion.     Cervical back: Normal range of motion and neck supple.  Skin:    General: Skin is warm, dry and intact.  Neurological:     Mental Status: She is alert and oriented to person, place, and time.     Cranial Nerves: No cranial nerve deficit.     Sensory: No sensory deficit.     Motor: No abnormal muscle tone.     Coordination: Coordination normal.  Psychiatric:        Mood and Affect: Mood and affect and mood  normal.        Behavior: Behavior normal.        Thought Content: Thought content normal.        Judgment: Judgment normal.     ED Results / Procedures / Treatments   Labs (all labs ordered are listed, but only abnormal results are displayed) Labs Reviewed  CBC WITH DIFFERENTIAL/PLATELET - Abnormal; Notable for the following components:      Result Value   Hemoglobin 15.6 (*)    HCT 46.4 (*)    MCV 108.4 (*)    MCH 36.4 (*)    Platelets 136 (*)  Eosinophils Absolute 0.8 (*)    All other components within normal limits  BLOOD GAS, VENOUS - Abnormal; Notable for the following components:   pO2, Ven <31.0 (*)    Bicarbonate 29.0 (*)    Acid-Base Excess 4.1 (*)    All other components within normal limits  COMPREHENSIVE METABOLIC PANEL - Abnormal; Notable for the following components:   Glucose, Bld 121 (*)    BUN 26 (*)    Total Protein 8.2 (*)    AST 66 (*)    Anion gap 16 (*)    All other components within normal limits  RESP PANEL BY RT-PCR (FLU A&B, COVID) ARPGX2  LIPASE, BLOOD  POC OCCULT BLOOD, ED  SAMPLE TO BLOOD BANK    EKG None  Radiology No results found.  Procedures Procedures (including critical care time)  Medications Ordered in ED Medications  sodium chloride 0.9 % bolus 500 mL (0 mLs Intravenous Stopped 07/17/20 1045)  ondansetron (ZOFRAN) injection 4 mg (4 mg Intravenous Given 07/17/20 0943)  ondansetron (ZOFRAN) injection 4 mg (4 mg Intravenous Given 07/17/20 1248)  sodium chloride 0.9 % bolus 500 mL (0 mLs Intravenous Stopped 07/17/20 1349)    ED Course  I have reviewed the triage vital signs and the nursing notes.  Pertinent labs & imaging results that were available during my care of the patient were reviewed by me and considered in my medical decision making (see chart for details).    MDM Rules/Calculators/A&P                           Patient Vitals for the past 24 hrs:  BP Temp Temp src Pulse Resp SpO2  07/17/20 1447 131/84 - - 80 18  99 %  07/17/20 1345 132/69 - - 87 18 100 %  07/17/20 1300 (!) 134/53 - - 90 (!) 21 99 %  07/17/20 1230 110/85 - - 84 20 95 %  07/17/20 1130 (!) 143/64 - - 82 19 95 %  07/17/20 1017 (!) 139/55 - - 78 (!) 23 99 %  07/17/20 0924 - - - 78 20 99 %  07/17/20 0905 (!) 157/79 - - 81 (!) 26 98 %  07/17/20 0806 (!) 149/76 98.3 F (36.8 C) Oral 86 18 96 %    2:39 PM Reevaluation with update and discussion. After initial assessment and treatment, an updated evaluation reveals she has not vomited since she is now here she has no further complaints.  She is nonambulatory to the bathroom and back.  Findings discussed with the patient and all questions were answered. Mancel Bale   Medical Decision Making:  This patient is presenting for evaluation of vomiting dark material, which does require a range of treatment options, and is a complaint that involves a moderate risk of morbidity and mortality. The differential diagnoses include gastritis, enteritis, peptic ulcer disease, esophagitis. I decided to review old records, and in summary elderly female presenting with vomiting, reported dark material.  I did not require additional historical information from anyone.  Clinical Laboratory Tests Ordered, included CBC, Metabolic panel and Lipase, VBG. Review indicates hemoglobin high, MCV high, glucose high, BUN high, total protein high, AST high, pH normal.  Critical Interventions-clinical evaluation, laboratory testing, IV fluids, Zofran, 2 doses, observation reassessment.  No vomiting in the ED  After These Interventions, the Patient was reevaluated and was found stable for discharge.  Nonspecific vomiting.  No evidence for significant blood loss.  Suspect mild  dehydration.  Patient treated with IV fluids in the ED.  No indication for further ED evaluation or hospitalization, at this time.  CRITICAL CARE-no Performed by: [name]  Nursing Notes Reviewed/ Care Coordinated Applicable Imaging  Reviewed Interpretation of Laboratory Data incorporated into ED treatment  The patient appears reasonably screened and/or stabilized for discharge and I doubt any other medical condition or other [facility] requiring further screening, evaluation, or treatment in the ED at this time prior to discharge.  Plan: Home Medications-continue usual; Home Treatments-gradual advance diet; return here if the recommended treatment, does not improve the symptoms; Recommended follow up-CP checkup next week and as needed     Final Clinical Impression(s) / ED Diagnoses Final diagnoses:  Non-intractable vomiting with nausea, unspecified vomiting type    Rx / DC Orders ED Discharge Orders         Ordered    ondansetron (ZOFRAN) 4 MG tablet  Every 6 hours PRN        07/17/20 1441           [name], MD 07/17/20 1630

## 2020-07-17 NOTE — Discharge Instructions (Addendum)
Start with a clear liquid diet then gradually advance to regular foods after a day or 2.  Call your doctor for follow-up appointment next week.

## 2020-07-17 NOTE — ED Triage Notes (Addendum)
Pt arrived via EMS, vomiting x2, pt states she noticed some "black substance" in her emesis she believes is dark blood. Denies any abd pain, urinary sx or blood in stool. States she had a colostomy x2 years ago that was revered after she no longer needed it, states she believes this could be a complication of such.

## 2020-07-17 NOTE — ED Notes (Signed)
Pt states she is still very nauseous, unable to keep down food/drink. Dr. Effie Shy made aware.

## 2020-07-17 NOTE — ED Notes (Signed)
An After Visit Summary was printed and given to the patient. Discharge instructions given and no further questions at this time.  Pt leaving with family.  

## 2020-07-18 ENCOUNTER — Encounter (INDEPENDENT_AMBULATORY_CARE_PROVIDER_SITE_OTHER): Payer: Medicare Other | Admitting: Ophthalmology

## 2020-07-19 ENCOUNTER — Encounter (HOSPITAL_COMMUNITY): Payer: Self-pay

## 2020-07-19 ENCOUNTER — Telehealth: Payer: Self-pay | Admitting: Emergency Medicine

## 2020-07-19 ENCOUNTER — Inpatient Hospital Stay (HOSPITAL_COMMUNITY)
Admission: EM | Admit: 2020-07-19 | Discharge: 2020-08-04 | DRG: 335 | Disposition: A | Discharge status: Home Health | Payer: Medicare Other | Attending: Internal Medicine | Admitting: Internal Medicine

## 2020-07-19 ENCOUNTER — Other Ambulatory Visit: Payer: Self-pay

## 2020-07-19 DIAGNOSIS — E785 Hyperlipidemia, unspecified: Secondary | ICD-10-CM | POA: Diagnosis present

## 2020-07-19 DIAGNOSIS — K567 Ileus, unspecified: Secondary | ICD-10-CM | POA: Diagnosis not present

## 2020-07-19 DIAGNOSIS — D539 Nutritional anemia, unspecified: Secondary | ICD-10-CM | POA: Diagnosis present

## 2020-07-19 DIAGNOSIS — Z7982 Long term (current) use of aspirin: Secondary | ICD-10-CM

## 2020-07-19 DIAGNOSIS — K56609 Unspecified intestinal obstruction, unspecified as to partial versus complete obstruction: Secondary | ICD-10-CM | POA: Diagnosis not present

## 2020-07-19 DIAGNOSIS — E46 Unspecified protein-calorie malnutrition: Secondary | ICD-10-CM | POA: Diagnosis present

## 2020-07-19 DIAGNOSIS — K565 Intestinal adhesions [bands], unspecified as to partial versus complete obstruction: Principal | ICD-10-CM | POA: Diagnosis present

## 2020-07-19 DIAGNOSIS — E8809 Other disorders of plasma-protein metabolism, not elsewhere classified: Secondary | ICD-10-CM | POA: Diagnosis not present

## 2020-07-19 DIAGNOSIS — Z9049 Acquired absence of other specified parts of digestive tract: Secondary | ICD-10-CM

## 2020-07-19 DIAGNOSIS — Z6824 Body mass index (BMI) 24.0-24.9, adult: Secondary | ICD-10-CM

## 2020-07-19 DIAGNOSIS — E43 Unspecified severe protein-calorie malnutrition: Secondary | ICD-10-CM | POA: Diagnosis present

## 2020-07-19 DIAGNOSIS — Z0189 Encounter for other specified special examinations: Secondary | ICD-10-CM

## 2020-07-19 DIAGNOSIS — R739 Hyperglycemia, unspecified: Secondary | ICD-10-CM | POA: Diagnosis not present

## 2020-07-19 DIAGNOSIS — D696 Thrombocytopenia, unspecified: Secondary | ICD-10-CM

## 2020-07-19 DIAGNOSIS — E778 Other disorders of glycoprotein metabolism: Secondary | ICD-10-CM | POA: Diagnosis not present

## 2020-07-19 DIAGNOSIS — Z20822 Contact with and (suspected) exposure to covid-19: Secondary | ICD-10-CM | POA: Diagnosis present

## 2020-07-19 DIAGNOSIS — D7589 Other specified diseases of blood and blood-forming organs: Secondary | ICD-10-CM | POA: Diagnosis present

## 2020-07-19 DIAGNOSIS — Z79899 Other long term (current) drug therapy: Secondary | ICD-10-CM

## 2020-07-19 DIAGNOSIS — Z87891 Personal history of nicotine dependence: Secondary | ICD-10-CM

## 2020-07-19 DIAGNOSIS — R5381 Other malaise: Secondary | ICD-10-CM | POA: Diagnosis present

## 2020-07-19 DIAGNOSIS — K9189 Other postprocedural complications and disorders of digestive system: Secondary | ICD-10-CM | POA: Diagnosis not present

## 2020-07-19 DIAGNOSIS — K5730 Diverticulosis of large intestine without perforation or abscess without bleeding: Secondary | ICD-10-CM | POA: Diagnosis present

## 2020-07-19 DIAGNOSIS — J449 Chronic obstructive pulmonary disease, unspecified: Secondary | ICD-10-CM | POA: Diagnosis present

## 2020-07-19 DIAGNOSIS — D6959 Other secondary thrombocytopenia: Secondary | ICD-10-CM | POA: Diagnosis present

## 2020-07-19 DIAGNOSIS — R066 Hiccough: Secondary | ICD-10-CM | POA: Diagnosis present

## 2020-07-19 DIAGNOSIS — E876 Hypokalemia: Secondary | ICD-10-CM | POA: Diagnosis present

## 2020-07-19 HISTORY — DX: Other intestinal obstruction unspecified as to partial versus complete obstruction: K56.699

## 2020-07-19 LAB — CBC
HCT: 45.4 % (ref 36.0–46.0)
Hemoglobin: 14.9 g/dL (ref 12.0–15.0)
MCH: 36.1 pg — ABNORMAL HIGH (ref 26.0–34.0)
MCHC: 32.8 g/dL (ref 30.0–36.0)
MCV: 109.9 fL — ABNORMAL HIGH (ref 80.0–100.0)
Platelets: 126 10*3/uL — ABNORMAL LOW (ref 150–400)
RBC: 4.13 MIL/uL (ref 3.87–5.11)
RDW: 13.9 % (ref 11.5–15.5)
WBC: 8.7 10*3/uL (ref 4.0–10.5)
nRBC: 0 % (ref 0.0–0.2)

## 2020-07-19 LAB — ABO/RH: ABO/RH(D): A POS

## 2020-07-19 LAB — COMPREHENSIVE METABOLIC PANEL
ALT: 28 U/L (ref 0–44)
AST: 61 U/L — ABNORMAL HIGH (ref 15–41)
Albumin: 4.2 g/dL (ref 3.5–5.0)
Alkaline Phosphatase: 56 U/L (ref 38–126)
Anion gap: 16 — ABNORMAL HIGH (ref 5–15)
BUN: 27 mg/dL — ABNORMAL HIGH (ref 8–23)
CO2: 24 mmol/L (ref 22–32)
Calcium: 9.4 mg/dL (ref 8.9–10.3)
Chloride: 99 mmol/L (ref 98–111)
Creatinine, Ser: 0.65 mg/dL (ref 0.44–1.00)
GFR, Estimated: >60 mL/min (ref >60)
Glucose, Bld: 144 mg/dL — ABNORMAL HIGH (ref 70–99)
Potassium: 3.3 mmol/L — ABNORMAL LOW (ref 3.5–5.1)
Sodium: 139 mmol/L (ref 135–145)
Total Bilirubin: 1.6 mg/dL — ABNORMAL HIGH (ref 0.3–1.2)
Total Protein: 8.2 g/dL — ABNORMAL HIGH (ref 6.5–8.1)

## 2020-07-19 LAB — SAMPLE TO BLOOD BANK

## 2020-07-19 LAB — TYPE AND SCREEN
ABO/RH(D): A POS
Antibody Screen: NEGATIVE

## 2020-07-19 LAB — POC OCCULT BLOOD, ED: Fecal Occult Bld: NEGATIVE

## 2020-07-19 NOTE — Telephone Encounter (Signed)
Pt called office stating she is still vomiting black stuff. She has an ER fu on 07/23/2020. The pt was transferred to [facility] who recommended her go back to the ED. [facility] transferred pt back to the office stating pt refused ED. After speaking to the patient about needed to have the nurse speak to the MD and call her back the son- Kevyn Wengert got on the phone. Son was unaware that patient was recommended to go back to the ED but refused and stated that if that is what she was recommended to do then that's where he will be taking her.

## 2020-07-19 NOTE — ED Triage Notes (Signed)
Per EMS-Patient c/o mid abdominal pain and vomiting dark emesis.   Patient also reports in triage that her urine is "pink" and that started this week.

## 2020-07-20 ENCOUNTER — Inpatient Hospital Stay (HOSPITAL_COMMUNITY): Payer: Medicare Other

## 2020-07-20 ENCOUNTER — Encounter (HOSPITAL_COMMUNITY): Payer: Self-pay

## 2020-07-20 ENCOUNTER — Emergency Department (HOSPITAL_COMMUNITY): Payer: Medicare Other

## 2020-07-20 DIAGNOSIS — D6959 Other secondary thrombocytopenia: Secondary | ICD-10-CM | POA: Diagnosis not present

## 2020-07-20 DIAGNOSIS — Z9049 Acquired absence of other specified parts of digestive tract: Secondary | ICD-10-CM | POA: Diagnosis not present

## 2020-07-20 DIAGNOSIS — D62 Acute posthemorrhagic anemia: Secondary | ICD-10-CM | POA: Diagnosis not present

## 2020-07-20 DIAGNOSIS — I700 Atherosclerosis of aorta: Secondary | ICD-10-CM | POA: Diagnosis not present

## 2020-07-20 DIAGNOSIS — E876 Hypokalemia: Secondary | ICD-10-CM | POA: Diagnosis not present

## 2020-07-20 DIAGNOSIS — Z6824 Body mass index (BMI) 24.0-24.9, adult: Secondary | ICD-10-CM | POA: Diagnosis not present

## 2020-07-20 DIAGNOSIS — Z4682 Encounter for fitting and adjustment of non-vascular catheter: Secondary | ICD-10-CM | POA: Diagnosis not present

## 2020-07-20 DIAGNOSIS — D696 Thrombocytopenia, unspecified: Secondary | ICD-10-CM | POA: Diagnosis not present

## 2020-07-20 DIAGNOSIS — K5651 Intestinal adhesions [bands], with partial obstruction: Secondary | ICD-10-CM | POA: Diagnosis not present

## 2020-07-20 DIAGNOSIS — D7589 Other specified diseases of blood and blood-forming organs: Secondary | ICD-10-CM | POA: Diagnosis present

## 2020-07-20 DIAGNOSIS — R066 Hiccough: Secondary | ICD-10-CM | POA: Diagnosis not present

## 2020-07-20 DIAGNOSIS — Z79899 Other long term (current) drug therapy: Secondary | ICD-10-CM | POA: Diagnosis not present

## 2020-07-20 DIAGNOSIS — K56609 Unspecified intestinal obstruction, unspecified as to partial versus complete obstruction: Secondary | ICD-10-CM | POA: Diagnosis not present

## 2020-07-20 DIAGNOSIS — Z4659 Encounter for fitting and adjustment of other gastrointestinal appliance and device: Secondary | ICD-10-CM | POA: Diagnosis not present

## 2020-07-20 DIAGNOSIS — E778 Other disorders of glycoprotein metabolism: Secondary | ICD-10-CM | POA: Diagnosis not present

## 2020-07-20 DIAGNOSIS — E440 Moderate protein-calorie malnutrition: Secondary | ICD-10-CM | POA: Diagnosis not present

## 2020-07-20 DIAGNOSIS — D539 Nutritional anemia, unspecified: Secondary | ICD-10-CM | POA: Diagnosis present

## 2020-07-20 DIAGNOSIS — R112 Nausea with vomiting, unspecified: Secondary | ICD-10-CM | POA: Insufficient documentation

## 2020-07-20 DIAGNOSIS — E43 Unspecified severe protein-calorie malnutrition: Secondary | ICD-10-CM | POA: Diagnosis not present

## 2020-07-20 DIAGNOSIS — E785 Hyperlipidemia, unspecified: Secondary | ICD-10-CM | POA: Diagnosis not present

## 2020-07-20 DIAGNOSIS — J449 Chronic obstructive pulmonary disease, unspecified: Secondary | ICD-10-CM | POA: Diagnosis not present

## 2020-07-20 DIAGNOSIS — K5939 Other megacolon: Secondary | ICD-10-CM | POA: Diagnosis not present

## 2020-07-20 DIAGNOSIS — K9189 Other postprocedural complications and disorders of digestive system: Secondary | ICD-10-CM | POA: Diagnosis not present

## 2020-07-20 DIAGNOSIS — E8809 Other disorders of plasma-protein metabolism, not elsewhere classified: Secondary | ICD-10-CM | POA: Diagnosis not present

## 2020-07-20 DIAGNOSIS — K567 Ileus, unspecified: Secondary | ICD-10-CM | POA: Diagnosis not present

## 2020-07-20 DIAGNOSIS — K5730 Diverticulosis of large intestine without perforation or abscess without bleeding: Secondary | ICD-10-CM | POA: Diagnosis not present

## 2020-07-20 DIAGNOSIS — K565 Intestinal adhesions [bands], unspecified as to partial versus complete obstruction: Secondary | ICD-10-CM | POA: Diagnosis not present

## 2020-07-20 DIAGNOSIS — R739 Hyperglycemia, unspecified: Secondary | ICD-10-CM | POA: Diagnosis not present

## 2020-07-20 DIAGNOSIS — Z20822 Contact with and (suspected) exposure to covid-19: Secondary | ICD-10-CM | POA: Diagnosis not present

## 2020-07-20 DIAGNOSIS — Z7982 Long term (current) use of aspirin: Secondary | ICD-10-CM | POA: Diagnosis not present

## 2020-07-20 DIAGNOSIS — R5381 Other malaise: Secondary | ICD-10-CM | POA: Diagnosis present

## 2020-07-20 DIAGNOSIS — Z87891 Personal history of nicotine dependence: Secondary | ICD-10-CM | POA: Diagnosis not present

## 2020-07-20 DIAGNOSIS — K6389 Other specified diseases of intestine: Secondary | ICD-10-CM | POA: Diagnosis not present

## 2020-07-20 LAB — CBC
HCT: 43.8 % (ref 36.0–46.0)
Hemoglobin: 14.6 g/dL (ref 12.0–15.0)
MCH: 36.2 pg — ABNORMAL HIGH (ref 26.0–34.0)
MCHC: 33.3 g/dL (ref 30.0–36.0)
MCV: 108.7 fL — ABNORMAL HIGH (ref 80.0–100.0)
Platelets: 130 10*3/uL — ABNORMAL LOW (ref 150–400)
RBC: 4.03 MIL/uL (ref 3.87–5.11)
RDW: 13.8 % (ref 11.5–15.5)
WBC: 7.2 10*3/uL (ref 4.0–10.5)
nRBC: 0 % (ref 0.0–0.2)

## 2020-07-20 LAB — MAGNESIUM: Magnesium: 2.5 mg/dL — ABNORMAL HIGH (ref 1.7–2.4)

## 2020-07-20 LAB — BASIC METABOLIC PANEL
Anion gap: 14 (ref 5–15)
BUN: 28 mg/dL — ABNORMAL HIGH (ref 8–23)
CO2: 26 mmol/L (ref 22–32)
Calcium: 9.7 mg/dL (ref 8.9–10.3)
Chloride: 99 mmol/L (ref 98–111)
Creatinine, Ser: 0.59 mg/dL (ref 0.44–1.00)
GFR, Estimated: >60 mL/min (ref >60)
Glucose, Bld: 112 mg/dL — ABNORMAL HIGH (ref 70–99)
Potassium: 3.6 mmol/L (ref 3.5–5.1)
Sodium: 139 mmol/L (ref 135–145)

## 2020-07-20 LAB — RESP PANEL BY RT-PCR (FLU A&B, COVID) ARPGX2
Influenza A by PCR: NEGATIVE
Influenza B by PCR: NEGATIVE
SARS Coronavirus 2 by RT PCR: NEGATIVE

## 2020-07-20 MED ORDER — MAGIC MOUTHWASH
15.0000 mL | Freq: Four times a day (QID) | ORAL | Status: DC | PRN
  Filled 2020-07-20: qty 15

## 2020-07-20 MED ORDER — SODIUM CHLORIDE 0.9 % IV SOLN
8.0000 mg | Freq: Four times a day (QID) | INTRAVENOUS | Status: DC | PRN
  Filled 2020-07-20: qty 4

## 2020-07-20 MED ORDER — SIMETHICONE 40 MG/0.6ML PO SUSP
40.0000 mg | Freq: Four times a day (QID) | ORAL | Status: DC | PRN
  Filled 2020-07-20: qty 0.6

## 2020-07-20 MED ORDER — PHENOL 1.4 % MT LIQD
2.0000 | OROMUCOSAL | Status: DC | PRN
  Administered 2020-07-23: 11:00:00 2 via OROMUCOSAL
  Filled 2020-07-20 (×2): qty 177

## 2020-07-20 MED ORDER — ONDANSETRON HCL 4 MG PO TABS: 4.0000 mg | ORAL_TABLET | Freq: Four times a day (QID) | ORAL | Status: DC | PRN

## 2020-07-20 MED ORDER — PROCHLORPERAZINE EDISYLATE 10 MG/2ML IJ SOLN: 5.0000 mg | INTRAMUSCULAR | Status: DC | PRN

## 2020-07-20 MED ORDER — DIATRIZOATE MEGLUMINE & SODIUM 66-10 % PO SOLN
90.0000 mL | Freq: Once | ORAL | Status: AC
  Administered 2020-07-20: 90 mL via NASOGASTRIC
  Filled 2020-07-20: qty 90

## 2020-07-20 MED ORDER — ONDANSETRON HCL 4 MG/2ML IJ SOLN
4.0000 mg | Freq: Four times a day (QID) | INTRAMUSCULAR | Status: DC | PRN
  Administered 2020-07-22 (×2): 4 mg via INTRAVENOUS
  Filled 2020-07-20 (×2): qty 2

## 2020-07-20 MED ORDER — BISACODYL 10 MG RE SUPP
10.0000 mg | Freq: Every day | RECTAL | Status: DC
  Administered 2020-07-20 – 2020-07-22 (×3): 10 mg via RECTAL
  Filled 2020-07-20 (×3): qty 1

## 2020-07-20 MED ORDER — LACTATED RINGERS IV BOLUS
1000.0000 mL | Freq: Once | INTRAVENOUS | Status: AC
  Administered 2020-07-20: 1000 mL via INTRAVENOUS

## 2020-07-20 MED ORDER — LIP MEDEX EX OINT
1.0000 "application " | TOPICAL_OINTMENT | Freq: Two times a day (BID) | CUTANEOUS | Status: DC
  Administered 2020-07-20 – 2020-08-03 (×30): 1 via TOPICAL
  Filled 2020-07-20 (×5): qty 7

## 2020-07-20 MED ORDER — LIDOCAINE VISCOUS HCL 2 % MT SOLN
15.0000 mL | Freq: Once | OROMUCOSAL | Status: DC
  Filled 2020-07-20: qty 15

## 2020-07-20 MED ORDER — ACETAMINOPHEN 650 MG RE SUPP: 650.0000 mg | Freq: Four times a day (QID) | RECTAL | Status: DC | PRN

## 2020-07-20 MED ORDER — METHOCARBAMOL 1000 MG/10ML IJ SOLN
1000.0000 mg | Freq: Four times a day (QID) | INTRAVENOUS | Status: DC | PRN
  Administered 2020-07-22: 18:00:00 1000 mg via INTRAVENOUS
  Filled 2020-07-20: qty 1000
  Filled 2020-07-20: qty 10

## 2020-07-20 MED ORDER — IOHEXOL 300 MG/ML  SOLN
100.0000 mL | Freq: Once | INTRAMUSCULAR | Status: AC | PRN
  Administered 2020-07-20: 100 mL via INTRAVENOUS

## 2020-07-20 MED ORDER — LIDOCAINE HCL URETHRAL/MUCOSAL 2 % EX GEL
1.0000 "application " | Freq: Once | CUTANEOUS | Status: AC
  Administered 2020-07-20: 1
  Filled 2020-07-20: qty 11

## 2020-07-20 MED ORDER — POTASSIUM CHLORIDE 10 MEQ/100ML IV SOLN
10.0000 meq | INTRAVENOUS | Status: AC
  Administered 2020-07-20 (×2): 10 meq via INTRAVENOUS
  Filled 2020-07-20 (×2): qty 100

## 2020-07-20 MED ORDER — ONDANSETRON HCL 4 MG/2ML IJ SOLN: 4.0000 mg | Freq: Four times a day (QID) | INTRAMUSCULAR | Status: DC | PRN

## 2020-07-20 MED ORDER — LACTATED RINGERS IV SOLN: INTRAVENOUS | Status: AC

## 2020-07-20 MED ORDER — LACTATED RINGERS IV BOLUS: 1000.0000 mL | Freq: Three times a day (TID) | INTRAVENOUS | Status: AC | PRN

## 2020-07-20 MED ORDER — ENOXAPARIN SODIUM 40 MG/0.4ML ~~LOC~~ SOLN
40.0000 mg | SUBCUTANEOUS | Status: DC
  Administered 2020-07-20 – 2020-07-22 (×3): 40 mg via SUBCUTANEOUS
  Filled 2020-07-20 (×3): qty 0.4

## 2020-07-20 MED ORDER — MENTHOL 3 MG MT LOZG
1.0000 | LOZENGE | OROMUCOSAL | Status: DC | PRN
  Filled 2020-07-20: qty 9

## 2020-07-20 MED ORDER — ALUM & MAG HYDROXIDE-SIMETH 200-200-20 MG/5ML PO SUSP: 30.0000 mL | Freq: Four times a day (QID) | ORAL | Status: DC | PRN

## 2020-07-20 MED ORDER — HYDROMORPHONE HCL 1 MG/ML IJ SOLN
0.5000 mg | INTRAMUSCULAR | Status: DC | PRN
  Administered 2020-07-21 – 2020-07-22 (×3): 0.5 mg via INTRAVENOUS
  Filled 2020-07-20 (×3): qty 0.5

## 2020-07-20 NOTE — Assessment & Plan Note (Signed)
-   replete and recheck as needed 

## 2020-07-20 NOTE — ED Notes (Addendum)
External female catheter placed on patient.

## 2020-07-20 NOTE — ED Notes (Signed)
2 attempts to get an IV for CT scan with no success. MD made aware.

## 2020-07-20 NOTE — Assessment & Plan Note (Addendum)
-   s/p hx of Hartmann's procedure for obstructed diverticulitis  - now s/p LOA on 07/22/20

## 2020-07-20 NOTE — Hospital Course (Addendum)
Ms. Gaetz is an 87 yo female with PMH diverticulitis (s/p Hartmann procedure 03/22/19 with takedown on 09/04/19), HLD, COPD who presented to the hospital with abdominal pain and nausea/vomiting.  She had reported that she had not had any food for approximately 1 week prior to admission and was only drinking Gatorade and water.  She also reported that she had no flatus or bowel movements also within 1 week. NG tube attempt was made in the ER however unsuccessful and IR guided placement was ordered. General surgery was also consulted as CT abdomen/pelvis showed a small bowel obstruction in the right lower quadrant with transition point. She had minimal improvement with bowel rest, NGT placement and was taken for ex lap on 07/22/2020.  She underwent lysis of adhesions and tolerated procedure well.  Due to her prolonged n.p.o. intake even prior to admission and potential ongoing n.p.o. status while awaiting return of bowel function, she was ordered to begin on TPN and PICC line placement on 07/22/2020.

## 2020-07-20 NOTE — ED Provider Notes (Signed)
[facility] DEPT Provider Note   CSN: [id number] Arrival date & time: 07/19/20  1457     History Chief Complaint  Patient presents with  . Abdominal Pain  . Hematemesis    Theresa Arnold is a 87 y.o. female.  Patient presents to the emergency department with persistent vomiting.  Patient thinks she is vomiting blood because the vomit is black.  Patient reports that she was here 2 days ago for the same.  She talked to her doctor and was told to come back to the ER.  Patient reports that the vomiting is now worse.  She is extremely weak, cannot walk to the bathroom without assistance.  Normally she is ambulatory without difficulty.  Reports abdominal distention and diffuse abdominal pain.        Past Medical History:  Diagnosis Date  . CERVICAL RADICULOPATHY, LEFT 09/03/2010  . Cervical spondylosis 11/10/2011  . COPD (chronic obstructive pulmonary disease) ([place]) 11/10/2011  . Diverticulitis   . HYPERLIPIDEMIA 09/03/2010    Patient Active Problem List   Diagnosis Date Noted  . S/P colostomy takedown 09/04/2019  . Nausea 06/23/2019  . Abnormal LFTs 06/11/2019  . Acute blood loss anemia 06/11/2019  . Hypoxia 03/24/2019  . Hiccups 03/23/2019  . Colonic partial obstruction from sigmoid stricture 03/18/2019  . Polyp of sigmoid colon 03/18/2019  . Oral thrush 03/18/2019  . Abnormal CT scan, gastrointestinal tract   . Abnormal CT scan, sigmoid colon   . 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 Diverticulitis of sigmoid colon 03/15/2019  . LLQ pain 11/02/2018  . Abdominal pain 09/21/2018  . Venous (peripheral) insufficiency 01/12/2014  . Sprain of ankle, unspecified site 06/28/2013  . Preventative health care 11/10/2011  . Cervical spondylosis 11/10/2011  . OA (osteoarthritis) of knee 11/10/2011  . COPD (chronic obstructive pulmonary disease) ([place]) 11/10/2011  . Dizziness 11/10/2011  . Benign neoplasm of colon 11/24/2010  . Diverticulosis of colon (without  mention of hemorrhage) 11/24/2010  . Encounter for long-term (current) use of other medications 10/07/2010  . HLD (hyperlipidemia) 09/03/2010  . FATIGUE 09/03/2010  . SMOKER 09/10/2009    Past Surgical History:  Procedure Laterality Date  . COLONOSCOPY    . COLOSTOMY REVERSAL N/A 09/04/2019   Procedure: PRIMARY COLOCOLOSTOMY REVERSAL AND TAKEDOWN OF HARTMAN;  Surgeon: [name], MD;  Location: [facility] ORS;  Service: General;  Laterality: N/A;  . EYE SURGERY     bilateral cataracts with lens implants  . LAPAROSCOPIC PARTIAL COLECTOMY N/A 03/22/2019   Procedure: LAPAROSCOPY, SPLENIC FLEXURE MOBILIZATION WITH OPEN HARTMANN PROCEDURE;  Surgeon: [name], MD;  Location: [facility] ORS;  Service: General;  Laterality: N/A;     OB History   No obstetric history on file.     Family History  Problem Relation Age of Onset  . Colon cancer Sister   . Breast cancer Sister   . Heart disease Father   . Hip fracture Mother   . Hypertension Other     Social History   Tobacco Use  . Smoking status: Former Smoker    Packs/day: 1.00    Years: 30.00    Pack years: 30.00    Types: Cigarettes    Quit date: 07/14/2011    Years since quitting: 9.0  . Smokeless tobacco: Never Used  . Tobacco comment: QUIT 2013   Vaping Use  . Vaping Use: Never used  Substance Use Topics  . Alcohol use: Yes    Comment: wine occasionally  . Drug use: No  Home Medications Prior to Admission medications   Medication Sig Start Date End Date Taking? Authorizing Provider  acetaminophen (TYLENOL) 650 MG CR tablet Take 1,300 mg by mouth every 8 (eight) hours as needed for pain.    [provider]  aspirin 325 MG tablet Take 325 mg by mouth every 6 (six) hours as needed for mild pain.    [provider]  HYDROcodone-acetaminophen (HYCET) 7.5-325 mg/15 ml solution Take 15 mLs by mouth 4 (four) times daily as needed for moderate pain. Patient not taking: Reported on 07/17/2020 09/10/19 09/09/20   [name], MD  ondansetron (ZOFRAN) 4 MG tablet Take 1 tablet (4 mg total) by mouth every 6 (six) hours as needed for nausea or vomiting. 07/17/20 07/17/21  [name], MD    Allergies    Patient has no known allergies.  Review of Systems   Review of Systems  Gastrointestinal: Positive for abdominal distention, abdominal pain, nausea and vomiting.  All other systems reviewed and are negative.   Physical Exam Updated Vital Signs BP (!) 150/66 (BP Location: Right Arm)   Pulse 77   Temp 98.3 F (36.8 C) (Oral)   Resp 15   Ht 5' (1.524 m)   Wt 61.2 kg   SpO2 98%   BMI 26.37 kg/m   Physical Exam Vitals and nursing note reviewed.  Constitutional:      General: She is not in acute distress.    Appearance: Normal appearance. She is well-developed and well-nourished.  HENT:     Head: Normocephalic and atraumatic.     Right Ear: Hearing normal.     Left Ear: Hearing normal.     Nose: Nose normal.     Mouth/Throat:     Mouth: Oropharynx is clear and moist and mucous membranes are normal.  Eyes:     Extraocular Movements: EOM normal.     Conjunctiva/sclera: Conjunctivae normal.     Pupils: Pupils are equal, round, and reactive to light.  Cardiovascular:     Rate and Rhythm: Regular rhythm.     Heart sounds: S1 normal and S2 normal. No murmur heard. No friction rub. No gallop.   Pulmonary:     Effort: Pulmonary effort is normal. No respiratory distress.     Breath sounds: Normal breath sounds.  Chest:     Chest wall: No tenderness.  Abdominal:     General: Bowel sounds are normal.     Palpations: Abdomen is soft. There is no hepatosplenomegaly.     Tenderness: There is generalized abdominal tenderness. There is no guarding or rebound. Negative signs include Murphy's sign and McBurney's sign.     Hernia: No hernia is present.  Musculoskeletal:        General: Normal range of motion.     Cervical back: Normal range of motion and neck supple.  Skin:    General: Skin  is warm, dry and intact.     Findings: No rash.     Nails: There is no cyanosis.  Neurological:     Mental Status: She is alert and oriented to person, place, and time.     GCS: GCS eye subscore is 4. GCS verbal subscore is 5. GCS motor subscore is 6.     Cranial Nerves: No cranial nerve deficit.     Sensory: No sensory deficit.     Coordination: Coordination normal.     Deep Tendon Reflexes: Strength normal.  Psychiatric:        Mood and Affect: Mood  and affect normal.        Speech: Speech normal.        Behavior: Behavior normal.        Thought Content: Thought content normal.     ED Results / Procedures / Treatments   Labs (all labs ordered are listed, but only abnormal results are displayed) Labs Reviewed  COMPREHENSIVE METABOLIC PANEL - Abnormal; Notable for the following components:      Result Value   Potassium 3.3 (*)    Glucose, Bld 144 (*)    BUN 27 (*)    Total Protein 8.2 (*)    AST 61 (*)    Total Bilirubin 1.6 (*)    Anion gap 16 (*)    All other components within normal limits  CBC - Abnormal; Notable for the following components:   MCV 109.9 (*)    MCH 36.1 (*)    Platelets 126 (*)    All other components within normal limits  RESP PANEL BY RT-PCR (FLU A&B, COVID) ARPGX2  POC OCCULT BLOOD, ED  TYPE AND SCREEN  ABO/RH    EKG None  Radiology CT ABDOMEN PELVIS W CONTRAST  Result Date: 07/20/2020 CLINICAL DATA:  Initial evaluation for acute abdominal pain and distension. EXAM: CT ABDOMEN AND PELVIS WITH CONTRAST TECHNIQUE: Multidetector CT imaging of the abdomen and pelvis was performed using the standard protocol following bolus administration of intravenous contrast. CONTRAST:  186mL OMNIPAQUE IOHEXOL 300 MG/ML  SOLN COMPARISON:  Prior CT from 03/11/2019. FINDINGS: Lower chest: Trace layering bilateral pleural effusions with associated atelectasis. Scattered coronary artery calcifications noted. Visualized lungs are otherwise clear. Hepatobiliary: Few  scattered small hepatic cysts noted, stable from previous. Liver otherwise unremarkable. Gallbladder within normal limits. No biliary dilatation. Pancreas: Pancreas within normal limits. Spleen: Spleen within normal limits. Adrenals/Urinary Tract: Adrenal glands are normal. Kidneys equal size with symmetric enhancement. Few scattered subcentimeter hypodense lesions noted within the kidneys, too small the characterize, but statistically likely reflects small cyst. No nephrolithiasis or hydronephrosis. No focal renal mass. No visible hydroureter. Partially distended bladder within normal limits. Stomach/Bowel: Mild circumferential wall thickening about the visualized esophagus. Stomach within normal limits. Multiple prominent loops of small bowel seen within the left and mid abdomen, measuring up to 3.9 cm in diameter. Associated internal air-fluid levels. Focal transition point within the right lower quadrant (series 5, image 60). Small bowel is decompressed distally. Finding consistent with a mechanical small bowel obstruction. Underlying adhesive disease is suspected. No pneumatosis or portal venous gas. Appendix is normal. Colon is diffusely decompressed. Scattered colonic diverticulosis involving the right colon without diverticulitis. Sequelae of prior partial colectomy noted at the left lower quadrant. Vascular/Lymphatic: Moderate aorto bi-iliac atherosclerotic disease. No aneurysm. Mesenteric vessels grossly patent proximally. No adenopathy. Reproductive: Uterus within normal limits. Ovaries are atrophic in appearance bilaterally. Few small dystrophic calcifications noted involving the left ovary. No adnexal mass. Other: Trace free fluid seen adjacent to the dilated loops of bowel as well as adjacent to the spleen, likely reactive. No free air. Small fat containing paraumbilical hernia noted. Musculoskeletal: No acute osseous finding. No discrete or worrisome osseous lesions. IMPRESSION: 1. Findings consistent  with a mechanical small bowel obstruction with transition point in the right lower quadrant. Underlying adhesive disease is suspected. 2. Trace layering bilateral pleural effusions with associated atelectasis. 3. Colonic diverticulosis without diverticulitis. 4. Aortic Atherosclerosis (ICD10-I70.0). Electronically Signed   By: Jeannine Boga M.D.   On: 07/20/2020 02:47    Procedures Procedures (including critical  care time)  Medications Ordered in ED Medications  iohexol (OMNIPAQUE) 300 MG/ML solution 100 mL (100 mLs Intravenous Contrast Given 07/20/20 0208)    ED Course  I have reviewed the triage vital signs and the nursing notes.  Pertinent labs & imaging results that were available during my care of the patient were reviewed by me and considered in my medical decision making (see chart for details).    MDM Rules/Calculators/A&P                          Patient presents to the emergency department stating that she has been vomiting for a couple of days and thinks there has been blood in it. She was seen in the emergency department 2 days ago and had normal vital signs, normal labs and there was no vomiting in the department. Since then, however, she reports worsening abdominal pain, distention, vomiting. She reports the vomitus has been black. She has not vomited here in the department. Rectal exam revealed scant stool but it was heme-negative. She does, however, have elevated BUN from her baseline, consider some level of upper GI bleeding but hemoglobin has not significantly dropped. A CT scan was performed, and does show evidence of small bowel obstruction. NG tube to be placed, will be admitted for further management.  Final Clinical Impression(s) / ED Diagnoses Final diagnoses:  SBO (small bowel obstruction) ([place])    Rx / DC Orders ED Discharge Orders    None       Bindu Docter, Gwenyth Allegra, MD 07/20/20 [phone]

## 2020-07-20 NOTE — ED Notes (Signed)
Made Pollina, MD aware of the inability to place NG tube x3

## 2020-07-20 NOTE — Assessment & Plan Note (Addendum)
-  Appears to have a normal platelet count looking back several years.  Mild thrombocytopenia started around February 2021 at her last surgery -Platelet count on admission 136k - no bleeding - monitor for now; remaining stable ~130

## 2020-07-20 NOTE — H&P (Signed)
History and Physical    Theresa Arnold:[id number] DOB: 01-03-1932 DOA: 07/19/2020  PCP: Corwin Levins, MD   Patient coming from:  Home  Chief Complaint: Abdominal pain, nausea and vomiting  HPI: Theresa Arnold is a 87 y.o. female with medical history significant for diverticulitis which required colostomy in past (colostomy has been reversed) who presents for evaluation of abdominal pain with persistent nausea and vomiting. She was seen a few days ago in ER for similar symptoms and was discharged home. Patient thinks she is vomiting blood because the vomit is black.  Patient reports that the vomiting is now worse.  She is extremely weak and cannot walk to the bathroom without assistance. She is normally independent and is ambulatory without difficulty.  Reports abdominal distention and diffuse abdominal pain. She has not had fever. She denies injury or trauma to abdomen. She has not eaten in past few days due to vomiting and could not tolerate eating.   ED Course: CT scan in the emergency room shows small bowel obstruction with transition point in the right lower quadrant.  Hospitalist service has been asked to admit for further management  Review of Systems:  General: Reports generalized weakness. Denies fever, chills, weight loss, night sweats.  Denies dizziness. Reports decreased appetite HENT: Denies head trauma, headache, denies change in hearing, tinnitus.  Denies nasal congestion or bleeding.  Denies sore throat, sores in mouth.  Denies difficulty swallowing Eyes: Denies blurry vision, pain in eye, drainage.  Denies discoloration of eyes. Neck: Denies pain.  Denies swelling.  Denies pain with movement. Cardiovascular: Denies chest pain, palpitations.  Denies edema.  Denies orthopnea Respiratory: Denies shortness of breath, cough.  Denies wheezing.  Denies sputum production Gastrointestinal: Reports abdominal pain, swelling. Reports nausea, vomiting. Denies diarrhea.  Denies melena.   Denies hematemesis. Musculoskeletal: Denies limitation of movement. Denies deformity or swelling. Denies arthralgias or myalgias. Genitourinary: Denies pelvic pain.  Denies urinary frequency or hesitancy.  Denies dysuria.  Skin: Denies rash.  Denies petechiae, purpura, ecchymosis. Neurological: Denies syncope.  Denies seizure activity.  Denies weakness or paresthesia.  Denies slurred speech, drooping face.  Denies visual change. Psychiatric: Denies depression, anxiety. Denies hallucinations.  Past Medical History:  Diagnosis Date  . CERVICAL RADICULOPATHY, LEFT 09/03/2010  . Cervical spondylosis 11/10/2011  . COPD (chronic obstructive pulmonary disease) (HCC) 11/10/2011  . Diverticulitis   . HYPERLIPIDEMIA 09/03/2010    Past Surgical History:  Procedure Laterality Date  . COLONOSCOPY    . COLOSTOMY REVERSAL N/A 09/04/2019   Procedure: PRIMARY COLOCOLOSTOMY REVERSAL AND TAKEDOWN OF HARTMAN;  Surgeon: Luretha Murphy, MD;  Location: [facility];  Service: General;  Laterality: N/A;  . EYE SURGERY     bilateral cataracts with lens implants  . LAPAROSCOPIC PARTIAL COLECTOMY N/A 03/22/2019   Procedure: LAPAROSCOPY, SPLENIC FLEXURE MOBILIZATION WITH OPEN HARTMANN PROCEDURE;  Surgeon: Luretha Murphy, MD;  Location: [facility];  Service: General;  Laterality: N/A;    Social History  reports that she quit smoking about 9 years ago. Her smoking use included cigarettes. She has a 30.00 pack-year smoking history. She has never used smokeless tobacco. She reports current alcohol use. She reports that she does not use drugs.  No Known Allergies  Family History  Problem Relation Age of Onset  . Colon cancer Sister   . Breast cancer Sister   . Heart disease Father   . Hip fracture Mother   . Hypertension Other      Prior to Admission medications  Medication Sig Start Date End Date Taking? Authorizing Provider  acetaminophen (TYLENOL) 650 MG CR tablet Take 1,300 mg by mouth every 8 (eight) hours as  needed for pain.    [provider]  aspirin 325 MG tablet Take 325 mg by mouth every 6 (six) hours as needed for mild pain.    [provider]  HYDROcodone-acetaminophen (HYCET) 7.5-325 mg/15 ml solution Take 15 mLs by mouth 4 (four) times daily as needed for moderate pain. Patient not taking: Reported on 07/17/2020 09/10/19 09/09/20  Harriette Bouillon, MD  ondansetron (ZOFRAN) 4 MG tablet Take 1 tablet (4 mg total) by mouth every 6 (six) hours as needed for nausea or vomiting. 07/17/20 07/17/21  Mancel Bale, MD    Physical Exam: Vitals:   07/20/20 0100 07/20/20 0145 07/20/20 0239 07/20/20 0300  BP: (!) 143/74 138/80 (!) 150/66 139/65  Pulse: 89 98 77 78  Resp: 17  15 15   Temp:      TempSrc:      SpO2: 98% 96% 98% 98%  Weight:      Height:        Constitutional: NAD, calm, comfortable Vitals:   07/20/20 0100 07/20/20 0145 07/20/20 0239 07/20/20 0300  BP: (!) 143/74 138/80 (!) 150/66 139/65  Pulse: 89 98 77 78  Resp: 17  15 15   Temp:      TempSrc:      SpO2: 98% 96% 98% 98%  Weight:      Height:       General: WDWN, Alert and oriented x3.  Eyes: EOMI, PERRL, conjunctivae normal.  Sclera nonicteric HENT:  [place]/AT, external ears normal.  Nares patent without epistasis.  Mucous membranes are moist.   Neck: Soft, normal range of motion, supple, no masses, no thyromegaly.  Trachea midline Respiratory: clear to auscultation bilaterally, no wheezing, no crackles. Normal respiratory effort. No accessory muscle use.  Cardiovascular: Regular rate and rhythm, no murmurs / rubs / gallops. No extremity edema. 2+ pedal pulses bilaterally Abdomen: Soft, mild diffuse tenderness, distended, no rebound or guarding.  No masses palpated. No hepatosplenomegaly. Bowel sounds hypoactive Musculoskeletal: FROM. no clubbing / cyanosis. No joint deformity upper and lower extremities. Normal muscle tone.  Skin: Warm, dry, intact no rashes, lesions, ulcers. No induration Neurologic: CN 2-12  grossly intact.  Normal speech.  Sensation intact, Strength 5/5 in all extremities.   Psychiatric: Normal judgment and insight.  Normal mood.    Labs on Admission: I have personally reviewed following labs and imaging studies  CBC: Recent Labs  Lab 07/17/20 0943 07/19/20 1535  WBC 8.6 8.7  NEUTROABS 6.5  --   HGB 15.6* 14.9  HCT 46.4* 45.4  MCV 108.4* 109.9*  PLT 136* 126*    Basic Metabolic Panel: Recent Labs  Lab 07/17/20 0953 07/19/20 1535  NA 140 139  K 4.1 3.3*  CL 98 99  CO2 26 24  GLUCOSE 121* 144*  BUN 26* 27*  CREATININE 0.88 0.65  CALCIUM 9.5 9.4    GFR: Estimated Creatinine Clearance: 41.3 mL/min (by C-G formula based on SCr of 0.65 mg/dL).  Liver Function Tests: Recent Labs  Lab 07/17/20 0953 07/19/20 1535  AST 66* 61*  ALT 34 28  ALKPHOS 57 56  BILITOT 1.1 1.6*  PROT 8.2* 8.2*  ALBUMIN 4.2 4.2    Urine analysis:    Component Value Date/Time   COLORURINE STRAW (A) 03/11/2019 1906   APPEARANCEUR CLEAR 03/11/2019 1906   LABSPEC 1.003 (L) 03/11/2019 1906   PHURINE  8.0 03/11/2019 1906   GLUCOSEU NEGATIVE 03/11/2019 1906   GLUCOSEU NEGATIVE 11/02/2018 1103   HGBUR NEGATIVE 03/11/2019 1906   BILIRUBINUR NEGATIVE 03/11/2019 1906   KETONESUR NEGATIVE 03/11/2019 1906   PROTEINUR NEGATIVE 03/11/2019 1906   UROBILINOGEN 0.2 11/02/2018 1103   NITRITE NEGATIVE 03/11/2019 1906   LEUKOCYTESUR NEGATIVE 03/11/2019 1906    Radiological Exams on Admission: CT ABDOMEN PELVIS W CONTRAST  Result Date: 07/20/2020 CLINICAL DATA:  Initial evaluation for acute abdominal pain and distension. EXAM: CT ABDOMEN AND PELVIS WITH CONTRAST TECHNIQUE: Multidetector CT imaging of the abdomen and pelvis was performed using the standard protocol following bolus administration of intravenous contrast. CONTRAST:  OMNIPAQUE IOHEXOL 300 MG/ML  SOLN COMPARISON:  Prior CT from 03/11/2019. FINDINGS: Lower chest: Trace layering bilateral pleural effusions with associated  atelectasis. Scattered coronary artery calcifications noted. Visualized lungs are otherwise clear. Hepatobiliary: Few scattered small hepatic cysts noted, stable from previous. Liver otherwise unremarkable. Gallbladder within normal limits. No biliary dilatation. Pancreas: Pancreas within normal limits. Spleen: Spleen within normal limits. Adrenals/Urinary Tract: Adrenal glands are normal. Kidneys equal size with symmetric enhancement. Few scattered subcentimeter hypodense lesions noted within the kidneys, too small the characterize, but statistically likely reflects small cyst. No nephrolithiasis or hydronephrosis. No focal renal mass. No visible hydroureter. Partially distended bladder within normal limits. Stomach/Bowel: Mild circumferential wall thickening about the visualized esophagus. Stomach within normal limits. Multiple prominent loops of small bowel seen within the left and mid abdomen, measuring up to 3.9 cm in diameter. Associated internal air-fluid levels. Focal transition point within the right lower quadrant (series 5, image 60). Small bowel is decompressed distally. Finding consistent with a mechanical small bowel obstruction. Underlying adhesive disease is suspected. No pneumatosis or portal venous gas. Appendix is normal. Colon is diffusely decompressed. Scattered colonic diverticulosis involving the right colon without diverticulitis. Sequelae of prior partial colectomy noted at the left lower quadrant. Vascular/Lymphatic: Moderate aorto bi-iliac atherosclerotic disease. No aneurysm. Mesenteric vessels grossly patent proximally. No adenopathy. Reproductive: Uterus within normal limits. Ovaries are atrophic in appearance bilaterally. Few small dystrophic calcifications noted involving the left ovary. No adnexal mass. Other: Trace free fluid seen adjacent to the dilated loops of bowel as well as adjacent to the spleen, likely reactive. No free air. Small fat containing paraumbilical hernia noted.  Musculoskeletal: No acute osseous finding. No discrete or worrisome osseous lesions. IMPRESSION: 1. Findings consistent with a mechanical small bowel obstruction with transition point in the right lower quadrant. Underlying adhesive disease is suspected. 2. Trace layering bilateral pleural effusions with associated atelectasis. 3. Colonic diverticulosis without diverticulitis. 4. Aortic Atherosclerosis (ICD10-I70.0). Electronically Signed   By: Rise Mu M.D.   On: 07/20/2020 02:47    Assessment/Plan Principal Problem:   SBO (small bowel obstruction)  Theresa Arnold is admitted to Med-Surg floor.  NGT placed to LIWS for decompression of GI tract.  Pt will be kept NPO and slowly advance diet as clinically improves.  If no improvement in next 24 hours will consult surgery for evaluation.  Pain control with dilaudid.  IVF hydration  Active Problems:   Hypokalemia Given 20 meq KCl IV.  Check magnesium level and if low will replete.  Check electrolytes and renal function in am    Thrombocytopenia  Reviewing labs, Theresa Arnold has had low platelets over past 10-12 months. No bleeding or bruising complications. Will recheck CBC in am    Nausea and vomiting Anti-emetic provided as needed.     DVT prophylaxis: Lovenox for DVT  prophylaxis.   Code Status:   Full code  Family Communication:  Diagnosis and plan discussed with patient.  Patient verbalized understanding and agrees with plan.  Questions were answered.  Further recommendations to follow as clinically indicated Disposition Plan:   Patient is from:  Home  Anticipated DC to:  Home  Anticipated DC date:  Anticipate more than 2 midnight stay in the hospital to treat acute      condition  Anticipated DC barriers: No barriers to discharge identified at this time  Admission status:  Inpatient  Claudean Severance Schylar Wuebker MD [facility]  How to contact the [facility] Attending or Consulting provider 7A - 7P or covering provider during  after hours 7P -7A, for this patient?   1. Check the care team in [facility] and look for a) attending/consulting [facility] provider listed and b) the [facility] team listed 2. Log into [email or web address] and use [facility] universal password to access. If you do not have the password, please contact the hospital operator. 3. Locate the [facility] provider you are looking for under [facility] and page to a number that you can be directly reached. 4. If you still have difficulty reaching the provider, please page the [facility] (Director on Call) for the Hospitalists listed on amion for assistance.  07/20/2020, 3:44 AM

## 2020-07-20 NOTE — Assessment & Plan Note (Addendum)
-   no anemia noted but has been in the past; may be hemoconcentrated if hasn't eaten in ~1 week  - trend CBC while on IVF - follow up B12, folate (both adequate)

## 2020-07-20 NOTE — ED Notes (Signed)
Placed pt in hospital bed

## 2020-07-20 NOTE — Assessment & Plan Note (Signed)
-   supportive care for now

## 2020-07-20 NOTE — Progress Notes (Signed)
PROGRESS NOTE    Theresa Arnold   [id number]  DOB: 1931-09-05  DOA: 07/19/2020     0  PCP: Biagio Borg, MD  CC: abd pain, N/V  Hospital Course: Theresa Arnold is an 87 yo female with PMH diverticulitis (s/p Hartmann procedure 03/22/19 with takedown on 09/04/19), HLD, COPD who presented to the hospital with abdominal pain and nausea/vomiting.  She had reported that she had not had any food for approximately 1 week prior to admission and was only drinking Gatorade and water.  She also reported that she had no flatus or bowel movements also within 1 week. NG tube attempt was made in the ER however unsuccessful and IR guided placement was ordered. General surgery was also consulted as CT abdomen/pelvis showed a small bowel obstruction in the right lower quadrant with transition point.   Interval History:  Seen in ER this am. Still having some nausea but no vomiting. NG tube was unable to be placed and she is awaiting placement with radiology. Her abdomen is slightly more distended she says than typical. She also states that it has been about 1 week since she was able to eat food and she has been mainly taking in Gatorade and water.  Old records reviewed in assessment of this patient  ROS: Constitutional: negative for chills and fevers, Respiratory: negative for cough, Cardiovascular: negative for chest pain and Gastrointestinal: positive for abdominal pain and nausea  Assessment & Plan: * SBO (small bowel obstruction) (HCC) - CT abd/pelvis on admission shows SBO with transition point in RLQ; etiology likely adhesions from prior abdominal surgeries (03/2019 and 08/2019 for Hartmann's and reversal) -Unsuccessful NGT placement in the ER -Placement ordered with radiology -Keep n.p.o. -Continue fluids -Continue antiemetics and pain control - approaching probable need for TPN as she is reporting ~1 week since she ate (was only drinking gatorade/water); labs are not dramatically concerning  yet; trend Mg, Phos and followup prealbumin) - from a surgical risk perspective she is low risk (per NSQIP and RCRI) for withstanding stress of surgery. Would benefit from early PT as she is higher risk for needing rehab/SNF after surgery for recovery, but otherwise given 2 prior abdominal surgeries in 2020 and 2021, she has tolerated them well.   Macrocytosis without anemia - no anemia noted but has been in the past; may be hemoconcentrated if hasn't eaten in ~1 week  - trend CBC while on IVF - follow up B12, folate  Thrombocytopenia (HCC) -Appears to have a normal platelet count looking back several years.  Mild thrombocytopenia started around February 2021 at her last surgery -Platelet count on admission 136k - no bleeding - monitor for now  Hypokalemia - replete and recheck as needed  Hiccups - supportive care for now  COPD (chronic obstructive pulmonary disease) ([place]) - no s/s exacerbation   Diverticulosis of colon - s/p hx of Hartmann's procedure for obstructed diverticulitis     Antimicrobials: N/A  DVT prophylaxis: Lovenox Code Status: Full Family Communication: None present Disposition Plan: Status is: Inpatient  Remains inpatient appropriate because:Ongoing diagnostic testing needed not appropriate for outpatient work up, Unsafe d/c plan, IV treatments appropriate due to intensity of illness or inability to take PO and Inpatient level of care appropriate due to severity of illness   Dispo: The patient is from: Home              Anticipated d/c is to: Home  Anticipated d/c date is: > 3 days              Patient currently is not medically stable to d/c.   Objective: Blood pressure (!) 151/63, pulse 75, temperature 98.3 F (36.8 C), temperature source Oral, resp. rate 18, height 5' (1.524 m), weight 61.2 kg, SpO2 96 %.  Examination: General appearance: alert, cooperative and no distress Head: Normocephalic, without obvious abnormality,  atraumatic Eyes: EOMI Lungs: clear to auscultation bilaterally Heart: regular rate and rhythm and S1, S2 normal Abdomen: Mildly distended, soft, mild nonspecific tenderness to palpation throughout. Hypoactive bowel sounds Extremities: No edema Skin: mobility and turgor normal Neurologic: Grossly normal  Consultants:   General surgery  Procedures:     Data Reviewed: I have personally reviewed following labs and imaging studies Results for orders placed or performed during the hospital encounter of 07/19/20 (from the past 24 hour(s))  POC occult blood, ED     Status: None   Collection Time: 07/19/20 11:38 PM  Result Value Ref Range   Fecal Occult Bld NEGATIVE NEGATIVE  Resp Panel by RT-PCR (Flu A&B, Covid) Nasopharyngeal Swab     Status: None   Collection Time: 07/20/20  3:05 AM   Specimen: Nasopharyngeal Swab; Nasopharyngeal(NP) swabs in vial transport medium  Result Value Ref Range   SARS Coronavirus 2 by RT PCR NEGATIVE NEGATIVE   Influenza A by PCR NEGATIVE NEGATIVE   Influenza B by PCR NEGATIVE NEGATIVE  CBC     Status: Abnormal   Collection Time: 07/20/20  6:11 AM  Result Value Ref Range   WBC 7.2 4.0 - 10.5 K/uL   RBC 4.03 3.87 - 5.11 MIL/uL   Hemoglobin 14.6 12.0 - 15.0 g/dL   HCT 43.8 36.0 - 46.0 %   MCV 108.7 (H) 80.0 - 100.0 fL   MCH 36.2 (H) 26.0 - 34.0 pg   MCHC 33.3 30.0 - 36.0 g/dL   RDW 13.8 11.5 - 15.5 %   Platelets 130 (L) 150 - 400 K/uL   nRBC 0.0 0.0 - 0.2 %  Basic metabolic panel     Status: Abnormal   Collection Time: 07/20/20  6:11 AM  Result Value Ref Range   Sodium 139 135 - 145 mmol/L   Potassium 3.6 3.5 - 5.1 mmol/L   Chloride 99 98 - 111 mmol/L   CO2 26 22 - 32 mmol/L   Glucose, Bld 112 (H) 70 - 99 mg/dL   BUN 28 (H) 8 - 23 mg/dL   Creatinine, Ser 0.59 0.44 - 1.00 mg/dL   Calcium 9.7 8.9 - 10.3 mg/dL   GFR, Estimated >60 >60 mL/min   Anion gap 14 5 - 15  Magnesium     Status: Abnormal   Collection Time: 07/20/20  6:11 AM  Result  Value Ref Range   Magnesium 2.5 (H) 1.7 - 2.4 mg/dL    Recent Results (from the past 240 hour(s))  Resp Panel by RT-PCR (Flu A&B, Covid) Nasopharyngeal Swab     Status: None   Collection Time: 07/17/20  9:18 AM   Specimen: Nasopharyngeal Swab; Nasopharyngeal(NP) swabs in vial transport medium  Result Value Ref Range Status   SARS Coronavirus 2 by RT PCR NEGATIVE NEGATIVE Final    Comment: (NOTE) SARS-CoV-2 target nucleic acids are NOT DETECTED.  The SARS-CoV-2 RNA is generally detectable in upper respiratory specimens during the acute phase of infection. The lowest concentration of SARS-CoV-2 viral copies this assay can detect is 138 copies/mL. A negative result does  not preclude SARS-Cov-2 infection and should not be used as the sole basis for treatment or other patient management decisions. A negative result may occur with  improper specimen collection/handling, submission of specimen other than nasopharyngeal swab, presence of viral mutation(s) within the areas targeted by this assay, and inadequate number of viral copies(<138 copies/mL). A negative result must be combined with clinical observations, patient history, and epidemiological information. The expected result is Negative.  Fact Sheet for Patients:  EntrepreneurPulse.com.au  Fact Sheet for Healthcare Providers:  IncredibleEmployment.be  This test is no t yet approved or cleared by the [place] FDA and  has been authorized for detection and/or diagnosis of SARS-CoV-2 by FDA under an Emergency Use Authorization (EUA). This EUA will remain  in effect (meaning this test can be used) for the duration of the COVID-19 declaration under Section 564(b)(1) of the Act, 21 U.S.C.section 360bbb-3(b)(1), unless the authorization is terminated  or revoked sooner.       Influenza A by PCR NEGATIVE NEGATIVE Final   Influenza B by PCR NEGATIVE NEGATIVE Final    Comment: (NOTE) The Xpert  Xpress SARS-CoV-2/FLU/RSV plus assay is intended as an aid in the diagnosis of influenza from Nasopharyngeal swab specimens and should not be used as a sole basis for treatment. Nasal washings and aspirates are unacceptable for Xpert Xpress SARS-CoV-2/FLU/RSV testing.  Fact Sheet for Patients: EntrepreneurPulse.com.au  Fact Sheet for Healthcare Providers: IncredibleEmployment.be  This test is not yet approved or cleared by the [place] FDA and has been authorized for detection and/or diagnosis of SARS-CoV-2 by FDA under an Emergency Use Authorization (EUA). This EUA will remain in effect (meaning this test can be used) for the duration of the COVID-19 declaration under Section 564(b)(1) of the Act, 21 U.S.C. section 360bbb-3(b)(1), unless the authorization is terminated or revoked.  Performed at [facility], [place] [address] [place],  [address]   Resp Panel by RT-PCR (Flu A&B, Covid) Nasopharyngeal Swab     Status: None   Collection Time: 07/20/20  3:05 AM   Specimen: Nasopharyngeal Swab; Nasopharyngeal(NP) swabs in vial transport medium  Result Value Ref Range Status   SARS Coronavirus 2 by RT PCR NEGATIVE NEGATIVE Final    Comment: (NOTE) SARS-CoV-2 target nucleic acids are NOT DETECTED.  The SARS-CoV-2 RNA is generally detectable in upper respiratory specimens during the acute phase of infection. The lowest concentration of SARS-CoV-2 viral copies this assay can detect is 138 copies/mL. A negative result does not preclude SARS-Cov-2 infection and should not be used as the sole basis for treatment or other patient management decisions. A negative result may occur with  improper specimen collection/handling, submission of specimen other than nasopharyngeal swab, presence of viral mutation(s) within the areas targeted by this assay, and inadequate number of viral copies(<138 copies/mL). A negative result must be  combined with clinical observations, patient history, and epidemiological information. The expected result is Negative.  Fact Sheet for Patients:  EntrepreneurPulse.com.au  Fact Sheet for Healthcare Providers:  IncredibleEmployment.be  This test is no t yet approved or cleared by the [place] FDA and  has been authorized for detection and/or diagnosis of SARS-CoV-2 by FDA under an Emergency Use Authorization (EUA). This EUA will remain  in effect (meaning this test can be used) for the duration of the COVID-19 declaration under Section 564(b)(1) of the Act, 21 U.S.C.section 360bbb-3(b)(1), unless the authorization is terminated  or revoked sooner.       Influenza A by PCR NEGATIVE NEGATIVE  Final   Influenza B by PCR NEGATIVE NEGATIVE Final    Comment: (NOTE) The Xpert Xpress SARS-CoV-2/FLU/RSV plus assay is intended as an aid in the diagnosis of influenza from Nasopharyngeal swab specimens and should not be used as a sole basis for treatment. Nasal washings and aspirates are unacceptable for Xpert Xpress SARS-CoV-2/FLU/RSV testing.  Fact Sheet for Patients: EntrepreneurPulse.com.au  Fact Sheet for Healthcare Providers: IncredibleEmployment.be  This test is not yet approved or cleared by the [place] FDA and has been authorized for detection and/or diagnosis of SARS-CoV-2 by FDA under an Emergency Use Authorization (EUA). This EUA will remain in effect (meaning this test can be used) for the duration of the COVID-19 declaration under Section 564(b)(1) of the Act, 21 U.S.C. section 360bbb-3(b)(1), unless the authorization is terminated or revoked.  Performed at [facility], [place] [address] [place], [place] [address]      Radiology Studies: CT ABDOMEN PELVIS W CONTRAST  Result Date: 07/20/2020 CLINICAL DATA:  Initial evaluation for acute abdominal pain and distension.  EXAM: CT ABDOMEN AND PELVIS WITH CONTRAST TECHNIQUE: Multidetector CT imaging of the abdomen and pelvis was performed using the standard protocol following bolus administration of intravenous contrast. CONTRAST:  153mL OMNIPAQUE IOHEXOL 300 MG/ML  SOLN COMPARISON:  Prior CT from 03/11/2019. FINDINGS: Lower chest: Trace layering bilateral pleural effusions with associated atelectasis. Scattered coronary artery calcifications noted. Visualized lungs are otherwise clear. Hepatobiliary: Few scattered small hepatic cysts noted, stable from previous. Liver otherwise unremarkable. Gallbladder within normal limits. No biliary dilatation. Pancreas: Pancreas within normal limits. Spleen: Spleen within normal limits. Adrenals/Urinary Tract: Adrenal glands are normal. Kidneys equal size with symmetric enhancement. Few scattered subcentimeter hypodense lesions noted within the kidneys, too small the characterize, but statistically likely reflects small cyst. No nephrolithiasis or hydronephrosis. No focal renal mass. No visible hydroureter. Partially distended bladder within normal limits. Stomach/Bowel: Mild circumferential wall thickening about the visualized esophagus. Stomach within normal limits. Multiple prominent loops of small bowel seen within the left and mid abdomen, measuring up to 3.9 cm in diameter. Associated internal air-fluid levels. Focal transition point within the right lower quadrant (series 5, image 60). Small bowel is decompressed distally. Finding consistent with a mechanical small bowel obstruction. Underlying adhesive disease is suspected. No pneumatosis or portal venous gas. Appendix is normal. Colon is diffusely decompressed. Scattered colonic diverticulosis involving the right colon without diverticulitis. Sequelae of prior partial colectomy noted at the left lower quadrant. Vascular/Lymphatic: Moderate aorto bi-iliac atherosclerotic disease. No aneurysm. Mesenteric vessels grossly patent proximally.  No adenopathy. Reproductive: Uterus within normal limits. Ovaries are atrophic in appearance bilaterally. Few small dystrophic calcifications noted involving the left ovary. No adnexal mass. Other: Trace free fluid seen adjacent to the dilated loops of bowel as well as adjacent to the spleen, likely reactive. No free air. Small fat containing paraumbilical hernia noted. Musculoskeletal: No acute osseous finding. No discrete or worrisome osseous lesions. IMPRESSION: 1. Findings consistent with a mechanical small bowel obstruction with transition point in the right lower quadrant. Underlying adhesive disease is suspected. 2. Trace layering bilateral pleural effusions with associated atelectasis. 3. Colonic diverticulosis without diverticulitis. 4. Aortic Atherosclerosis (ICD10-I70.0). Electronically Signed   By: Jeannine Boga M.D.   On: 07/20/2020 02:47   DG Abd Portable 1V-Small Bowel Protocol-Position Verification  Result Date: 07/20/2020 CLINICAL DATA:  Status post NG tube placement. EXAM: PORTABLE ABDOMEN - 1 VIEW COMPARISON:  CT abdomen from earlier same day. FINDINGS: Nasogastric tube a is positioned within the  stomach, with proximal side holes just below the level of the gastroesophageal junction. Distended gas-filled small bowel loops are seen throughout the abdomen and pelvis, compatible with obstruction. IMPRESSION: 1. Nasogastric tube is positioned within the stomach, but proximal side holes are located at or just below the level of the gastroesophageal junction. Consider advancing the nasogastric tube 5-10 cm for optimal radiographic positioning. 2. Small bowel obstruction. Electronically Signed   By: Franki Cabot M.D.   On: 07/20/2020 13:04   DG [place] Dys Tube Plc W/Fl W/Rad  Result Date: 07/20/2020 CLINICAL DATA:  Nasogastric placement unsuccessful on the floor. Mechanical small bowel obstruction. EXAM: NASO G TUBE PLACEMENT WITH FL AND WITH RAD CONTRAST:  None FLUOROSCOPY TIME:  Fluoroscopy  Time: 4 minutes 6 seconds. 903.15 micro gray meter squared COMPARISON:  None. FINDINGS: Nasogastric tube was placed through the left nostril, reaching the stomach without difficulty. I was unable to direct the nasogastric tube into the proximal small intestine and the tube was secured in place with its tip in the gastric fundus. IMPRESSION: Nasogastric tube placed through the left nostril with the tip in the gastric fundus. I was unable to direct the tube into the small intestine. Electronically Signed   By: Nelson Chimes M.D.   On: 07/20/2020 13:13   DG Abd Portable 1V-Small Bowel Protocol-Position Verification  Final Result    DG Naso G Tube Plc W/Fl W/Rad  Final Result    CT ABDOMEN PELVIS W CONTRAST  Final Result    DG Abd Portable 1V-Small Bowel Obstruction Protocol-initial, 8 hr delay    (Results Pending)  IR INTRO LONG GI TUBE    (Results Pending)    Scheduled Meds: . bisacodyl  10 mg Rectal Daily  . enoxaparin (LOVENOX) injection  40 mg Subcutaneous Q24H  . lip balm  1 application Topical BID   PRN Meds: acetaminophen, alum & mag hydroxide-simeth, HYDROmorphone (DILAUDID) injection, lactated ringers, magic mouthwash, menthol-cetylpyridinium, methocarbamol (ROBAXIN) IV, ondansetron (ZOFRAN) IV **OR** ondansetron (ZOFRAN) IV, ondansetron **OR** [DISCONTINUED] ondansetron (ZOFRAN) IV, phenol, prochlorperazine, simethicone Continuous Infusions: . lactated ringers    . lactated ringers 100 mL/hr at 07/20/20 0615  . methocarbamol (ROBAXIN) IV    . ondansetron (ZOFRAN) IV       LOS: 0 days  Time spent: Greater than 50% of the 35 minute visit was spent in counseling/coordination of care for the patient as laid out in the A&P.   Dwyane Dee, MD [facility] 07/20/2020, 4:47 PM

## 2020-07-20 NOTE — Consult Note (Signed)
Theresa Arnold  [id number]  CARE TEAM:  PCP: Theresa Borg, MD  Outpatient Care Team: Patient Care Team: Theresa Borg, MD as PCP - General  Inpatient Treatment Team: Treatment Team: Attending Provider: Dwyane Dee, MD; Respiratory Therapist: Nelly Arnold, RRT; Rounding Team: Theresa Berkshire, MD; Registered Nurse: Theresa Catholic, RN; Consulting Physician: Theresa Pace, Md, MD   This patient is a 87 y.o.female who presents today for surgical evaluation at the request of Theresa Arnold.   Chief complaint / Reason for evaluation: SBO  Elderly woman with history of diverticulitis requiring Hartmann resection with colostomySept 2020 and eventually colostomy takedown Feb 2021 by Theresa Theresa Arnold with our group.  Patient notes over the past week that she has had difficulty with being constipated. Increased bloating and discomfort. Not able to pass gas. Vomiting more often. She thought she noticed dark output one time. Based on concerns he came to the emergency room. Abdomen very distended. Exam and CT scan concerning for small bowel obstruction. Surgical or medical consultations requested. Tried to place nasogastric tube the patient could not tolerate. They are hoping they can get radiology/fluoroscopy to do it.  No personal nor family history of GI/colon cancer, inflammatory bowel disease, irritable bowel syndrome, allergy such as Celiac Sprue, dietary/dairy problems, colitis, ulcers nor gastritis.  No recent sick contacts/gastroenteritis.  No travel outside the country.  No changes in diet.  No dysphagia to solids or liquids.  No significant heartburn or reflux.  No hematochezia, hematemesis, coffee ground emesis.  No evidence of prior gastric/peptic ulceration.   Assessment  Theresa Arnold  87 y.o. female       Problem List:  Principal Problem:   SBO (small bowel obstruction) (HCC) Active Problems:   Thrombocytopenia (HCC)   Nausea and vomiting   Hypokalemia   Small bowel  obstruction most likely due to adhesions  Plan:  IV fluids  Nausea and pain control.  Hospitalization.  Nasogastric tube decompression.  Apparently nursing not able to do.  See if fluoroscopy can do it in the radiology department since she is rather distended and hiccuping.  Small bowel protocol.  Hopefully she can decompress nonoperatively but if does not improve or worsens, may require operative intervention.  Try to hold off given her advanced age.  Operative risk decreased although she was able to survive emergency Hartman resection and colostomy takedown in the past 16 months.  We will see  -VTE prophylaxis- SCDs, etc -mobilize as tolerated to help recovery  40 minutes spent in review, evaluation, examination, counseling, and coordination of care.  More than 50% of that time was spent in counseling.  Theresa Hector, MD, FACS, MASCRS Gastrointestinal and Minimally Invasive Surgery  [facility] [address], [place],  [address] [phone] Fax [phone] Main/Paging  CONTACT INFORMATION: Weekday (9AM-5PM) concerns: Call [facility] main office at [phone] Weeknight (5PM-9AM) or Weekend/Holiday concerns: Check [email or web address] for General Surgery [facility] coverage (Please, do not use SecureChat as it is not reliable communication to operating surgeons for immediate patient care)      07/20/2020      Past Medical History:  Diagnosis Date  . CERVICAL RADICULOPATHY, LEFT 09/03/2010  . Cervical spondylosis 11/10/2011  . COPD (chronic obstructive pulmonary disease) ([place]) 11/10/2011  . Diverticulitis   . HYPERLIPIDEMIA 09/03/2010    Past Surgical History:  Procedure Laterality Date  . COLONOSCOPY    . COLOSTOMY REVERSAL N/A 09/04/2019   Procedure: PRIMARY COLOCOLOSTOMY REVERSAL  AND TAKEDOWN OF HARTMAN;  Surgeon: Theresa Hausen, MD;  Location: [facility];  Service: General;  Laterality: N/A;  . EYE SURGERY     bilateral cataracts with lens  implants  . LAPAROSCOPIC PARTIAL COLECTOMY N/A 03/22/2019   Procedure: LAPAROSCOPY, SPLENIC FLEXURE MOBILIZATION WITH OPEN HARTMANN PROCEDURE;  Surgeon: Theresa Hausen, MD;  Location: [facility];  Service: General;  Laterality: N/A;    Social History   Socioeconomic History  . Marital status: Married    Spouse name: Theresa Arnold  . Number of children: 1  . Years of education: 39  . Highest education level: Not on file  Occupational History  . Occupation: retired  Tobacco Use  . Smoking status: Former Smoker    Packs/day: 1.00    Years: 30.00    Pack years: 30.00    Types: Cigarettes    Quit date: 07/14/2011    Years since quitting: 9.0  . Smokeless tobacco: Never Used  . Tobacco comment: QUIT 2013   Vaping Use  . Vaping Use: Never used  Substance and Sexual Activity  . Alcohol use: Yes    Comment: wine occasionally  . Drug use: No  . Sexual activity: Not on file  Other Topics Concern  . Not on file  Social History Narrative   Patient lives at home with spouseSharman Arnold)   Patient is retired.   Patient has a college education.   Patient has one adult child.   Patient is right-handed   Caffeine Use: 1 cup daily   Social Determinants of Health   Financial Resource Strain: Not on file  Food Insecurity: Not on file  Transportation Needs: Not on file  Physical Activity: Not on file  Stress: Not on file  Social Connections: Not on file  Intimate Partner Violence: Not on file    Family History  Problem Relation Age of Onset  . Colon cancer Sister   . Breast cancer Sister   . Heart disease Father   . Hip fracture Mother   . Hypertension Other     Current Facility-Administered Medications  Medication Dose Route Frequency Provider Last Rate Last Admin  . diatrizoate meglumine-sodium (GASTROGRAFIN) 66-10 % solution 90 mL  90 mL Per NG tube Once Theresa Boston, MD      . enoxaparin (LOVENOX) injection 40 mg  40 mg Subcutaneous Q24H Theresa Arnold, Yevonne Aline, MD      . HYDROmorphone  (DILAUDID) injection 0.5 mg  0.5 mg Intravenous Q3H PRN Theresa Arnold, Yevonne Aline, MD      . lactated ringers infusion   Intravenous Continuous Theresa Arnold, Yevonne Aline, MD 100 mL/hr at 07/20/20 0615 New Bag at 07/20/20 0615  . ondansetron (ZOFRAN) tablet 4 mg  4 mg Oral Q6H PRN Theresa Arnold, Yevonne Aline, MD       Or  . ondansetron (ZOFRAN) injection 4 mg  4 mg Intravenous Q6H PRN Theresa Arnold, Yevonne Aline, MD       Current Outpatient Medications  Medication Sig Dispense Refill  . acetaminophen (TYLENOL) 650 MG CR tablet Take 1,300 mg by mouth every 8 (eight) hours as needed for pain.    Marland Kitchen aspirin 325 MG tablet Take 325 mg by mouth every 6 (six) hours as needed for mild pain.    Marland Kitchen HYDROcodone-acetaminophen (HYCET) 7.5-325 mg/15 ml solution Take 15 mLs by mouth 4 (four) times daily as needed for moderate pain. (Patient not taking: Reported on 07/17/2020) 120 mL 0  . ondansetron (ZOFRAN) 4 MG tablet Take 1 tablet (4 mg total) by mouth  every 6 (six) hours as needed for nausea or vomiting. 30 tablet 1     No Known Allergies  ROS:   All other systems reviewed & are negative except per HPI or as noted below: Constitutional:  No fevers, chills, sweats.  Weight stable Eyes:  No vision changes, No discharge HENT:  No sore throats, nasal drainage Lymph: No neck swelling, No bruising easily Pulmonary:  No cough, productive sputum CV: No orthopnea, PND  Patient walks 10 minutes for about 1/4 miles without difficulty.  No exertional chest/neck/shoulder/arm pain. GI: No personal nor family history of GI/colon cancer, inflammatory bowel disease, irritable bowel syndrome, allergy such as Celiac Sprue, dietary/dairy problems, colitis, ulcers nor gastritis.  No recent sick contacts/gastroenteritis.  No travel outside the country.  No changes in diet. Renal: No UTIs, No hematuria Genital:  No drainage, bleeding, masses Musculoskeletal: No severe joint pain.  Good ROM major joints Skin:  No sores or lesions.  No rashes Heme/Lymph:   No easy bleeding.  No swollen lymph nodes Neuro: No focal weakness/numbness.  No seizures Psych: No suicidal ideation.  No hallucinations  BP (!) 146/67   Pulse 84   Temp 98.3 F (36.8 C) (Oral)   Resp (!) 23   Ht 5' (1.524 m)   Wt 61.2 kg   SpO2 95%   BMI 26.37 kg/m   Physical Exam: Constitutional: Not cachectic.  Hygeine adequate.  Vitals signs as above.  Looks bloated.  Occasionally hiccuping Eyes: Pupils reactive, normal extraocular movements. Sclera nonicteric Neuro: CN II-XII intact.  No major focal sensory defects.  No major motor deficits. Lymph: No head/neck/groin lymphadenopathy Psych:  No severe agitation.  No severe anxiety.  Judgment & insight Adequate, Oriented x4, HENT: Normocephalic, Mucus membranes moist.  No thrush.   Neck: Supple, No tracheal deviation.  No obvious thyromegaly Chest: No pain to chest wall compression.  Good respiratory excursion.  No audible wheezing CV:  Pulses intact.  Regular rhythm.  No major extremity edema Abdomen:  Somewhat firm.  Moderately distended.  Nontender. No incarcerated hernias.  No hepatomegaly.  No splenomegaly Gen:  No inguinal hernias.  No inguinal lymphadenopathy.   Ext: No obvious deformity or contracture no significant edema.  No cyanosis Skin: No major subcutaneous nodules.  Warm and dry Musculoskeletal: Severe joint rigidity not present.  No obvious clubbing.  No digital petechiae.     Results:   Labs: Results for orders placed or performed during the hospital encounter of 07/19/20 (from the past 48 hour(s))  Comprehensive metabolic panel     Status: Abnormal   Collection Time: 07/19/20  3:35 PM  Result Value Ref Range   Sodium 139 135 - 145 mmol/L   Potassium 3.3 (L) 3.5 - 5.1 mmol/L   Chloride 99 98 - 111 mmol/L   CO2 24 22 - 32 mmol/L   Glucose, Bld 144 (H) 70 - 99 mg/dL    Comment: Glucose reference range applies only to samples taken after fasting for at least 8 hours.   BUN 27 (H) 8 - 23 mg/dL    Creatinine, Ser 0.65 0.44 - 1.00 mg/dL   Calcium 9.4 8.9 - 10.3 mg/dL   Total Protein 8.2 (H) 6.5 - 8.1 g/dL   Albumin 4.2 3.5 - 5.0 g/dL   AST 61 (H) 15 - 41 U/L   ALT 28 0 - 44 U/L   Alkaline Phosphatase 56 38 - 126 U/L   Total Bilirubin 1.6 (H) 0.3 - 1.2 mg/dL   GFR,  Estimated >60 >60 mL/min    Comment: (NOTE) Calculated using the CKD-EPI Creatinine Equation (2021)    Anion gap 16 (H) 5 - 15    Comment: Performed at [facility], [place] [address], [place], [place] [address]  CBC     Status: Abnormal   Collection Time: 07/19/20  3:35 PM  Result Value Ref Range   WBC 8.7 4.0 - 10.5 K/uL   RBC 4.13 3.87 - 5.11 MIL/uL   Hemoglobin 14.9 12.0 - 15.0 g/dL   HCT 45.4 36.0 - 46.0 %   MCV 109.9 (H) 80.0 - 100.0 fL   MCH 36.1 (H) 26.0 - 34.0 pg   MCHC 32.8 30.0 - 36.0 g/dL   RDW 13.9 11.5 - 15.5 %   Platelets 126 (L) 150 - 400 K/uL   nRBC 0.0 0.0 - 0.2 %    Comment: Performed at [facility], [place] [address] [place], [place] [address]  Type and screen [place]     Status: None   Collection Time: 07/19/20  3:35 PM  Result Value Ref Range   ABO/RH(D) A POS    Antibody Screen NEG    Sample Expiration      07/22/2020,2359 Performed at [facility], [place] [address], [place], [place] [address]   POC occult blood, ED     Status: None   Collection Time: 07/19/20 11:38 PM  Result Value Ref Range   Fecal Occult Bld NEGATIVE NEGATIVE  Resp Panel by RT-PCR (Flu A&B, Covid) Nasopharyngeal Swab     Status: None   Collection Time: 07/20/20  3:05 AM   Specimen: Nasopharyngeal Swab; Nasopharyngeal(NP) swabs in vial transport medium  Result Value Ref Range   SARS Coronavirus 2 by RT PCR NEGATIVE NEGATIVE    Comment: (NOTE) SARS-CoV-2 target nucleic acids are NOT DETECTED.  The SARS-CoV-2 RNA is generally detectable in upper respiratory specimens during the acute phase of infection. The lowest concentration of  SARS-CoV-2 viral copies this assay can detect is 138 copies/mL. A negative result does not preclude SARS-Cov-2 infection and should not be used as the sole basis for treatment or other patient management decisions. A negative result may occur with  improper specimen collection/handling, submission of specimen other than nasopharyngeal swab, presence of viral mutation(s) within the areas targeted by this assay, and inadequate number of viral copies(<138 copies/mL). A negative result must be combined with clinical observations, patient history, and epidemiological information. The expected result is Negative.  Fact Sheet for Patients:  EntrepreneurPulse.com.au  Fact Sheet for Healthcare Providers:  IncredibleEmployment.be  This test is no t yet approved or cleared by the [place] FDA and  has been authorized for detection and/or diagnosis of SARS-CoV-2 by FDA under an Emergency Use Authorization (EUA). This EUA will remain  in effect (meaning this test can be used) for the duration of the COVID-19 declaration under Section 564(b)(1) of the Act, 21 U.S.C.section 360bbb-3(b)(1), unless the authorization is terminated  or revoked sooner.       Influenza A by PCR NEGATIVE NEGATIVE   Influenza B by PCR NEGATIVE NEGATIVE    Comment: (NOTE) The Xpert Xpress SARS-CoV-2/FLU/RSV plus assay is intended as an aid in the diagnosis of influenza from Nasopharyngeal swab specimens and should not be used as a sole basis for treatment. Nasal washings and aspirates are unacceptable for Xpert Xpress SARS-CoV-2/FLU/RSV testing.  Fact Sheet for Patients: EntrepreneurPulse.com.au  Fact Sheet for Healthcare Providers: IncredibleEmployment.be  This test is  not yet approved or cleared by the [place] and has been authorized for detection and/or diagnosis of SARS-CoV-2 by FDA under an Emergency Use Authorization (EUA).  This EUA will remain in effect (meaning this test can be used) for the duration of the COVID-19 declaration under Section 564(b)(1) of the Act, 21 U.S.C. section 360bbb-3(b)(1), unless the authorization is terminated or revoked.  Performed at [facility], [place] [address] [place], [place] [address]   CBC     Status: Abnormal   Collection Time: 07/20/20  6:11 AM  Result Value Ref Range   WBC 7.2 4.0 - 10.5 K/uL   RBC 4.03 3.87 - 5.11 MIL/uL   Hemoglobin 14.6 12.0 - 15.0 g/dL   HCT 43.8 36.0 - 46.0 %   MCV 108.7 (H) 80.0 - 100.0 fL   MCH 36.2 (H) 26.0 - 34.0 pg   MCHC 33.3 30.0 - 36.0 g/dL   RDW 13.8 11.5 - 15.5 %   Platelets 130 (L) 150 - 400 K/uL    Comment: REPEATED TO VERIFY CONSISTENT WITH PREVIOUS RESULT    nRBC 0.0 0.0 - 0.2 %    Comment: Performed at [facility], [place] [address] [place], [place] [address]  Basic metabolic panel     Status: Abnormal   Collection Time: 07/20/20  6:11 AM  Result Value Ref Range   Sodium 139 135 - 145 mmol/L   Potassium 3.6 3.5 - 5.1 mmol/L   Chloride 99 98 - 111 mmol/L   CO2 26 22 - 32 mmol/L   Glucose, Bld 112 (H) 70 - 99 mg/dL    Comment: Glucose reference range applies only to samples taken after fasting for at least 8 hours.   BUN 28 (H) 8 - 23 mg/dL   Creatinine, Ser 0.59 0.44 - 1.00 mg/dL   Calcium 9.7 8.9 - 10.3 mg/dL   GFR, Estimated >60 >60 mL/min    Comment: (NOTE) Calculated using the CKD-EPI Creatinine Equation (2021)    Anion gap 14 5 - 15    Comment: Performed at [facility], [place] [address], [place], [place] [address]  Magnesium     Status: Abnormal   Collection Time: 07/20/20  6:11 AM  Result Value Ref Range   Magnesium 2.5 (H) 1.7 - 2.4 mg/dL    Comment: Performed at [facility], [place] [address] [place], [place] [address]    Imaging / Studies: CT ABDOMEN PELVIS W CONTRAST  Result Date: 07/20/2020 CLINICAL DATA:  Initial evaluation for  acute abdominal pain and distension. EXAM: CT ABDOMEN AND PELVIS WITH CONTRAST TECHNIQUE: Multidetector CT imaging of the abdomen and pelvis was performed using the standard protocol following bolus administration of intravenous contrast. CONTRAST:  120mL OMNIPAQUE IOHEXOL 300 MG/ML  SOLN COMPARISON:  Prior CT from 03/11/2019. FINDINGS: Lower chest: Trace layering bilateral pleural effusions with associated atelectasis. Scattered coronary artery calcifications noted. Visualized lungs are otherwise clear. Hepatobiliary: Few scattered small hepatic cysts noted, stable from previous. Liver otherwise unremarkable. Gallbladder within normal limits. No biliary dilatation. Pancreas: Pancreas within normal limits. Spleen: Spleen within normal limits. Adrenals/Urinary Tract: Adrenal glands are normal. Kidneys equal size with symmetric enhancement. Few scattered subcentimeter hypodense lesions noted within the kidneys, too small the characterize, but statistically likely reflects small cyst. No nephrolithiasis or hydronephrosis. No focal renal mass. No visible hydroureter. Partially distended bladder within normal limits. Stomach/Bowel: Mild circumferential wall thickening about the visualized esophagus. Stomach within normal limits. Multiple prominent loops  of small bowel seen within the left and mid abdomen, measuring up to 3.9 cm in diameter. Associated internal air-fluid levels. Focal transition point within the right lower quadrant (series 5, image 60). Small bowel is decompressed distally. Finding consistent with a mechanical small bowel obstruction. Underlying adhesive disease is suspected. No pneumatosis or portal venous gas. Appendix is normal. Colon is diffusely decompressed. Scattered colonic diverticulosis involving the right colon without diverticulitis. Sequelae of prior partial colectomy noted at the left lower quadrant. Vascular/Lymphatic: Moderate aorto bi-iliac atherosclerotic disease. No aneurysm.  Mesenteric vessels grossly patent proximally. No adenopathy. Reproductive: Uterus within normal limits. Ovaries are atrophic in appearance bilaterally. Few small dystrophic calcifications noted involving the left ovary. No adnexal mass. Other: Trace free fluid seen adjacent to the dilated loops of bowel as well as adjacent to the spleen, likely reactive. No free air. Small fat containing paraumbilical hernia noted. Musculoskeletal: No acute osseous finding. No discrete or worrisome osseous lesions. IMPRESSION: 1. Findings consistent with a mechanical small bowel obstruction with transition point in the right lower quadrant. Underlying adhesive disease is suspected. 2. Trace layering bilateral pleural effusions with associated atelectasis. 3. Colonic diverticulosis without diverticulitis. 4. Aortic Atherosclerosis (ICD10-I70.0). Electronically Signed   By: Jeannine Boga M.D.   On: 07/20/2020 02:47    Medications / Allergies: per chart  Antibiotics: Anti-infectives (From admission, onward)   None        Note: Portions of this report may have been transcribed using voice recognition software. Every effort was made to ensure accuracy; however, inadvertent computerized transcription errors may be present.   Any transcriptional errors that result from this process are unintentional.    Theresa Hector, MD, FACS, MASCRS Gastrointestinal and Minimally Invasive Surgery  [facility] [address] [place], [place] [address] [phone] Fax [phone] Main/Paging  CONTACT INFORMATION: Weekday (9AM-5PM) concerns: Call [facility] main office at [phone] Weeknight (5PM-9AM) or Weekend/Holiday concerns: Check [email or web address] for General Surgery [facility] coverage (Please, do not use SecureChat as it is not reliable communication to operating surgeons for immediate patient care)      07/20/2020  9:56 AM

## 2020-07-20 NOTE — Assessment & Plan Note (Addendum)
-   CT abd/pelvis on admission shows SBO with transition point in RLQ; etiology likely adhesions from prior abdominal surgeries (03/2019 and 08/2019 for Hartmann's and reversal) - s/p NGT placement with radiology on 1/8 -Continue fluids -Continue antiemetics and pain control - meets criteria for TPN; she's essentially 1 week from last nutrition intake at home and now is s/p ex lap on 1/10 with u/k length of time for return of bowel function. Pharmacy consult for TPN. PICC line ordered for placement - monitor labs s/p TPN

## 2020-07-20 NOTE — Assessment & Plan Note (Signed)
-   no s/s exacerbation 

## 2020-07-20 NOTE — ED Notes (Signed)
Attempted NG tube after previous nurse attempted x2. No success. Unable to pass due to resistance in OPA. Tube continued to curl and attempted to come out of patient's mouth.

## 2020-07-21 DIAGNOSIS — K56609 Unspecified intestinal obstruction, unspecified as to partial versus complete obstruction: Secondary | ICD-10-CM | POA: Diagnosis not present

## 2020-07-21 LAB — FOLATE: Folate: 10.9 ng/mL (ref >5.9)

## 2020-07-21 LAB — CBC WITH DIFFERENTIAL/PLATELET
Abs Immature Granulocytes: 0.03 10*3/uL (ref 0.00–0.07)
Basophils Absolute: 0 10*3/uL (ref 0.0–0.1)
Basophils Relative: 0 %
Eosinophils Absolute: 0 10*3/uL (ref 0.0–0.5)
Eosinophils Relative: 0 %
HCT: 40.4 % (ref 36.0–46.0)
Hemoglobin: 13.7 g/dL (ref 12.0–15.0)
Immature Granulocytes: 0 %
Lymphocytes Relative: 16 %
Lymphs Abs: 1.3 10*3/uL (ref 0.7–4.0)
MCH: 36.4 pg — ABNORMAL HIGH (ref 26.0–34.0)
MCHC: 33.9 g/dL (ref 30.0–36.0)
MCV: 107.4 fL — ABNORMAL HIGH (ref 80.0–100.0)
Monocytes Absolute: 0.9 10*3/uL (ref 0.1–1.0)
Monocytes Relative: 11 %
Neutro Abs: 5.9 10*3/uL (ref 1.7–7.7)
Neutrophils Relative %: 73 %
Platelets: 130 10*3/uL — ABNORMAL LOW (ref 150–400)
RBC: 3.76 MIL/uL — ABNORMAL LOW (ref 3.87–5.11)
RDW: 13.6 % (ref 11.5–15.5)
WBC: 8.1 10*3/uL (ref 4.0–10.5)
nRBC: 0 % (ref 0.0–0.2)

## 2020-07-21 LAB — BASIC METABOLIC PANEL
Anion gap: 15 (ref 5–15)
BUN: 23 mg/dL (ref 8–23)
CO2: 24 mmol/L (ref 22–32)
Calcium: 8.9 mg/dL (ref 8.9–10.3)
Chloride: 105 mmol/L (ref 98–111)
Creatinine, Ser: 0.51 mg/dL (ref 0.44–1.00)
GFR, Estimated: >60 mL/min (ref >60)
Glucose, Bld: 97 mg/dL (ref 70–99)
Potassium: 3.6 mmol/L (ref 3.5–5.1)
Sodium: 144 mmol/L (ref 135–145)

## 2020-07-21 LAB — PHOSPHORUS: Phosphorus: 2.9 mg/dL (ref 2.5–4.6)

## 2020-07-21 LAB — MAGNESIUM: Magnesium: 2.3 mg/dL (ref 1.7–2.4)

## 2020-07-21 LAB — VITAMIN B12: Vitamin B-12: 410 pg/mL (ref 180–914)

## 2020-07-21 LAB — PREALBUMIN: Prealbumin: 11.6 mg/dL — ABNORMAL LOW (ref 18–38)

## 2020-07-21 MED ORDER — DIATRIZOATE MEGLUMINE & SODIUM 66-10 % PO SOLN
90.0000 mL | Freq: Once | ORAL | Status: DC
  Filled 2020-07-21: qty 90

## 2020-07-21 NOTE — Progress Notes (Signed)
PROGRESS NOTE    Theresa Arnold   YTK:[id number]  DOB: Sep 09, 1931  DOA: 07/19/2020     1  PCP: Biagio Borg, MD  CC: abd pain, N/V  Hospital Course: Theresa Arnold is an 87 yo female with PMH diverticulitis (s/p Hartmann procedure 03/22/19 with takedown on 09/04/19), HLD, COPD who presented to the hospital with abdominal pain and nausea/vomiting.  She had reported that she had not had any food for approximately 1 week prior to admission and was only drinking Gatorade and water.  She also reported that she had no flatus or bowel movements also within 1 week. NG tube attempt was made in the ER however unsuccessful and IR guided placement was ordered. General surgery was also consulted as CT abdomen/pelvis showed a small bowel obstruction in the right lower quadrant with transition point.   Interval History:  No events overnight. Resting in bed complaining of wanting more liquids to drink and irritation of NGT. Encouraged her to continue to tolerate as best she can. No vomiting and nausea seems controlled/resolved.    Old records reviewed in assessment of this patient  ROS: Constitutional: negative for chills and fevers, Respiratory: negative for cough, Cardiovascular: negative for chest pain and Gastrointestinal: positive for abdominal pain and nausea  Assessment & Plan: * SBO (small bowel obstruction) (HCC) - CT abd/pelvis on admission shows SBO with transition point in RLQ; etiology likely adhesions from prior abdominal surgeries (03/2019 and 08/2019 for Hartmann's and reversal) - s/p NGT placement with radiology on 1/8 -Keep n.p.o., some ice chips are okay -Continue fluids -Continue antiemetics and pain control - approaching probable need for TPN as she is reporting ~1 week since she ate (was only drinking gatorade/water); labs are not dramatically concerning yet; trend Mg, Phos and followup prealbumin) - from a surgical risk perspective she is low risk (per NSQIP and RCRI) for  withstanding stress of surgery. Would benefit from early PT as she is higher risk for needing rehab/SNF after surgery for recovery, but otherwise given 2 prior abdominal surgeries in 2020 and 2021, she has tolerated them well.   Macrocytosis without anemia - no anemia noted but has been in the past; may be hemoconcentrated if hasn't eaten in ~1 week  - trend CBC while on IVF - follow up B12, folate (both adequate)  Thrombocytopenia (HCC) -Appears to have a normal platelet count looking back several years.  Mild thrombocytopenia started around February 2021 at her last surgery -Platelet count on admission 136k - no bleeding - monitor for now; remaining stable ~130  Hypokalemia - replete and recheck as needed  Hiccups - supportive care for now  COPD (chronic obstructive pulmonary disease) ([place]) - no s/s exacerbation   Diverticulosis of colon - s/p hx of Hartmann's procedure for obstructed diverticulitis    Antimicrobials: N/A  DVT prophylaxis: Lovenox Code Status: Full Family Communication: None present Disposition Plan: Status is: Inpatient  Remains inpatient appropriate because:Ongoing diagnostic testing needed not appropriate for outpatient work up, Unsafe d/c plan, IV treatments appropriate due to intensity of illness or inability to take PO and Inpatient level of care appropriate due to severity of illness   Dispo: The patient is from: Home              Anticipated d/c is to: Home              Anticipated d/c date is: > 3 days              Patient  currently is not medically stable to d/c.   Objective: Blood pressure 133/73, pulse 86, temperature 97.6 F (36.4 C), temperature source Oral, resp. rate 16, height 5' (1.524 m), weight 61.2 kg, SpO2 95 %.  Examination: General appearance: alert, cooperative and no distress Head: Normocephalic, without obvious abnormality, atraumatic Eyes: EOMI Lungs: clear to auscultation bilaterally Heart: regular rate and rhythm and  S1, S2 normal Abdomen: Mildly distended, soft, mild nonspecific tenderness to palpation throughout. Hypoactive bowel sounds with high-pitched tinkling at times Extremities: No edema Skin: mobility and turgor normal Neurologic: Grossly normal  Consultants:   General surgery  Procedures:     Data Reviewed: I have personally reviewed following labs and imaging studies Results for orders placed or performed during the hospital encounter of 07/19/20 (from the past 24 hour(s))  Basic metabolic panel     Status: None   Collection Time: 07/21/20  5:50 AM  Result Value Ref Range   Sodium 144 135 - 145 mmol/L   Potassium 3.6 3.5 - 5.1 mmol/L   Chloride 105 98 - 111 mmol/L   CO2 24 22 - 32 mmol/L   Glucose, Bld 97 70 - 99 mg/dL   BUN 23 8 - 23 mg/dL   Creatinine, Ser 0.51 0.44 - 1.00 mg/dL   Calcium 8.9 8.9 - 10.3 mg/dL   GFR, Estimated >60 >60 mL/min   Anion gap 15 5 - 15  CBC with Differential/Platelet     Status: Abnormal   Collection Time: 07/21/20  5:50 AM  Result Value Ref Range   WBC 8.1 4.0 - 10.5 K/uL   RBC 3.76 (L) 3.87 - 5.11 MIL/uL   Hemoglobin 13.7 12.0 - 15.0 g/dL   HCT 40.4 36.0 - 46.0 %   MCV 107.4 (H) 80.0 - 100.0 fL   MCH 36.4 (H) 26.0 - 34.0 pg   MCHC 33.9 30.0 - 36.0 g/dL   RDW 13.6 11.5 - 15.5 %   Platelets 130 (L) 150 - 400 K/uL   nRBC 0.0 0.0 - 0.2 %   Neutrophils Relative % 73 %   Neutro Abs 5.9 1.7 - 7.7 K/uL   Lymphocytes Relative 16 %   Lymphs Abs 1.3 0.7 - 4.0 K/uL   Monocytes Relative 11 %   Monocytes Absolute 0.9 0.1 - 1.0 K/uL   Eosinophils Relative 0 %   Eosinophils Absolute 0.0 0.0 - 0.5 K/uL   Basophils Relative 0 %   Basophils Absolute 0.0 0.0 - 0.1 K/uL   Immature Granulocytes 0 %   Abs Immature Granulocytes 0.03 0.00 - 0.07 K/uL  Magnesium     Status: None   Collection Time: 07/21/20  5:50 AM  Result Value Ref Range   Magnesium 2.3 1.7 - 2.4 mg/dL  Phosphorus     Status: None   Collection Time: 07/21/20  5:50 AM  Result Value Ref  Range   Phosphorus 2.9 2.5 - 4.6 mg/dL  Vitamin B12     Status: None   Collection Time: 07/21/20  8:41 AM  Result Value Ref Range   Vitamin B-12 410 180 - 914 pg/mL  Folate, serum, performed at [facility] lab     Status: None   Collection Time: 07/21/20  8:41 AM  Result Value Ref Range   Folate 10.9 >5.9 ng/mL    Recent Results (from the past 240 hour(s))  Resp Panel by RT-PCR (Flu A&B, Covid) Nasopharyngeal Swab     Status: None   Collection Time: 07/17/20  9:18 AM   Specimen:  Nasopharyngeal Swab; Nasopharyngeal(NP) swabs in vial transport medium  Result Value Ref Range Status   SARS Coronavirus 2 by RT PCR NEGATIVE NEGATIVE Final    Comment: (NOTE) SARS-CoV-2 target nucleic acids are NOT DETECTED.  The SARS-CoV-2 RNA is generally detectable in upper respiratory specimens during the acute phase of infection. The lowest concentration of SARS-CoV-2 viral copies this assay can detect is 138 copies/mL. A negative result does not preclude SARS-Cov-2 infection and should not be used as the sole basis for treatment or other patient management decisions. A negative result may occur with  improper specimen collection/handling, submission of specimen other than nasopharyngeal swab, presence of viral mutation(s) within the areas targeted by this assay, and inadequate number of viral copies(<138 copies/mL). A negative result must be combined with clinical observations, patient history, and epidemiological information. The expected result is Negative.  Fact Sheet for Patients:  EntrepreneurPulse.com.au  Fact Sheet for Healthcare Providers:  IncredibleEmployment.be  This test is no t yet approved or cleared by the [place] FDA and  has been authorized for detection and/or diagnosis of SARS-CoV-2 by FDA under an Emergency Use Authorization (EUA). This EUA will remain  in effect (meaning this test can be used) for the duration of the COVID-19  declaration under Section 564(b)(1) of the Act, 21 U.S.C.section 360bbb-3(b)(1), unless the authorization is terminated  or revoked sooner.       Influenza A by PCR NEGATIVE NEGATIVE Final   Influenza B by PCR NEGATIVE NEGATIVE Final    Comment: (NOTE) The Xpert Xpress SARS-CoV-2/FLU/RSV plus assay is intended as an aid in the diagnosis of influenza from Nasopharyngeal swab specimens and should not be used as a sole basis for treatment. Nasal washings and aspirates are unacceptable for Xpert Xpress SARS-CoV-2/FLU/RSV testing.  Fact Sheet for Patients: EntrepreneurPulse.com.au  Fact Sheet for Healthcare Providers: IncredibleEmployment.be  This test is not yet approved or cleared by the [place] FDA and has been authorized for detection and/or diagnosis of SARS-CoV-2 by FDA under an Emergency Use Authorization (EUA). This EUA will remain in effect (meaning this test can be used) for the duration of the COVID-19 declaration under Section 564(b)(1) of the Act, 21 U.S.C. section 360bbb-3(b)(1), unless the authorization is terminated or revoked.  Performed at [facility] [address] [place], [place] [address]   Resp Panel by RT-PCR (Flu A&B, Covid) Nasopharyngeal Swab     Status: None   Collection Time: 07/20/20  3:05 AM   Specimen: Nasopharyngeal Swab; Nasopharyngeal(NP) swabs in vial transport medium  Result Value Ref Range Status   SARS Coronavirus 2 by RT PCR NEGATIVE NEGATIVE Final    Comment: (NOTE) SARS-CoV-2 target nucleic acids are NOT DETECTED.  The SARS-CoV-2 RNA is generally detectable in upper respiratory specimens during the acute phase of infection. The lowest concentration of SARS-CoV-2 viral copies this assay can detect is 138 copies/mL. A negative result does not preclude SARS-Cov-2 infection and should not be used as the sole basis for treatment or other patient management decisions. A negative  result may occur with  improper specimen collection/handling, submission of specimen other than nasopharyngeal swab, presence of viral mutation(s) within the areas targeted by this assay, and inadequate number of viral copies(<138 copies/mL). A negative result must be combined with clinical observations, patient history, and epidemiological information. The expected result is Negative.  Fact Sheet for Patients:  EntrepreneurPulse.com.au  Fact Sheet for Healthcare Providers:  IncredibleEmployment.be  This test is no t yet approved or cleared by the  [place] Architectural [employer] and  has been authorized for detection and/or diagnosis of SARS-CoV-2 by FDA under an Print production planner (EUA). This EUA will remain  in effect (meaning this test can be used) for the duration of the COVID-19 declaration under Section 564(b)(1) of the Act, 21 U.S.C.section 360bbb-3(b)(1), unless the authorization is terminated  or revoked sooner.       Influenza A by PCR NEGATIVE NEGATIVE Final   Influenza B by PCR NEGATIVE NEGATIVE Final    Comment: (NOTE) The Xpert Xpress SARS-CoV-2/FLU/RSV plus assay is intended as an aid in the diagnosis of influenza from Nasopharyngeal swab specimens and should not be used as a sole basis for treatment. Nasal washings and aspirates are unacceptable for Xpert Xpress SARS-CoV-2/FLU/RSV testing.  Fact Sheet for Patients: EntrepreneurPulse.com.au  Fact Sheet for Healthcare Providers: IncredibleEmployment.be  This test is not yet approved or cleared by the [place] FDA and has been authorized for detection and/or diagnosis of SARS-CoV-2 by FDA under an Emergency Use Authorization (EUA). This EUA will remain in effect (meaning this test can be used) for the duration of the COVID-19 declaration under Section 564(b)(1) of the Act, 21 U.S.C. section 360bbb-3(b)(1), unless the authorization is  terminated or revoked.  Performed at [facility], [place] [address] [place], [place] [address]      Radiology Studies: CT ABDOMEN PELVIS W CONTRAST  Result Date: 07/20/2020 CLINICAL DATA:  Initial evaluation for acute abdominal pain and distension. EXAM: CT ABDOMEN AND PELVIS WITH CONTRAST TECHNIQUE: Multidetector CT imaging of the abdomen and pelvis was performed using the standard protocol following bolus administration of intravenous contrast. CONTRAST:  157mL OMNIPAQUE IOHEXOL 300 MG/ML  SOLN COMPARISON:  Prior CT from 03/11/2019. FINDINGS: Lower chest: Trace layering bilateral pleural effusions with associated atelectasis. Scattered coronary artery calcifications noted. Visualized lungs are otherwise clear. Hepatobiliary: Few scattered small hepatic cysts noted, stable from previous. Liver otherwise unremarkable. Gallbladder within normal limits. No biliary dilatation. Pancreas: Pancreas within normal limits. Spleen: Spleen within normal limits. Adrenals/Urinary Tract: Adrenal glands are normal. Kidneys equal size with symmetric enhancement. Few scattered subcentimeter hypodense lesions noted within the kidneys, too small the characterize, but statistically likely reflects small cyst. No nephrolithiasis or hydronephrosis. No focal renal mass. No visible hydroureter. Partially distended bladder within normal limits. Stomach/Bowel: Mild circumferential wall thickening about the visualized esophagus. Stomach within normal limits. Multiple prominent loops of small bowel seen within the left and mid abdomen, measuring up to 3.9 cm in diameter. Associated internal air-fluid levels. Focal transition point within the right lower quadrant (series 5, image 60). Small bowel is decompressed distally. Finding consistent with a mechanical small bowel obstruction. Underlying adhesive disease is suspected. No pneumatosis or portal venous gas. Appendix is normal. Colon is diffusely decompressed.  Scattered colonic diverticulosis involving the right colon without diverticulitis. Sequelae of prior partial colectomy noted at the left lower quadrant. Vascular/Lymphatic: Moderate aorto bi-iliac atherosclerotic disease. No aneurysm. Mesenteric vessels grossly patent proximally. No adenopathy. Reproductive: Uterus within normal limits. Ovaries are atrophic in appearance bilaterally. Few small dystrophic calcifications noted involving the left ovary. No adnexal mass. Other: Trace free fluid seen adjacent to the dilated loops of bowel as well as adjacent to the spleen, likely reactive. No free air. Small fat containing paraumbilical hernia noted. Musculoskeletal: No acute osseous finding. No discrete or worrisome osseous lesions. IMPRESSION: 1. Findings consistent with a mechanical small bowel obstruction with transition point in the right lower quadrant. Underlying adhesive disease is suspected. 2. Trace layering  bilateral pleural effusions with associated atelectasis. 3. Colonic diverticulosis without diverticulitis. 4. Aortic Atherosclerosis (ICD10-I70.0). Electronically Signed   By: Jeannine Boga M.D.   On: 07/20/2020 02:47   DG Abd Portable 1V-Small Bowel Obstruction Protocol-initial, 8 hr delay  Result Date: 07/20/2020 CLINICAL DATA:  Small bowel obstruction.  8 hour delayed film. EXAM: PORTABLE ABDOMEN - 1 VIEW COMPARISON:  Abdominal radiograph and CT earlier today. FINDINGS: Tip and side port of the enteric tube below the diaphragm in the stomach. Persisting gaseous small bowel distension from earlier today. Contrast in the pelvis is likely excreted IV contrast in the urinary bladder from recent CT. There is no enteric contrast in the colon. No evidence of free air. IMPRESSION: 1. Persistent small bowel distension consistent with obstruction. There is no enteric contrast in the colon. 2. Contrast in the pelvis is likely excreted IV contrast in the urinary bladder from CT earlier today.  Electronically Signed   By: Keith Rake M.D.   On: 07/20/2020 22:39   DG Abd Portable 1V-Small Bowel Protocol-Position Verification  Result Date: 07/20/2020 CLINICAL DATA:  Status post NG tube placement. EXAM: PORTABLE ABDOMEN - 1 VIEW COMPARISON:  CT abdomen from earlier same day. FINDINGS: Nasogastric tube a is positioned within the stomach, with proximal side holes just below the level of the gastroesophageal junction. Distended gas-filled small bowel loops are seen throughout the abdomen and pelvis, compatible with obstruction. IMPRESSION: 1. Nasogastric tube is positioned within the stomach, but proximal side holes are located at or just below the level of the gastroesophageal junction. Consider advancing the nasogastric tube 5-10 cm for optimal radiographic positioning. 2. Small bowel obstruction. Electronically Signed   By: Franki Cabot M.D.   On: 07/20/2020 13:04   DG Loyce Dys Tube Plc W/Fl W/Rad  Result Date: 07/20/2020 CLINICAL DATA:  Nasogastric placement unsuccessful on the floor. Mechanical small bowel obstruction. EXAM: NASO G TUBE PLACEMENT WITH FL AND WITH RAD CONTRAST:  None FLUOROSCOPY TIME:  Fluoroscopy Time: 4 minutes 6 seconds. 903.15 micro gray meter squared COMPARISON:  None. FINDINGS: Nasogastric tube was placed through the left nostril, reaching the stomach without difficulty. I was unable to direct the nasogastric tube into the proximal small intestine and the tube was secured in place with its tip in the gastric fundus. IMPRESSION: Nasogastric tube placed through the left nostril with the tip in the gastric fundus. I was unable to direct the tube into the small intestine. Electronically Signed   By: Nelson Chimes M.D.   On: 07/20/2020 13:13   DG Abd Portable 1V-Small Bowel Obstruction Protocol-initial, 8 hr delay  Final Result    DG Abd Portable 1V-Small Bowel Protocol-Position Verification  Final Result    DG Naso G Tube Plc W/Fl W/Rad  Final Result    CT ABDOMEN  PELVIS W CONTRAST  Final Result    IR INTRO LONG GI TUBE    (Results Pending)  DG Abd Portable 1V-Small Bowel Protocol-Position Verification    (Results Pending)  DG Abd Portable 1V-Small Bowel Obstruction Protocol-initial, 8 hr delay    (Results Pending)    Scheduled Meds: . bisacodyl  10 mg Rectal Daily  . diatrizoate meglumine-sodium  90 mL Per NG tube Once  . enoxaparin (LOVENOX) injection  40 mg Subcutaneous Q24H  . lip balm  1 application Topical BID   PRN Meds: acetaminophen, alum & mag hydroxide-simeth, HYDROmorphone (DILAUDID) injection, lactated ringers, magic mouthwash, menthol-cetylpyridinium, methocarbamol (ROBAXIN) IV, ondansetron (ZOFRAN) IV **OR** ondansetron (ZOFRAN) IV, ondansetron **OR** [  DISCONTINUED] ondansetron (ZOFRAN) IV, phenol, prochlorperazine, simethicone Continuous Infusions: . lactated ringers    . lactated ringers 100 mL/hr at 07/20/20 1941  . methocarbamol (ROBAXIN) IV    . ondansetron (ZOFRAN) IV       LOS: 1 day  Time spent: Greater than 50% of the 35 minute visit was spent in counseling/coordination of care for the patient as laid out in the A&P.   Dwyane Dee, MD [facility] 07/21/2020, 1:41 PM

## 2020-07-21 NOTE — Progress Notes (Addendum)
Subjective/Chief Complaint: No bowel movement.  NG tube in place and functioning well.   Objective: Vital signs in last 24 hours: Temp:  [97.5 F (36.4 C)-98.8 F (37.1 C)] 97.6 F (36.4 C) (01/09 1002) Pulse Rate:  [71-87] 86 (01/09 1002) Resp:  [16-18] 16 (01/09 1002) BP: (131-155)/(59-133) 133/73 (01/09 1002) SpO2:  [94 %-100 %] 95 % (01/09 1002) Last BM Date: 07/13/20  Intake/Output from previous day: 01/08 0701 - 01/09 0700 In: 2042 [I.V.:2042] Out: 800 [Emesis/NG output:800] Intake/Output this shift: Total I/O In: 30 [P.O.:30] Out: 50 [Emesis/NG output:50]  General appearance: alert and cooperative Resp: clear to auscultation bilaterally GI: Distended.  No rebound or guarding.  Lab Results:  Recent Labs    07/20/20 0611 07/21/20 0550  WBC 7.2 8.1  HGB 14.6 13.7  HCT 43.8 40.4  PLT 130* 130*   BMET Recent Labs    07/20/20 0611 07/21/20 0550  NA 139 144  K 3.6 3.6  CL 99 105  CO2 26 24  GLUCOSE 112* 97  BUN 28* 23  CREATININE 0.59 0.51  CALCIUM 9.7 8.9   PT/INR No results for input(s): LABPROT, INR in the last 72 hours. ABG No results for input(s): PHART, HCO3 in the last 72 hours.  Invalid input(s): PCO2, PO2  Studies/Results: CT ABDOMEN PELVIS W CONTRAST  Result Date: 07/20/2020 CLINICAL DATA:  Initial evaluation for acute abdominal pain and distension. EXAM: CT ABDOMEN AND PELVIS WITH CONTRAST TECHNIQUE: Multidetector CT imaging of the abdomen and pelvis was performed using the standard protocol following bolus administration of intravenous contrast. CONTRAST:  137mL OMNIPAQUE IOHEXOL 300 MG/ML  SOLN COMPARISON:  Prior CT from 03/11/2019. FINDINGS: Lower chest: Trace layering bilateral pleural effusions with associated atelectasis. Scattered coronary artery calcifications noted. Visualized lungs are otherwise clear. Hepatobiliary: Few scattered small hepatic cysts noted, stable from previous. Liver otherwise unremarkable. Gallbladder within  normal limits. No biliary dilatation. Pancreas: Pancreas within normal limits. Spleen: Spleen within normal limits. Adrenals/Urinary Tract: Adrenal glands are normal. Kidneys equal size with symmetric enhancement. Few scattered subcentimeter hypodense lesions noted within the kidneys, too small the characterize, but statistically likely reflects small cyst. No nephrolithiasis or hydronephrosis. No focal renal mass. No visible hydroureter. Partially distended bladder within normal limits. Stomach/Bowel: Mild circumferential wall thickening about the visualized esophagus. Stomach within normal limits. Multiple prominent loops of small bowel seen within the left and mid abdomen, measuring up to 3.9 cm in diameter. Associated internal air-fluid levels. Focal transition point within the right lower quadrant (series 5, image 60). Small bowel is decompressed distally. Finding consistent with a mechanical small bowel obstruction. Underlying adhesive disease is suspected. No pneumatosis or portal venous gas. Appendix is normal. Colon is diffusely decompressed. Scattered colonic diverticulosis involving the right colon without diverticulitis. Sequelae of prior partial colectomy noted at the left lower quadrant. Vascular/Lymphatic: Moderate aorto bi-iliac atherosclerotic disease. No aneurysm. Mesenteric vessels grossly patent proximally. No adenopathy. Reproductive: Uterus within normal limits. Ovaries are atrophic in appearance bilaterally. Few small dystrophic calcifications noted involving the left ovary. No adnexal mass. Other: Trace free fluid seen adjacent to the dilated loops of bowel as well as adjacent to the spleen, likely reactive. No free air. Small fat containing paraumbilical hernia noted. Musculoskeletal: No acute osseous finding. No discrete or worrisome osseous lesions. IMPRESSION: 1. Findings consistent with a mechanical small bowel obstruction with transition point in the right lower quadrant. Underlying  adhesive disease is suspected. 2. Trace layering bilateral pleural effusions with associated atelectasis. 3. Colonic diverticulosis  without diverticulitis. 4. Aortic Atherosclerosis (ICD10-I70.0). Electronically Signed   By: Jeannine Boga M.D.   On: 07/20/2020 02:47   DG Abd Portable 1V-Small Bowel Obstruction Protocol-initial, 8 hr delay  Result Date: 07/20/2020 CLINICAL DATA:  Small bowel obstruction.  8 hour delayed film. EXAM: PORTABLE ABDOMEN - 1 VIEW COMPARISON:  Abdominal radiograph and CT earlier today. FINDINGS: Tip and side port of the enteric tube below the diaphragm in the stomach. Persisting gaseous small bowel distension from earlier today. Contrast in the pelvis is likely excreted IV contrast in the urinary bladder from recent CT. There is no enteric contrast in the colon. No evidence of free air. IMPRESSION: 1. Persistent small bowel distension consistent with obstruction. There is no enteric contrast in the colon. 2. Contrast in the pelvis is likely excreted IV contrast in the urinary bladder from CT earlier today. Electronically Signed   By: Keith Rake M.D.   On: 07/20/2020 22:39   DG Abd Portable 1V-Small Bowel Protocol-Position Verification  Result Date: 07/20/2020 CLINICAL DATA:  Status post NG tube placement. EXAM: PORTABLE ABDOMEN - 1 VIEW COMPARISON:  CT abdomen from earlier same day. FINDINGS: Nasogastric tube a is positioned within the stomach, with proximal side holes just below the level of the gastroesophageal junction. Distended gas-filled small bowel loops are seen throughout the abdomen and pelvis, compatible with obstruction. IMPRESSION: 1. Nasogastric tube is positioned within the stomach, but proximal side holes are located at or just below the level of the gastroesophageal junction. Consider advancing the nasogastric tube 5-10 cm for optimal radiographic positioning. 2. Small bowel obstruction. Electronically Signed   By: Franki Cabot M.D.   On: 07/20/2020  13:04   DG Loyce Dys Tube Plc W/Fl W/Rad  Result Date: 07/20/2020 CLINICAL DATA:  Nasogastric placement unsuccessful on the floor. Mechanical small bowel obstruction. EXAM: NASO G TUBE PLACEMENT WITH FL AND WITH RAD CONTRAST:  None FLUOROSCOPY TIME:  Fluoroscopy Time: 4 minutes 6 seconds. 903.15 micro gray meter squared COMPARISON:  None. FINDINGS: Nasogastric tube was placed through the left nostril, reaching the stomach without difficulty. I was unable to direct the nasogastric tube into the proximal small intestine and the tube was secured in place with its tip in the gastric fundus. IMPRESSION: Nasogastric tube placed through the left nostril with the tip in the gastric fundus. I was unable to direct the tube into the small intestine. Electronically Signed   By: Nelson Chimes M.D.   On: 07/20/2020 13:13    Anti-infectives: Anti-infectives (From admission, onward)   None      Assessment/Plan: Small bowel obstruction-continue NG tube, IV fluids and initiate small bowel protocol.  No signs of progression nor increasing abdominal pain to indicate ischemia.   LOS: 1 day    Turner Daniels MD 07/21/2020

## 2020-07-22 ENCOUNTER — Inpatient Hospital Stay: Payer: Self-pay

## 2020-07-22 ENCOUNTER — Encounter (HOSPITAL_COMMUNITY)
Admission: EM | Disposition: A | Discharge status: Home / Self Care | Payer: Self-pay | Source: Home / Self Care | Attending: Internal Medicine

## 2020-07-22 ENCOUNTER — Inpatient Hospital Stay (HOSPITAL_COMMUNITY): Payer: Medicare Other

## 2020-07-22 ENCOUNTER — Inpatient Hospital Stay (HOSPITAL_COMMUNITY): Payer: Medicare Other | Admitting: Certified Registered"

## 2020-07-22 DIAGNOSIS — K56609 Unspecified intestinal obstruction, unspecified as to partial versus complete obstruction: Secondary | ICD-10-CM | POA: Diagnosis not present

## 2020-07-22 HISTORY — PX: LAPAROTOMY: SHX154

## 2020-07-22 LAB — PHOSPHORUS: Phosphorus: 2.6 mg/dL (ref 2.5–4.6)

## 2020-07-22 LAB — CBC WITH DIFFERENTIAL/PLATELET
Abs Immature Granulocytes: 0.03 10*3/uL (ref 0.00–0.07)
Basophils Absolute: 0 10*3/uL (ref 0.0–0.1)
Basophils Relative: 0 %
Eosinophils Absolute: 0 10*3/uL (ref 0.0–0.5)
Eosinophils Relative: 0 %
HCT: 43.1 % (ref 36.0–46.0)
Hemoglobin: 14.1 g/dL (ref 12.0–15.0)
Immature Granulocytes: 1 %
Lymphocytes Relative: 16 %
Lymphs Abs: 1.1 10*3/uL (ref 0.7–4.0)
MCH: 36.3 pg — ABNORMAL HIGH (ref 26.0–34.0)
MCHC: 32.7 g/dL (ref 30.0–36.0)
MCV: 111.1 fL — ABNORMAL HIGH (ref 80.0–100.0)
Monocytes Absolute: 0.9 10*3/uL (ref 0.1–1.0)
Monocytes Relative: 14 %
Neutro Abs: 4.6 10*3/uL (ref 1.7–7.7)
Neutrophils Relative %: 69 %
Platelets: 147 10*3/uL — ABNORMAL LOW (ref 150–400)
RBC: 3.88 MIL/uL (ref 3.87–5.11)
RDW: 13.7 % (ref 11.5–15.5)
WBC: 6.6 10*3/uL (ref 4.0–10.5)
nRBC: 0 % (ref 0.0–0.2)

## 2020-07-22 LAB — MAGNESIUM: Magnesium: 2 mg/dL (ref 1.7–2.4)

## 2020-07-22 LAB — BASIC METABOLIC PANEL
Anion gap: 16 — ABNORMAL HIGH (ref 5–15)
BUN: 22 mg/dL (ref 8–23)
CO2: 23 mmol/L (ref 22–32)
Calcium: 8.6 mg/dL — ABNORMAL LOW (ref 8.9–10.3)
Chloride: 103 mmol/L (ref 98–111)
Creatinine, Ser: 0.52 mg/dL (ref 0.44–1.00)
GFR, Estimated: >60 mL/min (ref >60)
Glucose, Bld: 105 mg/dL — ABNORMAL HIGH (ref 70–99)
Potassium: 3.1 mmol/L — ABNORMAL LOW (ref 3.5–5.1)
Sodium: 142 mmol/L (ref 135–145)

## 2020-07-22 LAB — SURGICAL PCR SCREEN
MRSA, PCR: NEGATIVE
Staphylococcus aureus: NEGATIVE

## 2020-07-22 SURGERY — LAPAROTOMY, EXPLORATORY
Anesthesia: General | Site: Abdomen

## 2020-07-22 MED ORDER — ACETAMINOPHEN 10 MG/ML IV SOLN
1000.0000 mg | Freq: Four times a day (QID) | INTRAVENOUS | Status: AC
  Administered 2020-07-22 – 2020-07-23 (×4): 1000 mg via INTRAVENOUS
  Filled 2020-07-22 (×4): qty 100

## 2020-07-22 MED ORDER — PHENYLEPHRINE 40 MCG/ML (10ML) SYRINGE FOR IV PUSH (FOR BLOOD PRESSURE SUPPORT)
PREFILLED_SYRINGE | INTRAVENOUS | Status: DC | PRN
  Administered 2020-07-22 (×5): 40 ug via INTRAVENOUS

## 2020-07-22 MED ORDER — CEFAZOLIN SODIUM-DEXTROSE 2-4 GM/100ML-% IV SOLN
2.0000 g | INTRAVENOUS | Status: AC
  Administered 2020-07-22: 2 g via INTRAVENOUS
  Filled 2020-07-22 (×2): qty 100

## 2020-07-22 MED ORDER — INSULIN ASPART 100 UNIT/ML ~~LOC~~ SOLN
0.0000 [IU] | Freq: Four times a day (QID) | SUBCUTANEOUS | Status: DC
  Administered 2020-07-24 – 2020-07-27 (×7): 1 [IU] via SUBCUTANEOUS

## 2020-07-22 MED ORDER — 0.9 % SODIUM CHLORIDE (POUR BTL) OPTIME
TOPICAL | Status: DC | PRN
  Administered 2020-07-22: 2000 mL

## 2020-07-22 MED ORDER — HYDROMORPHONE HCL 1 MG/ML IJ SOLN
0.2500 mg | INTRAMUSCULAR | Status: DC | PRN
  Administered 2020-07-22 (×4): 0.25 mg via INTRAVENOUS
  Administered 2020-07-22 (×2): 0.5 mg via INTRAVENOUS

## 2020-07-22 MED ORDER — LACTATED RINGERS IV SOLN: INTRAVENOUS | Status: DC

## 2020-07-22 MED ORDER — HYDROMORPHONE HCL 1 MG/ML IJ SOLN
INTRAMUSCULAR | Status: AC
  Filled 2020-07-22: qty 1

## 2020-07-22 MED ORDER — HYDROMORPHONE HCL 1 MG/ML IJ SOLN
0.5000 mg | INTRAMUSCULAR | Status: DC | PRN
  Administered 2020-07-22: 16:00:00 1 mg via INTRAVENOUS
  Filled 2020-07-22: qty 1

## 2020-07-22 MED ORDER — ROCURONIUM BROMIDE 10 MG/ML (PF) SYRINGE
PREFILLED_SYRINGE | INTRAVENOUS | Status: AC
  Filled 2020-07-22: qty 10

## 2020-07-22 MED ORDER — ONDANSETRON HCL 4 MG/2ML IJ SOLN: 4.0000 mg | Freq: Once | INTRAMUSCULAR | Status: DC | PRN

## 2020-07-22 MED ORDER — ROCURONIUM BROMIDE 10 MG/ML (PF) SYRINGE
PREFILLED_SYRINGE | INTRAVENOUS | Status: DC | PRN
  Administered 2020-07-22: 40 mg via INTRAVENOUS

## 2020-07-22 MED ORDER — POTASSIUM CHLORIDE 10 MEQ/100ML IV SOLN
10.0000 meq | INTRAVENOUS | Status: AC
  Administered 2020-07-22: 10 meq via INTRAVENOUS
  Filled 2020-07-22: qty 100

## 2020-07-22 MED ORDER — HYDROMORPHONE HCL 1 MG/ML IJ SOLN
0.5000 mg | INTRAMUSCULAR | Status: DC | PRN
  Administered 2020-07-22 – 2020-08-02 (×36): 1 mg via INTRAVENOUS
  Filled 2020-07-22 (×36): qty 1

## 2020-07-22 MED ORDER — ONDANSETRON HCL 4 MG/2ML IJ SOLN
INTRAMUSCULAR | Status: DC | PRN
  Administered 2020-07-22: 4 mg via INTRAVENOUS

## 2020-07-22 MED ORDER — LIDOCAINE 2% (20 MG/ML) 5 ML SYRINGE
INTRAMUSCULAR | Status: DC | PRN
  Administered 2020-07-22: 60 mg via INTRAVENOUS

## 2020-07-22 MED ORDER — ACETAMINOPHEN 10 MG/ML IV SOLN
1000.0000 mg | Freq: Once | INTRAVENOUS | Status: DC | PRN
  Administered 2020-07-22: 1000 mg via INTRAVENOUS

## 2020-07-22 MED ORDER — FENTANYL CITRATE (PF) 100 MCG/2ML IJ SOLN
INTRAMUSCULAR | Status: AC
  Filled 2020-07-22: qty 2

## 2020-07-22 MED ORDER — DEXAMETHASONE SODIUM PHOSPHATE 10 MG/ML IJ SOLN
INTRAMUSCULAR | Status: AC
  Filled 2020-07-22: qty 1

## 2020-07-22 MED ORDER — PROPOFOL 10 MG/ML IV BOLUS
INTRAVENOUS | Status: DC | PRN
  Administered 2020-07-22: 50 mg via INTRAVENOUS

## 2020-07-22 MED ORDER — ACETAMINOPHEN 10 MG/ML IV SOLN
INTRAVENOUS | Status: AC
  Filled 2020-07-22: qty 100

## 2020-07-22 MED ORDER — FENTANYL CITRATE (PF) 250 MCG/5ML IJ SOLN
INTRAMUSCULAR | Status: DC | PRN
  Administered 2020-07-22 (×2): 25 ug via INTRAVENOUS
  Administered 2020-07-22: 50 ug via INTRAVENOUS

## 2020-07-22 MED ORDER — SUCCINYLCHOLINE CHLORIDE 200 MG/10ML IV SOSY
PREFILLED_SYRINGE | INTRAVENOUS | Status: AC
  Filled 2020-07-22: qty 10

## 2020-07-22 MED ORDER — PROPOFOL 10 MG/ML IV BOLUS
INTRAVENOUS | Status: AC
  Filled 2020-07-22: qty 20

## 2020-07-22 MED ORDER — CHLORHEXIDINE GLUCONATE CLOTH 2 % EX PADS
6.0000 | MEDICATED_PAD | Freq: Every day | CUTANEOUS | Status: DC
  Administered 2020-07-22 – 2020-08-03 (×13): 6 via TOPICAL

## 2020-07-22 MED ORDER — TRACE MINERALS CU-MN-SE-ZN 300-55-60-3000 MCG/ML IV SOLN
INTRAVENOUS | Status: AC
  Filled 2020-07-22: qty 240

## 2020-07-22 MED ORDER — PHENYLEPHRINE 40 MCG/ML (10ML) SYRINGE FOR IV PUSH (FOR BLOOD PRESSURE SUPPORT)
PREFILLED_SYRINGE | INTRAVENOUS | Status: AC
  Filled 2020-07-22: qty 10

## 2020-07-22 MED ORDER — ONDANSETRON HCL 4 MG/2ML IJ SOLN
INTRAMUSCULAR | Status: AC
  Filled 2020-07-22: qty 2

## 2020-07-22 MED ORDER — SUCCINYLCHOLINE CHLORIDE 20 MG/ML IJ SOLN
INTRAMUSCULAR | Status: DC | PRN
  Administered 2020-07-22: 100 mg via INTRAVENOUS

## 2020-07-22 MED ORDER — SUGAMMADEX SODIUM 200 MG/2ML IV SOLN
INTRAVENOUS | Status: DC | PRN
  Administered 2020-07-22: 120 mg via INTRAVENOUS

## 2020-07-22 MED ORDER — DEXAMETHASONE SODIUM PHOSPHATE 10 MG/ML IJ SOLN
INTRAMUSCULAR | Status: DC | PRN
  Administered 2020-07-22: 4 mg via INTRAVENOUS

## 2020-07-22 MED ORDER — ENOXAPARIN SODIUM 40 MG/0.4ML ~~LOC~~ SOLN
40.0000 mg | SUBCUTANEOUS | Status: DC
  Administered 2020-07-23 – 2020-07-27 (×5): 40 mg via SUBCUTANEOUS
  Filled 2020-07-22 (×5): qty 0.4

## 2020-07-22 SURGICAL SUPPLY — 20 items
CHLORAPREP W/TINT 26 (MISCELLANEOUS) ×2 IMPLANT
COVER WAND RF STERILE (DRAPES) IMPLANT
DRAPE LAPAROSCOPIC ABDOMINAL (DRAPES) ×2 IMPLANT
DRSG OPSITE POSTOP 4X10 (GAUZE/BANDAGES/DRESSINGS) ×2 IMPLANT
ELECT REM PT RETURN 15FT ADLT (MISCELLANEOUS) ×2 IMPLANT
GLOVE BIO SURGEON STRL SZ7.5 (GLOVE) ×2 IMPLANT
GLOVE SURG UNDER POLY LF SZ7 (GLOVE) ×2 IMPLANT
GOWN STRL REUS W/TWL LRG LVL3 (GOWN DISPOSABLE) ×2 IMPLANT
GOWN STRL REUS W/TWL XL LVL3 (GOWN DISPOSABLE) ×4 IMPLANT
KIT BASIN OR (CUSTOM PROCEDURE TRAY) ×2 IMPLANT
KIT TURNOVER KIT A (KITS) IMPLANT
NS IRRIG 1000ML POUR BTL (IV SOLUTION) ×2 IMPLANT
PACK GENERAL/GYN (CUSTOM PROCEDURE TRAY) ×2 IMPLANT
PENCIL SMOKE EVACUATOR (MISCELLANEOUS) IMPLANT
SHEARS HARMONIC ACE PLUS 36CM (ENDOMECHANICALS) IMPLANT
STAPLER VISISTAT 35W (STAPLE) ×2 IMPLANT
SUT PDS AB 1 TP1 96 (SUTURE) ×4 IMPLANT
SUT SILK 3 0 SH CR/8 (SUTURE) ×4 IMPLANT
TOWEL OR 17X26 10 PK STRL BLUE (TOWEL DISPOSABLE) ×2 IMPLANT
TRAY FOLEY MTR SLVR 16FR STAT (SET/KITS/TRAYS/PACK) ×2 IMPLANT

## 2020-07-22 NOTE — Progress Notes (Signed)
Received order for PICC.  Patient taken to OR  Will follow up when back from surgery.

## 2020-07-22 NOTE — Progress Notes (Signed)
Progress Note     Subjective: Patient reports abdominal pain and distension. Pain generalized. She did have a small BM yesterday but no flatus or other BMs. Reports nausea. Feels like she is not getting better.   Objective: Vital signs in last 24 hours: Temp:  [97.6 F (36.4 C)-98.6 F (37 C)] 97.6 F (36.4 C) (01/10 0641) Pulse Rate:  [77-91] 91 (01/10 0641) Resp:  [14-16] 16 (01/10 0641) BP: (133-156)/(65-99) 156/99 (01/10 0641) SpO2:  [95 %-98 %] 95 % (01/10 0641) Last BM Date: 07/21/20  Intake/Output from previous day: 01/09 0701 - 01/10 0700 In: 2420 [P.O.:40; I.V.:2380] Out: 351 [Urine:1; Emesis/NG output:350] Intake/Output this shift: No intake/output data recorded.  PE: General: pleasant, WD, elderly female who is laying in bed and appears uncomfortable Heart: regular, rate, and rhythm.  Lungs: CTAB, no wheezes, rhonchi, or rales noted.  Respiratory effort nonlabored Abd: soft, generalized ttp, distended, no BS, NGT with bilious drainage    Lab Results:  Recent Labs    07/21/20 0550 07/22/20 0703  WBC 8.1 6.6  HGB 13.7 14.1  HCT 40.4 43.1  PLT 130* 147*   BMET Recent Labs    07/21/20 0550 07/22/20 0703  NA 144 142  K 3.6 3.1*  CL 105 103  CO2 24 23  GLUCOSE 97 105*  BUN 23 22  CREATININE 0.51 0.52  CALCIUM 8.9 8.6*   PT/INR No results for input(s): LABPROT, INR in the last 72 hours. CMP     Component Value Date/Time   NA 142 07/22/2020 0703   K 3.1 (L) 07/22/2020 0703   CL 103 07/22/2020 0703   CO2 23 07/22/2020 0703   GLUCOSE 105 (H) 07/22/2020 0703   BUN 22 07/22/2020 0703   CREATININE 0.52 07/22/2020 0703   CALCIUM 8.6 (L) 07/22/2020 0703   PROT 8.2 (H) 07/19/2020 1535   ALBUMIN 4.2 07/19/2020 1535   AST 61 (H) 07/19/2020 1535   ALT 28 07/19/2020 1535   ALKPHOS 56 07/19/2020 1535   BILITOT 1.6 (H) 07/19/2020 1535   GFRNONAA >60 07/22/2020 0703   GFRAA >60 09/09/2019 0353   Lipase     Component Value Date/Time   LIPASE 22  07/17/2020 0953       Studies/Results: DG Abd Portable 1V  Result Date: 07/22/2020 CLINICAL DATA:  Small-bowel obstruction EXAM: PORTABLE ABDOMEN - 1 VIEW COMPARISON:  Portable exam 0752 hours compared to 07/20/2020 FINDINGS: Persistent significant diffuse small bowel dilatation. No bowel wall thickening. Paucity of colonic gas. Findings consistent with small-bowel obstruction. Nasogastric tube coiled in stomach. Small amount of excreted contrast in bladder. Bones demineralized. IMPRESSION: Persistent small bowel obstruction. Electronically Signed   By: Lavonia Dana M.D.   On: 07/22/2020 08:27   DG Abd Portable 1V-Small Bowel Obstruction Protocol-initial, 8 hr delay  Result Date: 07/20/2020 CLINICAL DATA:  Small bowel obstruction.  8 hour delayed film. EXAM: PORTABLE ABDOMEN - 1 VIEW COMPARISON:  Abdominal radiograph and CT earlier today. FINDINGS: Tip and side port of the enteric tube below the diaphragm in the stomach. Persisting gaseous small bowel distension from earlier today. Contrast in the pelvis is likely excreted IV contrast in the urinary bladder from recent CT. There is no enteric contrast in the colon. No evidence of free air. IMPRESSION: 1. Persistent small bowel distension consistent with obstruction. There is no enteric contrast in the colon. 2. Contrast in the pelvis is likely excreted IV contrast in the urinary bladder from CT earlier today. Electronically Signed   By:  Keith Rake M.D.   On: 07/20/2020 22:39   DG Abd Portable 1V-Small Bowel Protocol-Position Verification  Result Date: 07/20/2020 CLINICAL DATA:  Status post NG tube placement. EXAM: PORTABLE ABDOMEN - 1 VIEW COMPARISON:  CT abdomen from earlier same day. FINDINGS: Nasogastric tube a is positioned within the stomach, with proximal side holes just below the level of the gastroesophageal junction. Distended gas-filled small bowel loops are seen throughout the abdomen and pelvis, compatible with obstruction.  IMPRESSION: 1. Nasogastric tube is positioned within the stomach, but proximal side holes are located at or just below the level of the gastroesophageal junction. Consider advancing the nasogastric tube 5-10 cm for optimal radiographic positioning. 2. Small bowel obstruction. Electronically Signed   By: Franki Cabot M.D.   On: 07/20/2020 13:04   DG Loyce Dys Tube Plc W/Fl W/Rad  Result Date: 07/20/2020 CLINICAL DATA:  Nasogastric placement unsuccessful on the floor. Mechanical small bowel obstruction. EXAM: NASO G TUBE PLACEMENT WITH FL AND WITH RAD CONTRAST:  None FLUOROSCOPY TIME:  Fluoroscopy Time: 4 minutes 6 seconds. 903.15 micro gray meter squared COMPARISON:  None. FINDINGS: Nasogastric tube was placed through the left nostril, reaching the stomach without difficulty. I was unable to direct the nasogastric tube into the proximal small intestine and the tube was secured in place with its tip in the gastric fundus. IMPRESSION: Nasogastric tube placed through the left nostril with the tip in the gastric fundus. I was unable to direct the tube into the small intestine. Electronically Signed   By: Nelson Chimes M.D.   On: 07/20/2020 13:13    Anti-infectives: Anti-infectives (From admission, onward)   None       Assessment/Plan COPD HLD  SBO - hx of Hartmann's 03/22/19 and takedown 09/04/19 - Not improving on SBO protocol - to OR today for exploratory laparotomy   FEN: NPO, IVF, NGT to LIWS VTE: lovenox ID: will write for ancef pre-op   LOS: 2 days    Norm Parcel , [facility] 07/22/2020, 9:02 AM Please see Amion for pager number during day hours 7:00am-4:30pm

## 2020-07-22 NOTE — Progress Notes (Signed)
Initial Nutrition Assessment  INTERVENTION:   Monitor magnesium, potassium, and phosphorus daily for at least 3 days, MD to replete as needed, as pt is at risk for refeeding syndrome.  -TPN management per Pharmacy -Will monitor for diet advancement  NUTRITION DIAGNOSIS:   Inadequate oral intake related to nausea,vomiting (SBO) as evidenced by per patient/family report,NPO status.  GOAL:   Patient will meet greater than or equal to 90% of their needs  MONITOR:   Labs,Weight trends,I & O's,Diet advancement (TPN)  REASON FOR ASSESSMENT:   Consult Assessment of nutrition requirement/status,New TPN/TNA  ASSESSMENT:   87 yo female with PMH diverticulitis (s/p Hartmann procedure 03/22/19 with takedown on 09/04/19), HLD, COPD who presented to the hospital with abdominal pain and nausea/vomiting.  She had reported that she had not had any food for approximately 1 week prior to admission and was only drinking Gatorade and water.  She also reported that she had no flatus or bowel movements also within 1 week.  NG tube attempt was made in the ER however unsuccessful and IR guided placement was ordered.  General surgery was also consulted as CT abdomen/pelvis showed a small bowel obstruction in the right lower quadrant with transition point.  Patient being taken down to OR at time of visit. Pt having ex lap today. NPO. Pt on SBO protocol.   Per chart review, pt has been unable to tolerate any PO for about a week d/t N/V. Pt has been subsisting on Gatorade and water mainly  NGT placed to LIS.  TPN to be initiated today. Per Pharmacy note, to start at 30 ml/hr following PICC placement.  Per weight records, no significant weight changes noted.  Medications: Dulcolax suppository, Lactated ringers, IV Zofran, KCl  Labs reviewed: Low K  NUTRITION - FOCUSED PHYSICAL EXAM:  In OR, will attempt at follow-up  Diet Order:   Diet Order            Diet NPO time specified  Diet effective now                  EDUCATION NEEDS:   No education needs have been identified at this time  Skin:  Skin Assessment: Reviewed RN Assessment  Last BM:  1/9 -type 1  Height:   Ht Readings from Last 1 Encounters:  07/19/20 5' (1.524 m)    Weight:   Wt Readings from Last 1 Encounters:  07/22/20 55.9 kg   BMI:  Body mass index is 24.07 kg/m.  Estimated Nutritional Needs:   Kcal:  1600-1800  Protein:  70-85g  Fluid:  1.8L/day  Clayton Bibles, MS, RD, LDN Inpatient Clinical Dietitian Contact information available via Amion

## 2020-07-22 NOTE — Anesthesia Procedure Notes (Signed)
Procedure Name: Intubation Date/Time: 07/22/2020 12:02 PM Performed by: Eben Burow, CRNA Pre-anesthesia Checklist: Patient identified, Emergency Drugs available, Suction available, Patient being monitored and Timeout performed Patient Re-evaluated:Patient Re-evaluated prior to induction Oxygen Delivery Method: Circle system utilized Preoxygenation: Pre-oxygenation with 100% oxygen Induction Type: IV induction, Rapid sequence and Cricoid Pressure applied Laryngoscope Size: Mac and 4 Grade View: Grade II Tube type: Oral Tube size: 7.0 mm Number of attempts: 1 Airway Equipment and Method: Stylet Placement Confirmation: ETT inserted through vocal cords under direct vision,  positive ETCO2 and breath sounds checked- equal and bilateral Secured at: 21 cm Tube secured with: Tape Dental Injury: Teeth and Oropharynx as per pre-operative assessment

## 2020-07-22 NOTE — Progress Notes (Signed)
PHARMACY - TOTAL PARENTERAL NUTRITION CONSULT NOTE   Indication: Bowel obstruction  Patient Measurements: Height: 5' (152.4 cm) Weight: 61.2 kg (135 lb) IBW/kg (Calculated) : 45.5 TPN AdjBW (KG): 49.4 Body mass index is 26.37 kg/m. Usual Weight:   Assessment:  [name] is an 87 yo female with PMH diverticulitis (s/p Hartmann procedure 03/22/19 with takedown on 09/04/19), HLD, COPD who presented to the hospital with abdominal pain and nausea/vomiting.  She had reported that she had not had any food for approximately 1 week prior to admission and was only drinking Gatorade and water. She also reported that she had no flatus or bowel movements also within 1 week.  Glucose / Insulin: at goal (CBG < 150)  Electrolytes: K+ 3.1 low, Ca is low at 8.6, phos slightly elevated at 16, others WNL  Renal: WNL  LFTs / TGs: WNL/pending  Prealbumin / albumin: 11.6 low, / albumin 4.2 WNL   Intake / Output; MIVF: + 2069, on LR 100 ml/hr  GI Imaging: - 1/8 CT of abdomen shows mechanical small bowel obstruction with transition point in the right lower quadrant. Underlying adhesive disease is suspected. - 1/10 persistent SBO   Surgeries / Procedures:  - 1/10 to OR  for exploratory laparotomy   Central access: will obtain PICC after procedure on 1/10  TPN start date:  1/10   Nutritional Goals: pending   (per RD recommendation on ---): kCal: ---, Protein: ---, Fluid: --- Goal TPN rate is --- mL/hr (provides -- g of protein and --- kcals per day)  - Today KCl 10 meq IV x 4   Current Nutrition:  NPO  Plan:   Start TPN at 78mL/hr at 1800  Electrolytes in TPN: 30mEq/L of Na, 50mEq/L of K, 68mEq/L of Ca, 34mEq/L of Mg, and 25mmol/L of Phos. Cl:Ac ratio 1:1  Add standard MVI and trace elements to TPN  Initiate very Sensitive q6h SSI and adjust as needed   Reduce MIVF to 70 mL/hr at 1800  BMP, magnesium, phosphorus with AM labs   Monitor TPN labs on Mon/Thurs    Royetta Asal,  PharmD, BCPS 07/22/2020 10:16 AM

## 2020-07-22 NOTE — Op Note (Signed)
EXPLORATORY LAPAROTOMY lysis of adhesions  Procedure Note  Theresa Arnold [birth year] - 07/22/2020   Pre-op Diagnosis: small bowel obstruction     Post-op Diagnosis: same  Procedure(s): EXPLORATORY LAPAROTOMY LYSIS OF ADHESIONS  Surgeon(s): Coralie Keens, MD  Anesthesia: General  Staff:  Circulator: Jefm Bryant, RN; Gillis Santa, RN Physician Assistant: Jillyn Ledger, PA-C Scrub Person: Wynell Balloon, CST; Lennette Bihari  Estimated Blood Loss: Minimal               Indications: This is an 87 year old female who presented with small bowel obstruction.  With conservative management, bowel rest, nasogastric suctioning, her obstruction did not improve after several days so decision was made to proceed to the operating room for exploration  Findings: The patient was found to have a large amount of intra-abdominal adhesions.  There was a transition zone secondary to the adhesions and she was developing a closed-loop obstruction.  No ischemic bowel was identified.  Procedure: The patient was brought to the operating room and identifies correct patient.  She was placed upon the operating table general esthesia was induced.  Her abdomen was prepped and draped in the usual sterile fashion.  I created a lower midline incision through the patient's previous scar.  We then took this down to the fascia and identified previous sutures which were removed.  We then gained entrance into the abdominal cavity.  The patient had a moderate out of free fluid.  There was extensively dilated small bowel and a transition zone could not be immediately seen to collapse the bowel.  The patient had extensive adhesions recreating both an obstruction and a near closed-loop obstruction.  We lysed adhesions with Metzenbaum scissors between loops of small bowel adhered to each other as well as bowel adhered down to the pelvis and the colon anastomosis.  We completely were able to  lyse all adhesions and free up all the loops of adhered small bowel.  The small bowel wall was quite thinned especially the distended areas.  Multiple serosal tears that had occurred were closed with 3-0 silk sutures.  We ran the small bowel and saw no evidence of enterotomies.  We then milked small bowel contents from the distended bowel down to the collapsed small bowel specifically through the transition point easily.  At this point we irrigated the abdomen several liters normal saline.  Hemostasis appeared to be achieved.  We then closed the midline incision with a running #1 looped PDS suture.  The skin was then irrigated and closed skin staples.  A dressing was then applied.  The patient tolerated the procedure well.  All the counts were correct at the end of the procedure.  The patient was then extubated in the operating room and taken in stable condition to the recovery room.          Coralie Keens   Date: 07/22/2020  Time: 1:10 PM

## 2020-07-22 NOTE — Progress Notes (Signed)
Attempted to place PICC x2 (brachial and basilic) on right unsuccessful.  Unable to thread guidewires.  Visualized needle in vein and good blood return.  Bedside RN updated.

## 2020-07-22 NOTE — Transfer of Care (Signed)
Immediate Anesthesia Transfer of Care Note  Patient: Theresa Arnold  Procedure(s) Performed: EXPLORATORY LAPAROTOMY lysis of adhesions (N/A Abdomen)  Patient Location: PACU  Anesthesia Type:General  Level of Consciousness: awake, alert  and patient cooperative  Airway & Oxygen Therapy: Patient Spontanous Breathing and Patient connected to face mask oxygen  Post-op Assessment: Report given to RN and Post -op Vital signs reviewed and stable  Post vital signs: Reviewed and stable  Last Vitals:  Vitals Value Taken Time  BP 127/87 07/22/20 1317  Temp    Pulse 65 07/22/20 1318  Resp 22 07/22/20 1318  SpO2 97 % 07/22/20 1318  Vitals shown include unvalidated device data.  Last Pain:  Vitals:   07/22/20 1124  TempSrc: Oral  PainSc:       Patients Stated Pain Goal: 2 (05/39/76 7341)  Complications: No complications documented.

## 2020-07-22 NOTE — Anesthesia Postprocedure Evaluation (Signed)
Anesthesia Post Note  Patient: Theresa Arnold  Procedure(s) Performed: EXPLORATORY LAPAROTOMY lysis of adhesions (N/A Abdomen)     Patient location during evaluation: PACU Anesthesia Type: General Level of consciousness: awake and alert Pain management: pain level controlled Vital Signs Assessment: post-procedure vital signs reviewed and stable Respiratory status: spontaneous breathing, nonlabored ventilation, respiratory function stable and patient connected to nasal cannula oxygen Cardiovascular status: blood pressure returned to baseline and stable Postop Assessment: no apparent nausea or vomiting Anesthetic complications: no   No complications documented.  Last Vitals:  Vitals:   07/22/20 1400 07/22/20 1415  BP: 126/66 (!) 146/71  Pulse: 69 74  Resp: (!) 25 20  Temp:    SpO2: 96% 99%    Last Pain:  Vitals:   07/22/20 1415  TempSrc:   PainSc: 7                  Nena Hampe,W. EDMOND

## 2020-07-22 NOTE — Progress Notes (Signed)
PROGRESS NOTE    CHARLANE HOGLEN   [id number]  DOB: [birth year]  DOA: 07/19/2020     2  PCP: Biagio Borg, MD  CC: abd pain, N/V  Hospital Course: Ms. Andreassen is an 87 yo female with PMH diverticulitis (s/p Hartmann procedure 03/22/19 with takedown on 09/04/19), HLD, COPD who presented to the hospital with abdominal pain and nausea/vomiting.  She had reported that she had not had any food for approximately 1 week prior to admission and was only drinking Gatorade and water.  She also reported that she had no flatus or bowel movements also within 1 week. NG tube attempt was made in the ER however unsuccessful and IR guided placement was ordered. General surgery was also consulted as CT abdomen/pelvis showed a small bowel obstruction in the right lower quadrant with transition point. She had minimal improvement with bowel rest, NGT placement and was taken for ex lap on 07/22/2020.  She underwent lysis of adhesions and tolerated procedure well.  Due to her prolonged n.p.o. intake even prior to admission and potential ongoing n.p.o. status while awaiting return of bowel function, she was ordered to begin on TPN and PICC line placement on 07/22/2020.   Interval History:  Patient was taken to the OR this morning for LOA.  Tolerated procedure well and patient was seen in her room this afternoon.  She was awake, alert, and overall doing okay.  Having abdominal pain a little bit which was from surgery.  Denies any nausea, NGT remains in place.   Old records reviewed in assessment of this patient  ROS: Constitutional: negative for chills and fevers, Respiratory: negative for cough, Cardiovascular: negative for chest pain and Gastrointestinal: positive for abdominal pain and nausea  Assessment & Plan: * SBO (small bowel obstruction) (HCC) - CT abd/pelvis on admission shows SBO with transition point in RLQ; etiology likely adhesions from prior abdominal surgeries (03/2019 and 08/2019 for Hartmann's  and reversal) - s/p NGT placement with radiology on 1/8 -Continue fluids -Continue antiemetics and pain control - meets criteria for TPN; she's essentially 1 week from last nutrition intake at home and now is s/p ex lap on 1/10 with u/k length of time for return of bowel function. Pharmacy consult for TPN. PICC line ordered for placement - monitor labs s/p TPN  Macrocytosis without anemia - no anemia noted but has been in the past; may be hemoconcentrated if hasn't eaten in ~1 week  - trend CBC while on IVF - follow up B12, folate (both adequate)  Thrombocytopenia (HCC) -Appears to have a normal platelet count looking back several years.  Mild thrombocytopenia started around February 2021 at her last surgery -Platelet count on admission 136k - no bleeding - monitor for now; remaining stable ~130  Hypokalemia - replete and recheck as needed  COPD (chronic obstructive pulmonary disease) ([place]) - no s/s exacerbation   Diverticulosis of colon - s/p hx of Hartmann's procedure for obstructed diverticulitis  - now s/p LOA on 07/22/20  Hiccups-resolved as of 07/22/2020 - supportive care for now   Antimicrobials: N/A  DVT prophylaxis: Lovenox Code Status: Full Family Communication: None present Disposition Plan: Status is: Inpatient  Remains inpatient appropriate because:Ongoing diagnostic testing needed not appropriate for outpatient work up, Unsafe d/c plan, IV treatments appropriate due to intensity of illness or inability to take PO and Inpatient level of care appropriate due to severity of illness   Dispo: The patient is from: Home  Anticipated d/c is to: Home              Anticipated d/c date is: > 3 days              Patient currently is not medically stable to d/c.   Objective: Blood pressure 130/72, pulse 94, temperature (!) 97.4 F (36.3 C), temperature source Oral, resp. rate 14, height 5' (1.524 m), weight 55.9 kg, SpO2 95 %.  Examination: General  appearance: alert, cooperative and no distress Head: Normocephalic, without obvious abnormality, atraumatic  Nose: NGT in place Eyes: EOMI Lungs: clear to auscultation bilaterally Heart: regular rate and rhythm and S1, S2 normal Abdomen: Abdominal incision noted, staples in place and covered with bandage.  Hypoactive bowel sounds.  Appropriately tender to palpation throughout Extremities: No edema Skin: mobility and turgor normal Neurologic: Grossly normal  Consultants:   General surgery  Procedures:     Data Reviewed: I have personally reviewed following labs and imaging studies Results for orders placed or performed during the hospital encounter of 07/19/20 (from the past 24 hour(s))  Basic metabolic panel     Status: Abnormal   Collection Time: 07/22/20  7:03 AM  Result Value Ref Range   Sodium 142 135 - 145 mmol/L   Potassium 3.1 (L) 3.5 - 5.1 mmol/L   Chloride 103 98 - 111 mmol/L   CO2 23 22 - 32 mmol/L   Glucose, Bld 105 (H) 70 - 99 mg/dL   BUN 22 8 - 23 mg/dL   Creatinine, Ser 0.52 0.44 - 1.00 mg/dL   Calcium 8.6 (L) 8.9 - 10.3 mg/dL   GFR, Estimated >60 >60 mL/min   Anion gap 16 (H) 5 - 15  CBC with Differential/Platelet     Status: Abnormal   Collection Time: 07/22/20  7:03 AM  Result Value Ref Range   WBC 6.6 4.0 - 10.5 K/uL   RBC 3.88 3.87 - 5.11 MIL/uL   Hemoglobin 14.1 12.0 - 15.0 g/dL   HCT 43.1 36.0 - 46.0 %   MCV 111.1 (H) 80.0 - 100.0 fL   MCH 36.3 (H) 26.0 - 34.0 pg   MCHC 32.7 30.0 - 36.0 g/dL   RDW 13.7 11.5 - 15.5 %   Platelets 147 (L) 150 - 400 K/uL   nRBC 0.0 0.0 - 0.2 %   Neutrophils Relative % 69 %   Neutro Abs 4.6 1.7 - 7.7 K/uL   Lymphocytes Relative 16 %   Lymphs Abs 1.1 0.7 - 4.0 K/uL   Monocytes Relative 14 %   Monocytes Absolute 0.9 0.1 - 1.0 K/uL   Eosinophils Relative 0 %   Eosinophils Absolute 0.0 0.0 - 0.5 K/uL   Basophils Relative 0 %   Basophils Absolute 0.0 0.0 - 0.1 K/uL   Immature Granulocytes 1 %   Abs Immature  Granulocytes 0.03 0.00 - 0.07 K/uL  Magnesium     Status: None   Collection Time: 07/22/20  7:03 AM  Result Value Ref Range   Magnesium 2.0 1.7 - 2.4 mg/dL  Phosphorus     Status: None   Collection Time: 07/22/20  7:03 AM  Result Value Ref Range   Phosphorus 2.6 2.5 - 4.6 mg/dL  Surgical pcr screen     Status: None   Collection Time: 07/22/20  9:46 AM   Specimen: Nasal Mucosa; Nasal Swab  Result Value Ref Range   MRSA, PCR NEGATIVE NEGATIVE   Staphylococcus aureus NEGATIVE NEGATIVE    Recent Results (from the past  240 hour(s))  Resp Panel by RT-PCR (Flu A&B, Covid) Nasopharyngeal Swab     Status: None   Collection Time: 07/17/20  9:18 AM   Specimen: Nasopharyngeal Swab; Nasopharyngeal(NP) swabs in vial transport medium  Result Value Ref Range Status   SARS Coronavirus 2 by RT PCR NEGATIVE NEGATIVE Final    Comment: (NOTE) SARS-CoV-2 target nucleic acids are NOT DETECTED.  The SARS-CoV-2 RNA is generally detectable in upper respiratory specimens during the acute phase of infection. The lowest concentration of SARS-CoV-2 viral copies this assay can detect is 138 copies/mL. A negative result does not preclude SARS-Cov-2 infection and should not be used as the sole basis for treatment or other patient management decisions. A negative result may occur with  improper specimen collection/handling, submission of specimen other than nasopharyngeal swab, presence of viral mutation(s) within the areas targeted by this assay, and inadequate number of viral copies(<138 copies/mL). A negative result must be combined with clinical observations, patient history, and epidemiological information. The expected result is Negative.  Fact Sheet for Patients:  EntrepreneurPulse.com.au  Fact Sheet for Healthcare Providers:  IncredibleEmployment.be  This test is no t yet approved or cleared by the [place] FDA and  has been authorized for detection and/or  diagnosis of SARS-CoV-2 by FDA under an Emergency Use Authorization (EUA). This EUA will remain  in effect (meaning this test can be used) for the duration of the COVID-19 declaration under Section 564(b)(1) of the Act, 21 U.S.C.section 360bbb-3(b)(1), unless the authorization is terminated  or revoked sooner.       Influenza A by PCR NEGATIVE NEGATIVE Final   Influenza B by PCR NEGATIVE NEGATIVE Final    Comment: (NOTE) The Xpert Xpress SARS-CoV-2/FLU/RSV plus assay is intended as an aid in the diagnosis of influenza from Nasopharyngeal swab specimens and should not be used as a sole basis for treatment. Nasal washings and aspirates are unacceptable for Xpert Xpress SARS-CoV-2/FLU/RSV testing.  Fact Sheet for Patients: EntrepreneurPulse.com.au  Fact Sheet for Healthcare Providers: IncredibleEmployment.be  This test is not yet approved or cleared by the [place] FDA and has been authorized for detection and/or diagnosis of SARS-CoV-2 by FDA under an Emergency Use Authorization (EUA). This EUA will remain in effect (meaning this test can be used) for the duration of the COVID-19 declaration under Section 564(b)(1) of the Act, 21 U.S.C. section 360bbb-3(b)(1), unless the authorization is terminated or revoked.  Performed at [facility], [place] [address], [place], [place] [address]   Resp Panel by RT-PCR (Flu A&B, Covid) Nasopharyngeal Swab     Status: None   Collection Time: 07/20/20  3:05 AM   Specimen: Nasopharyngeal Swab; Nasopharyngeal(NP) swabs in vial transport medium  Result Value Ref Range Status   SARS Coronavirus 2 by RT PCR NEGATIVE NEGATIVE Final    Comment: (NOTE) SARS-CoV-2 target nucleic acids are NOT DETECTED.  The SARS-CoV-2 RNA is generally detectable in upper respiratory specimens during the acute phase of infection. The lowest concentration of SARS-CoV-2 viral copies this assay can detect is 138  copies/mL. A negative result does not preclude SARS-Cov-2 infection and should not be used as the sole basis for treatment or other patient management decisions. A negative result may occur with  improper specimen collection/handling, submission of specimen other than nasopharyngeal swab, presence of viral mutation(s) within the areas targeted by this assay, and inadequate number of viral copies(<138 copies/mL). A negative result must be combined with clinical observations, patient history, and epidemiological information. The expected result  is Negative.  Fact Sheet for Patients:  EntrepreneurPulse.com.au  Fact Sheet for Healthcare Providers:  IncredibleEmployment.be  This test is no t yet approved or cleared by the [place] FDA and  has been authorized for detection and/or diagnosis of SARS-CoV-2 by FDA under an Emergency Use Authorization (EUA). This EUA will remain  in effect (meaning this test can be used) for the duration of the COVID-19 declaration under Section 564(b)(1) of the Act, 21 U.S.C.section 360bbb-3(b)(1), unless the authorization is terminated  or revoked sooner.       Influenza A by PCR NEGATIVE NEGATIVE Final   Influenza B by PCR NEGATIVE NEGATIVE Final    Comment: (NOTE) The Xpert Xpress SARS-CoV-2/FLU/RSV plus assay is intended as an aid in the diagnosis of influenza from Nasopharyngeal swab specimens and should not be used as a sole basis for treatment. Nasal washings and aspirates are unacceptable for Xpert Xpress SARS-CoV-2/FLU/RSV testing.  Fact Sheet for Patients: EntrepreneurPulse.com.au  Fact Sheet for Healthcare Providers: IncredibleEmployment.be  This test is not yet approved or cleared by the [place] FDA and has been authorized for detection and/or diagnosis of SARS-CoV-2 by FDA under an Emergency Use Authorization (EUA). This EUA will remain in effect (meaning  this test can be used) for the duration of the COVID-19 declaration under Section 564(b)(1) of the Act, 21 U.S.C. section 360bbb-3(b)(1), unless the authorization is terminated or revoked.  Performed at [facility], [place] [address] [place], [place] [address]   Surgical pcr screen     Status: None   Collection Time: 07/22/20  9:46 AM   Specimen: Nasal Mucosa; Nasal Swab  Result Value Ref Range Status   MRSA, PCR NEGATIVE NEGATIVE Final   Staphylococcus aureus NEGATIVE NEGATIVE Final    Comment: (NOTE) The Xpert SA Assay (FDA approved for NASAL specimens in patients 66 years of age and older), is one component of a comprehensive surveillance program. It is not intended to diagnose infection nor to guide or monitor treatment. Performed at [facility], [place] [address] [place], [place] [address]      Radiology Studies: DG Abd Portable 1V  Result Date: 07/22/2020 CLINICAL DATA:  Small-bowel obstruction EXAM: PORTABLE ABDOMEN - 1 VIEW COMPARISON:  Portable exam 0752 hours compared to 07/20/2020 FINDINGS: Persistent significant diffuse small bowel dilatation. No bowel wall thickening. Paucity of colonic gas. Findings consistent with small-bowel obstruction. Nasogastric tube coiled in stomach. Small amount of excreted contrast in bladder. Bones demineralized. IMPRESSION: Persistent small bowel obstruction. Electronically Signed   By: Lavonia Dana M.D.   On: 07/22/2020 08:27   DG Abd Portable 1V-Small Bowel Obstruction Protocol-initial, 8 hr delay  Result Date: 07/20/2020 CLINICAL DATA:  Small bowel obstruction.  8 hour delayed film. EXAM: PORTABLE ABDOMEN - 1 VIEW COMPARISON:  Abdominal radiograph and CT earlier today. FINDINGS: Tip and side port of the enteric tube below the diaphragm in the stomach. Persisting gaseous small bowel distension from earlier today. Contrast in the pelvis is likely excreted IV contrast in the urinary bladder from recent CT.  There is no enteric contrast in the colon. No evidence of free air. IMPRESSION: 1. Persistent small bowel distension consistent with obstruction. There is no enteric contrast in the colon. 2. Contrast in the pelvis is likely excreted IV contrast in the urinary bladder from CT earlier today. Electronically Signed   By: Keith Rake M.D.   On: 07/20/2020 22:39   [place] EKG SITE RITE  Result Date: 07/22/2020 If [facility]  image not attached, placement could not be confirmed due to current cardiac rhythm.  [place] EKG SITE RITE  Result Date: 07/22/2020 If Site Rite image not attached, placement could not be confirmed due to current cardiac rhythm.  [place] EKG SITE RITE  Final Result    [place] EKG SITE RITE  Final Result    DG Abd Portable 1V  Final Result    DG Abd Portable 1V-Small Bowel Obstruction Protocol-initial, 8 hr delay  Final Result    DG Abd Portable 1V-Small Bowel Protocol-Position Verification  Final Result    DG Naso G Tube Plc W/Fl W/Rad  Final Result    CT ABDOMEN PELVIS W CONTRAST  Final Result      Scheduled Meds: . Chlorhexidine Gluconate Cloth  6 each Topical Daily  . [START ON 07/23/2020] enoxaparin (LOVENOX) injection  40 mg Subcutaneous Q24H  . HYDROmorphone      . HYDROmorphone      . insulin aspart  0-6 Units Subcutaneous Q6H  . lip balm  1 application Topical BID   PRN Meds: alum & mag hydroxide-simeth, HYDROmorphone (DILAUDID) injection, magic mouthwash, menthol-cetylpyridinium, methocarbamol (ROBAXIN) IV, ondansetron (ZOFRAN) IV **OR** [DISCONTINUED] ondansetron (ZOFRAN) IV, ondansetron **OR** [DISCONTINUED] ondansetron (ZOFRAN) IV, phenol Continuous Infusions: . acetaminophen    . acetaminophen    . lactated ringers 100 mL/hr at 07/22/20 0911  . methocarbamol (ROBAXIN) IV    . TPN ADULT (ION)       LOS: 2 days  Time spent: Greater than 50% of the 35 minute visit was spent in counseling/coordination of care for the patient as laid out in the A&P.   Dwyane Dee, MD [facility] 07/22/2020, 3:48 PM

## 2020-07-22 NOTE — Anesthesia Preprocedure Evaluation (Addendum)
Anesthesia Evaluation  Patient identified by MRN, date of birth, ID band Patient awake    Reviewed: NPO status , Patient's Chart, lab work & pertinent test results  Airway Mallampati: II  TM Distance: >3 FB Neck ROM: Full    Dental  (+) Teeth Intact   Pulmonary COPD, former smoker,    Pulmonary exam normal        Cardiovascular negative cardio ROS   Rhythm:Regular Rate:Normal     Neuro/Psych negative psych ROS   GI/Hepatic Neg liver ROS, SBO, diverticulitis with prior colostomy/reversal   Endo/Other  negative endocrine ROS  Renal/GU negative Renal ROS  negative genitourinary   Musculoskeletal  (+) Arthritis , Osteoarthritis,  Cervical spondylosis   Abdominal (+)  Abdomen: soft. Bowel sounds: normal.  Peds  Hematology  (+) anemia ,   Anesthesia Other Findings   Reproductive/Obstetrics                            Anesthesia Physical Anesthesia Plan  ASA: II  Anesthesia Plan: General   Post-op Pain Management:    Induction: Intravenous and Rapid sequence  PONV Risk Score and Plan: 3 and Ondansetron, Dexamethasone and Treatment may vary due to age or medical condition  Airway Management Planned: Mask and Oral ETT  Additional Equipment: None  Intra-op Plan:   Post-operative Plan: Extubation in OR  Informed Consent: I have reviewed the patients History and Physical, chart, labs and discussed the procedure including the risks, benefits and alternatives for the proposed anesthesia with the patient or authorized representative who has indicated his/her understanding and acceptance.     Dental advisory given  Plan Discussed with: CRNA  Anesthesia Plan Comments: (Lab Results      Component                Value               Date                      WBC                      6.6                 07/22/2020                HGB                      14.1                07/22/2020                 HCT                      43.1                07/22/2020                MCV                      111.1 (H)           07/22/2020                PLT                      147 (L)  07/22/2020           Lab Results      Component                Value               Date                      NA                       142                 07/22/2020                K                        3.1 (L)             07/22/2020                CO2                      23                  07/22/2020                GLUCOSE                  105 (H)             07/22/2020                BUN                      22                  07/22/2020                CREATININE               0.52                07/22/2020                CALCIUM                  8.6 (L)             07/22/2020                GFRNONAA                 >60                 07/22/2020                GFRAA                    >60                 09/09/2019          )        Anesthesia Quick Evaluation

## 2020-07-23 ENCOUNTER — Inpatient Hospital Stay: Payer: BC Managed Care – PPO | Admitting: Internal Medicine

## 2020-07-23 ENCOUNTER — Encounter (HOSPITAL_COMMUNITY): Payer: Self-pay | Admitting: Surgery

## 2020-07-23 DIAGNOSIS — E876 Hypokalemia: Secondary | ICD-10-CM

## 2020-07-23 DIAGNOSIS — D7589 Other specified diseases of blood and blood-forming organs: Secondary | ICD-10-CM

## 2020-07-23 DIAGNOSIS — D696 Thrombocytopenia, unspecified: Secondary | ICD-10-CM

## 2020-07-23 DIAGNOSIS — K56609 Unspecified intestinal obstruction, unspecified as to partial versus complete obstruction: Secondary | ICD-10-CM | POA: Diagnosis not present

## 2020-07-23 DIAGNOSIS — J449 Chronic obstructive pulmonary disease, unspecified: Secondary | ICD-10-CM

## 2020-07-23 DIAGNOSIS — K5730 Diverticulosis of large intestine without perforation or abscess without bleeding: Secondary | ICD-10-CM

## 2020-07-23 LAB — GLUCOSE, CAPILLARY
Glucose-Capillary: 111 mg/dL — ABNORMAL HIGH (ref 70–99)
Glucose-Capillary: 130 mg/dL — ABNORMAL HIGH (ref 70–99)
Glucose-Capillary: 155 mg/dL — ABNORMAL HIGH (ref 70–99)
Glucose-Capillary: 198 mg/dL — ABNORMAL HIGH (ref 70–99)

## 2020-07-23 LAB — COMPREHENSIVE METABOLIC PANEL
ALT: 14 U/L (ref 0–44)
AST: 20 U/L (ref 15–41)
Albumin: 2.6 g/dL — ABNORMAL LOW (ref 3.5–5.0)
Alkaline Phosphatase: 30 U/L — ABNORMAL LOW (ref 38–126)
Anion gap: 14 (ref 5–15)
BUN: 28 mg/dL — ABNORMAL HIGH (ref 8–23)
CO2: 24 mmol/L (ref 22–32)
Calcium: 7.9 mg/dL — ABNORMAL LOW (ref 8.9–10.3)
Chloride: 102 mmol/L (ref 98–111)
Creatinine, Ser: 0.78 mg/dL (ref 0.44–1.00)
GFR, Estimated: >60 mL/min (ref >60)
Glucose, Bld: 133 mg/dL — ABNORMAL HIGH (ref 70–99)
Potassium: 3.1 mmol/L — ABNORMAL LOW (ref 3.5–5.1)
Sodium: 140 mmol/L (ref 135–145)
Total Bilirubin: 1.2 mg/dL (ref 0.3–1.2)
Total Protein: 5.3 g/dL — ABNORMAL LOW (ref 6.5–8.1)

## 2020-07-23 LAB — CBC WITH DIFFERENTIAL/PLATELET
Abs Immature Granulocytes: 0.04 10*3/uL (ref 0.00–0.07)
Basophils Absolute: 0 10*3/uL (ref 0.0–0.1)
Basophils Relative: 1 %
Eosinophils Absolute: 0 10*3/uL (ref 0.0–0.5)
Eosinophils Relative: 0 %
HCT: 38.5 % (ref 36.0–46.0)
Hemoglobin: 12.7 g/dL (ref 12.0–15.0)
Immature Granulocytes: 1 %
Lymphocytes Relative: 17 %
Lymphs Abs: 1.3 10*3/uL (ref 0.7–4.0)
MCH: 35.9 pg — ABNORMAL HIGH (ref 26.0–34.0)
MCHC: 33 g/dL (ref 30.0–36.0)
MCV: 108.8 fL — ABNORMAL HIGH (ref 80.0–100.0)
Monocytes Absolute: 0.7 10*3/uL (ref 0.1–1.0)
Monocytes Relative: 9 %
Neutro Abs: 5.6 10*3/uL (ref 1.7–7.7)
Neutrophils Relative %: 72 %
Platelets: 158 10*3/uL (ref 150–400)
RBC: 3.54 MIL/uL — ABNORMAL LOW (ref 3.87–5.11)
RDW: 13.5 % (ref 11.5–15.5)
WBC: 7.6 10*3/uL (ref 4.0–10.5)
nRBC: 0 % (ref 0.0–0.2)

## 2020-07-23 LAB — TRIGLYCERIDES: Triglycerides: 83 mg/dL (ref <150)

## 2020-07-23 LAB — PREALBUMIN: Prealbumin: 6.7 mg/dL — ABNORMAL LOW (ref 18–38)

## 2020-07-23 LAB — MAGNESIUM: Magnesium: 2 mg/dL (ref 1.7–2.4)

## 2020-07-23 LAB — PHOSPHORUS: Phosphorus: 3.3 mg/dL (ref 2.5–4.6)

## 2020-07-23 MED ORDER — TRACE MINERALS CU-MN-SE-ZN 300-55-60-3000 MCG/ML IV SOLN
INTRAVENOUS | Status: AC
  Filled 2020-07-23: qty 216

## 2020-07-23 MED ORDER — ACETAMINOPHEN 10 MG/ML IV SOLN
1000.0000 mg | Freq: Four times a day (QID) | INTRAVENOUS | Status: AC
  Administered 2020-07-23 – 2020-07-24 (×4): 1000 mg via INTRAVENOUS
  Filled 2020-07-23 (×6): qty 100

## 2020-07-23 MED ORDER — METHOCARBAMOL 1000 MG/10ML IJ SOLN
1000.0000 mg | Freq: Four times a day (QID) | INTRAVENOUS | Status: DC
  Administered 2020-07-23 – 2020-07-25 (×8): 1000 mg via INTRAVENOUS
  Filled 2020-07-23 (×7): qty 1000
  Filled 2020-07-23: qty 10

## 2020-07-23 MED ORDER — SODIUM CHLORIDE 0.9% FLUSH
10.0000 mL | INTRAVENOUS | Status: DC | PRN
  Administered 2020-07-27: 04:00:00 10 mL

## 2020-07-23 MED ORDER — POTASSIUM CHLORIDE 10 MEQ/100ML IV SOLN
10.0000 meq | INTRAVENOUS | Status: AC
  Administered 2020-07-23 (×4): 10 meq via INTRAVENOUS
  Filled 2020-07-23 (×4): qty 100

## 2020-07-23 MED ORDER — SODIUM CHLORIDE 0.9% FLUSH
10.0000 mL | Freq: Two times a day (BID) | INTRAVENOUS | Status: DC
  Administered 2020-07-25 – 2020-08-01 (×2): 10 mL

## 2020-07-23 NOTE — Progress Notes (Signed)
PHARMACY - TOTAL PARENTERAL NUTRITION CONSULT NOTE   Indication: Bowel obstruction  Patient Measurements: Height: 5' (152.4 cm) Weight: 55.9 kg (123 lb 3.8 oz) IBW/kg (Calculated) : 45.5 TPN AdjBW (KG): 49.4 Body mass index is 24.07 kg/m. Usual Weight:   Assessment:  Theresa Arnold is an 87 yo female with PMH diverticulitis (s/p Hartmann procedure 03/22/19 with takedown on 09/04/19), HLD, COPD who presented to the hospital with abdominal pain and nausea/vomiting.  She had reported that she had not had any food for approximately 1 week prior to admission and was only drinking Gatorade and water. She also reported that she had no flatus or bowel movements also within 1 week.  Glucose / Insulin: at goal (CBG < 150)  Electrolytes: K+ 3.1 low, pt received 1/4 of KCl runs that were ordered yesterday  , CorrCa is low end of nomal at 9.0, others WNL  Renal: WNL  LFTs / TGs: WNL/ 83 ( 1/11)  Prealbumin / albumin: 6.7 low, / albumin 2.6 WNL   Intake / Output; MIVF: + 239, on LR 70 ml/hr  GI Imaging: - 1/8 CT of abdomen shows mechanical small bowel obstruction with transition point in the right lower quadrant. Underlying adhesive disease is suspected. - 1/10 persistent SBO   Surgeries / Procedures:  - 1/10 to OR  for exploratory laparotomy   Central access: will obtain PICC after procedure on 1/10 per consult - PICC not obtained on 1/10, line finally placed 1/11  TPN started at 0939 on 1/11   TPN start date:  1/11  Nutritional Goals:  (per RD recommendation on 1/10):  Estimated Nutritional Needs:  Kcal:  1600-1800 Protein:  70-85g Fluid:  1.8L/day  Goal TPN rate is 70 mL/hr (provides 75.6  g of protein and 1777 kcals per day)  - Today KCl 10 meq IV x 4   Current Nutrition:  NPO  Plan:   Continue TPN at 21mL/hr at 1800 since will be getting less than half the initial bag ordered   Electrolytes in TPN: 52mEq/L of Na, 60mEq/L of K, 41mEq/L of Ca, 59mEq/L of Mg, and 44mmol/L of Phos.  Cl:Ac ratio 1:1  Add standard MVI and trace elements to TPN  Very Sensitive q6h SSI and adjust as needed   Continue MIVF to 70 mL/hr    BMP, magnesium, phosphorus with AM labs   Monitor TPN labs on Mon/Thurs    Royetta Asal, PharmD, BCPS 07/23/2020 10:06 AM

## 2020-07-23 NOTE — Evaluation (Signed)
Physical Therapy Evaluation Patient Details Name: Theresa Arnold MRN: [id number] DOB: [birth year] Today's Date: 07/23/2020   History of Present Illness  87 y.o. female with medical history significant for diverticulitis which required colostomy in past (colostomy has been reversed) who presents for evaluation of abdominal pain with persistent nausea and vomiting. Dx of SBO. s/p exploratory laparoscopy with LOA on 07/22/20.  Clinical Impression  Pt admitted with above diagnosis. Pt ambulated 75' with RW, no loss of balance. Min assist for bed mobility and transfers, 8/10 abdominal pain is primary limiting factor with mobility. Good progress expected.  Pt currently with functional limitations due to the deficits listed below (see PT Problem List). Pt will benefit from skilled PT to increase their independence and safety with mobility to allow discharge to the venue listed below.       Follow Up Recommendations Home health PT    Equipment Recommendations  None recommended by PT    Recommendations for Other Services       Precautions / Restrictions Precautions Precautions: Other (comment) Precaution Comments: abdominal surgery, NG tube Restrictions Weight Bearing Restrictions: No      Mobility  Bed Mobility Overal bed mobility: Needs Assistance Bed Mobility: Rolling;Sidelying to Sit Rolling: Min assist Sidelying to sit: Min assist       General bed mobility comments: VCs for log roll, min A to initiate roll, min A to raise trunk    Transfers Overall transfer level: Needs assistance Equipment used: Rolling walker (2 wheeled) Transfers: Sit to/from Stand Sit to Stand: Min assist;From elevated surface         General transfer comment: VCs hand placement, min A to power up, uncontrolled descent to recliner  Ambulation/Gait Ambulation/Gait assistance: Herbalist (Feet): 60 Feet Assistive device: Rolling walker (2 wheeled) Gait Pattern/deviations:  Step-through pattern;Decreased stride length;Trunk flexed Gait velocity: decr   General Gait Details: VCs to increase step length and for positioning in RW (pt walks too far behind frame of RW), distance limited by fatigue, no loss of balance  Stairs            Wheelchair Mobility    Modified Rankin (Stroke Patients Only)       Balance Overall balance assessment: Needs assistance   Sitting balance-Leahy Scale: Good       Standing balance-Leahy Scale: Fair                               Pertinent Vitals/Pain Pain Assessment: 0-10 Pain Score: 8  Pain Location: abdomen Pain Descriptors / Indicators: Sore;Grimacing;Guarding Pain Intervention(s): Limited activity within patient's tolerance;Monitored during session;Patient requesting pain meds-RN notified    Home Living Family/patient expects to be discharged to:: Private residence Living Arrangements: Spouse/significant other Available Help at Discharge: Family;Available 24 hours/day Type of Home: House Home Access: Stairs to enter   [employer] of Steps: 12 -has stair lift Home Layout: One level Home Equipment: Walker - 2 wheels;Shower seat      Prior Function Level of Independence: Independent         Comments: very independent     Hand Dominance        Extremity/Trunk Assessment   Upper Extremity Assessment Upper Extremity Assessment: Overall WFL for tasks assessed    Lower Extremity Assessment Lower Extremity Assessment: Overall WFL for tasks assessed    Cervical / Trunk Assessment Cervical / Trunk Assessment: Normal  Communication   Communication: No difficulties  Cognition  Arousal/Alertness: Awake/alert Behavior During Therapy: WFL for tasks assessed/performed Overall Cognitive Status: Within Functional Limits for tasks assessed                                        General Comments      Exercises     Assessment/Plan    PT Assessment  Patient needs continued PT services  PT Problem List Decreased activity tolerance;Decreased mobility;Pain       PT Treatment Interventions Gait training;Therapeutic activities;Therapeutic exercise;Patient/family education    PT Goals (Current goals can be found in the Care Plan section)  Acute Rehab PT Goals Patient Stated Goal: return to independence with mobility PT Goal Formulation: With patient Time For Goal Achievement: 08/06/20 Potential to Achieve Goals: Good    Frequency Min 3X/week   Barriers to discharge        Co-evaluation               AM-PAC PT "6 Clicks" Mobility  Outcome Measure Help needed turning from your back to your side while in a flat bed without using bedrails?: A Little Help needed moving from lying on your back to sitting on the side of a flat bed without using bedrails?: A Little Help needed moving to and from a bed to a chair (including a wheelchair)?: A Little Help needed standing up from a chair using your arms (e.g., wheelchair or bedside chair)?: A Little Help needed to walk in hospital room?: A Little Help needed climbing 3-5 steps with a railing? : A Lot 6 Click Score: 17    End of Session   Activity Tolerance: Patient limited by pain Patient left: in chair;with call bell/phone within reach;with chair alarm set Nurse Communication: Mobility status;Patient requests pain meds PT Visit Diagnosis: Pain;Difficulty in walking, not elsewhere classified (R26.2)    Time: 6767-2094 PT Time Calculation (min) (ACUTE ONLY): 25 min   Charges:   PT Evaluation $PT Eval Low Complexity: 1 Low PT Treatments $Gait Training: 8-22 mins       Blondell Reveal Kistler PT 07/23/2020  Acute Rehabilitation Services Pager [phone] Office [phone]

## 2020-07-23 NOTE — Addendum Note (Signed)
Addendum  created 07/23/20 1736 by Roderic Palau, MD   Intraprocedure Staff edited

## 2020-07-23 NOTE — Progress Notes (Signed)
Peripherally Inserted Central Catheter Placement  The IV Nurse has discussed with the patient and/or persons authorized to consent for the patient, the purpose of this procedure and the potential benefits and risks involved with this procedure.  The benefits include less needle sticks, lab draws from the catheter, and the patient may be discharged home with the catheter. Risks include, but not limited to, infection, bleeding, blood clot (thrombus formation), and puncture of an artery; nerve damage and irregular heartbeat and possibility to perform a PICC exchange if needed/ordered by physician.  Alternatives to this procedure were also discussed.  Bard Power PICC patient education guide, fact sheet on infection prevention and patient information card has been provided to patient /or left at bedside.    PICC Placement Documentation  PICC Double Lumen 07/23/20 PICC Left Brachial 43 cm 0 cm (Active)  Indication for Insertion or Continuance of Line Administration of hyperosmolar/irritating solutions (i.e. TPN, Vancomycin, etc.) 07/23/20 0800  Exposed Catheter (cm) 0 cm 07/23/20 0800  Site Assessment Clean;Dry;Intact 07/23/20 0800  Lumen #1 Status Flushed;Saline locked;Blood return noted 07/23/20 0800  Lumen #2 Status Flushed;Saline locked;Blood return noted 07/23/20 0800  Dressing Type Transparent;Securing device 07/23/20 0800  Dressing Status Clean;Dry;Intact 07/23/20 0800  Antimicrobial disc in place? Yes 07/23/20 0800  Safety Lock Not Applicable 27/51/70 0174  Line Care Connections checked and tightened 07/23/20 0800  Line Adjustment (NICU/IV Team Only) No 07/23/20 0800  Dressing Intervention New dressing 07/23/20 0800  Dressing Change Due 07/30/20 07/23/20 0800       Holley Bouche Renee 07/23/2020, 8:47 AM

## 2020-07-23 NOTE — Progress Notes (Signed)
PROGRESS NOTE  Theresa Arnold [id number] DOB: 1931-11-14 DOA: 07/19/2020 PCP: Theresa Borg, MD   LOS: 3 days   Brief narrative:  Theresa Arnold is an 87 yo female with PMH diverticulitis (s/p Hartmann procedure 03/22/19 with takedown on 09/04/19), hyperlipidemia, COPD presented to hospital with complaints of abdominal pain nausea and vomiting. NG tube attempt was made in the ER however unsuccessful and IR guided placement was ordered.  CT scan of the abdomen pelvis showed small bowel obstruction so general surgery was consulted.  Patient had minimal improvement with bowel rest, NGT placement and was taken for exploratory laparotomy on 07/22/2020.  She underwent lysis of adhesions and tolerated procedure well.  Due to her prolonged n.p.o. intake while awaiting return of bowel function, she was considered for TPN and PICC line placement due to expected postop ileus.  Assessment/Plan:  Principal Problem:   SBO (small bowel obstruction) (HCC) Active Problems:   Diverticulosis of colon   COPD (chronic obstructive pulmonary disease) (HCC)   Thrombocytopenia (HCC)   Hypokalemia   Macrocytosis without anemia   Small bowel obstruction. CT scan of the abdomen and pelvis showed small bowel obstruction with transition point in the right lower quadrant.  Patient underwent exploratory laparotomy on 07/22/20 with lysis of adhesions.  Patient does have a history of 03/2019 and 08/2019 for Hartmann's and reversal.  Patient did did have NG tube placement on 1/ /21 and is currently on NG tube.  We will continue NG tube drainage.  Status post PICC line placement and TPN for expected prolonged ileus.  Macrocytosis without anemia -B12, folate (both adequate).  Could well have vitamin B12 deficiency.  Could consider vitamin B12 on discharge for  Thrombocytopenia ([place]) -Improved at this time.  Latest platelet count of 158.  Monitor for bleeding.  Hypokalemia Continue to replenish aggressively especially  in the context of bowel obstruction.  We will administer 40 mEq of IV potassium today   COPD Continue nebulizers and inhalers.  Not in acute exacerbation.  History of diverticulosis of colon - s/p hx of Hartmann's procedure for obstructed diverticulitis  - now s/p LOA on 07/22/20   DVT prophylaxis: enoxaparin (LOVENOX) injection 40 mg Start: 07/23/20 1000    Code Status: Full code  Family Communication: None today  Status is: Inpatient  Remains inpatient appropriate because:IV treatments appropriate due to intensity of illness or inability to take PO, Inpatient level of care appropriate due to severity of illness and Ongoing NPO,   Dispo: The patient is from: Home              Anticipated d/c is to: Home              Anticipated d/c date is: 3 days or more              Patient currently is not medically stable to d/c.   Consultants:  General surgery  Procedures:  Exploratory laparotomy with lysis of adhesions on 07/22/2020.  Anti-inflectives:  . None  Anti-infectives (From admission, onward)   Start     Dose/Rate Route Frequency Ordered Stop   07/22/20 0915  ceFAZolin (ANCEF) IVPB 2g/100 mL premix        2 g 200 mL/hr over 30 Minutes Intravenous On call to O.R. 07/22/20 0908 07/22/20 1204       Subjective: Today, patient was seen and examined at bedside.  Patient denies any nausea vomiting but has NG tube.  Wants to eat.  Has not had a bowel  movement but has been having some flatus.  Status post surgery.  Objective: Vitals:   07/22/20 2152 07/23/20 0638  BP: 105/84 113/66  Pulse: 99 95  Resp: 18 16  Temp: 97.9 F (36.6 C) 98.1 F (36.7 C)  SpO2: 96% 95%    Intake/Output Summary (Last 24 hours) at 07/23/2020 0746 Last data filed at 07/23/2020 0600 Gross per 24 hour  Intake 2154.92 ml  Output 1915 ml  Net 239.92 ml   Filed Weights   07/19/20 1511 07/22/20 1043  Weight: 61.2 kg 55.9 kg   Body mass index is 24.07 kg/m.   Physical  Exam:  GENERAL: Patient is alert awake and communicative,. Not in obvious distress.,  Thinly built, HENT: No scleral pallor or icterus. Pupils equally reactive to light. Oral mucosa is moist.  Nasogastric tube in place. NECK: is supple, no gross swelling noted. CHEST: No crackles or wheezes.  Diminished breath sounds bilaterally. CVS: S1 and S2 heard, no murmur. Regular rate and rhythm.  ABDOMEN: Abdominal incision intact, staples in place.  Hypoactive bowel sounds.  Foley catheter in place EXTREMITIES: No edema.  Left upper extremity PICC line in place  CNS: Cranial nerves are intact. No focal motor deficits. SKIN: warm and dry without rashes.  Data Review: I have personally reviewed the following laboratory data and studies,  CBC: Recent Labs  Lab 07/17/20 0943 07/19/20 1535 07/20/20 0611 07/21/20 0550 07/22/20 0703 07/23/20 0525  WBC 8.6 8.7 7.2 8.1 6.6 7.6  NEUTROABS 6.5  --   --  5.9 4.6 5.6  HGB 15.6* 14.9 14.6 13.7 14.1 12.7  HCT 46.4* 45.4 43.8 40.4 43.1 38.5  MCV 108.4* 109.9* 108.7* 107.4* 111.1* 108.8*  PLT 136* 126* 130* 130* 147* 902   Basic Metabolic Panel: Recent Labs  Lab 07/19/20 1535 07/20/20 0611 07/21/20 0550 07/22/20 0703 07/23/20 0525  NA 139 139 144 142 140  K 3.3* 3.6 3.6 3.1* 3.1*  CL 99 99 105 103 102  CO2 24 26 24 23 24   GLUCOSE 144* 112* 97 105* 133*  BUN 27* 28* 23 22 28*  CREATININE 0.65 0.59 0.51 0.52 0.78  CALCIUM 9.4 9.7 8.9 8.6* 7.9*  MG  --  2.5* 2.3 2.0 2.0  PHOS  --   --  2.9 2.6 3.3   Liver Function Tests: Recent Labs  Lab 07/17/20 0953 07/19/20 1535 07/23/20 0525  AST 66* 61* 20  ALT 34 28 14  ALKPHOS 57 56 30*  BILITOT 1.1 1.6* 1.2  PROT 8.2* 8.2* 5.3*  ALBUMIN 4.2 4.2 2.6*   Recent Labs  Lab 07/17/20 0953  LIPASE 22   No results for input(s): AMMONIA in the last 168 hours. Cardiac Enzymes: No results for input(s): CKTOTAL, CKMB, CKMBINDEX, TROPONINI in the last 168 hours. BNP (last 3 results) No results  for input(s): BNP in the last 8760 hours.  ProBNP (last 3 results) No results for input(s): PROBNP in the last 8760 hours.  CBG: Recent Labs  Lab 07/23/20 0612  GLUCAP 111*   Recent Results (from the past 240 hour(s))  Resp Panel by RT-PCR (Flu A&B, Covid) Nasopharyngeal Swab     Status: None   Collection Time: 07/17/20  9:18 AM   Specimen: Nasopharyngeal Swab; Nasopharyngeal(NP) swabs in vial transport medium  Result Value Ref Range Status   SARS Coronavirus 2 by RT PCR NEGATIVE NEGATIVE Final    Comment: (NOTE) SARS-CoV-2 target nucleic acids are NOT DETECTED.  The SARS-CoV-2 RNA is generally detectable in upper respiratory  specimens during the acute phase of infection. The lowest concentration of SARS-CoV-2 viral copies this assay can detect is 138 copies/mL. A negative result does not preclude SARS-Cov-2 infection and should not be used as the sole basis for treatment or other patient management decisions. A negative result may occur with  improper specimen collection/handling, submission of specimen other than nasopharyngeal swab, presence of viral mutation(s) within the areas targeted by this assay, and inadequate number of viral copies(<138 copies/mL). A negative result must be combined with clinical observations, patient history, and epidemiological information. The expected result is Negative.  Fact Sheet for Patients:  EntrepreneurPulse.com.au  Fact Sheet for Healthcare Providers:  IncredibleEmployment.be  This test is no t yet approved or cleared by the [place] FDA and  has been authorized for detection and/or diagnosis of SARS-CoV-2 by FDA under an Emergency Use Authorization (EUA). This EUA will remain  in effect (meaning this test can be used) for the duration of the COVID-19 declaration under Section 564(b)(1) of the Act, 21 U.S.C.section 360bbb-3(b)(1), unless the authorization is terminated  or revoked sooner.        Influenza A by PCR NEGATIVE NEGATIVE Final   Influenza B by PCR NEGATIVE NEGATIVE Final    Comment: (NOTE) The Xpert Xpress SARS-CoV-2/FLU/RSV plus assay is intended as an aid in the diagnosis of influenza from Nasopharyngeal swab specimens and should not be used as a sole basis for treatment. Nasal washings and aspirates are unacceptable for Xpert Xpress SARS-CoV-2/FLU/RSV testing.  Fact Sheet for Patients: EntrepreneurPulse.com.au  Fact Sheet for Healthcare Providers: IncredibleEmployment.be  This test is not yet approved or cleared by the [place] FDA and has been authorized for detection and/or diagnosis of SARS-CoV-2 by FDA under an Emergency Use Authorization (EUA). This EUA will remain in effect (meaning this test can be used) for the duration of the COVID-19 declaration under Section 564(b)(1) of the Act, 21 U.S.C. section 360bbb-3(b)(1), unless the authorization is terminated or revoked.  Performed at [facility], [place] [address] [place], [place] [address]   Resp Panel by RT-PCR (Flu A&B, Covid) Nasopharyngeal Swab     Status: None   Collection Time: 07/20/20  3:05 AM   Specimen: Nasopharyngeal Swab; Nasopharyngeal(NP) swabs in vial transport medium  Result Value Ref Range Status   SARS Coronavirus 2 by RT PCR NEGATIVE NEGATIVE Final    Comment: (NOTE) SARS-CoV-2 target nucleic acids are NOT DETECTED.  The SARS-CoV-2 RNA is generally detectable in upper respiratory specimens during the acute phase of infection. The lowest concentration of SARS-CoV-2 viral copies this assay can detect is 138 copies/mL. A negative result does not preclude SARS-Cov-2 infection and should not be used as the sole basis for treatment or other patient management decisions. A negative result may occur with  improper specimen collection/handling, submission of specimen other than nasopharyngeal swab, presence of viral  mutation(s) within the areas targeted by this assay, and inadequate number of viral copies(<138 copies/mL). A negative result must be combined with clinical observations, patient history, and epidemiological information. The expected result is Negative.  Fact Sheet for Patients:  EntrepreneurPulse.com.au  Fact Sheet for Healthcare Providers:  IncredibleEmployment.be  This test is no t yet approved or cleared by the [place] FDA and  has been authorized for detection and/or diagnosis of SARS-CoV-2 by FDA under an Emergency Use Authorization (EUA). This EUA will remain  in effect (meaning this test can be used) for the duration of the COVID-19 declaration under Section 564(b)(1) of the  Act, 21 U.S.C.section 360bbb-3(b)(1), unless the authorization is terminated  or revoked sooner.       Influenza A by PCR NEGATIVE NEGATIVE Final   Influenza B by PCR NEGATIVE NEGATIVE Final    Comment: (NOTE) The Xpert Xpress SARS-CoV-2/FLU/RSV plus assay is intended as an aid in the diagnosis of influenza from Nasopharyngeal swab specimens and should not be used as a sole basis for treatment. Nasal washings and aspirates are unacceptable for Xpert Xpress SARS-CoV-2/FLU/RSV testing.  Fact Sheet for Patients: EntrepreneurPulse.com.au  Fact Sheet for Healthcare Providers: IncredibleEmployment.be  This test is not yet approved or cleared by the [place] FDA and has been authorized for detection and/or diagnosis of SARS-CoV-2 by FDA under an Emergency Use Authorization (EUA). This EUA will remain in effect (meaning this test can be used) for the duration of the COVID-19 declaration under Section 564(b)(1) of the Act, 21 U.S.C. section 360bbb-3(b)(1), unless the authorization is terminated or revoked.  Performed at [facility], [place] [address], [place], [place] [address]   Surgical pcr screen      Status: None   Collection Time: 07/22/20  9:46 AM   Specimen: Nasal Mucosa; Nasal Swab  Result Value Ref Range Status   MRSA, PCR NEGATIVE NEGATIVE Final   Staphylococcus aureus NEGATIVE NEGATIVE Final    Comment: (NOTE) The Xpert SA Assay (FDA approved for NASAL specimens in patients 58 years of age and older), is one component of a comprehensive surveillance program. It is not intended to diagnose infection nor to guide or monitor treatment. Performed at [facility], [place] [address], [place],  [address]      Studies: DG Abd Portable 1V  Result Date: 07/22/2020 CLINICAL DATA:  Small-bowel obstruction EXAM: PORTABLE ABDOMEN - 1 VIEW COMPARISON:  Portable exam 0752 hours compared to 07/20/2020 FINDINGS: Persistent significant diffuse small bowel dilatation. No bowel wall thickening. Paucity of colonic gas. Findings consistent with small-bowel obstruction. Nasogastric tube coiled in stomach. Small amount of excreted contrast in bladder. Bones demineralized. IMPRESSION: Persistent small bowel obstruction. Electronically Signed   By: Lavonia Dana M.D.   On: 07/22/2020 08:27   [place] EKG SITE RITE  Result Date: 07/22/2020 If Site Rite image not attached, placement could not be confirmed due to current cardiac rhythm.  [place] EKG SITE RITE  Result Date: 07/22/2020 If Site Rite image not attached, placement could not be confirmed due to current cardiac rhythm.     Flora Lipps, MD  [facility] 07/23/2020  If 7PM-7AM, please contact night-coverage

## 2020-07-23 NOTE — Progress Notes (Signed)
Progress Note  1 Day Post-Op  Subjective: Patient reports pain in throat and L jaw. No flatus. She feels weak and struggled with standing at bedside yesterday after surgery. We discussed ileus and starting TPN today.   Objective: Vital signs in last 24 hours: Temp:  [97.3 F (36.3 C)-98.1 F (36.7 C)] 98.1 F (36.7 C) (01/11 8676) Pulse Rate:  [63-105] 95 (01/11 0638) Resp:  [14-28] 16 (01/11 1950) BP: (105-171)/(66-105) 113/66 (01/11 9326) SpO2:  [93 %-100 %] 95 % (01/11 7124) Weight:  [55.9 kg] 55.9 kg (01/10 1043) Last BM Date: 07/20/20  Intake/Output from previous day: 01/10 0701 - 01/11 0700 In: 2154.9 [P.O.:170; I.V.:1485; IV Piggyback:499.9] Out: 1915 [PYKDX:8338; Emesis/NG output:425; Blood:100] Intake/Output this shift: Total I/O In: 0  Out: 300 [Urine:200; Emesis/NG output:100]  PE: General: pleasant, WD, elderly female who is laying in bed in NAD Heart: regular, rate, and rhythm.   Lungs: CTAB, no wheezes, rhonchi, or rales noted.  Respiratory effort nonlabored Abd: soft, appropriately ttp, ND, BS hypoactive, NGT with bilious drainage, honeycomb dressing present with some bloody drainage centrally and inferiorly  MS: all 4 extremities are symmetrical with no cyanosis, clubbing, or edema. Skin: warm and dry with no masses, lesions, or rashes Neuro: Cranial nerves 2-12 grossly intact, sensation is normal throughout Psych: A&Ox3 with an appropriate affect.    Lab Results:  Recent Labs    07/22/20 0703 07/23/20 0525  WBC 6.6 7.6  HGB 14.1 12.7  HCT 43.1 38.5  PLT 147* 158   BMET Recent Labs    07/22/20 0703 07/23/20 0525  NA 142 140  K 3.1* 3.1*  CL 103 102  CO2 23 24  GLUCOSE 105* 133*  BUN 22 28*  CREATININE 0.52 0.78  CALCIUM 8.6* 7.9*   PT/INR No results for input(s): LABPROT, INR in the last 72 hours. CMP     Component Value Date/Time   NA 140 07/23/2020 0525   K 3.1 (L) 07/23/2020 0525   CL 102 07/23/2020 0525   CO2 24  07/23/2020 0525   GLUCOSE 133 (H) 07/23/2020 0525   BUN 28 (H) 07/23/2020 0525   CREATININE 0.78 07/23/2020 0525   CALCIUM 7.9 (L) 07/23/2020 0525   PROT 5.3 (L) 07/23/2020 0525   ALBUMIN 2.6 (L) 07/23/2020 0525   AST 20 07/23/2020 0525   ALT 14 07/23/2020 0525   ALKPHOS 30 (L) 07/23/2020 0525   BILITOT 1.2 07/23/2020 0525   GFRNONAA >60 07/23/2020 0525   GFRAA >60 09/09/2019 0353   Lipase     Component Value Date/Time   LIPASE 22 07/17/2020 0953       Studies/Results: DG Abd Portable 1V  Result Date: 07/22/2020 CLINICAL DATA:  Small-bowel obstruction EXAM: PORTABLE ABDOMEN - 1 VIEW COMPARISON:  Portable exam 0752 hours compared to 07/20/2020 FINDINGS: Persistent significant diffuse small bowel dilatation. No bowel wall thickening. Paucity of colonic gas. Findings consistent with small-bowel obstruction. Nasogastric tube coiled in stomach. Small amount of excreted contrast in bladder. Bones demineralized. IMPRESSION: Persistent small bowel obstruction. Electronically Signed   By: Lavonia Dana M.D.   On: 07/22/2020 08:27   [place] EKG SITE RITE  Result Date: 07/22/2020 If Site Rite image not attached, placement could not be confirmed due to current cardiac rhythm.  [place] EKG SITE RITE  Result Date: 07/22/2020 If Site Rite image not attached, placement could not be confirmed due to current cardiac rhythm.   Anti-infectives: Anti-infectives (From admission, onward)   Start     Dose/Rate  Route Frequency Ordered Stop   07/22/20 0915  ceFAZolin (ANCEF) IVPB 2g/100 mL premix        2 g 200 mL/hr over 30 Minutes Intravenous On call to O.R. 07/22/20 0908 07/22/20 1204       Assessment/Plan COPD HLD Severe protein calorie malnutrition - prealbumin 6.7 today, TPN ordered  SBO S/p exploratory laparotomy with LOA 07/22/20 Dr. Ninfa Linden - POD#1 - patient with expected post-op ileus - start TPN, continue NGT to LIWS - keep foley today until mobilizing more - PT/OT ordered - await  return in bowel function - monitor labs  FEN: NPO, IVF, NGT to LIWS; TPN to start today VTE: lovenox ID: Ancef pre-op 1/10  LOS: 3 days    Norm Parcel , [facility] 07/23/2020, 10:27 AM Please see Amion for pager number during day hours 7:00am-4:30pm

## 2020-07-24 DIAGNOSIS — E876 Hypokalemia: Secondary | ICD-10-CM | POA: Diagnosis not present

## 2020-07-24 DIAGNOSIS — K5730 Diverticulosis of large intestine without perforation or abscess without bleeding: Secondary | ICD-10-CM | POA: Diagnosis not present

## 2020-07-24 DIAGNOSIS — K56609 Unspecified intestinal obstruction, unspecified as to partial versus complete obstruction: Secondary | ICD-10-CM | POA: Diagnosis not present

## 2020-07-24 DIAGNOSIS — J449 Chronic obstructive pulmonary disease, unspecified: Secondary | ICD-10-CM | POA: Diagnosis not present

## 2020-07-24 LAB — CBC WITH DIFFERENTIAL/PLATELET
Abs Immature Granulocytes: 0.03 10*3/uL (ref 0.00–0.07)
Basophils Absolute: 0 10*3/uL (ref 0.0–0.1)
Basophils Relative: 0 %
Eosinophils Absolute: 0 10*3/uL (ref 0.0–0.5)
Eosinophils Relative: 0 %
HCT: 33.6 % — ABNORMAL LOW (ref 36.0–46.0)
Hemoglobin: 11.1 g/dL — ABNORMAL LOW (ref 12.0–15.0)
Immature Granulocytes: 0 %
Lymphocytes Relative: 18 %
Lymphs Abs: 1.3 10*3/uL (ref 0.7–4.0)
MCH: 36.3 pg — ABNORMAL HIGH (ref 26.0–34.0)
MCHC: 33 g/dL (ref 30.0–36.0)
MCV: 109.8 fL — ABNORMAL HIGH (ref 80.0–100.0)
Monocytes Absolute: 0.5 10*3/uL (ref 0.1–1.0)
Monocytes Relative: 8 %
Neutro Abs: 5.1 10*3/uL (ref 1.7–7.7)
Neutrophils Relative %: 74 %
Platelets: 158 10*3/uL (ref 150–400)
RBC: 3.06 MIL/uL — ABNORMAL LOW (ref 3.87–5.11)
RDW: 13.7 % (ref 11.5–15.5)
WBC: 7 10*3/uL (ref 4.0–10.5)
nRBC: 0 % (ref 0.0–0.2)

## 2020-07-24 LAB — COMPREHENSIVE METABOLIC PANEL
ALT: 12 U/L (ref 0–44)
AST: 22 U/L (ref 15–41)
Albumin: 2.3 g/dL — ABNORMAL LOW (ref 3.5–5.0)
Alkaline Phosphatase: 26 U/L — ABNORMAL LOW (ref 38–126)
Anion gap: 8 (ref 5–15)
BUN: 18 mg/dL (ref 8–23)
CO2: 29 mmol/L (ref 22–32)
Calcium: 7.9 mg/dL — ABNORMAL LOW (ref 8.9–10.3)
Chloride: 102 mmol/L (ref 98–111)
Creatinine, Ser: 0.37 mg/dL — ABNORMAL LOW (ref 0.44–1.00)
GFR, Estimated: >60 mL/min (ref >60)
Glucose, Bld: 165 mg/dL — ABNORMAL HIGH (ref 70–99)
Potassium: 3.1 mmol/L — ABNORMAL LOW (ref 3.5–5.1)
Sodium: 139 mmol/L (ref 135–145)
Total Bilirubin: 0.3 mg/dL (ref 0.3–1.2)
Total Protein: 4.9 g/dL — ABNORMAL LOW (ref 6.5–8.1)

## 2020-07-24 LAB — MAGNESIUM: Magnesium: 1.9 mg/dL (ref 1.7–2.4)

## 2020-07-24 LAB — GLUCOSE, CAPILLARY
Glucose-Capillary: 158 mg/dL — ABNORMAL HIGH (ref 70–99)
Glucose-Capillary: 137 mg/dL — ABNORMAL HIGH (ref 70–99)
Glucose-Capillary: 130 mg/dL — ABNORMAL HIGH (ref 70–99)
Glucose-Capillary: 179 mg/dL — ABNORMAL HIGH (ref 70–99)
Glucose-Capillary: 162 mg/dL — ABNORMAL HIGH (ref 70–99)

## 2020-07-24 LAB — PHOSPHORUS: Phosphorus: 1 mg/dL — CL (ref 2.5–4.6)

## 2020-07-24 MED ORDER — POTASSIUM PHOSPHATES 15 MMOLE/5ML IV SOLN
30.0000 mmol | Freq: Once | INTRAVENOUS | Status: AC
  Administered 2020-07-24: 30 mmol via INTRAVENOUS
  Filled 2020-07-24: qty 10

## 2020-07-24 MED ORDER — TRACE MINERALS CU-MN-SE-ZN 300-55-60-3000 MCG/ML IV SOLN
INTRAVENOUS | Status: AC
  Filled 2020-07-24: qty 360

## 2020-07-24 MED ORDER — MAGNESIUM SULFATE 2 GM/50ML IV SOLN
2.0000 g | Freq: Once | INTRAVENOUS | Status: AC
  Administered 2020-07-24: 2 g via INTRAVENOUS
  Filled 2020-07-24: qty 50

## 2020-07-24 MED ORDER — LACTATED RINGERS IV BOLUS
250.0000 mL | Freq: Once | INTRAVENOUS | Status: AC
  Administered 2020-07-24: 250 mL via INTRAVENOUS

## 2020-07-24 MED ORDER — LACTATED RINGERS IV SOLN: INTRAVENOUS | Status: DC

## 2020-07-24 MED ORDER — KETOROLAC TROMETHAMINE 15 MG/ML IJ SOLN
15.0000 mg | Freq: Four times a day (QID) | INTRAMUSCULAR | Status: AC | PRN
  Administered 2020-07-24 – 2020-07-28 (×7): 15 mg via INTRAVENOUS
  Filled 2020-07-24 (×7): qty 1

## 2020-07-24 NOTE — Progress Notes (Signed)
Progress Note  2 Days Post-Op  Subjective: Patient doing well this AM. Reports abdomen is sore but pain is slightly better controlled. Sore throat from tube. Got up to chair yesterday. Reports passing some flatus.   Objective: Vital signs in last 24 hours: Temp:  [98.2 F (36.8 C)-99.4 F (37.4 C)] 98.2 F (36.8 C) (01/12 0527) Pulse Rate:  [81-87] 81 (01/12 0527) Resp:  [17-18] 18 (01/12 0527) BP: (107-126)/(63-72) 118/70 (01/12 0527) SpO2:  [96 %-97 %] 97 % (01/12 0527) Last BM Date: 07/20/20  Intake/Output from previous day: 01/11 0701 - 01/12 0700 In: 1894 [P.O.:120; I.V.:572.1; IV Piggyback:1201.9] Out: 2850 [Urine:2100; Emesis/NG output:750] Intake/Output this shift: No intake/output data recorded.  PE: General: pleasant, WD, elderly female who is laying in bed in NAD Heart: regular, rate, and rhythm.   Lungs: CTAB, no wheezes, rhonchi, or rales noted.  Respiratory effort nonlabored Abd: soft, appropriately ttp, ND, BS hypoactive, NGT with bilious drainage, honeycomb dressing present with some bloody drainage centrally and inferiorly  MS: all 4 extremities are symmetrical with no cyanosis, clubbing, or edema. Skin: warm and dry with no masses, lesions, or rashes Neuro: Cranial nerves 2-12 grossly intact, sensation is normal throughout Psych: A&Ox3 with an appropriate affect.   Lab Results:  Recent Labs    07/23/20 0525 07/24/20 0300  WBC 7.6 7.0  HGB 12.7 11.1*  HCT 38.5 33.6*  PLT 158 158   BMET Recent Labs    07/23/20 0525 07/24/20 0300  NA 140 139  K 3.1* 3.1*  CL 102 102  CO2 24 29  GLUCOSE 133* 165*  BUN 28* 18  CREATININE 0.78 0.37*  CALCIUM 7.9* 7.9*   PT/INR No results for input(s): LABPROT, INR in the last 72 hours. CMP     Component Value Date/Time   NA 139 07/24/2020 0300   K 3.1 (L) 07/24/2020 0300   CL 102 07/24/2020 0300   CO2 29 07/24/2020 0300   GLUCOSE 165 (H) 07/24/2020 0300   BUN 18 07/24/2020 0300   CREATININE 0.37  (L) 07/24/2020 0300   CALCIUM 7.9 (L) 07/24/2020 0300   PROT 4.9 (L) 07/24/2020 0300   ALBUMIN 2.3 (L) 07/24/2020 0300   AST 22 07/24/2020 0300   ALT 12 07/24/2020 0300   ALKPHOS 26 (L) 07/24/2020 0300   BILITOT 0.3 07/24/2020 0300   GFRNONAA >60 07/24/2020 0300   GFRAA >60 09/09/2019 0353   Lipase     Component Value Date/Time   LIPASE 22 07/17/2020 0953       Studies/Results: [place] EKG SITE RITE  Result Date: 07/22/2020 If Site Rite image not attached, placement could not be confirmed due to current cardiac rhythm.  [place] EKG SITE RITE  Result Date: 07/22/2020 If Site Rite image not attached, placement could not be confirmed due to current cardiac rhythm.   Anti-infectives: Anti-infectives (From admission, onward)   Start     Dose/Rate Route Frequency Ordered Stop   07/22/20 0915  ceFAZolin (ANCEF) IVPB 2g/100 mL premix        2 g 200 mL/hr over 30 Minutes Intravenous On call to O.R. 07/22/20 0908 07/22/20 1204       Assessment/Plan COPD HLD Severe protein calorie malnutrition - prealbumin 6.7 1/11, cont TPN  SBO S/p exploratory laparotomy with LOA 07/22/20 Dr. Ninfa Linden - POD#2 - patient passing a little flatus - cont TPN, clamping trials with NGT today  - remove foley today  - continue to mobilize - await return in bowel function -  monitor labs  FEN: NPO, IVF, NGT clamping trials, TPN VTE: lovenox ID: Ancef pre-op 1/10  LOS: 4 days    Norm Parcel , [facility] 07/24/2020, 8:46 AM Please see Amion for pager number during day hours 7:00am-4:30pm

## 2020-07-24 NOTE — Progress Notes (Signed)
PROGRESS NOTE  BATINA DOUGAN MEQ:[id number] DOB: [birth year] DOA: 07/19/2020 PCP: Biagio Borg, MD   LOS: 4 days   Brief narrative:  Ms. Olesky is an 87 yo female with PMH diverticulitis (s/p Hartmann procedure 03/22/19 with takedown on 09/04/19), hyperlipidemia, COPD presented to hospital with complaints of abdominal pain nausea and vomiting. NG tube attempt was made in the ER however unsuccessful and IR guided placement was ordered.  CT scan of the abdomen pelvis showed small bowel obstruction so general surgery was consulted.  Patient had minimal improvement with bowel rest, NGT placement and was taken for exploratory laparotomy on 07/22/2020.  She underwent lysis of adhesions and tolerated procedure well.  Due to her prolonged n.p.o. intake while awaiting return of bowel function, she was considered for TPN and PICC line placement due to expected postop ileus.  Assessment/Plan:  Principal Problem:   SBO (small bowel obstruction) (HCC) Active Problems:   Diverticulosis of colon   COPD (chronic obstructive pulmonary disease) (HCC)   Thrombocytopenia (HCC)   Hypokalemia   Macrocytosis without anemia  Small bowel obstruction. CT scan of the abdomen and pelvis showed small bowel obstruction with transition point in the right lower quadrant.  Patient underwent exploratory laparotomy on 07/22/20 with lysis of adhesions.  Patient does have a history of 03/2019 and 08/2019 for Hartmann's and reversal.  Currently on NG tube which will be clamped today as per general surgery.   Status post PICC line placement and TPN since 07/23/2020.  Follow surgical recommendations.  Macrocytosis without anemia -B12, folate (both adequate).  Could well have vitamin B12 deficiency.  Could consider vitamin B12 on discharge   Thrombocytopenia ([place]) -Improved at this time.  Latest platelet count of 158.  Continue to monitor for bleeding.  Hypokalemia Continue to replenish aggressively.  We will give potassium  phosphate.  Check levels in a.m.  Hypophosphatemia Will replenish with K-Phos today.  Check levels in a.m.  COPD Continue nebulizers and inhalers.  Not in acute exacerbation.  History of diverticulosis of colon - s/p hx of Hartmann's procedure for obstructed diverticulitis  - now s/p LOA on 07/22/20   DVT prophylaxis: enoxaparin (LOVENOX) injection 40 mg Start: 07/23/20 1000   Code Status: Full code  Family Communication:  I tried to call patient's son on the phone today but was unable to reach him  Status is: Inpatient  Remains inpatient appropriate because:IV treatments appropriate due to intensity of illness or inability to take PO, Inpatient level of care appropriate due to severity of illness and Ongoing NPO, electrolyte imbalance,   Dispo: The patient is from: Home              Anticipated d/c is to: Home              Anticipated d/c date is: 3 days or more              Patient currently is not medically stable to d/c.   Consultants:  General surgery  Procedures:  Exploratory laparotomy with lysis of adhesions on 07/22/2020.  PICC line placement on 07/23/20  Anti-infectives:  . None  Anti-infectives (From admission, onward)   Start     Dose/Rate Route Frequency Ordered Stop   07/22/20 0915  ceFAZolin (ANCEF) IVPB 2g/100 mL premix        2 g 200 mL/hr over 30 Minutes Intravenous On call to O.R. 07/22/20 0908 07/22/20 1204       Subjective: Today, patient was seen and examined.  Denies any nausea or vomiting. Has had flatus but no bowel movements.  Complains of mild abdominal discomfort.  Sore throat from NG tube.  Objective: Vitals:   07/23/20 2132 07/24/20 0527  BP: 126/72 118/70  Pulse: 83 81  Resp: 18 18  Temp: 99.4 F (37.4 C) 98.2 F (36.8 C)  SpO2: 97% 97%    Intake/Output Summary (Last 24 hours) at 07/24/2020 0952 Last data filed at 07/24/2020 0649 Gross per 24 hour  Intake 1893.96 ml  Output 2550 ml  Net -656.04 ml   Filed Weights    07/19/20 1511 07/22/20 1043  Weight: 61.2 kg 55.9 kg   Body mass index is 24.07 kg/m.   Physical Exam:  GENERAL: Patient is alert awake and communicative,. Not in obvious distress.,  Thinly built, HENT: No scleral pallor or icterus. Pupils equally reactive to light. Oral mucosa is moist.  Nasogastric tube in place has been clamped. NECK: is supple, no gross swelling noted. CHEST: No crackles or wheezes.  Diminished breath sounds bilaterally. CVS: S1 and S2 heard, no murmur. Regular rate and rhythm.  ABDOMEN: Abdominal incision intact, staples in place.  Hypoactive bowel sounds.  Foley catheter in place  EXTREMITIES: No edema.  Left upper extremity PICC line in place  CNS: Cranial nerves are intact. No focal motor deficits. SKIN: warm and dry without rashes.  Data Review: I have personally reviewed the following laboratory data and studies,  CBC: Recent Labs  Lab 07/20/20 0611 07/21/20 0550 07/22/20 0703 07/23/20 0525 07/24/20 0300  WBC 7.2 8.1 6.6 7.6 7.0  NEUTROABS  --  5.9 4.6 5.6 5.1  HGB 14.6 13.7 14.1 12.7 11.1*  HCT 43.8 40.4 43.1 38.5 33.6*  MCV 108.7* 107.4* 111.1* 108.8* 109.8*  PLT 130* 130* 147* 158 0000000   Basic Metabolic Panel: Recent Labs  Lab 07/20/20 0611 07/21/20 0550 07/22/20 0703 07/23/20 0525 07/24/20 0300  NA 139 144 142 140 139  K 3.6 3.6 3.1* 3.1* 3.1*  CL 99 105 103 102 102  CO2 26 24 23 24 29   GLUCOSE 112* 97 105* 133* 165*  BUN 28* 23 22 28* 18  CREATININE 0.59 0.51 0.52 0.78 0.37*  CALCIUM 9.7 8.9 8.6* 7.9* 7.9*  MG 2.5* 2.3 2.0 2.0 1.9  PHOS  --  2.9 2.6 3.3 1.0*   Liver Function Tests: Recent Labs  Lab 07/17/20 0953 07/19/20 1535 07/23/20 0525 07/24/20 0300  AST 66* 61* 20 22  ALT 34 28 14 12   ALKPHOS 57 56 30* 26*  BILITOT 1.1 1.6* 1.2 0.3  PROT 8.2* 8.2* 5.3* 4.9*  ALBUMIN 4.2 4.2 2.6* 2.3*   Recent Labs  Lab 07/17/20 0953  LIPASE 22   No results for input(s): AMMONIA in the last 168 hours. Cardiac Enzymes: No  results for input(s): CKTOTAL, CKMB, CKMBINDEX, TROPONINI in the last 168 hours. BNP (last 3 results) No results for input(s): BNP in the last 8760 hours.  ProBNP (last 3 results) No results for input(s): PROBNP in the last 8760 hours.  CBG: Recent Labs  Lab 07/23/20 1126 07/23/20 1826 07/23/20 2353 07/24/20 0617 07/24/20 0748  GLUCAP 130* 155* 198* 158* 137*   Recent Results (from the past 240 hour(s))  Resp Panel by RT-PCR (Flu A&B, Covid) Nasopharyngeal Swab     Status: None   Collection Time: 07/17/20  9:18 AM   Specimen: Nasopharyngeal Swab; Nasopharyngeal(NP) swabs in vial transport medium  Result Value Ref Range Status   SARS Coronavirus 2 by RT PCR NEGATIVE NEGATIVE  Final    Comment: (NOTE) SARS-CoV-2 target nucleic acids are NOT DETECTED.  The SARS-CoV-2 RNA is generally detectable in upper respiratory specimens during the acute phase of infection. The lowest concentration of SARS-CoV-2 viral copies this assay can detect is 138 copies/mL. A negative result does not preclude SARS-Cov-2 infection and should not be used as the sole basis for treatment or other patient management decisions. A negative result may occur with  improper specimen collection/handling, submission of specimen other than nasopharyngeal swab, presence of viral mutation(s) within the areas targeted by this assay, and inadequate number of viral copies(<138 copies/mL). A negative result must be combined with clinical observations, patient history, and epidemiological information. The expected result is Negative.  Fact Sheet for Patients:  EntrepreneurPulse.com.au  Fact Sheet for Healthcare Providers:  IncredibleEmployment.be  This test is no t yet approved or cleared by the [place] FDA and  has been authorized for detection and/or diagnosis of SARS-CoV-2 by FDA under an Emergency Use Authorization (EUA). This EUA will remain  in effect (meaning this  test can be used) for the duration of the COVID-19 declaration under Section 564(b)(1) of the Act, 21 U.S.C.section 360bbb-3(b)(1), unless the authorization is terminated  or revoked sooner.       Influenza A by PCR NEGATIVE NEGATIVE Final   Influenza B by PCR NEGATIVE NEGATIVE Final    Comment: (NOTE) The Xpert Xpress SARS-CoV-2/FLU/RSV plus assay is intended as an aid in the diagnosis of influenza from Nasopharyngeal swab specimens and should not be used as a sole basis for treatment. Nasal washings and aspirates are unacceptable for Xpert Xpress SARS-CoV-2/FLU/RSV testing.  Fact Sheet for Patients: EntrepreneurPulse.com.au  Fact Sheet for Healthcare Providers: IncredibleEmployment.be  This test is not yet approved or cleared by the [place] FDA and has been authorized for detection and/or diagnosis of SARS-CoV-2 by FDA under an Emergency Use Authorization (EUA). This EUA will remain in effect (meaning this test can be used) for the duration of the COVID-19 declaration under Section 564(b)(1) of the Act, 21 U.S.C. section 360bbb-3(b)(1), unless the authorization is terminated or revoked.  Performed at [facility], [place] [address] [place], [place] [address]   Resp Panel by RT-PCR (Flu A&B, Covid) Nasopharyngeal Swab     Status: None   Collection Time: 07/20/20  3:05 AM   Specimen: Nasopharyngeal Swab; Nasopharyngeal(NP) swabs in vial transport medium  Result Value Ref Range Status   SARS Coronavirus 2 by RT PCR NEGATIVE NEGATIVE Final    Comment: (NOTE) SARS-CoV-2 target nucleic acids are NOT DETECTED.  The SARS-CoV-2 RNA is generally detectable in upper respiratory specimens during the acute phase of infection. The lowest concentration of SARS-CoV-2 viral copies this assay can detect is 138 copies/mL. A negative result does not preclude SARS-Cov-2 infection and should not be used as the sole basis for treatment  or other patient management decisions. A negative result may occur with  improper specimen collection/handling, submission of specimen other than nasopharyngeal swab, presence of viral mutation(s) within the areas targeted by this assay, and inadequate number of viral copies(<138 copies/mL). A negative result must be combined with clinical observations, patient history, and epidemiological information. The expected result is Negative.  Fact Sheet for Patients:  EntrepreneurPulse.com.au  Fact Sheet for Healthcare Providers:  IncredibleEmployment.be  This test is no t yet approved or cleared by the [place] FDA and  has been authorized for detection and/or diagnosis of SARS-CoV-2 by FDA under an Emergency Use Authorization (EUA). This EUA  will remain  in effect (meaning this test can be used) for the duration of the COVID-19 declaration under Section 564(b)(1) of the Act, 21 U.S.C.section 360bbb-3(b)(1), unless the authorization is terminated  or revoked sooner.       Influenza A by PCR NEGATIVE NEGATIVE Final   Influenza B by PCR NEGATIVE NEGATIVE Final    Comment: (NOTE) The Xpert Xpress SARS-CoV-2/FLU/RSV plus assay is intended as an aid in the diagnosis of influenza from Nasopharyngeal swab specimens and should not be used as a sole basis for treatment. Nasal washings and aspirates are unacceptable for Xpert Xpress SARS-CoV-2/FLU/RSV testing.  Fact Sheet for Patients: EntrepreneurPulse.com.au  Fact Sheet for Healthcare Providers: IncredibleEmployment.be  This test is not yet approved or cleared by the [place] FDA and has been authorized for detection and/or diagnosis of SARS-CoV-2 by FDA under an Emergency Use Authorization (EUA). This EUA will remain in effect (meaning this test can be used) for the duration of the COVID-19 declaration under Section 564(b)(1) of the Act, 21 U.S.C. section  360bbb-3(b)(1), unless the authorization is terminated or revoked.  Performed at [facility], [place] [address], [place], [place] [address]   Surgical pcr screen     Status: None   Collection Time: 07/22/20  9:46 AM   Specimen: Nasal Mucosa; Nasal Swab  Result Value Ref Range Status   MRSA, PCR NEGATIVE NEGATIVE Final   Staphylococcus aureus NEGATIVE NEGATIVE Final    Comment: (NOTE) The Xpert SA Assay (FDA approved for NASAL specimens in patients 45 years of age and older), is one component of a comprehensive surveillance program. It is not intended to diagnose infection nor to guide or monitor treatment. Performed at [facility], [place] [address], [place], [place] [address]      Studies: [place] EKG SITE RITE  Result Date: 07/22/2020 If Site Rite image not attached, placement could not be confirmed due to current cardiac rhythm.     Flora Lipps, MD  [facility] 07/24/2020  If 7PM-7AM, please contact night-coverage

## 2020-07-24 NOTE — Progress Notes (Signed)
PHARMACY - TOTAL PARENTERAL NUTRITION CONSULT NOTE   Indication: Bowel obstruction  Patient Measurements: Height: 5' (152.4 cm) Weight: 55.9 kg (123 lb 3.8 oz) IBW/kg (Calculated) : 45.5 TPN AdjBW (KG): 49.4 Body mass index is 24.07 kg/m. Usual Weight:   Assessment:  Theresa Arnold is an 87 yo female with PMH diverticulitis (s/p Hartmann procedure 03/22/19 with takedown on 09/04/19), HLD, COPD who presented to the hospital with abdominal pain and nausea/vomiting.  She had reported that she had not had any food for approximately 1 week prior to admission and was only drinking Gatorade and water. She also reported that she had no flatus or bowel movements also within 1 week.  Glucose / Insulin: ranged 137- 198(CBG goal < 150), 2 units in past 24 hours  Electrolytes: K+ 3.1 low, pt received 4 of KCl runs, Phos down to 1.0, unclear whey such a drastic drop after had been trending up, CorrCa is nomal at 9.3, Magnesium 1.9, others WNL  Renal: WNL  LFTs / TGs: WNL/ 83 ( 1/11)  Prealbumin / albumin: 6.7 low, / albumin 2.3 low    Intake / Output; MIVF: -956, LR discontinued by TRH on 1/11  GI Imaging: - 1/8 CT of abdomen shows mechanical small bowel obstruction with transition point in the right lower quadrant. Underlying adhesive disease is suspected. - 1/10 persistent SBO   Surgeries / Procedures:  - 1/10 to OR  for exploratory laparotomy   Central access: will obtain PICC after procedure on 1/10 per consult - PICC not obtained on 1/10, line finally placed 1/11  TPN started at 0939 on 1/11   TPN start date:  1/11  Nutritional Goals:  (per RD recommendation on 1/10):  Estimated Nutritional Needs:  Kcal:  1600-1800 Protein:  70-85g Fluid:  1.8L/day  Goal TPN rate is 70 mL/hr (provides 75.6  g of protein and 1777 kcals per day)  - Today   Potassium phosphate 30 mmol ordered by MD  Magnesium 2 gr IV x1   Current Nutrition:  NPO  Plan:   Increase TPN at 50 mL/hr at 1800    Electrolytes in TPN: 70mEq/L of Na, increase to 21mEq/L of K, 38mEq/L of Ca, increase to 8 mEq/L of Mg, and increasing to 20 mmol/L of Phos. Cl:Ac ratio 1:1  Add standard MVI and trace elements to TPN  Continue Very Sensitive q6h SSI. May need to increase as titrating up   MD DC'ed maintenance fluids   CMP, magnesium, phosphorus with AM labs ordered through 1/15   Monitor TPN labs on Mon/Thurs    Royetta Asal, PharmD, BCPS 07/24/2020 11:09 AM

## 2020-07-24 NOTE — Evaluation (Signed)
Occupational Therapy Evaluation Patient Details Name: Theresa Arnold MRN: [id number] DOB: Mar 25, 1932 Today's Date: 07/24/2020    History of Present Illness 87 y.o. female with medical history significant for diverticulitis which required colostomy in past (colostomy has been reversed) who presents for evaluation of abdominal pain with persistent nausea and vomiting. Dx of SBO. s/p exploratory laparoscopy with LOA on 07/22/20.   Clinical Impression   Pt admitted with the above diagnosis and has the deficits listed below. Pt would benefit from cont OT to increase independence with basic adls and adl transfers so she can d/c home safely with her husband. Pt not currently safe for dc home as she is not safe on her feet and requires a great amount of assist that I am unsure the husband can handle. Feel pt will progress quickly and can hopefully d/c with HH only. Will continue OT with focus on adls in standing.     Follow Up Recommendations  Home health OT;Supervision/Assistance - 24 hour    Equipment Recommendations  3 in 1 bedside commode    Recommendations for Other Services       Precautions / Restrictions Precautions Precautions: Fall;Other (comment) Precaution Comments: abdominal surgery, NG tube Restrictions Weight Bearing Restrictions: No      Mobility Bed Mobility Overal bed mobility: Needs Assistance Bed Mobility: Rolling;Sidelying to Sit Rolling: Min assist Sidelying to sit: Min assist       General bed mobility comments: VCs for log roll, min A to initiate roll, min A to raise trunk    Transfers Overall transfer level: Needs assistance Equipment used: Rolling walker (2 wheeled) Transfers: Sit to/from [facility] Sit to Stand: Min assist;From elevated surface Stand pivot transfers: Mod assist       General transfer comment: VCs hand placement, min A to power up, uncontrolled descent to recliner    Balance Overall balance assessment: Needs  assistance   Sitting balance-Leahy Scale: Good     Standing balance support: Bilateral upper extremity supported;During functional activity Standing balance-Leahy Scale: Poor Standing balance comment: Pt was heavily reliant on outside assist today to remain in standing.                           ADL either performed or assessed with clinical judgement   ADL Overall ADL's : Needs assistance/impaired Eating/Feeding: NPO Eating/Feeding Details (indicate cue type and reason): NG tube Grooming: Oral care;Wash/dry face;Wash/dry hands;Set up;Sitting   Upper Body Bathing: Set up;Sitting   Lower Body Bathing: Moderate assistance;Sit to/from stand;Cueing for compensatory techniques Lower Body Bathing Details (indicate cue type and reason): difficulty reaching lower legs and feet due to pain Upper Body Dressing : Minimal assistance;Sitting   Lower Body Dressing: Moderate assistance;Cueing for compensatory techniques;Sit to/from stand   Toilet Transfer: Minimal assistance;BSC;Stand-pivot Armed forces technical officer Details (indicate cue type and reason): Pt shuffling feet requiring cues to shift weight forward and pick up her feet. Toileting- Water quality scientist and Hygiene: Sit to/from stand;Moderate assistance;Cueing for compensatory techniques Toileting - Clothing Manipulation Details (indicate cue type and reason): assist in standing due to decreased balance.     Functional mobility during ADLs: Moderate assistance;Rolling walker General ADL Comments: Pt with diffuculty getting to LEs due to pain in abdomen and required a great amount of assist when on her feet due to poor balance with posterior lean when up.     Vision Baseline Vision/History: No visual deficits Patient Visual Report: No change from baseline Vision Assessment?: No  apparent visual deficits Additional Comments: baseline     Perception Perception Perception Tested?: No   Praxis      Pertinent Vitals/Pain Pain  Assessment: Faces Faces Pain Scale: Hurts even more Pain Location: abdomen Pain Descriptors / Indicators: Sore;Grimacing;Guarding Pain Intervention(s): Limited activity within patient's tolerance;Monitored during session;Patient requesting pain meds-RN notified;Repositioned     Hand Dominance Right   Extremity/Trunk Assessment Upper Extremity Assessment Upper Extremity Assessment: Overall WFL for tasks assessed   Lower Extremity Assessment Lower Extremity Assessment: Defer to PT evaluation   Cervical / Trunk Assessment Cervical / Trunk Assessment: Normal   Communication Communication Communication: No difficulties   Cognition Arousal/Alertness: Awake/alert Behavior During Therapy: WFL for tasks assessed/performed Overall Cognitive Status: Within Functional Limits for tasks assessed                                     General Comments  Pt limited today with adls. Pt with decreased balance and difficulty on feet as well as with LE adls. Pt with short activity tolerance.    Exercises     Shoulder Instructions      Home Living Family/patient expects to be discharged to:: Private residence Living Arrangements: Spouse/significant other Available Help at Discharge: Family;Available 24 hours/day Type of Home: House Home Access: Stairs to enter [employer] of Steps: 12 -has stair lift   Home Layout: One level     Bathroom Shower/Tub: Occupational psychologist: Standard     Home Equipment: Environmental consultant - 2 wheels;Shower seat          Prior Functioning/Environment Level of Independence: Independent        Comments: very independent        OT Problem List: Decreased activity tolerance;Impaired balance (sitting and/or standing);Decreased knowledge of use of DME or AE;Pain      OT Treatment/Interventions: Self-care/ADL training;Therapeutic activities;Balance training    OT Goals(Current goals can be found in the care plan section)  Acute Rehab OT Goals Patient Stated Goal: return to independence with mobility OT Goal Formulation: With patient Time For Goal Achievement: 08/07/20 Potential to Achieve Goals: Good ADL Goals Pt Will Perform Grooming: with supervision;standing Pt Will Perform Lower Body Bathing: with supervision;sit to/from stand Pt Will Perform Lower Body Dressing: with supervision;sit to/from stand Additional ADL Goal #1: Pt will walk to bathroom with walker and complete all toileting with supervision.  OT Frequency: Min 2X/week   Barriers to D/C:    husband home to assist some       Co-evaluation              AM-PAC OT "6 Clicks" Daily Activity     Outcome Measure Help from another person eating meals?: Total Help from another person taking care of personal grooming?: A Little Help from another person toileting, which includes using toliet, bedpan, or urinal?: A Little Help from another person bathing (including washing, rinsing, drying)?: A Lot Help from another person to put on and taking off regular upper body clothing?: A Little Help from another person to put on and taking off regular lower body clothing?: A Lot 6 Click Score: 14   End of Session Equipment Utilized During Treatment: Rolling walker Nurse Communication: Mobility status;Patient requests pain meds  Activity Tolerance: Patient limited by fatigue Patient left: in chair;with call bell/phone within reach;with chair alarm set  OT Visit Diagnosis: Unsteadiness on feet (R26.81);Other abnormalities of gait and mobility (  R26.89);Pain Pain - part of body:  (abdomnal pain)                Time: 0867-6195 OT Time Calculation (min): 20 min Charges:  OT General Charges $OT Visit: 1 Visit OT Evaluation $OT Eval Moderate Complexity: 1 Mod  Glenford Peers 07/24/2020, 9:31 AM

## 2020-07-24 NOTE — Progress Notes (Signed)
CRITICAL VALUE STICKER  CRITICAL VALUE: Phosphorus 1.0  RECEIVER (on-site recipient of call): Estill Bamberg RN  [place] NOTIFIED: [phone]  MESSENGER (representative from lab): Vanessa Ralphs A  MD NOTIFIED: [place], NP  TIME OF NOTIFICATION: 0423  RESPONSE: no new orders received

## 2020-07-25 DIAGNOSIS — K5730 Diverticulosis of large intestine without perforation or abscess without bleeding: Secondary | ICD-10-CM | POA: Diagnosis not present

## 2020-07-25 DIAGNOSIS — J449 Chronic obstructive pulmonary disease, unspecified: Secondary | ICD-10-CM | POA: Diagnosis not present

## 2020-07-25 DIAGNOSIS — K56609 Unspecified intestinal obstruction, unspecified as to partial versus complete obstruction: Secondary | ICD-10-CM | POA: Diagnosis not present

## 2020-07-25 DIAGNOSIS — E876 Hypokalemia: Secondary | ICD-10-CM | POA: Diagnosis not present

## 2020-07-25 LAB — COMPREHENSIVE METABOLIC PANEL
ALT: 12 U/L (ref 0–44)
AST: 26 U/L (ref 15–41)
Albumin: 2.2 g/dL — ABNORMAL LOW (ref 3.5–5.0)
Alkaline Phosphatase: 26 U/L — ABNORMAL LOW (ref 38–126)
Anion gap: 6 (ref 5–15)
BUN: 13 mg/dL (ref 8–23)
CO2: 28 mmol/L (ref 22–32)
Calcium: 7.4 mg/dL — ABNORMAL LOW (ref 8.9–10.3)
Chloride: 102 mmol/L (ref 98–111)
Creatinine, Ser: 0.36 mg/dL — ABNORMAL LOW (ref 0.44–1.00)
GFR, Estimated: >60 mL/min (ref >60)
Glucose, Bld: 156 mg/dL — ABNORMAL HIGH (ref 70–99)
Potassium: 3.2 mmol/L — ABNORMAL LOW (ref 3.5–5.1)
Sodium: 136 mmol/L (ref 135–145)
Total Bilirubin: 0.5 mg/dL (ref 0.3–1.2)
Total Protein: 4.7 g/dL — ABNORMAL LOW (ref 6.5–8.1)

## 2020-07-25 LAB — CBC WITH DIFFERENTIAL/PLATELET
Abs Immature Granulocytes: 0.06 10*3/uL (ref 0.00–0.07)
Basophils Absolute: 0 10*3/uL (ref 0.0–0.1)
Basophils Relative: 0 %
Eosinophils Absolute: 0.1 10*3/uL (ref 0.0–0.5)
Eosinophils Relative: 1 %
HCT: 30.9 % — ABNORMAL LOW (ref 36.0–46.0)
Hemoglobin: 10.4 g/dL — ABNORMAL LOW (ref 12.0–15.0)
Immature Granulocytes: 1 %
Lymphocytes Relative: 21 %
Lymphs Abs: 1.7 10*3/uL (ref 0.7–4.0)
MCH: 36.6 pg — ABNORMAL HIGH (ref 26.0–34.0)
MCHC: 33.7 g/dL (ref 30.0–36.0)
MCV: 108.8 fL — ABNORMAL HIGH (ref 80.0–100.0)
Monocytes Absolute: 0.5 10*3/uL (ref 0.1–1.0)
Monocytes Relative: 6 %
Neutro Abs: 5.8 10*3/uL (ref 1.7–7.7)
Neutrophils Relative %: 71 %
Platelets: 149 10*3/uL — ABNORMAL LOW (ref 150–400)
RBC: 2.84 MIL/uL — ABNORMAL LOW (ref 3.87–5.11)
RDW: 13.5 % (ref 11.5–15.5)
WBC: 8.1 10*3/uL (ref 4.0–10.5)
nRBC: 0 % (ref 0.0–0.2)

## 2020-07-25 LAB — PHOSPHORUS: Phosphorus: 1.9 mg/dL — ABNORMAL LOW (ref 2.5–4.6)

## 2020-07-25 LAB — GLUCOSE, CAPILLARY
Glucose-Capillary: 129 mg/dL — ABNORMAL HIGH (ref 70–99)
Glucose-Capillary: 137 mg/dL — ABNORMAL HIGH (ref 70–99)
Glucose-Capillary: 137 mg/dL — ABNORMAL HIGH (ref 70–99)
Glucose-Capillary: 173 mg/dL — ABNORMAL HIGH (ref 70–99)

## 2020-07-25 LAB — MAGNESIUM: Magnesium: 2.1 mg/dL (ref 1.7–2.4)

## 2020-07-25 MED ORDER — METHOCARBAMOL 1000 MG/10ML IJ SOLN
1000.0000 mg | Freq: Four times a day (QID) | INTRAVENOUS | Status: DC
  Administered 2020-07-25 – 2020-08-02 (×30): 1000 mg via INTRAVENOUS
  Filled 2020-07-25 (×3): qty 1000
  Filled 2020-07-25: qty 10
  Filled 2020-07-25 (×3): qty 1000
  Filled 2020-07-25: qty 10
  Filled 2020-07-25 (×5): qty 1000
  Filled 2020-07-25: qty 10
  Filled 2020-07-25 (×18): qty 1000

## 2020-07-25 MED ORDER — TRACE MINERALS CU-MN-SE-ZN 300-55-60-3000 MCG/ML IV SOLN: INTRAVENOUS | Status: DC

## 2020-07-25 MED ORDER — POTASSIUM CHLORIDE 10 MEQ/50ML IV SOLN
10.0000 meq | INTRAVENOUS | Status: AC
Start: 2020-07-25 — End: 2020-07-25
  Administered 2020-07-25 (×2): 10 meq via INTRAVENOUS
  Filled 2020-07-25 (×2): qty 50

## 2020-07-25 MED ORDER — TRACE MINERALS CU-MN-SE-ZN 300-55-60-3000 MCG/ML IV SOLN
INTRAVENOUS | Status: AC
  Filled 2020-07-25: qty 360

## 2020-07-25 MED ORDER — POTASSIUM PHOSPHATES 15 MMOLE/5ML IV SOLN
30.0000 mmol | Freq: Once | INTRAVENOUS | Status: AC
  Administered 2020-07-25: 10:00:00 30 mmol via INTRAVENOUS
  Filled 2020-07-25: qty 10

## 2020-07-25 NOTE — Progress Notes (Signed)
PHARMACY - TOTAL PARENTERAL NUTRITION CONSULT NOTE   Indication: Bowel obstruction  Patient Measurements: Height: 5' (152.4 cm) Weight: 55.9 kg (123 lb 3.8 oz) IBW/kg (Calculated) : 45.5 TPN AdjBW (KG): 49.4 Body mass index is 24.07 kg/m.  Assessment:  Theresa Arnold is an 87 yo female with PMH diverticulitis (s/p Hartmann procedure 03/22/19 with takedown on 09/04/19), HLD, COPD who presented to the hospital with abdominal pain and nausea/vomiting.  She had reported that she had not had any food for approximately 1 week prior to admission and was only drinking Gatorade and water. She also reported that she had no flatus or bowel movements also within 1 week.  Glucose / Insulin: ranged 137- 162 (CBG goal < 150), 1 unit in past 24 hours  Electrolytes: Na trending down, K+ 3.2 low, Phos 1.9 low and MD replaced with Kphos 41mmol this am, CorrCa is nomal at 8.84, others WNL  Renal: WNL  LFTs / TGs: WNL/ 83 ( 1/11)  Prealbumin / albumin: 6.7 low, / albumin 2.2 low    Intake / Output; MIVF: -228.3, LR discontinued by TRH on 1/11  GI Imaging: - 1/8 CT of abdomen shows mechanical small bowel obstruction with transition point in the right lower quadrant. Underlying adhesive disease is suspected. - 1/10 persistent SBO   Surgeries / Procedures:  - 1/10 to OR  for exploratory laparotomy   Central access: will obtain PICC after procedure on 1/10 per consult - PICC not obtained on 1/10, line finally placed 1/11  TPN started at 0939 on 1/11   TPN start date:  1/11  Nutritional Goals:  (per RD recommendation on 1/10):  Estimated Nutritional Needs:  Kcal:  1600-1800 Protein:  70-85g Fluid:  1.8L/day  Goal TPN rate is 70 mL/hr (provides 75.6  g of protein and 1777 kcals per day)  - Today  Potassium phosphate 30 mmol ordered by MD Planning to clamp NGT today and try sips of clears     Current Nutrition:  NPO, ice chips  Plan:   KCl 10 mEq IV every 1 hour x 2  Continue TPN at 50 mL/hr  at 1800   Electrolytes in TPN: increase to 32mEq/L of Na, increase to 70 mEq/L of K, 35mEq/L of Ca, 8 mEq/L of Mg, and 20 mmol/L of Phos. Cl:Ac ratio 1:1  Add thiamine 100 mg/L and chromium   Add standard MVI and trace elements to TPN  Continue Very Sensitive q6h SSI. May need to increase as titrating up   MD DC'ed maintenance fluids   CMP, magnesium, phosphorus with AM labs ordered through 1/15   Monitor TPN labs on Mon/Thurs   Kahmya Pinkham P. Legrand Como, PharmD, [place] Please utilize Amion for appropriate phone number to reach the unit pharmacist ([place]) 07/25/2020 9:38 AM

## 2020-07-25 NOTE — Progress Notes (Addendum)
PROGRESS NOTE  Theresa Arnold [id number] DOB: [birth year] DOA: 07/19/2020 PCP: Biagio Borg, MD   LOS: 5 days   Brief narrative:  Theresa Arnold is an 87 yo female with PMH diverticulitis (s/p Hartmann procedure 03/22/19 with takedown on 09/04/19), hyperlipidemia, COPD presented to hospital with complaints of abdominal pain nausea and vomiting. NG tube attempt was made in the ER however unsuccessful and IR guided placement was ordered.  CT scan of the abdomen pelvis showed small bowel obstruction so general surgery was consulted.  Patient had minimal improvement with bowel rest, NGT placement and was taken for exploratory laparotomy on 07/22/2020.  She underwent lysis of adhesions and tolerated procedure well.  Due to her prolonged n.p.o. intake while awaiting return of bowel function, she was considered for TPN and PICC line placement due to expected postop ileus.  Assessment/Plan:  Principal Problem:   SBO (small bowel obstruction) (HCC) Active Problems:   Diverticulosis of colon   COPD (chronic obstructive pulmonary disease) (HCC)   Thrombocytopenia (HCC)   Hypokalemia   Macrocytosis without anemia  Small bowel obstruction. CT scan of the abdomen and pelvis showed small bowel obstruction with transition point in the right lower quadrant.  Patient underwent exploratory laparotomy on 07/22/20 with lysis of adhesions.  Patient does have a history of 03/2019 and 08/2019 for Hartmann's and reversal.    Status post PICC line placement and TPN since 07/23/2020.  Follow surgical recommendations.  The tube has been clamped and patient has been started on clears starting today.  She has not had a bowel movement but has no nausea or vomiting.  Macrocytosis without anemia -B12, folate (both adequate).  Could well have vitamin B12 deficiency.  Could consider vitamin B12 on discharge   Thrombocytopenia  -Improved at this time.  Latest platelet count of 149.  Continue to monitor for  bleeding.  Hypokalemia Continue to replenish aggressively.  We will repeat IV potassium phosphate.  Check levels in a.m. on TPN with electrolytes as well.  Hypophosphatemia Will replenish with K-Phos again today.  Phosphorus 1.9 today.  COPD Continue nebulizers and inhalers.  Not in acute exacerbation.  History of diverticulosis of colon - s/p hx of Hartmann's procedure for obstructed diverticulitis  - now s/p LOA on 07/22/20   DVT prophylaxis: enoxaparin (LOVENOX) injection 40 mg Start: 07/23/20 1000   Code Status: Full code  Family Communication:  I spoke with the patient's son on the phone today and updated him about the clinical condition of the patient.  Status is: Inpatient  Remains inpatient appropriate because:IV treatments appropriate due to intensity of illness or inability to take PO, Inpatient level of care appropriate due to severity of illness and Ongoing NPO, electrolyte imbalance, on TPN,    Dispo: The patient is from: Home              Anticipated d/c is to: Home              Anticipated d/c date is: 3 days or more              Patient currently is not medically stable to d/c.   Consultants:  General surgery  Procedures:  Exploratory laparotomy with lysis of adhesions on 07/22/2020.  PICC line placement on 07/23/20  Anti-infectives:  . None  Anti-infectives (From admission, onward)   Start     Dose/Rate Route Frequency Ordered Stop   07/22/20 0915  ceFAZolin (ANCEF) IVPB 2g/100 mL premix  2 g 200 mL/hr over 30 Minutes Intravenous On call to O.R. 07/22/20 0908 07/22/20 1204      Subjective: Today, patient was seen and examined at bedside. No nausea, vomiting, but has mild abdominal pain. Has mild sore throat from NG tube.  Objective: Vitals:   07/24/20 2235 07/25/20 0624  BP: 115/76 129/61  Pulse: 77 74  Resp: 14 16  Temp: 98.6 F (37 C) 98.9 F (37.2 C)  SpO2: 97% 96%    Intake/Output Summary (Last 24 hours) at 07/25/2020  1128 Last data filed at 07/25/2020 0600 Gross per 24 hour  Intake 401.62 ml  Output 750 ml  Net -348.38 ml   Filed Weights   07/19/20 1511 07/22/20 1043  Weight: 61.2 kg 55.9 kg   Body mass index is 24.07 kg/m.   Physical Exam: GENERAL: Patient is alert awake and communicative,. Not in obvious distress.,  Thinly built, NG tube clamped HENT: No scleral pallor or icterus. Pupils equally reactive to light. Oral mucosa is moist.  Nasogastric tube clamped NECK: is supple, no gross swelling noted. CHEST: No crackles or wheezes.  Diminished breath sounds bilaterally. CVS: S1 and S2 heard, no murmur. Regular rate and rhythm.  ABDOMEN: Abdominal incision intact, staples in place.  Hypoactive bowel sounds. EXTREMITIES: No edema.  Left upper extremity PICC line in place  CNS: Cranial nerves are intact. No focal motor deficits. SKIN: warm and dry without rashes.  Data Review: I have personally reviewed the following laboratory data and studies,  CBC: Recent Labs  Lab 07/21/20 0550 07/22/20 0703 07/23/20 0525 07/24/20 0300 07/25/20 0440  WBC 8.1 6.6 7.6 7.0 8.1  NEUTROABS 5.9 4.6 5.6 5.1 5.8  HGB 13.7 14.1 12.7 11.1* 10.4*  HCT 40.4 43.1 38.5 33.6* 30.9*  MCV 107.4* 111.1* 108.8* 109.8* 108.8*  PLT 130* 147* 158 158 623*   Basic Metabolic Panel: Recent Labs  Lab 07/21/20 0550 07/22/20 0703 07/23/20 0525 07/24/20 0300 07/25/20 0440  NA 144 142 140 139 136  K 3.6 3.1* 3.1* 3.1* 3.2*  CL 105 103 102 102 102  CO2 24 23 24 29 28   GLUCOSE 97 105* 133* 165* 156*  BUN 23 22 28* 18 13  CREATININE 0.51 0.52 0.78 0.37* 0.36*  CALCIUM 8.9 8.6* 7.9* 7.9* 7.4*  MG 2.3 2.0 2.0 1.9 2.1  PHOS 2.9 2.6 3.3 1.0* 1.9*   Liver Function Tests: Recent Labs  Lab 07/19/20 1535 07/23/20 0525 07/24/20 0300 07/25/20 0440  AST 61* 20 22 26   ALT 28 14 12 12   ALKPHOS 56 30* 26* 26*  BILITOT 1.6* 1.2 0.3 0.5  PROT 8.2* 5.3* 4.9* 4.7*  ALBUMIN 4.2 2.6* 2.3* 2.2*   No results for input(s):  LIPASE, AMYLASE in the last 168 hours. No results for input(s): AMMONIA in the last 168 hours. Cardiac Enzymes: No results for input(s): CKTOTAL, CKMB, CKMBINDEX, TROPONINI in the last 168 hours. BNP (last 3 results) No results for input(s): BNP in the last 8760 hours.  ProBNP (last 3 results) No results for input(s): PROBNP in the last 8760 hours.  CBG: Recent Labs  Lab 07/24/20 1136 07/24/20 1633 07/24/20 1800 07/25/20 0021 07/25/20 0622  GLUCAP 130* 179* 162* 137* 137*   Recent Results (from the past 240 hour(s))  Resp Panel by RT-PCR (Flu A&B, Covid) Nasopharyngeal Swab     Status: None   Collection Time: 07/17/20  9:18 AM   Specimen: Nasopharyngeal Swab; Nasopharyngeal(NP) swabs in vial transport medium  Result Value Ref Range Status  SARS Coronavirus 2 by RT PCR NEGATIVE NEGATIVE Final    Comment: (NOTE) SARS-CoV-2 target nucleic acids are NOT DETECTED.  The SARS-CoV-2 RNA is generally detectable in upper respiratory specimens during the acute phase of infection. The lowest concentration of SARS-CoV-2 viral copies this assay can detect is 138 copies/mL. A negative result does not preclude SARS-Cov-2 infection and should not be used as the sole basis for treatment or other patient management decisions. A negative result may occur with  improper specimen collection/handling, submission of specimen other than nasopharyngeal swab, presence of viral mutation(s) within the areas targeted by this assay, and inadequate number of viral copies(<138 copies/mL). A negative result must be combined with clinical observations, patient history, and epidemiological information. The expected result is Negative.  Fact Sheet for Patients:  EntrepreneurPulse.com.au  Fact Sheet for Healthcare Providers:  IncredibleEmployment.be  This test is no t yet approved or cleared by the [place] FDA and  has been authorized for detection and/or  diagnosis of SARS-CoV-2 by FDA under an Emergency Use Authorization (EUA). This EUA will remain  in effect (meaning this test can be used) for the duration of the COVID-19 declaration under Section 564(b)(1) of the Act, 21 U.S.C.section 360bbb-3(b)(1), unless the authorization is terminated  or revoked sooner.       Influenza A by PCR NEGATIVE NEGATIVE Final   Influenza B by PCR NEGATIVE NEGATIVE Final    Comment: (NOTE) The Xpert Xpress SARS-CoV-2/FLU/RSV plus assay is intended as an aid in the diagnosis of influenza from Nasopharyngeal swab specimens and should not be used as a sole basis for treatment. Nasal washings and aspirates are unacceptable for Xpert Xpress SARS-CoV-2/FLU/RSV testing.  Fact Sheet for Patients: EntrepreneurPulse.com.au  Fact Sheet for Healthcare Providers: IncredibleEmployment.be  This test is not yet approved or cleared by the [place] FDA and has been authorized for detection and/or diagnosis of SARS-CoV-2 by FDA under an Emergency Use Authorization (EUA). This EUA will remain in effect (meaning this test can be used) for the duration of the COVID-19 declaration under Section 564(b)(1) of the Act, 21 U.S.C. section 360bbb-3(b)(1), unless the authorization is terminated or revoked.  Performed at [facility], [place] [address], [place], [place] [address]   Resp Panel by RT-PCR (Flu A&B, Covid) Nasopharyngeal Swab     Status: None   Collection Time: 07/20/20  3:05 AM   Specimen: Nasopharyngeal Swab; Nasopharyngeal(NP) swabs in vial transport medium  Result Value Ref Range Status   SARS Coronavirus 2 by RT PCR NEGATIVE NEGATIVE Final    Comment: (NOTE) SARS-CoV-2 target nucleic acids are NOT DETECTED.  The SARS-CoV-2 RNA is generally detectable in upper respiratory specimens during the acute phase of infection. The lowest concentration of SARS-CoV-2 viral copies this assay can detect is 138  copies/mL. A negative result does not preclude SARS-Cov-2 infection and should not be used as the sole basis for treatment or other patient management decisions. A negative result may occur with  improper specimen collection/handling, submission of specimen other than nasopharyngeal swab, presence of viral mutation(s) within the areas targeted by this assay, and inadequate number of viral copies(<138 copies/mL). A negative result must be combined with clinical observations, patient history, and epidemiological information. The expected result is Negative.  Fact Sheet for Patients:  EntrepreneurPulse.com.au  Fact Sheet for Healthcare Providers:  IncredibleEmployment.be  This test is no t yet approved or cleared by the [place] FDA and  has been authorized for detection and/or diagnosis of SARS-CoV-2 by FDA  under an Emergency Use Authorization (EUA). This EUA will remain  in effect (meaning this test can be used) for the duration of the COVID-19 declaration under Section 564(b)(1) of the Act, 21 U.S.C.section 360bbb-3(b)(1), unless the authorization is terminated  or revoked sooner.       Influenza A by PCR NEGATIVE NEGATIVE Final   Influenza B by PCR NEGATIVE NEGATIVE Final    Comment: (NOTE) The Xpert Xpress SARS-CoV-2/FLU/RSV plus assay is intended as an aid in the diagnosis of influenza from Nasopharyngeal swab specimens and should not be used as a sole basis for treatment. Nasal washings and aspirates are unacceptable for Xpert Xpress SARS-CoV-2/FLU/RSV testing.  Fact Sheet for Patients: EntrepreneurPulse.com.au  Fact Sheet for Healthcare Providers: IncredibleEmployment.be  This test is not yet approved or cleared by the [place] FDA and has been authorized for detection and/or diagnosis of SARS-CoV-2 by FDA under an Emergency Use Authorization (EUA). This EUA will remain in effect (meaning  this test can be used) for the duration of the COVID-19 declaration under Section 564(b)(1) of the Act, 21 U.S.C. section 360bbb-3(b)(1), unless the authorization is terminated or revoked.  Performed at [facility], [place] [address], [place], [place] [address]   Surgical pcr screen     Status: None   Collection Time: 07/22/20  9:46 AM   Specimen: Nasal Mucosa; Nasal Swab  Result Value Ref Range Status   MRSA, PCR NEGATIVE NEGATIVE Final   Staphylococcus aureus NEGATIVE NEGATIVE Final    Comment: (NOTE) The Xpert SA Assay (FDA approved for NASAL specimens in patients 29 years of age and older), is one component of a comprehensive surveillance program. It is not intended to diagnose infection nor to guide or monitor treatment. Performed at [facility], [place] [address], [place], [place] [address]      Studies: No results found.   Flora Lipps, MD  [facility] 07/25/2020  If 7PM-7AM, please contact night-coverage

## 2020-07-25 NOTE — Progress Notes (Signed)
PT Cancellation Note  Patient Details Name: Theresa Arnold MRN: [id number] DOB: 1932/01/17   Cancelled Treatment:    Reason Eval/Treat Not Completed: Fatigue/lethargy limiting ability to participate;Pain limiting ability to participate (pt c/o headache, RUE pain, and feeling very fatigued. She declined mobility. Will follow. RN notified of request for pain medication.)  Philomena Doheny PT 07/25/2020  Acute Rehabilitation Services Pager [phone] Office [phone]

## 2020-07-25 NOTE — Progress Notes (Signed)
   Progress Note  3 Days Post-Op  Subjective: Patient reports abdomen is sore this AM. Reports some nausea intermittently but is still passing flatus. Mobilizing well. RN reported some increased distention but abdomen feels soft this AM.   Objective: Vital signs in last 24 hours: Temp:  [97.8 F (36.6 C)-98.9 F (37.2 C)] 98.9 F (37.2 C) (01/13 0624) Pulse Rate:  [74-77] 74 (01/13 0624) Resp:  [14-18] 16 (01/13 0624) BP: (98-129)/(58-76) 129/61 (01/13 0624) SpO2:  [96 %-97 %] 96 % (01/13 0624) Last BM Date: 07/20/20  Intake/Output from previous day: 01/12 0701 - 01/13 0700 In: 521.7 [I.V.:240.2; IV Piggyback:281.5] Out: 750 [Urine:700; Emesis/NG output:50] Intake/Output this shift: No intake/output data recorded.  PE: General: pleasant, WD,elderlyfemale who is laying in bed in NAD Heart: regular, rate, and rhythm.  Lungs: CTAB, no wheezes, rhonchi, or rales noted. Respiratory effort nonlabored Abd: soft,appropriately ttp, ND, BShypoactive,NGT clamped, honeycomb dressing present with some bloody drainage centrally and inferiorly MS: all 4 extremities are symmetrical with no cyanosis, clubbing, or edema. Skin: warm and dry with no masses, lesions, or rashes Neuro: Cranial nerves 2-12 grossly intact, sensation is normal throughout Psych: A&Ox3 with an appropriate affect.   Lab Results:  Recent Labs    07/24/20 0300 07/25/20 0440  WBC 7.0 8.1  HGB 11.1* 10.4*  HCT 33.6* 30.9*  PLT 158 149*   BMET Recent Labs    07/24/20 0300 07/25/20 0440  NA 139 136  K 3.1* 3.2*  CL 102 102  CO2 29 28  GLUCOSE 165* 156*  BUN 18 13  CREATININE 0.37* 0.36*  CALCIUM 7.9* 7.4*   PT/INR No results for input(s): LABPROT, INR in the last 72 hours. CMP     Component Value Date/Time   NA 136 07/25/2020 0440   K 3.2 (L) 07/25/2020 0440   CL 102 07/25/2020 0440   CO2 28 07/25/2020 0440   GLUCOSE 156 (H) 07/25/2020 0440   BUN 13 07/25/2020 0440   CREATININE 0.36 (L)  07/25/2020 0440   CALCIUM 7.4 (L) 07/25/2020 0440   PROT 4.7 (L) 07/25/2020 0440   ALBUMIN 2.2 (L) 07/25/2020 0440   AST 26 07/25/2020 0440   ALT 12 07/25/2020 0440   ALKPHOS 26 (L) 07/25/2020 0440   BILITOT 0.5 07/25/2020 0440   GFRNONAA >60 07/25/2020 0440   GFRAA >60 09/09/2019 0353   Lipase     Component Value Date/Time   LIPASE 22 07/17/2020 0953       Studies/Results: No results found.  Anti-infectives: Anti-infectives (From admission, onward)   Start     Dose/Rate Route Frequency Ordered Stop   07/22/20 0915  ceFAZolin (ANCEF) IVPB 2g/100 mL premix        2 g 200 mL/hr over 30 Minutes Intravenous On call to O.R. 07/22/20 0908 07/22/20 1204       Assessment/Plan COPD HLD Severe protein calorie malnutrition - prealbumin 6.7 1/11, cont TPN  SBO S/p exploratory laparotomy with LOA 07/22/20 Dr. Ninfa Linden - POD#3 - patient passing a little flatus - cont TPN, clamp NGT today and try sips of clears - continue to mobilize - await return in bowel function - monitor labs  FEN: sips of clears, NGT clamp, TPN VTE: lovenox FX:TKWIO pre-op 1/10  LOS: 5 days    Norm Parcel , [facility] Surgery 07/25/2020, 9:08 AM Please see Amion for pager number during day hours 7:00am-4:30pm

## 2020-07-25 NOTE — Progress Notes (Signed)
Pt abdomen noted to be more distended than at beginning if my shift. No complaints of fullness or nausea throughout the night.

## 2020-07-25 NOTE — Progress Notes (Signed)
Pt hadnt urinated since foley removal at 1430. Bladder scan showed 62ml. On call Randol Kern paged, see new orders. Pt urinated 433ml after interventions.

## 2020-07-25 NOTE — Care Management Important Message (Signed)
Important Message  Patient Details IM Letter given to the Patient. Name: Theresa Arnold MRN: [id number] Date of Birth: Oct 24, 1931   Medicare Important Message Given:  Yes     Kerin Salen 07/25/2020, 1:20 PM

## 2020-07-26 ENCOUNTER — Inpatient Hospital Stay (HOSPITAL_COMMUNITY): Payer: Medicare Other

## 2020-07-26 DIAGNOSIS — K5730 Diverticulosis of large intestine without perforation or abscess without bleeding: Secondary | ICD-10-CM | POA: Diagnosis not present

## 2020-07-26 DIAGNOSIS — K56609 Unspecified intestinal obstruction, unspecified as to partial versus complete obstruction: Secondary | ICD-10-CM | POA: Diagnosis not present

## 2020-07-26 DIAGNOSIS — E876 Hypokalemia: Secondary | ICD-10-CM | POA: Diagnosis not present

## 2020-07-26 DIAGNOSIS — J449 Chronic obstructive pulmonary disease, unspecified: Secondary | ICD-10-CM | POA: Diagnosis not present

## 2020-07-26 LAB — COMPREHENSIVE METABOLIC PANEL
ALT: 11 U/L (ref 0–44)
AST: 23 U/L (ref 15–41)
Albumin: 2.3 g/dL — ABNORMAL LOW (ref 3.5–5.0)
Alkaline Phosphatase: 30 U/L — ABNORMAL LOW (ref 38–126)
Anion gap: 10 (ref 5–15)
BUN: 10 mg/dL (ref 8–23)
CO2: 26 mmol/L (ref 22–32)
Calcium: 7.8 mg/dL — ABNORMAL LOW (ref 8.9–10.3)
Chloride: 100 mmol/L (ref 98–111)
Creatinine, Ser: 0.34 mg/dL — ABNORMAL LOW (ref 0.44–1.00)
GFR, Estimated: >60 mL/min (ref >60)
Glucose, Bld: 131 mg/dL — ABNORMAL HIGH (ref 70–99)
Potassium: 4 mmol/L (ref 3.5–5.1)
Sodium: 136 mmol/L (ref 135–145)
Total Bilirubin: 0.1 mg/dL — ABNORMAL LOW (ref 0.3–1.2)
Total Protein: 5.2 g/dL — ABNORMAL LOW (ref 6.5–8.1)

## 2020-07-26 LAB — CBC
HCT: 30.9 % — ABNORMAL LOW (ref 36.0–46.0)
Hemoglobin: 10.3 g/dL — ABNORMAL LOW (ref 12.0–15.0)
MCH: 36.3 pg — ABNORMAL HIGH (ref 26.0–34.0)
MCHC: 33.3 g/dL (ref 30.0–36.0)
MCV: 108.8 fL — ABNORMAL HIGH (ref 80.0–100.0)
Platelets: 176 10*3/uL (ref 150–400)
RBC: 2.84 MIL/uL — ABNORMAL LOW (ref 3.87–5.11)
RDW: 13.8 % (ref 11.5–15.5)
WBC: 6.4 10*3/uL (ref 4.0–10.5)
nRBC: 0 % (ref 0.0–0.2)

## 2020-07-26 LAB — MAGNESIUM: Magnesium: 2.1 mg/dL (ref 1.7–2.4)

## 2020-07-26 LAB — GLUCOSE, CAPILLARY
Glucose-Capillary: 131 mg/dL — ABNORMAL HIGH (ref 70–99)
Glucose-Capillary: 131 mg/dL — ABNORMAL HIGH (ref 70–99)
Glucose-Capillary: 145 mg/dL — ABNORMAL HIGH (ref 70–99)
Glucose-Capillary: 155 mg/dL — ABNORMAL HIGH (ref 70–99)
Glucose-Capillary: 156 mg/dL — ABNORMAL HIGH (ref 70–99)

## 2020-07-26 LAB — PHOSPHORUS: Phosphorus: 2.6 mg/dL (ref 2.5–4.6)

## 2020-07-26 MED ORDER — TRACE MINERALS CU-MN-SE-ZN 300-55-60-3000 MCG/ML IV SOLN
INTRAVENOUS | Status: AC
  Filled 2020-07-26: qty 432

## 2020-07-26 MED ORDER — ACETAMINOPHEN 10 MG/ML IV SOLN
1000.0000 mg | Freq: Four times a day (QID) | INTRAVENOUS | Status: AC
  Administered 2020-07-26 – 2020-07-27 (×4): 1000 mg via INTRAVENOUS
  Filled 2020-07-26 (×4): qty 100

## 2020-07-26 NOTE — Progress Notes (Signed)
Occupational Therapy Treatment Patient Details Name: Theresa Arnold MRN: [id number] DOB: 08/19/1931 Today's Date: 07/26/2020    History of present illness 87 y.o. female with medical history significant for diverticulitis which required colostomy in past (colostomy has been reversed) who presents for evaluation of abdominal pain with persistent nausea and vomiting. Dx of SBO. s/p exploratory laparoscopy with LOA on 07/22/20.   OT comments  Treatment focused on promoting activity, improving functional mobility and participation of ADLs out of bed. Patient mod assist for supine to sit, min guard to stand with RW and elevated bed, and min assist to take steps to recliner. Patient limited by abdominal pain and fear of falling. Patient has declined in regards to self care tasks via scoring of six clicks - however, hope that will improve once abdominal pain improved and NG tube removed. Cont POC as is for now.   Follow Up Recommendations  Home health OT;Supervision/Assistance - 24 hour    Equipment Recommendations  3 in 1 bedside commode    Recommendations for Other Services      Precautions / Restrictions Precautions Precautions: Fall Precaution Comments: abdominal surgery, NG tube Restrictions Weight Bearing Restrictions: No       Mobility Bed Mobility Overal bed mobility: Needs Assistance Bed Mobility: Rolling;Sidelying to Sit Rolling: Mod assist Sidelying to sit: Mod assist       General bed mobility comments: VC's for log roll technique - mod assist to pull patient further onto side and assist with trunk lift off.  Transfers Overall transfer level: Needs assistance Equipment used: Rolling walker (2 wheeled) Transfers: Sit to/from [facility] Sit to Stand: From elevated surface;Min guard Stand pivot transfers: Min assist       General transfer comment: Min guard to stand from elevated bed with RW. Verbal cues for step length, min assist for tactile cues  to improve weight shit and walker management to take steps to recliner.    Balance Overall balance assessment: Needs assistance Sitting-balance support: Feet supported Sitting balance-Leahy Scale: Good     Standing balance support: Bilateral upper extremity supported;During functional activity Standing balance-Leahy Scale: Poor                             ADL either performed or assessed with clinical judgement   ADL Overall ADL's : Needs assistance/impaired     Grooming: Oral care;Set up;Sitting Grooming Details (indicate cue type and reason): Performed oral care seated in recliner after transfer                                     Vision Patient Visual Report: No change from baseline     Perception     Praxis      Cognition Arousal/Alertness: Awake/alert Behavior During Therapy: WFL for tasks assessed/performed Overall Cognitive Status: Within Functional Limits for tasks assessed                                          Exercises     Shoulder Instructions       General Comments      Pertinent Vitals/ Pain       Pain Assessment: 0-10 Pain Score: 8  Pain Location: abdomen Pain Descriptors / Indicators: Sore;Grimacing;Guarding Pain Intervention(s): Premedicated before session;Monitored  during session;Limited activity within patient's tolerance  Home Living                                          Prior Functioning/Environment              Frequency  Min 2X/week        Progress Toward Goals  OT Goals(current goals can now be found in the care plan section)  Progress towards OT goals: Not progressing toward goals - comment (limited by pain and fear)  Acute Rehab OT Goals Patient Stated Goal: return to independence with mobility OT Goal Formulation: With patient Time For Goal Achievement: 08/07/20 Potential to Achieve Goals: [place] Discharge plan remains appropriate     Co-evaluation                 AM-PAC OT "6 Clicks" Daily Activity     Outcome Measure   Help from another person eating meals?: A Little (NPO but demonstrates ability to feed self) Help from another person taking care of personal grooming?: A Little Help from another person toileting, which includes using toliet, bedpan, or urinal?: A Lot Help from another person bathing (including washing, rinsing, drying)?: A Lot Help from another person to put on and taking off regular upper body clothing?: A Little Help from another person to put on and taking off regular lower body clothing?: Total 6 Click Score: 14    End of Session Equipment Utilized During Treatment: Rolling walker  OT Visit Diagnosis: Unsteadiness on feet (R26.81);Other abnormalities of gait and mobility (R26.89);Pain   Activity Tolerance Patient limited by fatigue;Patient limited by pain   Patient Left in chair;with call bell/phone within reach;with chair alarm set   Nurse Communication Mobility status        Time: 2952-8413 OT Time Calculation (min): 16 min  Charges: OT General Charges $OT Visit: 1 Visit OT Treatments $Therapeutic Activity: 8-22 mins  Derl Barrow, OTR/L [place]  Office [phone] Pager: [place] 07/26/2020, 10:01 AM

## 2020-07-26 NOTE — Progress Notes (Signed)
PROGRESS NOTE  Theresa Arnold [id number] DOB: [birth year] DOA: 07/19/2020 PCP: Biagio Borg, MD   LOS: 6 days   Brief narrative:  Theresa Arnold is an 87 yo female with PMH diverticulitis (s/p Hartmann procedure 03/22/19 with takedown on 09/04/19), hyperlipidemia, COPD presented to hospital with complaints of abdominal pain nausea and vomiting. NG tube attempt was made in the ER however unsuccessful and IR guided placement was ordered.  CT scan of the abdomen pelvis showed small bowel obstruction so general surgery was consulted.  Patient had minimal improvement with bowel rest, NGT placement and was taken for exploratory laparotomy on 07/22/2020.  She underwent lysis of adhesions and tolerated procedure well.  Due to her prolonged n.p.o. intake while awaiting return of bowel function, she was considered for TPN and PICC line placement due to expected postop ileus.  Assessment/Plan:  Principal Problem:   SBO (small bowel obstruction) (HCC) Active Problems:   Diverticulosis of colon   COPD (chronic obstructive pulmonary disease) (HCC)   Thrombocytopenia (HCC)   Hypokalemia   Macrocytosis without anemia  Small bowel obstruction. CT scan of the abdomen and pelvis showed small bowel obstruction with transition point in the right lower quadrant.  Patient underwent exploratory laparotomy on 07/22/20 with lysis of adhesions.  Patient does have a history of 03/2019 and 08/2019 for Hartmann's and reversal.    Status post PICC line placement and TPN since 07/23/2020. NG tube was clamped but due to distention nausea has been hooked back to suction.  She has not had a bowel movement  and complains of distention and nausea.  Macrocytosis without anemia -B12, folate (both adequate).  Could well have vitamin B12 deficiency.  Could consider vitamin B12 on discharge   Thrombocytopenia  -Improved at this time.  Latest platelet count of 176.   Hypokalemia/hypophosphatemia Improved with replacement, on  TPN, monitor electrolytes daily  COPD Continue nebulizers and inhalers.  Not in acute exacerbation.  History of diverticulosis of colon - s/p hx of Hartmann's procedure for obstructed diverticulitis  - now s/p LOA on 07/22/20  DVT prophylaxis: enoxaparin (LOVENOX) injection 40 mg Start: 07/23/20 1000   Code Status: Full code  Family Communication:  None today.  Spoke with the patient's son yesterday   Status is: Inpatient  Remains inpatient appropriate because:IV treatments appropriate due to intensity of illness or inability to take PO, Inpatient level of care appropriate due to severity of illness and , electrolyte imbalances, on TPN, NG tube with suctioning, n.p.o, persistent ileus  Dispo: The patient is from: Home              Anticipated d/c is to: Home home health likely as for PT evaluation             Anticipated d/c date is: 2-3 days              Patient currently is not medically stable to d/c.   Consultants:  General surgery  Procedures:  Exploratory laparotomy with lysis of adhesions on 07/22/2020.  PICC line placement on 07/23/20  Anti-infectives:  . None  Anti-infectives (From admission, onward)   Start     Dose/Rate Route Frequency Ordered Stop   07/22/20 0915  ceFAZolin (ANCEF) IVPB 2g/100 mL premix        2 g 200 mL/hr over 30 Minutes Intravenous On call to O.R. 07/22/20 0908 07/22/20 1204      Subjective: Today, patient was seen and examined at bedside.  Patient complains of nausea and feeling  of vomiting but has not vomited.  Complains of intermittent abdominal pain and distention.  Has not had a bowel movement.  Objective: Vitals:   07/25/20 2200 07/26/20 0622  BP: (!) 150/65 109/67  Pulse: 86 80  Resp: 18 18  Temp: 98.3 F (36.8 C) 98.6 F (37 C)  SpO2: 99% 100%    Intake/Output Summary (Last 24 hours) at 07/26/2020 0723 Last data filed at 07/26/2020 0600 Gross per 24 hour  Intake 2806.73 ml  Output 1500 ml  Net 1306.73 ml    Filed Weights   07/19/20 1511 07/22/20 1043  Weight: 61.2 kg 55.9 kg   Body mass index is 24.07 kg/m.   Physical Exam:  General: Thinly built, not in obvious distress, alert awake and communicative HENT:   No scleral pallor or icterus noted. Oral mucosa is moist.  NG tube in place. Chest:  .  Diminished breath sounds bilaterally. No crackles or wheezes.  CVS: S1 &S2 heard. No murmur.  Regular rate and rhythm. Abdomen: Soft, nontender, nondistended.  Abdominal incision intact, staples in place.  Hypoactive bowel sounds. Extremities: No cyanosis, clubbing or edema.  Peripheral pulses are palpable.  Left upper extremity PICC line in place Psych: Alert, awake and oriented, normal mood CNS:  No cranial nerve deficits.  Power equal in all extremities.   Skin: Warm and dry.  No rashes noted.   Data Review: I have personally reviewed the following laboratory data and studies,  CBC: Recent Labs  Lab 07/21/20 0550 07/22/20 0703 07/23/20 0525 07/24/20 0300 07/25/20 0440 07/26/20 0407  WBC 8.1 6.6 7.6 7.0 8.1 6.4  NEUTROABS 5.9 4.6 5.6 5.1 5.8  --   HGB 13.7 14.1 12.7 11.1* 10.4* 10.3*  HCT 40.4 43.1 38.5 33.6* 30.9* 30.9*  MCV 107.4* 111.1* 108.8* 109.8* 108.8* 108.8*  PLT 130* 147* 158 158 149* 254   Basic Metabolic Panel: Recent Labs  Lab 07/22/20 0703 07/23/20 0525 07/24/20 0300 07/25/20 0440 07/26/20 0407  NA 142 140 139 136 136  K 3.1* 3.1* 3.1* 3.2* 4.0  CL 103 102 102 102 100  CO2 23 24 29 28 26   GLUCOSE 105* 133* 165* 156* 131*  BUN 22 28* 18 13 10   CREATININE 0.52 0.78 0.37* 0.36* 0.34*  CALCIUM 8.6* 7.9* 7.9* 7.4* 7.8*  MG 2.0 2.0 1.9 2.1 2.1  PHOS 2.6 3.3 1.0* 1.9* 2.6   Liver Function Tests: Recent Labs  Lab 07/19/20 1535 07/23/20 0525 07/24/20 0300 07/25/20 0440 07/26/20 0407  AST 61* 20 22 26 23   ALT 28 14 12 12 11   ALKPHOS 56 30* 26* 26* 30*  BILITOT 1.6* 1.2 0.3 0.5 0.1*  PROT 8.2* 5.3* 4.9* 4.7* 5.2*  ALBUMIN 4.2 2.6* 2.3* 2.2* 2.3*    No results for input(s): LIPASE, AMYLASE in the last 168 hours. No results for input(s): AMMONIA in the last 168 hours. Cardiac Enzymes: No results for input(s): CKTOTAL, CKMB, CKMBINDEX, TROPONINI in the last 168 hours. BNP (last 3 results) No results for input(s): BNP in the last 8760 hours.  ProBNP (last 3 results) No results for input(s): PROBNP in the last 8760 hours.  CBG: Recent Labs  Lab 07/25/20 0622 07/25/20 1150 07/25/20 1817 07/26/20 0008 07/26/20 0541  GLUCAP 137* 173* 129* 156* 145*   Recent Results (from the past 240 hour(s))  Resp Panel by RT-PCR (Flu A&B, Covid) Nasopharyngeal Swab     Status: None   Collection Time: 07/17/20  9:18 AM   Specimen: Nasopharyngeal Swab; Nasopharyngeal(NP) swabs in  vial transport medium  Result Value Ref Range Status   SARS Coronavirus 2 by RT PCR NEGATIVE NEGATIVE Final    Comment: (NOTE) SARS-CoV-2 target nucleic acids are NOT DETECTED.  The SARS-CoV-2 RNA is generally detectable in upper respiratory specimens during the acute phase of infection. The lowest concentration of SARS-CoV-2 viral copies this assay can detect is 138 copies/mL. A negative result does not preclude SARS-Cov-2 infection and should not be used as the sole basis for treatment or other patient management decisions. A negative result may occur with  improper specimen collection/handling, submission of specimen other than nasopharyngeal swab, presence of viral mutation(s) within the areas targeted by this assay, and inadequate number of viral copies(<138 copies/mL). A negative result must be combined with clinical observations, patient history, and epidemiological information. The expected result is Negative.  Fact Sheet for Patients:  EntrepreneurPulse.com.au  Fact Sheet for Healthcare Providers:  IncredibleEmployment.be  This test is no t yet approved or cleared by the [place] FDA and  has been authorized  for detection and/or diagnosis of SARS-CoV-2 by FDA under an Emergency Use Authorization (EUA). This EUA will remain  in effect (meaning this test can be used) for the duration of the COVID-19 declaration under Section 564(b)(1) of the Act, 21 U.S.C.section 360bbb-3(b)(1), unless the authorization is terminated  or revoked sooner.       Influenza A by PCR NEGATIVE NEGATIVE Final   Influenza B by PCR NEGATIVE NEGATIVE Final    Comment: (NOTE) The Xpert Xpress SARS-CoV-2/FLU/RSV plus assay is intended as an aid in the diagnosis of influenza from Nasopharyngeal swab specimens and should not be used as a sole basis for treatment. Nasal washings and aspirates are unacceptable for Xpert Xpress SARS-CoV-2/FLU/RSV testing.  Fact Sheet for Patients: EntrepreneurPulse.com.au  Fact Sheet for Healthcare Providers: IncredibleEmployment.be  This test is not yet approved or cleared by the [place] FDA and has been authorized for detection and/or diagnosis of SARS-CoV-2 by FDA under an Emergency Use Authorization (EUA). This EUA will remain in effect (meaning this test can be used) for the duration of the COVID-19 declaration under Section 564(b)(1) of the Act, 21 U.S.C. section 360bbb-3(b)(1), unless the authorization is terminated or revoked.  Performed at [facility], [place] [address] [place], [place] [address]   Resp Panel by RT-PCR (Flu A&B, Covid) Nasopharyngeal Swab     Status: None   Collection Time: 07/20/20  3:05 AM   Specimen: Nasopharyngeal Swab; Nasopharyngeal(NP) swabs in vial transport medium  Result Value Ref Range Status   SARS Coronavirus 2 by RT PCR NEGATIVE NEGATIVE Final    Comment: (NOTE) SARS-CoV-2 target nucleic acids are NOT DETECTED.  The SARS-CoV-2 RNA is generally detectable in upper respiratory specimens during the acute phase of infection. The lowest concentration of SARS-CoV-2 viral copies this  assay can detect is 138 copies/mL. A negative result does not preclude SARS-Cov-2 infection and should not be used as the sole basis for treatment or other patient management decisions. A negative result may occur with  improper specimen collection/handling, submission of specimen other than nasopharyngeal swab, presence of viral mutation(s) within the areas targeted by this assay, and inadequate number of viral copies(<138 copies/mL). A negative result must be combined with clinical observations, patient history, and epidemiological information. The expected result is Negative.  Fact Sheet for Patients:  EntrepreneurPulse.com.au  Fact Sheet for Healthcare Providers:  IncredibleEmployment.be  This test is no t yet approved or cleared by the [place] and  has been authorized for detection and/or diagnosis of SARS-CoV-2 by FDA under an Emergency Use Authorization (EUA). This EUA will remain  in effect (meaning this test can be used) for the duration of the COVID-19 declaration under Section 564(b)(1) of the Act, 21 U.S.C.section 360bbb-3(b)(1), unless the authorization is terminated  or revoked sooner.       Influenza A by PCR NEGATIVE NEGATIVE Final   Influenza B by PCR NEGATIVE NEGATIVE Final    Comment: (NOTE) The Xpert Xpress SARS-CoV-2/FLU/RSV plus assay is intended as an aid in the diagnosis of influenza from Nasopharyngeal swab specimens and should not be used as a sole basis for treatment. Nasal washings and aspirates are unacceptable for Xpert Xpress SARS-CoV-2/FLU/RSV testing.  Fact Sheet for Patients: EntrepreneurPulse.com.au  Fact Sheet for Healthcare Providers: IncredibleEmployment.be  This test is not yet approved or cleared by the [place] FDA and has been authorized for detection and/or diagnosis of SARS-CoV-2 by FDA under an Emergency Use Authorization (EUA). This EUA will  remain in effect (meaning this test can be used) for the duration of the COVID-19 declaration under Section 564(b)(1) of the Act, 21 U.S.C. section 360bbb-3(b)(1), unless the authorization is terminated or revoked.  Performed at [facility], [place] [address], [place], [place] [address]   Surgical pcr screen     Status: None   Collection Time: 07/22/20  9:46 AM   Specimen: Nasal Mucosa; Nasal Swab  Result Value Ref Range Status   MRSA, PCR NEGATIVE NEGATIVE Final   Staphylococcus aureus NEGATIVE NEGATIVE Final    Comment: (NOTE) The Xpert SA Assay (FDA approved for NASAL specimens in patients 26 years of age and older), is one component of a comprehensive surveillance program. It is not intended to diagnose infection nor to guide or monitor treatment. Performed at [facility], [place] [address], [place], [place] [address]      Studies: No results found.   Flora Lipps, MD  [facility] 07/26/2020  If 7PM-7AM, please contact night-coverage

## 2020-07-26 NOTE — Progress Notes (Signed)
Physical Therapy Treatment Patient Details Name: Theresa Arnold MRN: [id number] DOB: 1932-01-04 Today's Date: 07/26/2020    History of Present Illness 87 y.o. female with medical history significant for diverticulitis which required colostomy in past (colostomy has been reversed) who presents for evaluation of abdominal pain with persistent nausea and vomiting. Dx of SBO. s/p exploratory laparoscopy with LOA on 07/22/20.    PT Comments    Limited session.  General bed mobility comments: VC's for log roll technique - mod assist to pull patient further onto side and assist with trunk.  Only tolerated sitting EOB x 5 min due to increased c/o nausea. Assisted back to bed and positioned to comfort.   Follow Up Recommendations  Home health PT     Equipment Recommendations  None recommended by PT    Recommendations for Other Services       Precautions / Restrictions Precautions Precautions: Fall Precaution Comments: abdominal surgery, NG tube    Mobility  Bed Mobility Overal bed mobility: Needs Assistance Bed Mobility: Supine to Sit;Sit to Supine   Sidelying to sit: Mod assist Supine to sit: Mod assist;Max assist     General bed mobility comments: VC's for log roll technique - mod assist to pull patient further onto side and assist with trunk.  Only tolerated sitting EOB x 5 min due to increased c/o nausea.  Transfers                 General transfer comment: pt requested back to bed feeling "poorly"  Ambulation/Gait                 Stairs             Wheelchair Mobility    Modified Rankin (Stroke Patients Only)       Balance                                            Cognition Arousal/Alertness: Awake/alert Behavior During Therapy: WFL for tasks assessed/performed Overall Cognitive Status: Within Functional Limits for tasks assessed                                 General Comments: AxO x 3 pleasant but  feeling poorly/weak/nausea      Exercises      General Comments        Pertinent Vitals/Pain Pain Assessment: Faces Faces Pain Scale: Hurts little more Pain Descriptors / Indicators: Sore;Grimacing;Guarding Pain Intervention(s): Monitored during session;Repositioned    Home Living                      Prior Function            PT Goals (current goals can now be found in the care plan section) Progress towards PT goals: Progressing toward goals    Frequency    Min 3X/week      PT Plan      Co-evaluation              AM-PAC PT "6 Clicks" Mobility   Outcome Measure  Help needed turning from your back to your side while in a flat bed without using bedrails?: A Little Help needed moving from lying on your back to sitting on the side of a flat bed without using bedrails?: A Little Help  needed moving to and from a bed to a chair (including a wheelchair)?: A Little Help needed standing up from a chair using your arms (e.g., wheelchair or bedside chair)?: A Little Help needed to walk in hospital room?: A Little Help needed climbing 3-5 steps with a railing? : A Lot 6 Click Score: 17    End of Session   Activity Tolerance: Treatment limited secondary to medical complications (Comment) Patient left: in bed;with call bell/phone within reach;with bed alarm set Nurse Communication: Mobility status;Patient requests pain meds PT Visit Diagnosis: Pain;Difficulty in walking, not elsewhere classified (R26.2)     Time: 1024-1040 PT Time Calculation (min) (ACUTE ONLY): 16 min  Charges:  $Therapeutic Activity: 8-22 mins                     Rica Koyanagi  PTA Acute  Rehabilitation Services Pager      [phone] Office      [phone]

## 2020-07-26 NOTE — Progress Notes (Signed)
PHARMACY - TOTAL PARENTERAL NUTRITION CONSULT NOTE   Indication: Bowel obstruction  Patient Measurements: Height: 5' (152.4 cm) Weight: 55.9 kg (123 lb 3.8 oz) IBW/kg (Calculated) : 45.5 TPN AdjBW (KG): 49.4 Body mass index is 24.07 kg/m.  Assessment:  Theresa Arnold is an 87 yo female with PMH diverticulitis (s/p Hartmann procedure 03/22/19 with takedown on 09/04/19), HLD, COPD who presented to the hospital with abdominal pain and nausea/vomiting.  She had reported that she had not had any food for approximately 1 week prior to admission and was only drinking Gatorade and water. She also reported that she had no flatus or bowel movements also within 1 week.  Glucose / Insulin: range 129- 1732 (CBG goal < 150), 2 units in past 24 hours  Electrolytes: Na trending down, K now 4 - wnl, Phos improved 2.6, CorrCa is nomal at 9.3, others WNL  Renal: WNL  LFTs / TGs: WNL/ 83 ( 1/11)  Prealbumin / albumin: 6.7 low, / albumin 2.2 low    Intake / Output; MIVF: ~ 1.1 L UOP/24 hr, LR discontinued by TRH on 1/11  GI Imaging: - 1/8 CT of abdomen shows mechanical small bowel obstruction with transition point in the right lower quadrant. Underlying adhesive disease is suspected. - 1/10 persistent SBO   Surgeries / Procedures:  - 1/10 to OR  for exploratory laparotomy   Central access: will obtain PICC after procedure on 1/10 per consult - PICC not obtained on 1/10, line finally placed 1/11  TPN started at 0939 on 1/11   TPN start date:  1/11  Nutritional Goals:  (per RD recommendation on 1/10):  Estimated Nutritional Needs:  Kcal:  1600-1800 Protein:  70-85g Fluid:  1.8L/day  Goal TPN rate is 70 mL/hr (provides 75.6  g of protein and 1777 kcals per day)  Current Nutrition:  NPO, ice chips  Plan:   Increase TPN to 60 mL/hr with next bag at 1800   Electrolytes in TPN:  53mEq/L of Na, 70 mEq/L of K, 74mEq/L of Ca, 8 mEq/L of Mg, and 20 mmol/L of Phos. Cl:Ac ratio 1:1  Add thiamine 100  mg/L and chromium   Add standard MVI and trace elements to TPN  Continue Very Sensitive q6h SSI. May need to increase as titrating up   CMP, magnesium, phosphorus with AM labs ordered through 1/15   Monitor TPN labs on Mon/Thurs  Minda Ditto PharmD [facility] [phone] 07/26/2020, 10:10 AM

## 2020-07-26 NOTE — Progress Notes (Signed)
   Progress Note  4 Days Post-Op  Subjective: Patient with more distention this AM and passing less flatus. Mobilized less yesterday secondary to pain.   Objective: Vital signs in last 24 hours: Temp:  [98.3 F (36.8 C)-98.6 F (37 C)] 98.6 F (37 C) (01/14 0622) Pulse Rate:  [80-86] 80 (01/14 0622) Resp:  [18] 18 (01/14 0622) BP: (109-150)/(65-67) 109/67 (01/14 0622) SpO2:  [99 %-100 %] 100 % (01/14 0622) Last BM Date: 07/23/20  Intake/Output from previous day: 01/13 0701 - 01/14 0700 In: 2806.7 [P.O.:240; I.V.:1573.2; IV Piggyback:993.6] Out: 1500 [Urine:1500] Intake/Output this shift: No intake/output data recorded.  PE: General: pleasant, WD,elderlyfemale who is laying in bed in NAD Heart: regular, rate, and rhythm.  Lungs: CTAB, no wheezes, rhonchi, or rales noted. Respiratory effort nonlabored Abd: soft,appropriately ttp, more distended, BShypoactive,NGT clamped, honeycomb dressing present with some bloody drainage centrally and inferiorly MS: all 4 extremities are symmetrical with no cyanosis, clubbing, or edema. Skin: warm and dry with no masses, lesions, or rashes Neuro: Cranial nerves 2-12 grossly intact, sensation is normal throughout Psych: A&Ox3 with an appropriate affect.   Lab Results:  Recent Labs    07/25/20 0440 07/26/20 0407  WBC 8.1 6.4  HGB 10.4* 10.3*  HCT 30.9* 30.9*  PLT 149* 176   BMET Recent Labs    07/25/20 0440 07/26/20 0407  NA 136 136  K 3.2* 4.0  CL 102 100  CO2 28 26  GLUCOSE 156* 131*  BUN 13 10  CREATININE 0.36* 0.34*  CALCIUM 7.4* 7.8*   PT/INR No results for input(s): LABPROT, INR in the last 72 hours. CMP     Component Value Date/Time   NA 136 07/26/2020 0407   K 4.0 07/26/2020 0407   CL 100 07/26/2020 0407   CO2 26 07/26/2020 0407   GLUCOSE 131 (H) 07/26/2020 0407   BUN 10 07/26/2020 0407   CREATININE 0.34 (L) 07/26/2020 0407   CALCIUM 7.8 (L) 07/26/2020 0407   PROT 5.2 (L) 07/26/2020 0407    ALBUMIN 2.3 (L) 07/26/2020 0407   AST 23 07/26/2020 0407   ALT 11 07/26/2020 0407   ALKPHOS 30 (L) 07/26/2020 0407   BILITOT 0.1 (L) 07/26/2020 0407   GFRNONAA >60 07/26/2020 0407   GFRAA >60 09/09/2019 0353   Lipase     Component Value Date/Time   LIPASE 22 07/17/2020 0953       Studies/Results: No results found.  Anti-infectives: Anti-infectives (From admission, onward)   Start     Dose/Rate Route Frequency Ordered Stop   07/22/20 0915  ceFAZolin (ANCEF) IVPB 2g/100 mL premix        2 g 200 mL/hr over 30 Minutes Intravenous On call to O.R. 07/22/20 0908 07/22/20 1204       Assessment/Plan COPD HLD Severe protein calorie malnutrition - prealbumin 6.71/11,cont TPN  SBO S/p exploratory laparotomy with LOA 07/22/20 Dr. Ninfa Linden - POD#4 - more distended and less flatus - reconnect NGT to LIWS - NGT slid back some, check KUB to look at position -continue to mobilize - await return in bowel function - monitor labs  FEN: NGT to LIWS, TPN VTE: lovenox TI:RWERX pre-op 1/10  LOS: 6 days    Norm Parcel , [facility] 07/26/2020, 8:35 AM Please see Amion for pager number during day hours 7:00am-4:30pm

## 2020-07-27 DIAGNOSIS — K5730 Diverticulosis of large intestine without perforation or abscess without bleeding: Secondary | ICD-10-CM | POA: Diagnosis not present

## 2020-07-27 DIAGNOSIS — K56609 Unspecified intestinal obstruction, unspecified as to partial versus complete obstruction: Secondary | ICD-10-CM | POA: Diagnosis not present

## 2020-07-27 DIAGNOSIS — J449 Chronic obstructive pulmonary disease, unspecified: Secondary | ICD-10-CM | POA: Diagnosis not present

## 2020-07-27 DIAGNOSIS — E876 Hypokalemia: Secondary | ICD-10-CM | POA: Diagnosis not present

## 2020-07-27 LAB — COMPREHENSIVE METABOLIC PANEL
ALT: 10 U/L (ref 0–44)
AST: 20 U/L (ref 15–41)
Albumin: 2 g/dL — ABNORMAL LOW (ref 3.5–5.0)
Alkaline Phosphatase: 24 U/L — ABNORMAL LOW (ref 38–126)
Anion gap: 6 (ref 5–15)
BUN: 13 mg/dL (ref 8–23)
CO2: 26 mmol/L (ref 22–32)
Calcium: 7.9 mg/dL — ABNORMAL LOW (ref 8.9–10.3)
Chloride: 104 mmol/L (ref 98–111)
Creatinine, Ser: 0.38 mg/dL — ABNORMAL LOW (ref 0.44–1.00)
GFR, Estimated: >60 mL/min (ref >60)
Glucose, Bld: 144 mg/dL — ABNORMAL HIGH (ref 70–99)
Potassium: 4.6 mmol/L (ref 3.5–5.1)
Sodium: 136 mmol/L (ref 135–145)
Total Bilirubin: 0.2 mg/dL — ABNORMAL LOW (ref 0.3–1.2)
Total Protein: 4.8 g/dL — ABNORMAL LOW (ref 6.5–8.1)

## 2020-07-27 LAB — PHOSPHORUS: Phosphorus: 3.1 mg/dL (ref 2.5–4.6)

## 2020-07-27 LAB — MAGNESIUM: Magnesium: 2.2 mg/dL (ref 1.7–2.4)

## 2020-07-27 LAB — GLUCOSE, CAPILLARY
Glucose-Capillary: 159 mg/dL — ABNORMAL HIGH (ref 70–99)
Glucose-Capillary: 133 mg/dL — ABNORMAL HIGH (ref 70–99)
Glucose-Capillary: 129 mg/dL — ABNORMAL HIGH (ref 70–99)

## 2020-07-27 MED ORDER — TRACE MINERALS CU-MN-SE-ZN 300-55-60-3000 MCG/ML IV SOLN
INTRAVENOUS | Status: AC
  Filled 2020-07-27: qty 504

## 2020-07-27 MED ORDER — PANTOPRAZOLE SODIUM 40 MG IV SOLR
40.0000 mg | Freq: Two times a day (BID) | INTRAVENOUS | Status: DC
  Administered 2020-07-27 – 2020-08-04 (×17): 40 mg via INTRAVENOUS
  Filled 2020-07-27 (×17): qty 40

## 2020-07-27 NOTE — Progress Notes (Signed)
PHARMACY - TOTAL PARENTERAL NUTRITION CONSULT NOTE   Indication: Bowel obstruction  Patient Measurements: Height: 5' (152.4 cm) Weight: 55.9 kg (123 lb 3.8 oz) IBW/kg (Calculated) : 45.5 TPN AdjBW (KG): 49.4 Body mass index is 24.07 kg/m.  Assessment:  Theresa Arnold is an 87 yo female with PMH diverticulitis (s/p Hartmann procedure 03/22/19 with takedown on 09/04/19), HLD, COPD who presented to the hospital with abdominal pain and nausea/vomiting.  She had reported that she had not had any food for approximately 1 week prior to admission and was only drinking Gatorade and water. She also reported that she had no flatus or bowel movements also within 1 week.  Glucose / Insulin: range 131-159 (CBG goal < 150), 2 units in past 24 hours  Electrolytes: WNL Renal: WNL  LFTs / TGs: WNL/ 83 ( 1/11)  Prealbumin / albumin: 6.7 low, / albumin 2 low    Intake / Output; MIVF: ~ 1.1 L UOP/24 hr, LR discontinued by TRH on 1/11  GI Imaging: - 1/8 CT of abdomen shows mechanical small bowel obstruction with transition point in the right lower quadrant. Underlying adhesive disease is suspected. - 1/10 persistent SBO   Surgeries / Procedures:  - 1/10 to OR  for exploratory laparotomy   Central access: will obtain PICC after procedure on 1/10 per consult - PICC not obtained on 1/10, line finally placed 1/11  TPN started at 0939 on 1/11   TPN start date:  1/11  Nutritional Goals:  (per RD recommendation on 1/10):  Estimated Nutritional Needs:  Kcal:  1600-1800 Protein:  70-85g Fluid:  1.8L/day  Goal TPN rate is 70 mL/hr (provides 75.6  g of protein and 1777 kcals per day)  Current Nutrition:  NPO, ice chips  Plan:   Increase TPN to 70 mL/hr with next bag at 1800   Continue with current electroltyes  Electrolytes in TPN:  51mEq/L of Na, 70 mEq/L of K, 72mEq/L of Ca, 8 mEq/L of Mg, and 20 mmol/L of Phos. Cl:Ac ratio 1:1  Add thiamine 100 mg/L and chromium   Add standard MVI and trace  elements to TPN  Continue Very Sensitive q6h SSI. May need to increase as titrating up   CMET, Mg, and phos in AM   Monitor TPN labs on Mon/Thurs  Theresa Arnold  07/27/2020, 9:53 AM

## 2020-07-27 NOTE — Progress Notes (Signed)
     Assessment & Plan: POD#5 - S/p exploratory laparotomy with LOA 07/22/20 Dr. Ninfa Linden - NG decompression - TNA -continue to mobilize, OOB, ambulate - await return in bowel function - monitor labs        [name], MD       [facility]       Office: [phone]   Chief Complaint: SBO  Subjective: Patient in bed.  Not having much flatus, no BM's.  Some pain.  Objective: Vital signs in last 24 hours: Temp:  [98.4 F (36.9 C)-98.6 F (37 C)] 98.4 F (36.9 C) (01/15 0635) Pulse Rate:  [77-80] 78 (01/15 0635) Resp:  [17-18] 18 (01/15 0635) BP: (104-133)/(53-61) 104/53 (01/15 0635) SpO2:  [97 %-100 %] 98 % (01/15 0635) Last BM Date:  (UNSURE)  Intake/Output from previous day: 01/14 0701 - 01/15 0700 In: 2443.9 [P.O.:180; I.V.:1264.3; IV Piggyback:999.6] Out: 2400 [Urine:2150; Emesis/NG output:250] Intake/Output this shift: No intake/output data recorded.  Physical Exam: HEENT - sclerae clear, mucous membranes moist Abdomen - moderate distension; dressing with old blood, no drainage Ext - no edema, non-tender Neuro - alert & oriented, no focal deficits  Lab Results:  Recent Labs    07/25/20 0440 07/26/20 0407  WBC 8.1 6.4  HGB 10.4* 10.3*  HCT 30.9* 30.9*  PLT 149* 176   BMET Recent Labs    07/26/20 0407 07/27/20 0334  NA 136 136  K 4.0 4.6  CL 100 104  CO2 26 26  GLUCOSE 131* 144*  BUN 10 13  CREATININE 0.34* 0.38*  CALCIUM 7.8* 7.9*   PT/INR No results for input(s): LABPROT, INR in the last 72 hours. Comprehensive Metabolic Panel:    Component Value Date/Time   NA 136 07/27/2020 0334   NA 136 07/26/2020 0407   K 4.6 07/27/2020 0334   K 4.0 07/26/2020 0407   CL 104 07/27/2020 0334   CL 100 07/26/2020 0407   CO2 26 07/27/2020 0334   CO2 26 07/26/2020 0407   BUN 13 07/27/2020 0334   BUN 10 07/26/2020 0407   CREATININE 0.38 (L) 07/27/2020 0334   CREATININE 0.34 (L) 07/26/2020 0407   GLUCOSE 144 (H) 07/27/2020 0334    GLUCOSE 131 (H) 07/26/2020 0407   CALCIUM 7.9 (L) 07/27/2020 0334   CALCIUM 7.8 (L) 07/26/2020 0407   AST 20 07/27/2020 0334   AST 23 07/26/2020 0407   ALT 10 07/27/2020 0334   ALT 11 07/26/2020 0407   ALKPHOS 24 (L) 07/27/2020 0334   ALKPHOS 30 (L) 07/26/2020 0407   BILITOT 0.2 (L) 07/27/2020 0334   BILITOT 0.1 (L) 07/26/2020 0407   PROT 4.8 (L) 07/27/2020 0334   PROT 5.2 (L) 07/26/2020 0407   ALBUMIN 2.0 (L) 07/27/2020 0334   ALBUMIN 2.3 (L) 07/26/2020 0407    Studies/Results: DG Abd Portable 1V  Result Date: 07/26/2020 CLINICAL DATA:  Postoperative ileus EXAM: PORTABLE ABDOMEN - 1 VIEW COMPARISON:  07/22/2020 FINDINGS: Nasogastric tube in the fundus of the stomach. Ordinary amount of gas within the small and large bowel. No evidence of frank ileus presently. IMPRESSION: Nasogastric tube in place.  Gas pattern unremarkable. Electronically Signed   By: Nelson Chimes M.D.   On: 07/26/2020 10:26      [name] 2/77/8242  Patient ID: Theresa Arnold, female   DOB: 1932-03-23, 87 y.o.   MRN: [id number]

## 2020-07-27 NOTE — Progress Notes (Signed)
PROGRESS NOTE  Theresa Arnold [id number] DOB: 1932/01/19 DOA: 07/19/2020 PCP: Biagio Borg, MD   LOS: 7 days   Brief narrative:  Theresa Arnold is an 87 yo female with PMH diverticulitis (s/p Hartmann procedure 03/22/19 with takedown on 09/04/19), hyperlipidemia, COPD presented to hospital with complaints of abdominal pain nausea and vomiting. NG tube attempt was made in the ER however unsuccessful and IR guided placement was ordered.  CT scan of the abdomen pelvis showed small bowel obstruction so general surgery was consulted.  Patient had minimal improvement with bowel rest, NGT placement and was taken for exploratory laparotomy on 07/22/2020.  She underwent lysis of adhesions and tolerated procedure well.  Due to her prolonged n.p.o. intake while awaiting return of bowel function, she was considered for TPN and PICC line placement due to expected postop ileus.    Patient was continued on NG tube and to follow the patient during hospitalization. Assessment/Plan:  Principal Problem:   SBO (small bowel obstruction) (HCC) Active Problems:   Diverticulosis of colon   COPD (chronic obstructive pulmonary disease) (HCC)   Thrombocytopenia (HCC)   Hypokalemia   Macrocytosis without anemia  Small bowel obstruction.   Patient underwent exploratory laparotomy on 07/22/20 with lysis of adhesions.  Patient does have a history of Hartmann's and reversal in the past..    Status post PICC line placement and TPN since 07/23/2020. NG tube was clamped but due to distention nausea has been hooked back to suction.  She has not had a bowel movement.  Fluid noted in the NG aspirate.  We will discontinue Lovenox and put the patient on IV Protonix.  Macrocytic anemia.  We will consider vitamin [id number] folic acid on discharge.  Dark-tinged aspirate on NG tube aspirate.  We will put the patient on PPI.  Discontinue Lovenox.  Thrombocytopenia  -Improved at this time.  Latest platelet count of 176.   Benign.  Hypokalemia/hypophosphatemia hypomagnesemia Improved.  Will replace as necessary.  COPD Continue nebulizers and inhalers.  Not in acute exacerbation.  History of diverticulosis of colon - s/p hx of Hartmann's procedure for obstructed diverticulitis  - now s/p LOA on 07/22/20  DVT prophylaxis: Hold Lovenox for now.   Code Status: Full code  Family Communication:  None today.     Status is: Inpatient  Remains inpatient appropriate because:IV treatments appropriate due to intensity of illness or inability to take PO, Inpatient level of care appropriate due to severity of illness, on TPN, NG tube with suctioning, n.p.o, persistent ileus  Dispo: The patient is from: Home              Anticipated d/c is to: Home home health likely as for PT evaluation             Anticipated d/c date is: 2-3 days              Patient currently is not medically stable to d/c.   Consultants:  General surgery  Procedures:  Exploratory laparotomy with lysis of adhesions on 07/22/2020.  PICC line placement on 07/23/20  Anti-infectives:  . None  Anti-infectives (From admission, onward)   Start     Dose/Rate Route Frequency Ordered Stop   07/22/20 0915  ceFAZolin (ANCEF) IVPB 2g/100 mL premix        2 g 200 mL/hr over 30 Minutes Intravenous On call to O.R. 07/22/20 0908 07/22/20 1204      Subjective: Today, patient was seen and examined at bedside.  Patient has not  had a bowel movement.  Denies any nausea vomiting but still has dark aspirator through the NG tube.  No flatus overnight , abdominal discomfort.  Objective: Vitals:   07/26/20 2029 07/27/20 0635  BP: (!) 114/59 (!) 104/53  Pulse: 80 78  Resp: 18 18  Temp: 98.6 F (37 C) 98.4 F (36.9 C)  SpO2: 100% 98%    Intake/Output Summary (Last 24 hours) at 07/27/2020 1140 Last data filed at 07/27/2020 1000 Gross per 24 hour  Intake 2230.52 ml  Output 2650 ml  Net -419.48 ml   Filed Weights   07/19/20 1511 07/22/20  1043  Weight: 61.2 kg 55.9 kg   Body mass index is 24.07 kg/m.   Physical Exam: General: Thinly built, not in obvious distress, alert awake and communicative HENT:   No scleral pallor or icterus noted. Oral mucosa is moist.  NG tube in place with dark aspirate.. Chest:  .  Diminished breath sounds bilaterally. No crackles or wheezes.  CVS: S1 &S2 heard. No murmur.  Regular rate and rhythm. Abdomen: Soft, nontender, mildly distended abdominal incision intact, staples in place.  Bowel sounds heard Extremities: No cyanosis, clubbing or edema.  Peripheral pulses are palpable.  Left upper extremity PICC line in place Psych: Alert, awake and communicative, normal mood CNS:  No cranial nerve deficits.  Power equal in all extremities.   Skin: Warm and dry.  No rashes noted.   Data Review: I have personally reviewed the following laboratory data and studies,  CBC: Recent Labs  Lab 07/21/20 0550 07/22/20 0703 07/23/20 0525 07/24/20 0300 07/25/20 0440 07/26/20 0407  WBC 8.1 6.6 7.6 7.0 8.1 6.4  NEUTROABS 5.9 4.6 5.6 5.1 5.8  --   HGB 13.7 14.1 12.7 11.1* 10.4* 10.3*  HCT 40.4 43.1 38.5 33.6* 30.9* 30.9*  MCV 107.4* 111.1* 108.8* 109.8* 108.8* 108.8*  PLT 130* 147* 158 158 149* 102   Basic Metabolic Panel: Recent Labs  Lab 07/23/20 0525 07/24/20 0300 07/25/20 0440 07/26/20 0407 07/27/20 0334  NA 140 139 136 136 136  K 3.1* 3.1* 3.2* 4.0 4.6  CL 102 102 102 100 104  CO2 24 29 28 26 26   GLUCOSE 133* 165* 156* 131* 144*  BUN 28* 18 13 10 13   CREATININE 0.78 0.37* 0.36* 0.34* 0.38*  CALCIUM 7.9* 7.9* 7.4* 7.8* 7.9*  MG 2.0 1.9 2.1 2.1 2.2  PHOS 3.3 1.0* 1.9* 2.6 3.1   Liver Function Tests: Recent Labs  Lab 07/23/20 0525 07/24/20 0300 07/25/20 0440 07/26/20 0407 07/27/20 0334  AST 20 22 26 23 20   ALT 14 12 12 11 10   ALKPHOS 30* 26* 26* 30* 24*  BILITOT 1.2 0.3 0.5 0.1* 0.2*  PROT 5.3* 4.9* 4.7* 5.2* 4.8*  ALBUMIN 2.6* 2.3* 2.2* 2.3* 2.0*   No results for input(s):  LIPASE, AMYLASE in the last 168 hours. No results for input(s): AMMONIA in the last 168 hours. Cardiac Enzymes: No results for input(s): CKTOTAL, CKMB, CKMBINDEX, TROPONINI in the last 168 hours. BNP (last 3 results) No results for input(s): BNP in the last 8760 hours.  ProBNP (last 3 results) No results for input(s): PROBNP in the last 8760 hours.  CBG: Recent Labs  Lab 07/26/20 1146 07/26/20 1756 07/26/20 2314 07/27/20 0547 07/27/20 1135  GLUCAP 131* 155* 131* 159* 133*   Recent Results (from the past 240 hour(s))  Resp Panel by RT-PCR (Flu A&B, Covid) Nasopharyngeal Swab     Status: None   Collection Time: 07/20/20  3:05 AM  Specimen: Nasopharyngeal Swab; Nasopharyngeal(NP) swabs in vial transport medium  Result Value Ref Range Status   SARS Coronavirus 2 by RT PCR NEGATIVE NEGATIVE Final    Comment: (NOTE) SARS-CoV-2 target nucleic acids are NOT DETECTED.  The SARS-CoV-2 RNA is generally detectable in upper respiratory specimens during the acute phase of infection. The lowest concentration of SARS-CoV-2 viral copies this assay can detect is 138 copies/mL. A negative result does not preclude SARS-Cov-2 infection and should not be used as the sole basis for treatment or other patient management decisions. A negative result may occur with  improper specimen collection/handling, submission of specimen other than nasopharyngeal swab, presence of viral mutation(s) within the areas targeted by this assay, and inadequate number of viral copies(<138 copies/mL). A negative result must be combined with clinical observations, patient history, and epidemiological information. The expected result is Negative.  Fact Sheet for Patients:  EntrepreneurPulse.com.au  Fact Sheet for Healthcare Providers:  IncredibleEmployment.be  This test is no t yet approved or cleared by the [place] FDA and  has been authorized for detection and/or  diagnosis of SARS-CoV-2 by FDA under an Emergency Use Authorization (EUA). This EUA will remain  in effect (meaning this test can be used) for the duration of the COVID-19 declaration under Section 564(b)(1) of the Act, 21 U.S.C.section 360bbb-3(b)(1), unless the authorization is terminated  or revoked sooner.       Influenza A by PCR NEGATIVE NEGATIVE Final   Influenza B by PCR NEGATIVE NEGATIVE Final    Comment: (NOTE) The Xpert Xpress SARS-CoV-2/FLU/RSV plus assay is intended as an aid in the diagnosis of influenza from Nasopharyngeal swab specimens and should not be used as a sole basis for treatment. Nasal washings and aspirates are unacceptable for Xpert Xpress SARS-CoV-2/FLU/RSV testing.  Fact Sheet for Patients: EntrepreneurPulse.com.au  Fact Sheet for Healthcare Providers: IncredibleEmployment.be  This test is not yet approved or cleared by the [place] FDA and has been authorized for detection and/or diagnosis of SARS-CoV-2 by FDA under an Emergency Use Authorization (EUA). This EUA will remain in effect (meaning this test can be used) for the duration of the COVID-19 declaration under Section 564(b)(1) of the Act, 21 U.S.C. section 360bbb-3(b)(1), unless the authorization is terminated or revoked.  Performed at [facility], [place] [address], [place], [place] [address]   Surgical pcr screen     Status: None   Collection Time: 07/22/20  9:46 AM   Specimen: Nasal Mucosa; Nasal Swab  Result Value Ref Range Status   MRSA, PCR NEGATIVE NEGATIVE Final   Staphylococcus aureus NEGATIVE NEGATIVE Final    Comment: (NOTE) The Xpert SA Assay (FDA approved for NASAL specimens in patients 82 years of age and older), is one component of a comprehensive surveillance program. It is not intended to diagnose infection nor to guide or monitor treatment. Performed at [facility], [place] [address], [place], [place] [address]      Studies: DG Abd Portable 1V  Result Date: 07/26/2020 CLINICAL DATA:  Postoperative ileus EXAM: PORTABLE ABDOMEN - 1 VIEW COMPARISON:  07/22/2020 FINDINGS: Nasogastric tube in the fundus of the stomach. Ordinary amount of gas within the small and large bowel. No evidence of frank ileus presently. IMPRESSION: Nasogastric tube in place.  Gas pattern unremarkable. Electronically Signed   By: Nelson Chimes M.D.   On: 07/26/2020 10:26     Flora Lipps, MD  [facility] 07/27/2020  If 7PM-7AM, please contact night-coverage

## 2020-07-28 DIAGNOSIS — E876 Hypokalemia: Secondary | ICD-10-CM | POA: Diagnosis not present

## 2020-07-28 DIAGNOSIS — J449 Chronic obstructive pulmonary disease, unspecified: Secondary | ICD-10-CM | POA: Diagnosis not present

## 2020-07-28 DIAGNOSIS — K56609 Unspecified intestinal obstruction, unspecified as to partial versus complete obstruction: Secondary | ICD-10-CM | POA: Diagnosis not present

## 2020-07-28 DIAGNOSIS — K5730 Diverticulosis of large intestine without perforation or abscess without bleeding: Secondary | ICD-10-CM | POA: Diagnosis not present

## 2020-07-28 LAB — BASIC METABOLIC PANEL
Anion gap: 8 (ref 5–15)
BUN: 10 mg/dL (ref 8–23)
CO2: 26 mmol/L (ref 22–32)
Calcium: 8.4 mg/dL — ABNORMAL LOW (ref 8.9–10.3)
Chloride: 99 mmol/L (ref 98–111)
Creatinine, Ser: 0.4 mg/dL — ABNORMAL LOW (ref 0.44–1.00)
GFR, Estimated: >60 mL/min (ref >60)
Glucose, Bld: 140 mg/dL — ABNORMAL HIGH (ref 70–99)
Potassium: 4.6 mmol/L (ref 3.5–5.1)
Sodium: 133 mmol/L — ABNORMAL LOW (ref 135–145)

## 2020-07-28 LAB — CBC
HCT: 25.8 % — ABNORMAL LOW (ref 36.0–46.0)
Hemoglobin: 8.7 g/dL — ABNORMAL LOW (ref 12.0–15.0)
MCH: 37 pg — ABNORMAL HIGH (ref 26.0–34.0)
MCHC: 33.7 g/dL (ref 30.0–36.0)
MCV: 109.8 fL — ABNORMAL HIGH (ref 80.0–100.0)
Platelets: 212 10*3/uL (ref 150–400)
RBC: 2.35 MIL/uL — ABNORMAL LOW (ref 3.87–5.11)
RDW: 14.4 % (ref 11.5–15.5)
WBC: 9.9 10*3/uL (ref 4.0–10.5)
nRBC: 0 % (ref 0.0–0.2)

## 2020-07-28 LAB — GLUCOSE, CAPILLARY
Glucose-Capillary: 124 mg/dL — ABNORMAL HIGH (ref 70–99)
Glucose-Capillary: 131 mg/dL — ABNORMAL HIGH (ref 70–99)
Glucose-Capillary: 134 mg/dL — ABNORMAL HIGH (ref 70–99)
Glucose-Capillary: 142 mg/dL — ABNORMAL HIGH (ref 70–99)
Glucose-Capillary: 143 mg/dL — ABNORMAL HIGH (ref 70–99)

## 2020-07-28 LAB — MAGNESIUM: Magnesium: 2.1 mg/dL (ref 1.7–2.4)

## 2020-07-28 LAB — PHOSPHORUS: Phosphorus: 4 mg/dL (ref 2.5–4.6)

## 2020-07-28 MED ORDER — INSULIN ASPART 100 UNIT/ML ~~LOC~~ SOLN: 0.0000 [IU] | Freq: Three times a day (TID) | SUBCUTANEOUS | Status: DC

## 2020-07-28 MED ORDER — TRACE MINERALS CU-MN-SE-ZN 300-55-60-3000 MCG/ML IV SOLN
INTRAVENOUS | Status: AC
  Filled 2020-07-28: qty 504

## 2020-07-28 NOTE — Progress Notes (Addendum)
PROGRESS NOTE  Theresa Arnold IZT:[id number] DOB: June 10, 1932 DOA: 07/19/2020 PCP: Biagio Borg, MD   LOS: 8 days   Brief narrative:  Theresa Arnold is an 87 yo female with PMH diverticulitis (s/p Hartmann procedure 03/22/19 with takedown on 09/04/19), hyperlipidemia, COPD presented to hospital with complaints of abdominal pain nausea and vomiting. NG tube attempt was made in the ER however unsuccessful and IR guided placement was ordered.  CT scan of the abdomen pelvis showed small bowel obstruction so general surgery was consulted.  Patient had minimal improvement with bowel rest, NGT placement and was taken for exploratory laparotomy on 07/22/2020.  She underwent lysis of adhesions and tolerated procedure well.  Due to her prolonged n.p.o. intake while awaiting return of bowel function, she was considered for TPN and PICC line placement due to expected postop ileus.    Patient was continued on NG tube and surgery followed the patient during hospitalization due to prolonged ileus  Assessment/Plan:  Principal Problem:   SBO (small bowel obstruction) ([place]) Active Problems:   Diverticulosis of colon   COPD (chronic obstructive pulmonary disease) (HCC)   Thrombocytopenia (HCC)   Hypokalemia   Macrocytosis without anemia  Small bowel obstruction,now with post op ileus   Patient underwent exploratory laparotomy on 07/22/20 with lysis of adhesions. History of Hartmann's and reversal in the past..    Status post PICC line placement and TPN since 07/23/2020. Dark fluid in NG aspirate. Off Lovenox and on IV Protonix. Likely a prolonged course  Macrocytic anemia.  We will consider vitamin J82, folic acid on discharge.  Dark-tinged aspirate on NG tube aspirate. continue PPI.  Discontinue Lovenox.  Thrombocytopenia  Resolved.  Hypokalemia/hypophosphatemia, hypomagnesemia Improved.    COPD Continue nebulizers and inhalers.  Not in acute exacerbation.  History of diverticulosis of colon - s/p hx  of Hartmann's procedure for obstructed diverticulitis  - now s/p LOA on 07/22/20  DVT prophylaxis: Hold Lovenox for now.   Code Status: Full code  Family Communication:  Spoke with the patient's son and updated him about the clinical condition of the patient.   Status is: Inpatient  Remains inpatient appropriate because:IV treatments appropriate due to intensity of illness or inability to take PO, Inpatient level of care appropriate due to severity of illness, on TPN, NG tube with suctioning, n.p.o, persistent ileus  Dispo: The patient is from: Home              Anticipated d/c is to: Home home health likely as for PT evaluation             Anticipated d/c date is: 2-3 days              Patient currently is not medically stable to d/c.   Consultants:  General surgery  Procedures:  Exploratory laparotomy with lysis of adhesions on 07/22/2020.  PICC line placement on 07/23/20  Anti-infectives:  . None  Anti-infectives (From admission, onward)   Start     Dose/Rate Route Frequency Ordered Stop   07/22/20 0915  ceFAZolin (ANCEF) IVPB 2g/100 mL premix        2 g 200 mL/hr over 30 Minutes Intravenous On call to O.R. 07/22/20 0908 07/22/20 1204      Subjective: Today, patient was seen and examined at bedside.  Patient still has no bowel movements.  No nausea or vomiting still has NG tube in place.  Complains of intermittent abdominal pain.   Objective: Vitals:   07/27/20 2126 07/28/20 0542  BP:  119/66 110/63  Pulse: 87 85  Resp: 16 15  Temp: 98.4 F (36.9 C) 98.7 F (37.1 C)  SpO2: 98% 98%    Intake/Output Summary (Last 24 hours) at 07/28/2020 0717 Last data filed at 07/28/2020 [id number] Gross per 24 hour  Intake 1242.69 ml  Output 3350 ml  Net -2107.31 ml   Filed Weights   07/19/20 1511 07/22/20 1043  Weight: 61.2 kg 55.9 kg   Body mass index is 24.07 kg/m.   Physical Exam:  General: thinly built, not in obvious distress HENT:   No scleral pallor or icterus  noted. Oral mucosa is moist.  NG tube in place Chest:  Clear breath sounds.  Diminished breath sounds bilaterally. No crackles or wheezes.  CVS: S1 &S2 heard. No murmur.  Regular rate and rhythm. Abdomen: Soft, mildly distended, hypoactive bowel sounds, abdominal incision intact. Extremities: No cyanosis, clubbing or edema.  Peripheral pulses are palpable. Psych: Alert, awake and oriented, normal mood CNS:  No cranial nerve deficits.  Power equal in all extremities.   Skin: Warm and dry.  No rashes noted.  Data Review: I have personally reviewed the following laboratory data and studies,  CBC: Recent Labs  Lab 07/22/20 0703 07/23/20 0525 07/24/20 0300 07/25/20 0440 07/26/20 0407 07/28/20 0313  WBC 6.6 7.6 7.0 8.1 6.4 9.9  NEUTROABS 4.6 5.6 5.1 5.8  --   --   HGB 14.1 12.7 11.1* 10.4* 10.3* 8.7*  HCT 43.1 38.5 33.6* 30.9* 30.9* 25.8*  MCV 111.1* 108.8* 109.8* 108.8* 108.8* 109.8*  PLT 147* 158 158 149* 176 [id number]   Basic Metabolic Panel: Recent Labs  Lab 07/24/20 0300 07/25/20 0440 07/26/20 0407 07/27/20 0334 07/28/20 0313  NA 139 136 136 136 133*  K 3.1* 3.2* 4.0 4.6 4.6  CL 102 102 100 104 99  CO2 29 28 26 26 26   GLUCOSE 165* 156* 131* 144* 140*  BUN 18 13 10 13 10   CREATININE 0.37* 0.36* 0.34* 0.38* 0.40*  CALCIUM 7.9* 7.4* 7.8* 7.9* 8.4*  MG 1.9 2.1 2.1 2.2 2.1  PHOS 1.0* 1.9* 2.6 3.1 4.0   Liver Function Tests: Recent Labs  Lab 07/23/20 0525 07/24/20 0300 07/25/20 0440 07/26/20 0407 07/27/20 0334  AST 20 22 26 23 20   ALT 14 12 12 11 10   ALKPHOS 30* 26* 26* 30* 24*  BILITOT 1.2 0.3 0.5 0.1* 0.2*  PROT 5.3* 4.9* 4.7* 5.2* 4.8*  ALBUMIN 2.6* 2.3* 2.2* 2.3* 2.0*   No results for input(s): LIPASE, AMYLASE in the last 168 hours. No results for input(s): AMMONIA in the last 168 hours. Cardiac Enzymes: No results for input(s): CKTOTAL, CKMB, CKMBINDEX, TROPONINI in the last 168 hours. BNP (last 3 results) No results for input(s): BNP in the last 8760  hours.  ProBNP (last 3 results) No results for input(s): PROBNP in the last 8760 hours.  CBG: Recent Labs  Lab 07/27/20 0547 07/27/20 1135 07/27/20 1751 07/28/20 0002 07/28/20 0539  GLUCAP 159* 133* 129* 124* 143*   Recent Results (from the past 240 hour(s))  Resp Panel by RT-PCR (Flu A&B, Covid) Nasopharyngeal Swab     Status: None   Collection Time: 07/20/20  3:05 AM   Specimen: Nasopharyngeal Swab; Nasopharyngeal(NP) swabs in vial transport medium  Result Value Ref Range Status   SARS Coronavirus 2 by RT PCR NEGATIVE NEGATIVE Final    Comment: (NOTE) SARS-CoV-2 target nucleic acids are NOT DETECTED.  The SARS-CoV-2 RNA is generally detectable in upper respiratory specimens during the acute phase  of infection. The lowest concentration of SARS-CoV-2 viral copies this assay can detect is 138 copies/mL. A negative result does not preclude SARS-Cov-2 infection and should not be used as the sole basis for treatment or other patient management decisions. A negative result may occur with  improper specimen collection/handling, submission of specimen other than nasopharyngeal swab, presence of viral mutation(s) within the areas targeted by this assay, and inadequate number of viral copies(<138 copies/mL). A negative result must be combined with clinical observations, patient history, and epidemiological information. The expected result is Negative.  Fact Sheet for Patients:  EntrepreneurPulse.com.au  Fact Sheet for Healthcare Providers:  IncredibleEmployment.be  This test is no t yet approved or cleared by the [place] FDA and  has been authorized for detection and/or diagnosis of SARS-CoV-2 by FDA under an Emergency Use Authorization (EUA). This EUA will remain  in effect (meaning this test can be used) for the duration of the COVID-19 declaration under Section 564(b)(1) of the Act, 21 U.S.C.section 360bbb-3(b)(1), unless the  authorization is terminated  or revoked sooner.       Influenza A by PCR NEGATIVE NEGATIVE Final   Influenza B by PCR NEGATIVE NEGATIVE Final    Comment: (NOTE) The Xpert Xpress SARS-CoV-2/FLU/RSV plus assay is intended as an aid in the diagnosis of influenza from Nasopharyngeal swab specimens and should not be used as a sole basis for treatment. Nasal washings and aspirates are unacceptable for Xpert Xpress SARS-CoV-2/FLU/RSV testing.  Fact Sheet for Patients: EntrepreneurPulse.com.au  Fact Sheet for Healthcare Providers: IncredibleEmployment.be  This test is not yet approved or cleared by the [place] FDA and has been authorized for detection and/or diagnosis of SARS-CoV-2 by FDA under an Emergency Use Authorization (EUA). This EUA will remain in effect (meaning this test can be used) for the duration of the COVID-19 declaration under Section 564(b)(1) of the Act, 21 U.S.C. section 360bbb-3(b)(1), unless the authorization is terminated or revoked.  Performed at [facility], [place] [address] [place], [place] [address]   Surgical pcr screen     Status: None   Collection Time: 07/22/20  9:46 AM   Specimen: Nasal Mucosa; Nasal Swab  Result Value Ref Range Status   MRSA, PCR NEGATIVE NEGATIVE Final   Staphylococcus aureus NEGATIVE NEGATIVE Final    Comment: (NOTE) The Xpert SA Assay (FDA approved for NASAL specimens in patients 3 years of age and older), is one component of a comprehensive surveillance program. It is not intended to diagnose infection nor to guide or monitor treatment. Performed at [facility], [place] [address] [place], [place] [address]      Studies: DG Abd Portable 1V  Result Date: 07/26/2020 CLINICAL DATA:  Postoperative ileus EXAM: PORTABLE ABDOMEN - 1 VIEW COMPARISON:  07/22/2020 FINDINGS: Nasogastric tube in the fundus of the stomach. Ordinary amount of gas within the  small and large bowel. No evidence of frank ileus presently. IMPRESSION: Nasogastric tube in place.  Gas pattern unremarkable. Electronically Signed   By: Nelson Chimes M.D.   On: 07/26/2020 10:26     Flora Lipps, MD  [facility] 07/28/2020  If 7PM-7AM, please contact night-coverage

## 2020-07-28 NOTE — Progress Notes (Signed)
     Assessment & Plan: POD#6 - S/p exploratory laparotomy with LOA 07/22/20 Dr. Ninfa Linden -NG decompression - TNA -continue to mobilize, OOB, ambulate - encouraged - await return in bowel function        Theresa Gemma, MD       [facility]       Office: [phone]   Chief Complaint: SBO  Subjective: Patient in bed, comfortable.  Denies flatus or BM's.  Not getting up very well.  Objective: Vital signs in last 24 hours: Temp:  [98.4 F (36.9 C)-98.7 F (37.1 C)] 98.7 F (37.1 C) (01/16 0542) Pulse Rate:  [85-89] 85 (01/16 0542) Resp:  [15-16] 15 (01/16 0542) BP: (100-119)/(60-66) 110/63 (01/16 0542) SpO2:  [98 %-99 %] 98 % (01/16 0542) Last BM Date:  (uta)  Intake/Output from previous day: 01/15 0701 - 01/16 0700 In: 1242.7 [I.V.:1021.5; IV Piggyback:221.2] Out: 3350 [Urine:3250; Emesis/NG output:100] Intake/Output this shift: No intake/output data recorded.  Physical Exam: HEENT - sclerae clear, mucous membranes moist Abdomen - soft, protuberant; non-tender; quiet to auscultation Ext - no edema, non-tender Neuro - alert & oriented, no focal deficits  Lab Results:  Recent Labs    07/26/20 0407 07/28/20 0313  WBC 6.4 9.9  HGB 10.3* 8.7*  HCT 30.9* 25.8*  PLT 176 212   BMET Recent Labs    07/27/20 0334 07/28/20 0313  NA 136 133*  K 4.6 4.6  CL 104 99  CO2 26 26  GLUCOSE 144* 140*  BUN 13 10  CREATININE 0.38* 0.40*  CALCIUM 7.9* 8.4*   PT/INR No results for input(s): LABPROT, INR in the last 72 hours. Comprehensive Metabolic Panel:    Component Value Date/Time   NA 133 (L) 07/28/2020 0313   NA 136 07/27/2020 0334   K 4.6 07/28/2020 0313   K 4.6 07/27/2020 0334   CL 99 07/28/2020 0313   CL 104 07/27/2020 0334   CO2 26 07/28/2020 0313   CO2 26 07/27/2020 0334   BUN 10 07/28/2020 0313   BUN 13 07/27/2020 0334   CREATININE 0.40 (L) 07/28/2020 0313   CREATININE 0.38 (L) 07/27/2020 0334   GLUCOSE 140 (H) 07/28/2020 0313    GLUCOSE 144 (H) 07/27/2020 0334   CALCIUM 8.4 (L) 07/28/2020 0313   CALCIUM 7.9 (L) 07/27/2020 0334   AST 20 07/27/2020 0334   AST 23 07/26/2020 0407   ALT 10 07/27/2020 0334   ALT 11 07/26/2020 0407   ALKPHOS 24 (L) 07/27/2020 0334   ALKPHOS 30 (L) 07/26/2020 0407   BILITOT 0.2 (L) 07/27/2020 0334   BILITOT 0.1 (L) 07/26/2020 0407   PROT 4.8 (L) 07/27/2020 0334   PROT 5.2 (L) 07/26/2020 0407   ALBUMIN 2.0 (L) 07/27/2020 0334   ALBUMIN 2.3 (L) 07/26/2020 0407    Studies/Results: DG Abd Portable 1V  Result Date: 07/26/2020 CLINICAL DATA:  Postoperative ileus EXAM: PORTABLE ABDOMEN - 1 VIEW COMPARISON:  07/22/2020 FINDINGS: Nasogastric tube in the fundus of the stomach. Ordinary amount of gas within the small and large bowel. No evidence of frank ileus presently. IMPRESSION: Nasogastric tube in place.  Gas pattern unremarkable. Electronically Signed   By: Nelson Chimes M.D.   On: 07/26/2020 10:26      Theresa Arnold [birth year]  Patient ID: Theresa Arnold, female   DOB: [birth year], 87 y.o.   MRN: [id number]

## 2020-07-28 NOTE — Progress Notes (Signed)
PHARMACY - TOTAL PARENTERAL NUTRITION CONSULT NOTE   Indication: Bowel obstruction  Patient Measurements: Height: 5' (152.4 cm) Weight: 55.9 kg (123 lb 3.8 oz) IBW/kg (Calculated) : 45.5 TPN AdjBW (KG): 49.4 Body mass index is 24.07 kg/m.  Assessment:  Ms. Yorio is an 87 yo female with PMH diverticulitis (s/p Hartmann procedure 03/22/19 with takedown on 09/04/19), HLD, COPD who presented to the hospital with abdominal pain and nausea/vomiting.  She had reported that she had not had any food for approximately 1 week prior to admission and was only drinking Gatorade and water. She also reported that she had no flatus or bowel movements also within 1 week.  Glucose / Insulin: range 124-143 (CBG goal < 150), 0 units SSI in past 24 hours  Electrolytes: WNL except Na 133 Renal: WNL  LFTs / TGs: WNL (1/15)/ 83 ( 1/11)  Prealbumin / albumin: 6.7 (1/11) low, / albumin 2 (1/15) low    Intake / Output; MIVF: ~ 3.2 L UOP/24 hr, NG output only 100 mL, LR discontinued by TRH on 1/11  GI Imaging: - 1/8 CT of abdomen shows mechanical small bowel obstruction with transition point in the right lower quadrant. Underlying adhesive disease is suspected. - 1/10 persistent SBO   Surgeries / Procedures:  - 1/10 to OR  for exploratory laparotomy   Central access: will obtain PICC after procedure on 1/10 per consult - PICC not obtained on 1/10, line finally placed 1/11  TPN started at 0939 on 1/11   TPN start date:  1/11  Nutritional Goals:  (per RD recommendation on 1/10):  Estimated Nutritional Needs:  Kcal:  1600-1800 Protein:  70-85g Fluid:  1.8L/day  Goal TPN rate is 70 mL/hr (provides 75.6  g of protein and 1777 kcals per day)  Current Nutrition:  NPO, ice chips  Plan:   Continue TPN at 70 mL/hr with next bag at 1800   Continue with current electroltyes except increase Na to 75 meq/L  Electrolytes in TPN:  80mEq/L of Na, 70 mEq/L of K, 38mEq/L of Ca, 8 mEq/L of Mg, and 20 mmol/L of  Phos. Cl:Ac ratio 1:1  Add thiamine 100 mg/L and chromium   Add standard MVI and trace elements to TPN  Reduce Very Sensitive to q8H SSI  Monitor TPN labs on Mon/Thurs  Napoleon Form  07/28/2020, 9:50 AM

## 2020-07-29 ENCOUNTER — Inpatient Hospital Stay (HOSPITAL_COMMUNITY): Payer: Medicare Other

## 2020-07-29 DIAGNOSIS — K56609 Unspecified intestinal obstruction, unspecified as to partial versus complete obstruction: Secondary | ICD-10-CM | POA: Diagnosis not present

## 2020-07-29 LAB — COMPREHENSIVE METABOLIC PANEL
ALT: 17 U/L (ref 0–44)
AST: 43 U/L — ABNORMAL HIGH (ref 15–41)
Albumin: 2.6 g/dL — ABNORMAL LOW (ref 3.5–5.0)
Alkaline Phosphatase: 53 U/L (ref 38–126)
Anion gap: 10 (ref 5–15)
BUN: 12 mg/dL (ref 8–23)
CO2: 26 mmol/L (ref 22–32)
Calcium: 8.9 mg/dL (ref 8.9–10.3)
Chloride: 98 mmol/L (ref 98–111)
Creatinine, Ser: 0.46 mg/dL (ref 0.44–1.00)
GFR, Estimated: >60 mL/min (ref >60)
Glucose, Bld: 138 mg/dL — ABNORMAL HIGH (ref 70–99)
Potassium: 4.6 mmol/L (ref 3.5–5.1)
Sodium: 134 mmol/L — ABNORMAL LOW (ref 135–145)
Total Bilirubin: 0.1 mg/dL — ABNORMAL LOW (ref 0.3–1.2)
Total Protein: 6.1 g/dL — ABNORMAL LOW (ref 6.5–8.1)

## 2020-07-29 LAB — CBC
HCT: 26.7 % — ABNORMAL LOW (ref 36.0–46.0)
Hemoglobin: 8.9 g/dL — ABNORMAL LOW (ref 12.0–15.0)
MCH: 36.5 pg — ABNORMAL HIGH (ref 26.0–34.0)
MCHC: 33.3 g/dL (ref 30.0–36.0)
MCV: 109.4 fL — ABNORMAL HIGH (ref 80.0–100.0)
Platelets: 259 10*3/uL (ref 150–400)
RBC: 2.44 MIL/uL — ABNORMAL LOW (ref 3.87–5.11)
RDW: 14.4 % (ref 11.5–15.5)
WBC: 10.2 10*3/uL (ref 4.0–10.5)
nRBC: 0 % (ref 0.0–0.2)

## 2020-07-29 LAB — DIFFERENTIAL
Abs Immature Granulocytes: 0.11 10*3/uL — ABNORMAL HIGH (ref 0.00–0.07)
Basophils Absolute: 0.1 10*3/uL (ref 0.0–0.1)
Basophils Relative: 1 %
Eosinophils Absolute: 0.1 10*3/uL (ref 0.0–0.5)
Eosinophils Relative: 1 %
Immature Granulocytes: 1 %
Lymphocytes Relative: 14 %
Lymphs Abs: 1.4 10*3/uL (ref 0.7–4.0)
Monocytes Absolute: 0.8 10*3/uL (ref 0.1–1.0)
Monocytes Relative: 8 %
Neutro Abs: 7.7 10*3/uL (ref 1.7–7.7)
Neutrophils Relative %: 75 %

## 2020-07-29 LAB — MAGNESIUM: Magnesium: 2.1 mg/dL (ref 1.7–2.4)

## 2020-07-29 LAB — TRIGLYCERIDES: Triglycerides: 98 mg/dL (ref <150)

## 2020-07-29 LAB — PREALBUMIN: Prealbumin: 22.9 mg/dL (ref 18–38)

## 2020-07-29 LAB — GLUCOSE, CAPILLARY
Glucose-Capillary: 137 mg/dL — ABNORMAL HIGH (ref 70–99)
Glucose-Capillary: 147 mg/dL — ABNORMAL HIGH (ref 70–99)
Glucose-Capillary: 154 mg/dL — ABNORMAL HIGH (ref 70–99)

## 2020-07-29 LAB — PHOSPHORUS: Phosphorus: 4.6 mg/dL (ref 2.5–4.6)

## 2020-07-29 MED ORDER — GUAIFENESIN ER 600 MG PO TB12
600.0000 mg | ORAL_TABLET | Freq: Two times a day (BID) | ORAL | Status: DC
  Administered 2020-07-29 – 2020-08-03 (×8): 600 mg via ORAL
  Filled 2020-07-29 (×11): qty 1

## 2020-07-29 MED ORDER — TRACE MINERALS CU-MN-SE-ZN 300-55-60-3000 MCG/ML IV SOLN
INTRAVENOUS | Status: AC
  Filled 2020-07-29: qty 504

## 2020-07-29 NOTE — Progress Notes (Signed)
Handoff given to [place], Therapist, sports. Aware awaiting MD to call back.

## 2020-07-29 NOTE — Progress Notes (Signed)
Paged Dr. Brantley Stage about NGT. Pt ok. No c/o nausea. No distress.

## 2020-07-29 NOTE — Progress Notes (Signed)
Attempted to Exchange NGT, tried twice and refused to let us attempt again. Paged doctor. No distress.

## 2020-07-29 NOTE — Progress Notes (Signed)
PROGRESS NOTE    Theresa Arnold  DUK:[id number] DOB: June 08, 1932 DOA: 07/19/2020 PCP: Biagio Borg, MD   Chief Complain: Abdominal pain  Brief Narrative:  Theresa Arnold is an 87 yo female with PMH diverticulitis (s/p Hartmann procedure 03/22/19 with takedown on 09/04/19), hyperlipidemia, COPD presented to hospital with complaints of abdominal pain nausea and vomiting. NG tube attempt was made in the ER however unsuccessful and IR guided placement was ordered.  CT scan of the abdomen pelvis showed small bowel obstruction so general surgery was consulted.  Patient had minimal improvement with bowel rest, NGT placement and was taken for exploratory laparotomy on 07/22/2020. She underwent lysis of adhesions and tolerated procedure well. Due to her prolonged n.p.o. intake while awaiting return of bowel function, she was considered for TPN and PICC line placement due to expected postop ileus.   Patient was continued on NG tube and surgery followed the patient during hospitalization due to prolonged ileus.  Assessment & Plan:   Principal Problem:   SBO (small bowel obstruction) (HCC) Active Problems:   Diverticulosis of colon   COPD (chronic obstructive pulmonary disease) (HCC)   Thrombocytopenia (HCC)   Hypokalemia   Macrocytosis without anemia   Small bowel obstruction/postoperative ileus: Patient underwent expiratory laparotomy on 07/22/2018 with lysis of adhesions.  History of Hartman's procedure and reversal in the past. Prolonged postoperative ileus.  Still awaiting bowel function return.  Currently on NG tube.  Has been started on TPN through PICC line.  NG tube draining dark fluid.  On IV Protonix.  Continue pain management, supportive care, IV fluids Prolonged hospital course  Macrocytic anemia: Vitamin B76 and folic acid normal.  Currently hemoglobin is stable.  Continue monitoring.  Thrombocytopenia: Resolved  Hypokalemia/hypophosphatemia/hypomagnesemia: Continue to monitor and  supplement as necessary.  History of COPD: Currently respiratory status stable.  On room air.  Continue bronchodilators as needed.  History of diverticulosis of colon: Status post Hartman's procedure for obstructed diverticulitis in the past.  Status post reversal  Protein calorie malnutrition: Currently on TPN.  Nutrition following  Deconditioning/debility: PT recommended home health on discharge   Nutrition Problem: Inadequate oral intake Etiology: nausea,vomiting (SBO)      DVT prophylaxis:SCD Code Status: Full Family Communication: None at bedside Status is: Inpatient  Remains inpatient appropriate because:Inpatient level of care appropriate due to severity of illness   Dispo: The patient is from: Home              Anticipated d/c is to: Home              Anticipated d/c date is:Not known              Patient currently is not medically stable to d/c.     Consultants: Surgery  Procedures:None  Antimicrobials:  Anti-infectives (From admission, onward)   Start     Dose/Rate Route Frequency Ordered Stop   07/22/20 0915  ceFAZolin (ANCEF) IVPB 2g/100 mL premix        2 g 200 mL/hr over 30 Minutes Intravenous On call to O.R. 07/22/20 0908 07/22/20 1204      Subjective: Patient seen and examined at the bedside this morning.  Hemodynamically stable.  Complains of abdominal pain and discomfort.  Denies any passing of gas or bowel movement.  NG tube draining dark fluid  Objective: Vitals:   07/28/20 0542 07/28/20 1409 07/28/20 2209 07/29/20 0600  BP: 110/63 107/68 115/67 112/74  Pulse: 85 89 78 90  Resp: 15 18 18  16  Temp: 98.7 F (37.1 C) 99.1 F (37.3 C) 98.9 F (37.2 C) 99.2 F (37.3 C)  TempSrc: Oral Oral Oral Oral  SpO2: 98% 99% 100% 98%  Weight:      Height:        Intake/Output Summary (Last 24 hours) at 07/29/2020 1215 Last data filed at 07/29/2020 0600 Gross per 24 hour  Intake 1227.89 ml  Output 1500 ml  Net -272.11 ml   Filed Weights    07/19/20 1511 07/22/20 1043  Weight: 61.2 kg 55.9 kg    Examination:  General exam: Not in distress,thin built HEENT:nG tube Respiratory system: Bilateral equal air entry, normal vesicular breath sounds, no wheezes or crackles  Cardiovascular system: S1 & S2 heard, RRR. No JVD, murmurs, rubs, gallops or clicks. Gastrointestinal system: Abdomen is distended, soft and is appropriately tender. No organomegaly or masses felt. No bowel sounds heard.  Midline surgical incision covered with dressing Central nervous system: Alert and oriented. No focal neurological deficits. Extremities: No edema, no clubbing ,no cyanosis Skin: No rashes, lesions or ulcers,no icterus ,no pallor    Data Reviewed: I have personally reviewed following labs and imaging studies  CBC: Recent Labs  Lab 07/23/20 0525 07/24/20 0300 07/25/20 0440 07/26/20 0407 07/28/20 0313 07/29/20 0325  WBC 7.6 7.0 8.1 6.4 9.9 10.2  NEUTROABS 5.6 5.1 5.8  --   --  7.7  HGB 12.7 11.1* 10.4* 10.3* 8.7* 8.9*  HCT 38.5 33.6* 30.9* 30.9* 25.8* 26.7*  MCV 108.8* 109.8* 108.8* 108.8* 109.8* 109.4*  PLT 158 158 149* 176 212 [id number]   Basic Metabolic Panel: Recent Labs  Lab 07/25/20 0440 07/26/20 0407 07/27/20 0334 07/28/20 0313 07/29/20 0325  NA 136 136 136 133* 134*  K 3.2* 4.0 4.6 4.6 4.6  CL 102 100 104 99 98  CO2 28 26 26 26 26   GLUCOSE 156* 131* 144* 140* 138*  BUN 13 10 13 10 12   CREATININE 0.36* 0.34* 0.38* 0.40* 0.46  CALCIUM 7.4* 7.8* 7.9* 8.4* 8.9  MG 2.1 2.1 2.2 2.1 2.1  PHOS 1.9* 2.6 3.1 4.0 4.6   GFR: Estimated Creatinine Clearance: 39.6 mL/min (by C-G formula based on SCr of 0.46 mg/dL). Liver Function Tests: Recent Labs  Lab 07/24/20 0300 07/25/20 0440 07/26/20 0407 07/27/20 0334 07/29/20 0325  AST 22 26 23 20  43*  ALT 12 12 11 10 17   ALKPHOS 26* 26* 30* 24* 53  BILITOT 0.3 0.5 0.1* 0.2* 0.1*  PROT 4.9* 4.7* 5.2* 4.8* 6.1*  ALBUMIN 2.3* 2.2* 2.3* 2.0* 2.6*   No results for input(s): LIPASE,  AMYLASE in the last 168 hours. No results for input(s): AMMONIA in the last 168 hours. Coagulation Profile: No results for input(s): INR, PROTIME in the last 168 hours. Cardiac Enzymes: No results for input(s): CKTOTAL, CKMB, CKMBINDEX, TROPONINI in the last 168 hours. BNP (last 3 results) No results for input(s): PROBNP in the last 8760 hours. HbA1C: No results for input(s): HGBA1C in the last 72 hours. CBG: Recent Labs  Lab 07/28/20 0539 07/28/20 1202 07/28/20 1412 07/28/20 2210 07/29/20 0601  GLUCAP 143* 134* 131* 142* 137*   Lipid Profile: Recent Labs    07/29/20 0325  TRIG 98   Thyroid Function Tests: No results for input(s): TSH, T4TOTAL, FREET4, T3FREE, THYROIDAB in the last 72 hours. Anemia Panel: No results for input(s): VITAMINB12, FOLATE, FERRITIN, TIBC, IRON, RETICCTPCT in the last 72 hours. Sepsis Labs: No results for input(s): PROCALCITON, LATICACIDVEN in the last 168 hours.  Recent Results (from  the past 240 hour(s))  Resp Panel by RT-PCR (Flu A&B, Covid) Nasopharyngeal Swab     Status: None   Collection Time: 07/20/20  3:05 AM   Specimen: Nasopharyngeal Swab; Nasopharyngeal(NP) swabs in vial transport medium  Result Value Ref Range Status   SARS Coronavirus 2 by RT PCR NEGATIVE NEGATIVE Final    Comment: (NOTE) SARS-CoV-2 target nucleic acids are NOT DETECTED.  The SARS-CoV-2 RNA is generally detectable in upper respiratory specimens during the acute phase of infection. The lowest concentration of SARS-CoV-2 viral copies this assay can detect is 138 copies/mL. A negative result does not preclude SARS-Cov-2 infection and should not be used as the sole basis for treatment or other patient management decisions. A negative result may occur with  improper specimen collection/handling, submission of specimen other than nasopharyngeal swab, presence of viral mutation(s) within the areas targeted by this assay, and inadequate number of viral copies(<138  copies/mL). A negative result must be combined with clinical observations, patient history, and epidemiological information. The expected result is Negative.  Fact Sheet for Patients:  EntrepreneurPulse.com.au  Fact Sheet for Healthcare Providers:  IncredibleEmployment.be  This test is no t yet approved or cleared by the [place] FDA and  has been authorized for detection and/or diagnosis of SARS-CoV-2 by FDA under an Emergency Use Authorization (EUA). This EUA will remain  in effect (meaning this test can be used) for the duration of the COVID-19 declaration under Section 564(b)(1) of the Act, 21 U.S.C.section 360bbb-3(b)(1), unless the authorization is terminated  or revoked sooner.       Influenza A by PCR NEGATIVE NEGATIVE Final   Influenza B by PCR NEGATIVE NEGATIVE Final    Comment: (NOTE) The Xpert Xpress SARS-CoV-2/FLU/RSV plus assay is intended as an aid in the diagnosis of influenza from Nasopharyngeal swab specimens and should not be used as a sole basis for treatment. Nasal washings and aspirates are unacceptable for Xpert Xpress SARS-CoV-2/FLU/RSV testing.  Fact Sheet for Patients: EntrepreneurPulse.com.au  Fact Sheet for Healthcare Providers: IncredibleEmployment.be  This test is not yet approved or cleared by the [place] FDA and has been authorized for detection and/or diagnosis of SARS-CoV-2 by FDA under an Emergency Use Authorization (EUA). This EUA will remain in effect (meaning this test can be used) for the duration of the COVID-19 declaration under Section 564(b)(1) of the Act, 21 U.S.C. section 360bbb-3(b)(1), unless the authorization is terminated or revoked.  Performed at [facility], [place] [address], [place], [place] [address]   Surgical pcr screen     Status: None   Collection Time: 07/22/20  9:46 AM   Specimen: Nasal Mucosa; Nasal Swab  Result  Value Ref Range Status   MRSA, PCR NEGATIVE NEGATIVE Final   Staphylococcus aureus NEGATIVE NEGATIVE Final    Comment: (NOTE) The Xpert SA Assay (FDA approved for NASAL specimens in patients 44 years of age and older), is one component of a comprehensive surveillance program. It is not intended to diagnose infection nor to guide or monitor treatment. Performed at [facility], [place] [address] [place], [place] [address]          Radiology Studies: No results found.      Scheduled Meds: . Chlorhexidine Gluconate Cloth  6 each Topical Daily  . guaiFENesin  600 mg Oral BID  . insulin aspart  0-6 Units Subcutaneous Q8H  . lip balm  1 application Topical BID  . pantoprazole (PROTONIX) IV  40 mg Intravenous Q12H  .  sodium chloride flush  10-40 mL Intracatheter Q12H   Continuous Infusions: . methocarbamol (ROBAXIN) IV 1,000 mg (07/29/20 1027)  . TPN ADULT (ION) 70 mL/hr at 07/28/20 2200  . TPN ADULT (ION)       LOS: 9 days    Time spent:25 mins. More than 50% of that time was spent in counseling and/or coordination of care.      Shelly Coss, MD [facility] P1/17/2022, 12:15 PM

## 2020-07-29 NOTE — Progress Notes (Signed)
Nutrition Follow-up  INTERVENTION:   -TPN management per Pharmacy  -Will monitor for diet advancement  NUTRITION DIAGNOSIS:   Inadequate oral intake related to nausea,vomiting (SBO) as evidenced by per patient/family report,NPO status.  Ongoing.  GOAL:   Patient will meet greater than or equal to 90% of their needs  Meeting with TPN.  MONITOR:   Labs,Weight trends,I & O's,Diet advancement (TPN)  ASSESSMENT:   87 yo female with PMH diverticulitis (s/p Hartmann procedure 03/22/19 with takedown on 09/04/19), HLD, COPD who presented to the hospital with abdominal pain and nausea/vomiting.  She had reported that she had not had any food for approximately 1 week prior to admission and was only drinking Gatorade and water.  She also reported that she had no flatus or bowel movements also within 1 week.  NG tube attempt was made in the ER however unsuccessful and IR guided placement was ordered.  General surgery was also consulted as CT abdomen/pelvis showed a small bowel obstruction in the right lower quadrant with transition point.  1/7: admitted 1/8: NGT placed 1/10: s/p ex lap, LOA 1/11: PICC placed, TPN initiated  Patient continues to be NPO d/t prolonged ileus. TPN infusing at 70 ml/hr, providing 1777 kcals and 75g protein.  Admission weight: 135 lbs. Last recorded weight 1/10: 123 lbs.  I/Os: +637 ml since admit UOP: 2L x 24 hrs NGT: 200 ml   Medications reviewed.  Labs reviewed:  CBGs: 137-142 Low Na  Diet Order:   Diet Order            Diet NPO time specified Except for: Ice Chips  Diet effective now                 EDUCATION NEEDS:   No education needs have been identified at this time  Skin:  Skin Assessment: Reviewed RN Assessment  Last BM:  1/9 -type 1  Height:   Ht Readings from Last 1 Encounters:  07/19/20 5' (1.524 m)    Weight:   Wt Readings from Last 1 Encounters:  07/22/20 55.9 kg   BMI:  Body mass index is 24.07  kg/m.  Estimated Nutritional Needs:   Kcal:  1600-1800  Protein:  70-85g  Fluid:  1.8L/day   Clayton Bibles, MS, RD, LDN Inpatient Clinical Dietitian Contact information available via Amion

## 2020-07-29 NOTE — Progress Notes (Signed)
     Assessment & Plan: POD#7 - S/p exploratory laparotomy with LOA 07/22/20 Dr. Ninfa Linden -NG decompression >> NG tube is not working. Acting clogged. Blue port will not flush. Not pulling fluid the way it should. Remove and replace with a new tube. KUB to confirm placement. - TNA -continue to mobilize, OOB, ambulate - encouraged - await return in bowel function        [name], [facility]       Office: [phone]   Chief Complaint: SBO  Subjective: Patient in bed, comfortable.  Denies flatus or BM's.  States she got up to the chair yesterday  Objective: Vital signs in last 24 hours: Temp:  [98.9 F (37.2 C)-99.2 F (37.3 C)] 99.2 F (37.3 C) (01/17 0600) Pulse Rate:  [78-90] 90 (01/17 0600) Resp:  [16-18] 16 (01/17 0600) BP: (107-115)/(67-74) 112/74 (01/17 0600) SpO2:  [98 %-100 %] 98 % (01/17 0600) Last BM Date:  (uta)  Intake/Output from previous day: 01/16 0701 - 01/17 0700 In: 1227.9 [I.V.:827.8; IV Piggyback:400.1] Out: 2200 [Urine:2000; Emesis/NG output:200] Intake/Output this shift: No intake/output data recorded.  Physical Exam: HEENT - sclerae clear, mucous membranes moist Abdomen - soft, protuberant; non-tender; quiet to auscultation Ext - no edema, non-tender Neuro - alert & oriented, no focal deficits  Lab Results:  Recent Labs    07/28/20 0313 07/29/20 0325  WBC 9.9 10.2  HGB 8.7* 8.9*  HCT 25.8* 26.7*  PLT 212 259   BMET Recent Labs    07/28/20 0313 07/29/20 0325  NA 133* 134*  K 4.6 4.6  CL 99 98  CO2 26 26  GLUCOSE 140* 138*  BUN 10 12  CREATININE 0.40* 0.46  CALCIUM 8.4* 8.9   PT/INR No results for input(s): LABPROT, INR in the last 72 hours. Comprehensive Metabolic Panel:    Component Value Date/Time   NA 134 (L) 07/29/2020 0325   NA 133 (L) 07/28/2020 0313   K 4.6 07/29/2020 0325   K 4.6 07/28/2020 0313   CL 98 07/29/2020 0325   CL 99 07/28/2020 0313   CO2 26 07/29/2020 0325    CO2 26 07/28/2020 0313   BUN 12 07/29/2020 0325   BUN 10 07/28/2020 0313   CREATININE 0.46 07/29/2020 0325   CREATININE 0.40 (L) 07/28/2020 0313   GLUCOSE 138 (H) 07/29/2020 0325   GLUCOSE 140 (H) 07/28/2020 0313   CALCIUM 8.9 07/29/2020 0325   CALCIUM 8.4 (L) 07/28/2020 0313   AST 43 (H) 07/29/2020 0325   AST 20 07/27/2020 0334   ALT 17 07/29/2020 0325   ALT 10 07/27/2020 0334   ALKPHOS 53 07/29/2020 0325   ALKPHOS 24 (L) 07/27/2020 0334   BILITOT 0.1 (L) 07/29/2020 0325   BILITOT 0.2 (L) 07/27/2020 0334   PROT 6.1 (L) 07/29/2020 0325   PROT 4.8 (L) 07/27/2020 0334   ALBUMIN 2.6 (L) 07/29/2020 0325   ALBUMIN 2.0 (L) 07/27/2020 0334    Studies/Results: No results found.    Darci Current [place] 2/72/5366  Patient ID: Theresa Arnold, female   DOB: 1931/09/07, 87 y.o.   MRN: [id number]

## 2020-07-29 NOTE — Progress Notes (Signed)
Physical Therapy Treatment Patient Details Name: Theresa Arnold MRN: [id number] DOB: [birth year] Today's Date: 07/29/2020    History of Present Illness 87 y.o. female with medical history significant for diverticulitis which required colostomy in past (colostomy has been reversed) who presents for evaluation of abdominal pain with persistent nausea and vomiting. Dx of SBO. s/p exploratory laparoscopy with LOA on 07/22/20.    PT Comments    Pt is not progressing well with mobility. She required Mod assist for mobility on today. High fall risk. She was barely able to walk 5 feet. Mobility is limited by weakness and fatigue. D/C recommendation has been updated to SNF.     Follow Up Recommendations  SNF (HHPT if pt/family decline placement)     Equipment Recommendations  None recommended by PT    Recommendations for Other Services       Precautions / Restrictions Precautions Precautions: Fall Precaution Comments: abdominal surgery, NG tube Restrictions Weight Bearing Restrictions: No    Mobility  Bed Mobility Overal bed mobility: Needs Assistance Bed Mobility: Supine to Sit   Sidelying to sit: Mod assist;HOB elevated       General bed mobility comments: Assist for trunk/bil LEs and to scoot to EOB. Increased time. Cues for safety, technique.  Transfers Overall transfer level: Needs assistance Equipment used: Rolling walker (2 wheeled) Transfers: Sit to/from [facility] Sit to Stand: Mod assist Stand pivot transfers: Min assist       General transfer comment: Assist to power up, stabilize, control descent. Moderate posterior bias with increased time for pt to gain stability.  Ambulation/Gait Ambulation/Gait assistance: Min assist;+2 safety/equipment Gait Distance (Feet): 5 Feet Assistive device: Rolling walker (2 wheeled) Gait Pattern/deviations: Step-through pattern;Trunk flexed;Shuffle     General Gait Details: Very unsteady. High fall risk.  Shuffling steps forward ~5 feet before deferring further ambulation and pivoting to recliner insteady. Weak and fatigues quickly.   Stairs             Wheelchair Mobility    Modified Rankin (Stroke Patients Only)       Balance Overall balance assessment: Needs assistance         Standing balance support: Bilateral upper extremity supported Standing balance-Leahy Scale: Poor                              Cognition Arousal/Alertness: Awake/alert Behavior During Therapy: WFL for tasks assessed/performed Overall Cognitive Status: Within Functional Limits for tasks assessed                                        Exercises General Exercises - Lower Extremity Long Arc Quad: AROM;Both;Seated;10 reps Hip Flexion/Marching: AROM;Both;5 reps;Standing    General Comments        Pertinent Vitals/Pain Pain Assessment: Faces Faces Pain Scale: Hurts little more Pain Location: abdomen Pain Descriptors / Indicators: Sore;Grimacing;Guarding Pain Intervention(s): Limited activity within patient's tolerance;Monitored during session;Repositioned    Home Living                      Prior Function            PT Goals (current goals can now be found in the care plan section) Progress towards PT goals: Progressing toward goals    Frequency    Min 3X/week      PT Plan Discharge  plan needs to be updated    Co-evaluation              AM-PAC PT "6 Clicks" Mobility   Outcome Measure  Help needed turning from your back to your side while in a flat bed without using bedrails?: A Lot Help needed moving from lying on your back to sitting on the side of a flat bed without using bedrails?: A Lot Help needed moving to and from a bed to a chair (including a wheelchair)?: A Lot Help needed standing up from a chair using your arms (e.g., wheelchair or bedside chair)?: A Lot Help needed to walk in hospital room?: A Lot Help needed  climbing 3-5 steps with a railing? : Total 6 Click Score: 11    End of Session Equipment Utilized During Treatment: Gait belt Activity Tolerance: Patient limited by fatigue Patient left: in chair;with call bell/phone within reach;with chair alarm set   PT Visit Diagnosis: Muscle weakness (generalized) (M62.81);Difficulty in walking, not elsewhere classified (R26.2)     Time: 2706-2376 PT Time Calculation (min) (ACUTE ONLY): 25 min  Charges:  $Gait Training: 8-22 mins $Therapeutic Exercise: 8-22 mins                         Doreatha Massed, PT Acute Rehabilitation  Office: [phone] Pager: [phone]

## 2020-07-29 NOTE — Progress Notes (Signed)
PHARMACY - TOTAL PARENTERAL NUTRITION CONSULT NOTE   Indication: Bowel obstruction  Patient Measurements: Height: 5' (152.4 cm) Weight: 55.9 kg (123 lb 3.8 oz) IBW/kg (Calculated) : 45.5 TPN AdjBW (KG): 49.4 Body mass index is 24.07 kg/m.  Assessment:  Theresa Arnold is an 87 yo female with PMH diverticulitis (s/p Hartmann procedure 03/22/19 with takedown on 09/04/19), HLD, COPD who presented to the hospital with abdominal pain and nausea/vomiting.  She had reported that she had not had any food for approximately 1 week prior to admission and was only drinking Gatorade and water. She also reported that she had no flatus or bowel movements also within 1 week.  Glucose / Insulin: range 130-140s (CBG goal < 150), 0 units SSI in past 24 hours  Electrolytes: WNL except Na 134 Renal: WNL  LFTs / TGs: WNL (1/15)/ 83 ( 1/11), 98 (1/17) Prealbumin / albumin: 6.7 (1/11), 23 (1/17) low, / albumin 2 (1/15) low    Intake / Output; MIVF: ~ 3.4 L UOP/24 hr, NG output only 200 mL, LR discontinued by TRH on 1/11  GI Imaging: - 1/8 CT of abdomen shows mechanical small bowel obstruction with transition point in the right lower quadrant. Underlying adhesive disease is suspected. - 1/10 persistent SBO   Surgeries / Procedures:  - 1/10 to OR  for exploratory laparotomy   Central access: will obtain PICC after procedure on 1/10 per consult - PICC not obtained on 1/10, line finally placed 1/11, TPN started at 0939 1/11   TPN start date:  1/11  Nutritional Goals:  (per RD recommendation on 1/10):  Estimated Nutritional Needs:  Kcal:  1600-1800 Protein:  70-85g Fluid:  1.8L/day  Goal TPN rate is 70 mL/hr (provides 75.6  g of protein and 1777 kcals per day)  Current Nutrition:  NPO, ice chips  Plan:   Continue TPN at 70 mL/hr with next bag at 1800   Electrolytes in TPN:  88mEq/L of Na, 70 mEq/L of K, 64mEq/L of Ca, 8 mEq/L of Mg, reduce to 15 mmol/L of Phos. Cl:Ac ratio 1:1  Add thiamine 100  mg/L and chromium   Add standard MVI and trace elements to TPN  Reduce Very Sensitive to q8H SSI  Monitor TPN labs on Mon/Thurs  BMET, Mag, Phos in am  Minda Ditto PharmD 07/29/2020, 10:27 AM

## 2020-07-30 ENCOUNTER — Inpatient Hospital Stay (HOSPITAL_COMMUNITY): Payer: Medicare Other

## 2020-07-30 LAB — MAGNESIUM: Magnesium: 2.1 mg/dL (ref 1.7–2.4)

## 2020-07-30 LAB — GLUCOSE, CAPILLARY
Glucose-Capillary: 120 mg/dL — ABNORMAL HIGH (ref 70–99)
Glucose-Capillary: 122 mg/dL — ABNORMAL HIGH (ref 70–99)
Glucose-Capillary: 124 mg/dL — ABNORMAL HIGH (ref 70–99)
Glucose-Capillary: 130 mg/dL — ABNORMAL HIGH (ref 70–99)
Glucose-Capillary: 133 mg/dL — ABNORMAL HIGH (ref 70–99)

## 2020-07-30 LAB — BASIC METABOLIC PANEL
Anion gap: 9 (ref 5–15)
BUN: 14 mg/dL (ref 8–23)
CO2: 27 mmol/L (ref 22–32)
Calcium: 8.7 mg/dL — ABNORMAL LOW (ref 8.9–10.3)
Chloride: 98 mmol/L (ref 98–111)
Creatinine, Ser: 0.47 mg/dL (ref 0.44–1.00)
GFR, Estimated: >60 mL/min (ref >60)
Glucose, Bld: 136 mg/dL — ABNORMAL HIGH (ref 70–99)
Potassium: 4.5 mmol/L (ref 3.5–5.1)
Sodium: 134 mmol/L — ABNORMAL LOW (ref 135–145)

## 2020-07-30 LAB — PHOSPHORUS: Phosphorus: 4.1 mg/dL (ref 2.5–4.6)

## 2020-07-30 MED ORDER — INSULIN ASPART 100 UNIT/ML ~~LOC~~ SOLN: 0.0000 [IU] | SUBCUTANEOUS | Status: DC

## 2020-07-30 MED ORDER — TRACE MINERALS CU-MN-SE-ZN 300-55-60-3000 MCG/ML IV SOLN
INTRAVENOUS | Status: DC
  Filled 2020-07-30: qty 503.4

## 2020-07-30 NOTE — Progress Notes (Signed)
Physical Therapy Treatment Patient Details Name: Theresa Arnold MRN: [id number] DOB: [birth year] Today's Date: 07/30/2020    History of Present Illness 87 y.o. female with medical history significant for diverticulitis which required colostomy in past (colostomy has been reversed) who presents for evaluation of abdominal pain with persistent nausea and vomiting. Dx of SBO. s/p exploratory laparoscopy with LOA on 07/22/20.    PT Comments    Pt remains weak and fatigues easily. Worked on strengthening exercises today (sit to stands, LAQs, marching) to tolerance. She remains at high risk for falls. Continue to recommend SNF.    Follow Up Recommendations  SNF (if family declines placement, then HHPT)     Equipment Recommendations  None recommended by PT    Recommendations for Other Services       Precautions / Restrictions Precautions Precautions: Fall Precaution Comments: abdominal incision Restrictions Weight Bearing Restrictions: No    Mobility  Bed Mobility Overal bed mobility: Needs Assistance Bed Mobility: Supine to Sit          General bed mobility comments: oob in recliner  Transfers Overall transfer level: Needs assistance Equipment used: Rolling walker (2 wheeled) Transfers: Sit to/from Stand Sit to Stand: Min assist Stand pivot transfers: Min assist       General transfer comment: Assist to power up, stabilize, control descent. Moderate posterior bias with increased time for pt to gain stability.  Ambulation/Gait                 Stairs             Wheelchair Mobility    Modified Rankin (Stroke Patients Only)       Balance Overall balance assessment: Needs assistance Sitting-balance support: Feet supported Sitting balance-Leahy Scale: Good     Standing balance support: Bilateral upper extremity supported Standing balance-Leahy Scale: Poor Standing balance comment: Pt with a posterior lean at times in standing. Pt requires cues  to keep both hands on walker at all times at this point.                            Cognition Arousal/Alertness: Awake/alert Behavior During Therapy: WFL for tasks assessed/performed Overall Cognitive Status: No family/caregiver present to determine baseline cognitive functioning                                 General Comments: Pt appears cognitively intact on surface but demonstrating problem solving deficits. Pt self limits with activity with a fear of being on her feet and mobilizing.      Exercises General Exercises - Lower Extremity Long Arc Quad: AROM;Both;Seated;15 reps Hip Flexion/Marching: AROM;Both;5 reps;Standing Other Exercises Other Exercises: sit to stand x 4 from recliner    General Comments General comments (skin integrity, edema, etc.): Pt self limiting mobilty at this point.  Feel she is ready to advance mobiltiy and adls but tends to refuse.  After long conversation about doing more and how it may help her bowels start moving again, she did agree to do some tasks in standing by the chair.      Pertinent Vitals/Pain Pain Assessment: Faces Faces Pain Scale: Hurts even more Pain Location: abdomen Pain Descriptors / Indicators: Sore;Grimacing;Guarding Pain Intervention(s): Limited activity within patient's tolerance;Monitored during session;Repositioned    Home Living  Prior Function            PT Goals (current goals can now be found in the care plan section) Acute Rehab PT Goals Patient Stated Goal: return to independence with mobility Progress towards PT goals: Progressing toward goals    Frequency    Min 3X/week      PT Plan Current plan remains appropriate    Co-evaluation              AM-PAC PT "6 Clicks" Mobility   Outcome Measure  Help needed turning from your back to your side while in a flat bed without using bedrails?: A Lot Help needed moving from lying on your back to  sitting on the side of a flat bed without using bedrails?: A Lot Help needed moving to and from a bed to a chair (including a wheelchair)?: A Little Help needed standing up from a chair using your arms (e.g., wheelchair or bedside chair)?: A Little Help needed to walk in hospital room?: A Lot Help needed climbing 3-5 steps with a railing? : Total 6 Click Score: 13    End of Session Equipment Utilized During Treatment: Gait belt Activity Tolerance: Patient limited by fatigue Patient left: in chair;with call bell/phone within reach;with chair alarm set   PT Visit Diagnosis: Muscle weakness (generalized) (M62.81);Difficulty in walking, not elsewhere classified (R26.2) Pain - part of body:  (abdomen)     Time: 3151-7616 PT Time Calculation (min) (ACUTE ONLY): 8 min  Charges:  $Therapeutic Exercise: 8-22 mins                        Doreatha Massed, PT Acute Rehabilitation  Office: [phone] Pager: [phone]

## 2020-07-30 NOTE — Progress Notes (Signed)
Occupational Therapy Treatment Patient Details Name: Theresa Arnold MRN: [id number] DOB: [birth year] Today's Date: 07/30/2020    History of present illness 87 y.o. female with medical history significant for diverticulitis which required colostomy in past (colostomy has been reversed) who presents for evaluation of abdominal pain with persistent nausea and vomiting. Dx of SBO. s/p exploratory laparoscopy with LOA on 07/22/20.   OT comments  Pt making very slow progress with adls and adl mobility. Pt with fear of falling keeping her from participating in tasks that require increased mobility.  Spoke with pt at length about increasing activity a bit each day. Spoke with pt about asking to use the [facility] instead of relying on the purewik.  Feel this will assist in bladder retraining and increase mobility.  Will continue to see with focus on OOB tasks.   Follow Up Recommendations  SNF;Supervision/Assistance - 24 hour    Equipment Recommendations  3 in 1 bedside commode    Recommendations for Other Services      Precautions / Restrictions Precautions Precautions: Fall Precaution Comments: abdominal incision       Mobility Bed Mobility Overal bed mobility: Needs Assistance Bed Mobility: Supine to Sit   Sidelying to sit: Mod assist;HOB elevated       General bed mobility comments: Assist for trunk/bil LEs and to scoot to EOB. Increased time. Cues for safety, technique.  Transfers Overall transfer level: Needs assistance Equipment used: Rolling walker (2 wheeled) Transfers: Sit to/from [facility] Sit to Stand: Min assist Stand pivot transfers: Min assist       General transfer comment: Assist to power up, stabilize, control descent. Moderate posterior bias with increased time for pt to gain stability.    Balance Overall balance assessment: Needs assistance Sitting-balance support: Feet supported Sitting balance-Leahy Scale: Good     Standing balance  support: Bilateral upper extremity supported Standing balance-Leahy Scale: Poor Standing balance comment: Pt with a posterior lean at times in standing. Pt requires cues to keep both hands on walker at all times at this point.                           ADL either performed or assessed with clinical judgement   ADL Overall ADL's : Needs assistance/impaired Eating/Feeding: NPO   Grooming: Wash/dry hands;Wash/dry face;Oral care;Sitting;Set up Grooming Details (indicate cue type and reason): pt unable to to problem solve how to do self care in the chair (brush teeth, was face) When cues given for what to do next, pt does not need physical assist. Upper Body Bathing: Minimal assistance;Sitting           Lower Body Dressing: Maximal assistance;Sit to/from stand;Cueing for compensatory techniques Lower Body Dressing Details (indicate cue type and reason): Pt cannot recall if she used to figure 4 sit or bend over to put socks on.  Worked on figure 4 sitting today in prep for more LE dressing tasks. Pt was previously independent with adls but not open to attempting adls now that she is "sick." Spoke with her about moving to a "recovery" phase and starting new adls now that her surgery is over. Toilet Transfer: Minimal assistance;BSC;Stand-pivot Armed forces technical officer Details (indicate cue type and reason): Pt with posterior lean and fear of falling during transfers.  Cues given to keep hands on walker mid transfer for safety. Toileting- Water quality scientist and Hygiene: Sit to/from stand;Moderate assistance;Cueing for compensatory techniques Toileting - Clothing Manipulation Details (indicate cue type and  reason): Pt has become very dependent on purewik.  Pt did not use depends prior to surgery but now states she is not sure when she has to go to the bathroom because the purewik is in.  Talked to nurse and decided to leave it out for now and encouaged pt to call when she needs to use the [facility] to  encourage controlling her bladdar and encourage increased mobiltiy.     Functional mobility during ADLs: Minimal assistance;Rolling walker General ADL Comments: Pt most limited by fatigue and fear.  Feel pt has the abiltiy to walk to bathroom and be more mobile but does have a fear of being up.  Steps talken this session with nursing to increase mobility and independence with toielting.     Vision   Vision Assessment?: No apparent visual deficits   Perception     Praxis      Cognition Arousal/Alertness: Awake/alert Behavior During Therapy: WFL for tasks assessed/performed Overall Cognitive Status: No family/caregiver present to determine baseline cognitive functioning                                 General Comments: Pt appears cognitively intact on surface but demonstrating problem solving deficits and limited attn. span during session. Pt self limits with activity with a fear of being on her feet and mobilizing.  Had long discussion with pt today about the need to be up and moving more.        Exercises     Shoulder Instructions       General Comments Pt self limiting mobilty at this point.  Feel she is ready to advance mobiltiy and adls but tends to refuse.  After long conversation about doing more and how it may help her bowels start moving again, she did agree to do some tasks in standing by the chair.    Pertinent Vitals/ Pain       Pain Assessment: Faces Faces Pain Scale: Hurts even more Pain Location: abdomen Pain Descriptors / Indicators: Sore;Grimacing;Guarding Pain Intervention(s): Limited activity within patient's tolerance;Monitored during session;Patient requesting pain meds-RN notified;RN gave pain meds during session;Repositioned  Home Living                                          Prior Functioning/Environment              Frequency  Min 2X/week        Progress Toward Goals  OT Goals(current goals can now be  found in the care plan section)  Progress towards OT goals: Progressing toward goals  Acute Rehab OT Goals Patient Stated Goal: return to independence with mobility OT Goal Formulation: With patient Time For Goal Achievement: 08/07/20 Potential to Achieve Goals: Fair ADL Goals Pt Will Perform Grooming: with supervision;standing Pt Will Perform Lower Body Bathing: with supervision;sit to/from stand Pt Will Perform Lower Body Dressing: with supervision;sit to/from stand Additional ADL Goal #1: Pt will walk to bathroom with walker and complete all toileting with supervision.  Plan Discharge plan needs to be updated    Co-evaluation                 AM-PAC OT "6 Clicks" Daily Activity     Outcome Measure   Help from another person eating meals?: Total Help from another person taking care of  personal grooming?: None Help from another person toileting, which includes using toliet, bedpan, or urinal?: A Lot Help from another person bathing (including washing, rinsing, drying)?: A Lot Help from another person to put on and taking off regular upper body clothing?: A Little Help from another person to put on and taking off regular lower body clothing?: A Lot 6 Click Score: 14    End of Session    OT Visit Diagnosis: Unsteadiness on feet (R26.81);Other abnormalities of gait and mobility (R26.89);Pain Pain - part of body:  (abdoment)   Activity Tolerance Patient limited by fatigue;Patient limited by pain   Patient Left in chair;with call bell/phone within reach;with chair alarm set   Nurse Communication Mobility status        Time: 0712-1975 OT Time Calculation (min): 27 min  Charges: OT General Charges $OT Visit: 1 Visit OT Treatments $Self Care/Home Management : 23-37 mins   Glenford Peers 07/30/2020, 9:43 AM

## 2020-07-30 NOTE — Care Management Important Message (Signed)
Important Message  Patient Details  IM Letter placed in Patient room. Name: Theresa Arnold MRN: [id number] Date of Birth: 06-12-1932   Medicare Important Message Given:  Yes     Kerin Salen 07/30/2020, 4:21 PM

## 2020-07-30 NOTE — Progress Notes (Signed)
PROGRESS NOTE    Theresa Arnold  ZDG:[id number] DOB: 09/05/1931 DOA: 07/19/2020 PCP: Biagio Borg, MD   Chief Complain: Abdominal pain  Brief Narrative:  Theresa Arnold is an 86 yo female with PMH diverticulitis (s/p Hartmann procedure 03/22/19 with takedown on 09/04/19), hyperlipidemia, COPD presented to hospital with complaints of abdominal pain nausea and vomiting. NG tube attempt was made in the ER however unsuccessful and IR guided placement was ordered.  CT scan of the abdomen pelvis showed small bowel obstruction so general surgery was consulted.  Patient had minimal improvement with bowel rest, NGT placement and was taken for exploratory laparotomy on 07/22/2020. She underwent lysis of adhesions and tolerated procedure well. Due to her prolonged n.p.o. intake while awaiting return of bowel function, she was considered for TPN and PICC line placement due to expected postop ileus.   Surgery following the patient during hospitalization due to prolonged ileus.  No bowel movement yet  Assessment & Plan:   Principal Problem:   SBO (small bowel obstruction) (HCC) Active Problems:   Diverticulosis of colon   COPD (chronic obstructive pulmonary disease) (HCC)   Thrombocytopenia (HCC)   Hypokalemia   Macrocytosis without anemia   Small bowel obstruction/postoperative ileus: Patient underwent expiratory laparotomy on 07/22/2018 with lysis of adhesions.  History of Hartman's procedure and reversal in the past. Prolonged postoperative ileus.  Still awaiting bowel function return.   NG tube came out by itself on 07/30/19,new one not put.  Has been started on TPN through PICC line. On IV Protonix.  Continue pain management, supportive care, IV fluids Prolonged hospital course.  Macrocytic anemia: Vitamin V95 and folic acid normal.  Currently hemoglobin is stable.  Continue monitoring.  Thrombocytopenia: Resolved  Hypokalemia/hypophosphatemia/hypomagnesemia: Continue to monitor and supplement  as necessary.  History of COPD: Currently respiratory status stable.  On room air.  Continue bronchodilators as needed.  History of diverticulosis of colon: Status post Hartman's procedure for obstructed diverticulitis in the past.  Status post reversal  Protein calorie malnutrition: Currently on TPN.  Nutrition following  Deconditioning/debility: PT recommended SNF  on discharge.  TOC consulted   Nutrition Problem: Inadequate oral intake Etiology: nausea,vomiting (SBO)      DVT prophylaxis:SCD Code Status: Full Family Communication: None at bedside Status is: Inpatient  Remains inpatient appropriate because:Inpatient level of care appropriate due to severity of illness   Dispo: The patient is from: Home              Anticipated d/c is to: Home              Anticipated d/c date is:Not known              Patient currently is not medically stable to d/c.     Consultants: Surgery  Procedures:None  Antimicrobials:  Anti-infectives (From admission, onward)   Start     Dose/Rate Route Frequency Ordered Stop   07/22/20 0915  ceFAZolin (ANCEF) IVPB 2g/100 mL premix        2 g 200 mL/hr over 30 Minutes Intravenous On call to O.R. 07/22/20 0908 07/22/20 1204      Subjective: Patient seen and examined the bedside this morning.  Hemodynamically stable.  Sitting in the chair.  NG tube is out.  Abdomen is not that distended or tender .  She has clean midline surgical wound.  I heard some bowel sounds today.  She says she has not been passing any gas.  Objective: Vitals:   07/29/20 1440 07/29/20 1700  07/29/20 2155 07/30/20 0536  BP: 131/69  112/77 (!) 120/59  Pulse: 93  86 89  Resp: 18  18 18   Temp: 99.1 F (37.3 C)  98 F (36.7 C) 98.2 F (36.8 C)  TempSrc: Oral  Oral Oral  SpO2: 98%  98% 100%  Weight:  60.1 kg    Height:        Intake/Output Summary (Last 24 hours) at 07/30/2020 0800 Last data filed at 07/30/2020 0600 Gross per 24 hour  Intake 2192.3 ml  Output  2200 ml  Net -7.7 ml   Filed Weights   07/19/20 1511 07/22/20 1043 07/29/20 1700  Weight: 61.2 kg 55.9 kg 60.1 kg    Examination:    General exam: pleasant elderly female HEENT:PERRL,Oral mucosa moist, Ear/Nose normal on gross exam Respiratory system: Bilateral equal air entry, normal vesicular breath sounds, no wheezes or crackles  Cardiovascular system: S1 & S2 heard, RRR. No JVD, murmurs, rubs, gallops or clicks. Gastrointestinal system: Abdomen is distended, soft and appropriately tender.  Clean midline surgical wound. No organomegaly or masses felt. Slow bowel sounds heard. Central nervous system: Alert and oriented. No focal neurological deficits. Extremities: No edema, no clubbing ,no cyanosis Skin: No rashes, lesions or ulcers,no icterus ,no pallor   Data Reviewed: I have personally reviewed following labs and imaging studies  CBC: Recent Labs  Lab 07/24/20 0300 07/25/20 0440 07/26/20 0407 07/28/20 0313 07/29/20 0325  WBC 7.0 8.1 6.4 9.9 10.2  NEUTROABS 5.1 5.8  --   --  7.7  HGB 11.1* 10.4* 10.3* 8.7* 8.9*  HCT 33.6* 30.9* 30.9* 25.8* 26.7*  MCV 109.8* 108.8* 108.8* 109.8* 109.4*  PLT 158 149* 176 212 161   Basic Metabolic Panel: Recent Labs  Lab 07/26/20 0407 07/27/20 0334 07/28/20 0313 07/29/20 0325 07/30/20 0333  NA 136 136 133* 134* 134*  K 4.0 4.6 4.6 4.6 4.5  CL 100 104 99 98 98  CO2 26 26 26 26 27   GLUCOSE 131* 144* 140* 138* 136*  BUN 10 13 10 12 14   CREATININE 0.34* 0.38* 0.40* 0.46 0.47  CALCIUM 7.8* 7.9* 8.4* 8.9 8.7*  MG 2.1 2.2 2.1 2.1 2.1  PHOS 2.6 3.1 4.0 4.6 4.1   GFR: Estimated Creatinine Clearance: 40.9 mL/min (by C-G formula based on SCr of 0.47 mg/dL). Liver Function Tests: Recent Labs  Lab 07/24/20 0300 07/25/20 0440 07/26/20 0407 07/27/20 0334 07/29/20 0325  AST 22 26 23 20  43*  ALT 12 12 11 10 17   ALKPHOS 26* 26* 30* 24* 53  BILITOT 0.3 0.5 0.1* 0.2* 0.1*  PROT 4.9* 4.7* 5.2* 4.8* 6.1*  ALBUMIN 2.3* 2.2* 2.3*  2.0* 2.6*   No results for input(s): LIPASE, AMYLASE in the last 168 hours. No results for input(s): AMMONIA in the last 168 hours. Coagulation Profile: No results for input(s): INR, PROTIME in the last 168 hours. Cardiac Enzymes: No results for input(s): CKTOTAL, CKMB, CKMBINDEX, TROPONINI in the last 168 hours. BNP (last 3 results) No results for input(s): PROBNP in the last 8760 hours. HbA1C: No results for input(s): HGBA1C in the last 72 hours. CBG: Recent Labs  Lab 07/28/20 2210 07/29/20 0601 07/29/20 1401 07/29/20 2350 07/30/20 0534  GLUCAP 142* 137* 147* 154* 122*   Lipid Profile: Recent Labs    07/29/20 0325  TRIG 98   Thyroid Function Tests: No results for input(s): TSH, T4TOTAL, FREET4, T3FREE, THYROIDAB in the last 72 hours. Anemia Panel: No results for input(s): VITAMINB12, FOLATE, FERRITIN, TIBC, IRON, RETICCTPCT in  the last 72 hours. Sepsis Labs: No results for input(s): PROCALCITON, LATICACIDVEN in the last 168 hours.  Recent Results (from the past 240 hour(s))  Surgical pcr screen     Status: None   Collection Time: 07/22/20  9:46 AM   Specimen: Nasal Mucosa; Nasal Swab  Result Value Ref Range Status   MRSA, PCR NEGATIVE NEGATIVE Final   Staphylococcus aureus NEGATIVE NEGATIVE Final    Comment: (NOTE) The Xpert SA Assay (FDA approved for NASAL specimens in patients 35 years of age and older), is one component of a comprehensive surveillance program. It is not intended to diagnose infection nor to guide or monitor treatment. Performed at [facility], [place] [address] [place], [place] [address]          Radiology Studies: No results found.      Scheduled Meds: . Chlorhexidine Gluconate Cloth  6 each Topical Daily  . guaiFENesin  600 mg Oral BID  . insulin aspart  0-6 Units Subcutaneous Q8H  . lip balm  1 application Topical BID  . pantoprazole (PROTONIX) IV  40 mg Intravenous Q12H  . sodium chloride flush  10-40 mL  Intracatheter Q12H   Continuous Infusions: . methocarbamol (ROBAXIN) IV 1,000 mg (07/30/20 0543)  . TPN ADULT (ION) 70 mL/hr at 07/29/20 1737     LOS: 10 days    Time spent:25 mins. More than 50% of that time was spent in counseling and/or coordination of care.      Shelly Coss, MD [facility] P1/18/2022, 8:00 AM

## 2020-07-30 NOTE — Progress Notes (Signed)
Assessment & Plan: POD#8 - S/p exploratory laparotomy with LOA 07/22/20 Dr. Ninfa Linden -prolonged post-op ileus, NG stopped worked 1/17 and patient refused to have it replaced. Ok to leave it out. KUB w/ out gastric distention, there is a dilated loop of small bowel but a lot of air in colon. Pt denies nausea. Monitor and await return of bowel function - TNA, ice chips, ok with me if she has one popsicle or italian ice daily for comfort as long as she tolerates. -continue to mobilize, OOB, ambulate - encouraged         [name], [facility]       Office: [phone]   Chief Complaint: SBO  Subjective: Up in chair working with OT. Reports abdominal soreness. Denies flatus or BM. Denies nausea or vomiting.   Objective: Vital signs in last 24 hours: Temp:  [98 F (36.7 C)-99.1 F (37.3 C)] 98.2 F (36.8 C) (01/18 0536) Pulse Rate:  [86-93] 89 (01/18 0536) Resp:  [18] 18 (01/18 0536) BP: (112-131)/(59-77) 120/59 (01/18 0536) SpO2:  [98 %-100 %] 100 % (01/18 0536) Weight:  [60.1 kg] 60.1 kg (01/17 1700) Last BM Date:  (uta)  Intake/Output from previous day: 01/17 0701 - 01/18 0700 In: 2192.3 [P.O.:120; I.V.:1758.1; IV Piggyback:314.2] Out: 2200 [Urine:2200] Intake/Output this shift: No intake/output data recorded.  Physical Exam: HEENT - sclerae clear, mucous membranes moist Abdomen - soft, mild distention; present but hypoactive BS, honeycomb removed - staples are c/d/i without drainage or surrounding cellulitis. Ext - no edema, non-tender Neuro - alert & oriented, no focal deficits  Lab Results:  Recent Labs    07/28/20 0313 07/29/20 0325  WBC 9.9 10.2  HGB 8.7* 8.9*  HCT 25.8* 26.7*  PLT 212 259   BMET Recent Labs    07/29/20 0325 07/30/20 0333  NA 134* 134*  K 4.6 4.5  CL 98 98  CO2 26 27  GLUCOSE 138* 136*  BUN 12 14  CREATININE 0.46 0.47  CALCIUM 8.9 8.7*   PT/INR No results for input(s): LABPROT, INR  in the last 72 hours. Comprehensive Metabolic Panel:    Component Value Date/Time   NA 134 (L) 07/30/2020 0333   NA 134 (L) 07/29/2020 0325   K 4.5 07/30/2020 0333   K 4.6 07/29/2020 0325   CL 98 07/30/2020 0333   CL 98 07/29/2020 0325   CO2 27 07/30/2020 0333   CO2 26 07/29/2020 0325   BUN 14 07/30/2020 0333   BUN 12 07/29/2020 0325   CREATININE 0.47 07/30/2020 0333   CREATININE 0.46 07/29/2020 0325   GLUCOSE 136 (H) 07/30/2020 0333   GLUCOSE 138 (H) 07/29/2020 0325   CALCIUM 8.7 (L) 07/30/2020 0333   CALCIUM 8.9 07/29/2020 0325   AST 43 (H) 07/29/2020 0325   AST 20 07/27/2020 0334   ALT 17 07/29/2020 0325   ALT 10 07/27/2020 0334   ALKPHOS 53 07/29/2020 0325   ALKPHOS 24 (L) 07/27/2020 0334   BILITOT 0.1 (L) 07/29/2020 0325   BILITOT 0.2 (L) 07/27/2020 0334   PROT 6.1 (L) 07/29/2020 0325   PROT 4.8 (L) 07/27/2020 0334   ALBUMIN 2.6 (L) 07/29/2020 0325   ALBUMIN 2.0 (L) 07/27/2020 0334    Studies/Results: DG Abd Portable 1V  Result Date: 07/30/2020 CLINICAL DATA:  Follow-up ileus. EXAM: PORTABLE ABDOMEN - 1 VIEW COMPARISON:  One-view abdomen 07/26/2020. FINDINGS: Enteric tube was removed.  Skin staples remain. Single mildly dilated loop  of small bowel is present in the left central abdomen. Normal colonic gas is present. Bowel gas pattern is otherwise normal. Lung bases are clear. Atherosclerotic calcifications are present. IMPRESSION: Single mildly dilated loop of small bowel in the left central abdomen compatible with ileus. Electronically Signed   By: San Morelle M.D.   On: 07/30/2020 07:58      [name] 0/62/6948  Patient ID: Theresa Arnold, female   DOB: 1932-04-02, 87 y.o.   MRN: [id number]

## 2020-07-30 NOTE — Progress Notes (Addendum)
PHARMACY - TOTAL PARENTERAL NUTRITION CONSULT NOTE   Indication: Bowel obstruction  Patient Measurements: Height: 5' (152.4 cm) Weight: 60.1 kg (132 lb 7.9 oz) IBW/kg (Calculated) : 45.5 TPN AdjBW (KG): 49.4 Body mass index is 25.88 kg/m.  Assessment:  Theresa Arnold is an 87 yo female with PMH diverticulitis (s/p Hartmann procedure 03/22/19 with takedown on 09/04/19), HLD, COPD who presented to the hospital with abdominal pain and nausea/vomiting.  She had reported that she had not had any food for approximately 1 week prior to admission and was only drinking Gatorade and water. She also reported that she had no flatus or bowel movements also within 1 week.  Glucose / Insulin: range 137-154 (CBG goal < 150), 0 units SSI in past 24 hours  Electrolytes: WNL except Na 134 Renal: WNL  LFTs / TGs: WNL (1/15)/ 83 ( 1/11), 98 (1/17) Prealbumin / albumin: 6.7 (1/11), 23 (1/17) low, / albumin 2 (1/15) low    Intake / Output; MIVF: ~ 1250 UOP/24 hr, NG tube out, LR discontinued by [facility] on 1/11  GI Imaging: - 1/8 CT of abdomen shows mechanical small bowel obstruction with transition point in the right lower quadrant. Underlying adhesive disease is suspected. - 1/10 persistent SBO   Surgeries / Procedures:  - 1/10 to OR  for exploratory laparotomy   Central access: will obtain PICC after procedure on 1/10 per consult - PICC not obtained on 1/10, line finally placed 1/11, TPN started at 0939 1/11   TPN start date:  1/11  Nutritional Goals:  (per RD recommendation on 1/10):  Estimated Nutritional Needs:  Kcal:  1600-1800 Protein:  70-85g Fluid:  1.8L/day  Goal TPN rate is 70 mL/hr (provides 75.6  g of protein and 1777 kcals per day) Cyclic rate: 7T-2W 65 ml/hr  7p - 7a 129 ml/hr  7a-8a 65 ml/hr  Off 8a-6p Current Nutrition:  NPO, ice chips  Plan:   Changing to cyclic TPN with next bag today at 1800   Electrolytes in TPN:   8mEq/L of Na, 70 mEq/L of K, 17mEq/L of Ca, 8 mEq/L of Mg, 15 mmol/L of Phos. Cl:Ac ratio 1:1  Add thiamine 100 mg/L and chromium   Add standard MVI and trace elements to TPN  Reduce Very Sensitive SSI to 6p,2a,10a  Monitor TPN labs on Mon/Thurs  BMET in am  Minda Ditto PharmD 07/30/2020, 7:25 AM

## 2020-07-31 ENCOUNTER — Inpatient Hospital Stay (HOSPITAL_COMMUNITY): Payer: Medicare Other

## 2020-07-31 LAB — BASIC METABOLIC PANEL
Anion gap: 8 (ref 5–15)
BUN: 19 mg/dL (ref 8–23)
CO2: 25 mmol/L (ref 22–32)
Calcium: 8.7 mg/dL — ABNORMAL LOW (ref 8.9–10.3)
Chloride: 102 mmol/L (ref 98–111)
Creatinine, Ser: 0.48 mg/dL (ref 0.44–1.00)
GFR, Estimated: >60 mL/min (ref >60)
Glucose, Bld: 137 mg/dL — ABNORMAL HIGH (ref 70–99)
Potassium: 4.7 mmol/L (ref 3.5–5.1)
Sodium: 135 mmol/L (ref 135–145)

## 2020-07-31 LAB — GLUCOSE, CAPILLARY
Glucose-Capillary: 128 mg/dL — ABNORMAL HIGH (ref 70–99)
Glucose-Capillary: 102 mg/dL — ABNORMAL HIGH (ref 70–99)
Glucose-Capillary: 83 mg/dL (ref 70–99)
Glucose-Capillary: 152 mg/dL — ABNORMAL HIGH (ref 70–99)

## 2020-07-31 MED ORDER — INSULIN ASPART 100 UNIT/ML ~~LOC~~ SOLN
0.0000 [IU] | Freq: Four times a day (QID) | SUBCUTANEOUS | Status: DC
  Administered 2020-07-31 – 2020-08-02 (×2): 1 [IU] via SUBCUTANEOUS

## 2020-07-31 MED ORDER — POLYETHYLENE GLYCOL 3350 17 G PO PACK: 17.0000 g | PACK | Freq: Every day | ORAL | Status: DC

## 2020-07-31 MED ORDER — TRACE MINERALS CU-MN-SE-ZN 300-55-60-3000 MCG/ML IV SOLN
INTRAVENOUS | Status: AC
  Filled 2020-07-31: qty 526.4

## 2020-07-31 MED ORDER — TRAVASOL 10 % IV SOLN
INTRAVENOUS | Status: DC
  Filled 2020-07-31: qty 789.6

## 2020-07-31 NOTE — Progress Notes (Addendum)
PHARMACY - TOTAL PARENTERAL NUTRITION CONSULT NOTE   Indication: Bowel obstruction  Patient Measurements: Height: 5' (152.4 cm) Weight: 60.1 kg (132 lb 7.9 oz) IBW/kg (Calculated) : 45.5 TPN AdjBW (KG): 49.4 Body mass index is 25.88 kg/m.  Assessment:  [name] is an 87 yo female with hx of diverticulitis (s/p Hartmann procedure 03/22/19 with takedown on 09/04/19) presented to the hospital with abdominal pain and nausea/vomiting.  She had reported that she had not had any food for approximately 1 week prior to admission and was only drinking Gatorade and water. She also reported that she had no flatus or bowel movements also within 1 week.  Abd CT showed SBO and underwent exp lap with lysis of adhesions on 1/10.  Pharmacy consulted to start TPN on 1/11 for SBO.  Glucose / Insulin: sSSI (has not used any insulin in 24 hrs) - CBG (goal <150): wnl Electrolytes: all lytes wnl Renal: stable (crcl~41) LFTs / TGs: LFTs wnl; TG 83 ( 1/11), 98 (1/17) Prealbumin / albumin: 6.7 (1/11), 23 (1/17) low, / albumin 2 (1/15) low  Intake / Output; MIVF: UOP 1.5 ml/kg/hr; I/O -82 GI Imaging: - 1/8 CT of abdomen shows mechanical small bowel obstruction with transition point in the right lower quadrant. Underlying adhesive disease is suspected. - 1/10 persistent SBO   Surgeries / Procedures:  - 1/10 to OR  for exploratory laparotomy with LOA  Central access: will obtain PICC after procedure on 1/10 per consult - PICC not obtained on 1/10, line finally placed 1/11, TPN started at 0939 1/11   TPN start date:  1/11  Nutritional Goals:  (per RD recommendation on 1/17): Kcal:  1600-1800 Protein:  70-85g Fluid:  1.8L/day  Goal TPN rate is 70 mL/hr (provides 79  g of protein and 1612 kcals per day) - now cycling for 14 hr/day (total volume 1680 ml)  Current Nutrition:  NPO, ice chips   Plan:   Continue 14 hr cyclic TPN  Electrolytes in TPN:  21mEq/L of Na, 70 mEq/L of K, 46mEq/L of Ca, 8 mEq/L  of Mg, 15 mmol/L of Phos. Cl:Ac ratio 1:1  Add standard MVI and trace elements to TPN  Very Sensitive SSI q6h at 0100, 0900, 1300, 2000  Monitor TPN labs on Mon/Thurs   Lynelle Doctor PharmD 07/31/2020, 6:53 AM

## 2020-07-31 NOTE — Progress Notes (Signed)
Assessment & Plan: POD#9 - S/p exploratory laparotomy with LOA 07/22/20 Dr. Ninfa Linden -prolonged post-op ileus, NG stopped working 1/17 and patient refused to have it replaced. Ok to leave it out.  Monitor and await return of bowel function. Reports mild nausea, no emesis. Mobilizing more and getting up to urinate. Re-check KUB this AM.  - TNA, ice chips, ok with me if she has one popsicle or italian ice daily for comfort as long as she tolerates. -continue to mobilize, OOB, ambulate - encouraged         [name], [facility]       Office: [phone]   Chief Complaint: SBO  Subjective: Laying in bed. NAD. Denies new abd pain or vomiting. Denies flatus or BM.   Objective: Vital signs in last 24 hours: Temp:  [98.1 F (36.7 C)-98.6 F (37 C)] 98.1 F (36.7 C) (01/19 0500) Pulse Rate:  [85-91] 91 (01/19 0500) Resp:  [16-18] 17 (01/19 0500) BP: (96-126)/(51-74) 96/58 (01/19 0500) SpO2:  [98 %-100 %] 98 % (01/19 0500) Last BM Date:  (uta)  Intake/Output from previous day: 01/18 0701 - 01/19 0700 In: 2068 [I.V.:1582.1; IV Piggyback:485.9] Out: 2150 [Urine:2150] Intake/Output this shift: No intake/output data recorded.  Physical Exam: HEENT - sclerae clear, mucous membranes moist Abdomen - soft, mild distention; present but hypoactive, tinkering BS, staples are c/d/i without drainage or surrounding cellulitis. Ext - no edema, non-tender Neuro - alert & oriented, no focal deficits  Lab Results:  Recent Labs    07/29/20 0325  WBC 10.2  HGB 8.9*  HCT 26.7*  PLT 259   BMET Recent Labs    07/30/20 0333 07/31/20 0414  NA 134* 135  K 4.5 4.7  CL 98 102  CO2 27 25  GLUCOSE 136* 137*  BUN 14 19  CREATININE 0.47 0.48  CALCIUM 8.7* 8.7*   PT/INR No results for input(s): LABPROT, INR in the last 72 hours. Comprehensive Metabolic Panel:    Component Value Date/Time   NA 135 07/31/2020 0414   NA 134 (L) 07/30/2020 0333    K 4.7 07/31/2020 0414   K 4.5 07/30/2020 0333   CL 102 07/31/2020 0414   CL 98 07/30/2020 0333   CO2 25 07/31/2020 0414   CO2 27 07/30/2020 0333   BUN 19 07/31/2020 0414   BUN 14 07/30/2020 0333   CREATININE 0.48 07/31/2020 0414   CREATININE 0.47 07/30/2020 0333   GLUCOSE 137 (H) 07/31/2020 0414   GLUCOSE 136 (H) 07/30/2020 0333   CALCIUM 8.7 (L) 07/31/2020 0414   CALCIUM 8.7 (L) 07/30/2020 0333   AST 43 (H) 07/29/2020 0325   AST 20 07/27/2020 0334   ALT 17 07/29/2020 0325   ALT 10 07/27/2020 0334   ALKPHOS 53 07/29/2020 0325   ALKPHOS 24 (L) 07/27/2020 0334   BILITOT 0.1 (L) 07/29/2020 0325   BILITOT 0.2 (L) 07/27/2020 0334   PROT 6.1 (L) 07/29/2020 0325   PROT 4.8 (L) 07/27/2020 0334   ALBUMIN 2.6 (L) 07/29/2020 0325   ALBUMIN 2.0 (L) 07/27/2020 0334    Studies/Results: DG Abd Portable 1V  Result Date: 07/30/2020 CLINICAL DATA:  Follow-up ileus. EXAM: PORTABLE ABDOMEN - 1 VIEW COMPARISON:  One-view abdomen 07/26/2020. FINDINGS: Enteric tube was removed.  Skin staples remain. Single mildly dilated loop of small bowel is present in the left central abdomen. Normal colonic gas is present. Bowel gas pattern is otherwise normal. Lung bases are clear.  Atherosclerotic calcifications are present. IMPRESSION: Single mildly dilated loop of small bowel in the left central abdomen compatible with ileus. Electronically Signed   By: San Morelle M.D.   On: 07/30/2020 07:58      [name] 2/63/3354  Patient ID: Theresa Arnold, female   DOB: [birth year], 87 y.o.   MRN: [id number]

## 2020-07-31 NOTE — Progress Notes (Signed)
PROGRESS NOTE    Theresa Arnold  VFI:[id number] DOB: 08-10-1931 DOA: 07/19/2020 PCP: Biagio Borg, MD   Chief Complain: Abdominal pain  Brief Narrative:  Theresa Arnold is an 87 yo female with PMH diverticulitis (s/p Hartmann procedure 03/22/19 with takedown on 09/04/19), hyperlipidemia, COPD presented to hospital with complaints of abdominal pain nausea and vomiting. NG tube attempt was made in the ER however unsuccessful and IR guided placement was ordered.  CT scan of the abdomen pelvis showed small bowel obstruction so general surgery was consulted.  Patient had minimal improvement with bowel rest, NGT placement and was taken for exploratory laparotomy on 07/22/2020. She underwent lysis of adhesions and tolerated procedure well. Due to her prolonged n.p.o. intake while awaiting return of bowel function, she was considered for TPN and PICC line placement due to expected postop ileus.   Surgery following the patient during hospitalization due to prolonged ileus.  No bowel movement yet  Assessment & Plan:   Principal Problem:   SBO (small bowel obstruction) (HCC) Active Problems:   Diverticulosis of colon   COPD (chronic obstructive pulmonary disease) (HCC)   Thrombocytopenia (HCC)   Hypokalemia   Macrocytosis without anemia   Small bowel obstruction/postoperative ileus: Patient underwent expiratory laparotomy on 07/22/2018 with lysis of adhesions.  History of Hartman's procedure and reversal in the past. Prolonged postoperative ileus.  Still awaiting bowel function return.   NG tube came out by itself on 07/30/19,new one not put.  Has been started on TPN through PICC line. On IV Protonix.  Continue pain management, supportive care, IV fluids.She is passing gas now. Plan is to give miralax today  Macrocytic anemia: Vitamin C16 and folic acid normal.  Currently hemoglobin is stable.  Continue monitoring.  Thrombocytopenia: Resolved  Hypokalemia/hypophosphatemia/hypomagnesemia: Continue  to monitor and supplement as necessary.  History of COPD: Currently respiratory status stable.  On room air.  Continue bronchodilators as needed.  History of diverticulosis of colon: Status post Hartman's procedure for obstructed diverticulitis in the past.  Status post reversal  Protein calorie malnutrition: Currently on TPN.  Nutrition following  Deconditioning/debility: PT recommended SNF  on discharge.  TOC consulted   Nutrition Problem: Inadequate oral intake Etiology: nausea,vomiting (SBO)      DVT prophylaxis:SCD Code Status: Full Family Communication: called son on phone for update on 07/31/20 Status is: Inpatient  Remains inpatient appropriate because:Inpatient level of care appropriate due to severity of illness   Dispo: The patient is from: Home              Anticipated d/c is to: SNF              Anticipated d/c date is:Not known at present, awaiting bowel function return              Patient currently is not medically stable to d/c.     Consultants: Surgery  Procedures:None  Antimicrobials:  Anti-infectives (From admission, onward)   Start     Dose/Rate Route Frequency Ordered Stop   07/22/20 0915  ceFAZolin (ANCEF) IVPB 2g/100 mL premix        2 g 200 mL/hr over 30 Minutes Intravenous On call to O.R. 07/22/20 0908 07/22/20 1204      Subjective: Patient seen and examined at the bedside this morning.  Overall, she looked comfortable.  She was sitting on the chair.  Denies any abdomen pain, nausea or vomiting.  She has started passing some gas.  Bowel sounds heard.   Objective: Vitals:  07/30/20 0536 07/30/20 1305 07/30/20 2054 07/31/20 0500  BP: (!) 120/59 (!) 103/51 126/74 (!) 96/58  Pulse: 89 85 90 91  Resp: 18 16 18 17   Temp: 98.2 F (36.8 C) 98.6 F (37 C) 98.4 F (36.9 C) 98.1 F (36.7 C)  TempSrc: Oral Oral Oral   SpO2: 100% 100% 98% 98%  Weight:      Height:        Intake/Output Summary (Last 24 hours) at 07/31/2020 0746 Last data  filed at 07/31/2020 0700 Gross per 24 hour  Intake 2068.01 ml  Output 2150 ml  Net -81.99 ml   Filed Weights   07/19/20 1511 07/22/20 1043 07/29/20 1700  Weight: 61.2 kg 55.9 kg 60.1 kg    Examination:   General exam: Pleasant elderly female, not in distress Respiratory system: Bilateral equal air entry, normal vesicular breath sounds, no wheezes or crackles  Cardiovascular system: S1 & S2 heard, RRR. No JVD, murmurs, rubs, gallops or clicks. Gastrointestinal system: Abdomen is mildly distended, soft and apporiately.Slow bowel sounds heard. Central nervous system: Alert and oriented. No focal neurological deficits. Extremities: No edema, no clubbing ,no cyanosis Skin: No rashes, lesions or ulcers,no icterus ,no pallor   Data Reviewed: I have personally reviewed following labs and imaging studies  CBC: Recent Labs  Lab 07/25/20 0440 07/26/20 0407 07/28/20 0313 07/29/20 0325  WBC 8.1 6.4 9.9 10.2  NEUTROABS 5.8  --   --  7.7  HGB 10.4* 10.3* 8.7* 8.9*  HCT 30.9* 30.9* 25.8* 26.7*  MCV 108.8* 108.8* 109.8* 109.4*  PLT 149* 176 212 [id number]   Basic Metabolic Panel: Recent Labs  Lab 07/26/20 0407 07/27/20 0334 07/28/20 0313 07/29/20 0325 07/30/20 0333 07/31/20 0414  NA 136 136 133* 134* 134* 135  K 4.0 4.6 4.6 4.6 4.5 4.7  CL 100 104 99 98 98 102  CO2 26 26 26 26 27 25   GLUCOSE 131* 144* 140* 138* 136* 137*  BUN 10 13 10 12 14 19   CREATININE 0.34* 0.38* 0.40* 0.46 0.47 0.48  CALCIUM 7.8* 7.9* 8.4* 8.9 8.7* 8.7*  MG 2.1 2.2 2.1 2.1 2.1  --   PHOS 2.6 3.1 4.0 4.6 4.1  --    GFR: Estimated Creatinine Clearance: 40.9 mL/min (by C-G formula based on SCr of 0.48 mg/dL). Liver Function Tests: Recent Labs  Lab 07/25/20 0440 07/26/20 0407 07/27/20 0334 07/29/20 0325  AST 26 23 20  43*  ALT 12 11 10 17   ALKPHOS 26* 30* 24* 53  BILITOT 0.5 0.1* 0.2* 0.1*  PROT 4.7* 5.2* 4.8* 6.1*  ALBUMIN 2.2* 2.3* 2.0* 2.6*   No results for input(s): LIPASE, AMYLASE in the last  168 hours. No results for input(s): AMMONIA in the last 168 hours. Coagulation Profile: No results for input(s): INR, PROTIME in the last 168 hours. Cardiac Enzymes: No results for input(s): CKTOTAL, CKMB, CKMBINDEX, TROPONINI in the last 168 hours. BNP (last 3 results) No results for input(s): PROBNP in the last 8760 hours. HbA1C: No results for input(s): HGBA1C in the last 72 hours. CBG: Recent Labs  Lab 07/30/20 0534 07/30/20 1141 07/30/20 1833 07/30/20 2353 07/31/20 0217  GLUCAP 122* 120* 124* 133* 128*   Lipid Profile: Recent Labs    07/29/20 0325  TRIG 98   Thyroid Function Tests: No results for input(s): TSH, T4TOTAL, FREET4, T3FREE, THYROIDAB in the last 72 hours. Anemia Panel: No results for input(s): VITAMINB12, FOLATE, FERRITIN, TIBC, IRON, RETICCTPCT in the last 72 hours. Sepsis Labs: No results for  input(s): PROCALCITON, LATICACIDVEN in the last 168 hours.  Recent Results (from the past 240 hour(s))  Surgical pcr screen     Status: None   Collection Time: 07/22/20  9:46 AM   Specimen: Nasal Mucosa; Nasal Swab  Result Value Ref Range Status   MRSA, PCR NEGATIVE NEGATIVE Final   Staphylococcus aureus NEGATIVE NEGATIVE Final    Comment: (NOTE) The Xpert SA Assay (FDA approved for NASAL specimens in patients 41 years of age and older), is one component of a comprehensive surveillance program. It is not intended to diagnose infection nor to guide or monitor treatment. Performed at [facility], [place] [address] [place], [place] [address]          Radiology Studies: DG Abd Portable 1V  Result Date: 07/30/2020 CLINICAL DATA:  Follow-up ileus. EXAM: PORTABLE ABDOMEN - 1 VIEW COMPARISON:  One-view abdomen 07/26/2020. FINDINGS: Enteric tube was removed.  Skin staples remain. Single mildly dilated loop of small bowel is present in the left central abdomen. Normal colonic gas is present. Bowel gas pattern is otherwise normal. Lung bases are  clear. Atherosclerotic calcifications are present. IMPRESSION: Single mildly dilated loop of small bowel in the left central abdomen compatible with ileus. Electronically Signed   By: San Morelle M.D.   On: 07/30/2020 07:58        Scheduled Meds: . Chlorhexidine Gluconate Cloth  6 each Topical Daily  . guaiFENesin  600 mg Oral BID  . insulin aspart  0-6 Units Subcutaneous 3 times per day  . lip balm  1 application Topical BID  . pantoprazole (PROTONIX) IV  40 mg Intravenous Q12H  . sodium chloride flush  10-40 mL Intracatheter Q12H   Continuous Infusions: . methocarbamol (ROBAXIN) IV 1,000 mg (07/31/20 0457)  . TPN CYCLIC-ADULT (ION) 65 mL/hr at 07/31/20 0630     LOS: 11 days    Time spent:25 mins. More than 50% of that time was spent in counseling and/or coordination of care.      Shelly Coss, MD [facility] P1/19/2022, 7:46 AM

## 2020-07-31 NOTE — Progress Notes (Signed)
Physical Therapy Treatment Patient Details Name: Theresa Arnold MRN: [id number] DOB: 1932/01/28 Today's Date: 07/31/2020    History of Present Illness 87 y.o. female with medical history significant for diverticulitis which required colostomy in past (colostomy has been reversed) who presents for evaluation of abdominal pain with persistent nausea and vomiting. Dx of SBO. s/p exploratory laparoscopy with LOA on 07/22/20.    PT Comments    Pt tolerated increased activity level today, she ambulated 22' with RW, distance limited by abdominal pain/fatigue.   Follow Up Recommendations  SNF (if family declines placement, then HHPT)     Equipment Recommendations  None recommended by PT    Recommendations for Other Services       Precautions / Restrictions Precautions Precautions: Fall Precaution Comments: abdominal incision Restrictions Weight Bearing Restrictions: No    Mobility  Bed Mobility Overal bed mobility: Needs Assistance         Sit to supine: Mod assist   General bed mobility comments: assist for LEs into bed  Transfers Overall transfer level: Needs assistance Equipment used: Rolling walker (2 wheeled) Transfers: Sit to/from Stand Sit to Stand: Min assist         General transfer comment: Assist to power up, stabilize, control descent. VCs for hand placement. Mild posterior lean upon standing.  Ambulation/Gait Ambulation/Gait assistance: Min assist;Min guard Gait Distance (Feet): 40 Feet Assistive device: Rolling walker (2 wheeled) Gait Pattern/deviations: Step-through pattern;Decreased stride length Gait velocity: decr   General Gait Details: distance limited by abdominal pain and generalized fatigue, followed with recliner   Stairs             Wheelchair Mobility    Modified Rankin (Stroke Patients Only)       Balance Overall balance assessment: Needs assistance         Standing balance support: Bilateral upper extremity  supported Standing balance-Leahy Scale: Poor                              Cognition Arousal/Alertness: Awake/alert Behavior During Therapy: WFL for tasks assessed/performed Overall Cognitive Status: Within Functional Limits for tasks assessed                                        Exercises      General Comments        Pertinent Vitals/Pain Pain Score: 7  Pain Location: abdomen with walking Pain Descriptors / Indicators: Grimacing;Guarding Pain Intervention(s): Limited activity within patient's tolerance;Monitored during session;Repositioned    Home Living                      Prior Function            PT Goals (current goals can now be found in the care plan section) Acute Rehab PT Goals Patient Stated Goal: return to independence with mobility Time For Goal Achievement: 08/06/20 Potential to Achieve Goals: Good Progress towards PT goals: Progressing toward goals    Frequency    Min 3X/week      PT Plan Current plan remains appropriate    Co-evaluation              AM-PAC PT "6 Clicks" Mobility   Outcome Measure  Help needed turning from your back to your side while in a flat bed without using bedrails?: A Lot Help needed  moving from lying on your back to sitting on the side of a flat bed without using bedrails?: A Lot Help needed moving to and from a bed to a chair (including a wheelchair)?: A Little Help needed standing up from a chair using your arms (e.g., wheelchair or bedside chair)?: A Little Help needed to walk in hospital room?: A Little Help needed climbing 3-5 steps with a railing? : A Lot 6 Click Score: 15    End of Session Equipment Utilized During Treatment: Gait belt Activity Tolerance: Patient limited by fatigue Patient left: with call bell/phone within reach;in bed Nurse Communication: Mobility status;Patient requests pain meds PT Visit Diagnosis: Muscle weakness (generalized)  (M62.81);Difficulty in walking, not elsewhere classified (R26.2) Pain - part of body:  (abdomen)     Time: 7564-3329 PT Time Calculation (min) (ACUTE ONLY): 20 min  Charges:  $Gait Training: 8-22 mins                     Blondell Reveal Kistler PT 07/31/2020  Acute Rehabilitation Services Pager [phone] Office [phone]

## 2020-07-31 NOTE — TOC Initial Note (Signed)
Transition of Care [facility]) - Initial/Assessment Note    Patient Details  Name: Theresa Arnold MRN: [id number] Date of Birth: May 03, 1932  Transition of Care [facility]) CM/SW Contact:    Lia Hopping, [place] Phone Number: 07/31/2020, 3:57 PM  Clinical Narrative:    Patient admitted for persistent nausea and vomiting. Patient found to have SBO/post operative ileus.               CSW met with the patient at bedside to discuss disposition SNF vs. Home. Patient reports she does not want to go to SNF. She lives at home with spouse. Patient reports prior to admitting to the hospital she was independent with her ADL's and did not use a device to walk. Patient reports having HHPT/OT in the past however she cannot recall the agency.  CSW will follow up with an agency list and offer choice.  Patient has DME-RW and 3 in1  Expected Discharge Plan: [place]     Patient Goals and CMS Choice Patient states their goals for this hospitalization and ongoing recovery are:: return home CMS Medicare.gov Compare Post Acute Care list provided to:: Patient Choice offered to / list presented to : Patient  Expected Discharge Plan and Services Expected Discharge Plan: [place] In-house Referral: Clinical Social Work Discharge Planning Services: CM Consult Post Acute Care Choice: [place] arrangements for the past 2 months: Single Family Home                                      Prior Living Arrangements/Services Living arrangements for the past 2 months: Single Family Home Lives with:: Spouse Patient language and need for interpreter reviewed:: No Do you feel safe going back to the place where you live?: Yes      Need for Family Participation in Patient Care: Yes (Comment) Care giver support system in place?: Yes (comment) Current home services: DME Criminal Activity/Legal Involvement Pertinent to Current Situation/Hospitalization: No - Comment as  needed  Activities of Daily Living Home Assistive Devices/Equipment: None ADL Screening (condition at time of admission) Patient's cognitive ability adequate to safely complete daily activities?: Yes Is the patient deaf or have difficulty hearing?: Yes Does the patient have difficulty seeing, even when wearing glasses/contacts?: No Does the patient have difficulty concentrating, remembering, or making decisions?: No Patient able to express need for assistance with ADLs?: Yes Does the patient have difficulty dressing or bathing?: No Independently performs ADLs?: Yes (appropriate for developmental age) Does the patient have difficulty walking or climbing stairs?: No Weakness of Legs: None Weakness of Arms/Hands: None  Permission Sought/Granted Permission sought to share information with : Family Chief Financial Officer Permission granted to share information with : Yes, Verbal Permission Granted     Permission granted to share info w AGENCY: [place] in the area        Emotional Assessment Appearance:: Appears stated age Attitude/Demeanor/Rapport: Engaged Affect (typically observed): Accepting Orientation: : Oriented to Self,Oriented to Place,Oriented to  Time,Oriented to Situation Alcohol / Substance Use: Not Applicable Psych Involvement: No (comment)  Admission diagnosis:  Small bowel obstruction ([place]) [K56.609] SBO (small bowel obstruction) ([place]) [K56.609] Encounter for imaging study to confirm nasogastric (NG) tube placement [Z01.89] Patient Active Problem List   Diagnosis Date Noted  . SBO (small bowel obstruction) ([place]) 07/20/2020  . Thrombocytopenia ([place]) 07/20/2020  . Nausea and vomiting  07/20/2020  . Hypokalemia 07/20/2020  . Macrocytosis without anemia 07/20/2020  . S/P colostomy takedown 09/04/2019  . Nausea 06/23/2019  . Abnormal LFTs 06/11/2019  . Acute blood loss anemia 06/11/2019  . Hypoxia 03/24/2019  . Colonic partial  obstruction from sigmoid stricture 03/18/2019  . Polyp of sigmoid colon 03/18/2019  . Oral thrush 03/18/2019  . Abnormal CT scan, gastrointestinal tract   . Abnormal CT scan, sigmoid colon   . Diverticulitis of sigmoid colon 03/15/2019  . LLQ pain 11/02/2018  . Abdominal pain 09/21/2018  . Venous (peripheral) insufficiency 01/12/2014  . Sprain of ankle, unspecified site 06/28/2013  . Preventative health care 11/10/2011  . Cervical spondylosis 11/10/2011  . OA (osteoarthritis) of knee 11/10/2011  . COPD (chronic obstructive pulmonary disease) ([place]) 11/10/2011  . Dizziness 11/10/2011  . Benign neoplasm of colon 11/24/2010  . Diverticulosis of colon 11/24/2010  . Encounter for long-term (current) use of other medications 10/07/2010  . HLD (hyperlipidemia) 09/03/2010  . FATIGUE 09/03/2010  . SMOKER 09/10/2009   PCP:  Biagio Borg, MD Pharmacy:   [id number] [place]Phone: [phone]: [phone]    Social Determinants of Health (SDOH) Interventions    Readmission Risk Interventions No flowsheet data found.

## 2020-08-01 LAB — COMPREHENSIVE METABOLIC PANEL
ALT: 32 U/L (ref 0–44)
AST: 43 U/L — ABNORMAL HIGH (ref 15–41)
Albumin: 2.7 g/dL — ABNORMAL LOW (ref 3.5–5.0)
Alkaline Phosphatase: 75 U/L (ref 38–126)
Anion gap: 8 (ref 5–15)
BUN: 17 mg/dL (ref 8–23)
CO2: 23 mmol/L (ref 22–32)
Calcium: 8.8 mg/dL — ABNORMAL LOW (ref 8.9–10.3)
Chloride: 104 mmol/L (ref 98–111)
Creatinine, Ser: 0.44 mg/dL (ref 0.44–1.00)
GFR, Estimated: >60 mL/min (ref >60)
Glucose, Bld: 152 mg/dL — ABNORMAL HIGH (ref 70–99)
Potassium: 4.6 mmol/L (ref 3.5–5.1)
Sodium: 135 mmol/L (ref 135–145)
Total Bilirubin: 0.4 mg/dL (ref 0.3–1.2)
Total Protein: 6.2 g/dL — ABNORMAL LOW (ref 6.5–8.1)

## 2020-08-01 LAB — CBC WITH DIFFERENTIAL/PLATELET
Abs Immature Granulocytes: 0.09 10*3/uL — ABNORMAL HIGH (ref 0.00–0.07)
Basophils Absolute: 0.1 10*3/uL (ref 0.0–0.1)
Basophils Relative: 1 %
Eosinophils Absolute: 0.1 10*3/uL (ref 0.0–0.5)
Eosinophils Relative: 1 %
HCT: 25.1 % — ABNORMAL LOW (ref 36.0–46.0)
Hemoglobin: 8.2 g/dL — ABNORMAL LOW (ref 12.0–15.0)
Immature Granulocytes: 1 %
Lymphocytes Relative: 14 %
Lymphs Abs: 1.4 10*3/uL (ref 0.7–4.0)
MCH: 36.4 pg — ABNORMAL HIGH (ref 26.0–34.0)
MCHC: 32.7 g/dL (ref 30.0–36.0)
MCV: 111.6 fL — ABNORMAL HIGH (ref 80.0–100.0)
Monocytes Absolute: 0.7 10*3/uL (ref 0.1–1.0)
Monocytes Relative: 6 %
Neutro Abs: 8 10*3/uL — ABNORMAL HIGH (ref 1.7–7.7)
Neutrophils Relative %: 77 %
Platelets: 299 10*3/uL (ref 150–400)
RBC: 2.25 MIL/uL — ABNORMAL LOW (ref 3.87–5.11)
RDW: 14.6 % (ref 11.5–15.5)
WBC: 10.4 10*3/uL (ref 4.0–10.5)
nRBC: 0 % (ref 0.0–0.2)

## 2020-08-01 LAB — GLUCOSE, CAPILLARY
Glucose-Capillary: 109 mg/dL — ABNORMAL HIGH (ref 70–99)
Glucose-Capillary: 137 mg/dL — ABNORMAL HIGH (ref 70–99)
Glucose-Capillary: 157 mg/dL — ABNORMAL HIGH (ref 70–99)
Glucose-Capillary: 94 mg/dL (ref 70–99)

## 2020-08-01 LAB — PHOSPHORUS: Phosphorus: 3.7 mg/dL (ref 2.5–4.6)

## 2020-08-01 LAB — MAGNESIUM: Magnesium: 2.2 mg/dL (ref 1.7–2.4)

## 2020-08-01 MED ORDER — DOCUSATE SODIUM 100 MG PO CAPS
100.0000 mg | ORAL_CAPSULE | Freq: Two times a day (BID) | ORAL | Status: DC
  Administered 2020-08-02: 09:00:00 100 mg via ORAL
  Filled 2020-08-01 (×4): qty 1

## 2020-08-01 MED ORDER — TRACE MINERALS CU-MN-SE-ZN 300-55-60-3000 MCG/ML IV SOLN
INTRAVENOUS | Status: AC
  Filled 2020-08-01: qty 526.4

## 2020-08-01 MED ORDER — POLYETHYLENE GLYCOL 3350 17 G PO PACK
17.0000 g | PACK | Freq: Two times a day (BID) | ORAL | Status: DC
  Administered 2020-08-01 – 2020-08-03 (×4): 17 g via ORAL
  Filled 2020-08-01 (×5): qty 1

## 2020-08-01 MED ORDER — TRAVASOL 10 % IV SOLN
INTRAVENOUS | Status: DC
  Filled 2020-08-01: qty 789.6

## 2020-08-01 NOTE — Progress Notes (Signed)
Assessment & Plan: POD#10 - S/p exploratory laparotomy with LOA 07/22/20 Dr. Ninfa Linden -prolonged post-op ileus, NG stopped working 1/17 and patient refused to have it replaced. Ok to leave it out.  - had a large, non-bloody BM last night and having small amts flatus. abd softer. Trial CLD. Failure to tolerate PO, or if does not continue to have flatus and ongoing BMs, will likely result in repeat CT abd/pelvis to better delineate cause of delayed post-op return of bowel function. - TNA, start sips of clears today -continue to mobilize, OOB, ambulate - encouraged         [name], [facility]       Office: [phone]   Chief Complaint: SBO  Subjective: Up to bedside commode. Reports having a BM last night that was large and non-bloody. Having some flatus. Denies nausea or vomiting.  Objective: Vital signs in last 24 hours: Temp:  [97.8 F (36.6 C)-99 F (37.2 C)] 97.9 F (36.6 C) (01/20 0518) Pulse Rate:  [75-89] 89 (01/20 0518) Resp:  [16-18] 16 (01/20 0518) BP: (101-114)/(61-67) 101/61 (01/20 0518) SpO2:  [98 %] 98 % (01/20 0518) Last BM Date:  (uta)  Intake/Output from previous day: 01/19 0701 - 01/20 0700 In: 100 [IV Piggyback:100] Out: 900 [Urine:900] Intake/Output this shift: No intake/output data recorded.  Physical Exam: HEENT - sclerae clear, mucous membranes moist Abdomen - soft, mild distention - improved compared to previous;  staples are c/d/i without drainage or surrounding cellulitis. Ext - no edema, non-tender Neuro - alert & oriented, no focal deficits  Lab Results:  Recent Labs    08/01/20 0341  WBC 10.4  HGB 8.2*  HCT 25.1*  PLT 299   BMET Recent Labs    07/31/20 0414 08/01/20 0341  NA 135 135  K 4.7 4.6  CL 102 104  CO2 25 23  GLUCOSE 137* 152*  BUN 19 17  CREATININE 0.48 0.44  CALCIUM 8.7* 8.8*   PT/INR No results for input(s): LABPROT, INR in the last 72 hours. Comprehensive  Metabolic Panel:    Component Value Date/Time   NA 135 08/01/2020 0341   NA 135 07/31/2020 0414   K 4.6 08/01/2020 0341   K 4.7 07/31/2020 0414   CL 104 08/01/2020 0341   CL 102 07/31/2020 0414   CO2 23 08/01/2020 0341   CO2 25 07/31/2020 0414   BUN 17 08/01/2020 0341   BUN 19 07/31/2020 0414   CREATININE 0.44 08/01/2020 0341   CREATININE 0.48 07/31/2020 0414   GLUCOSE 152 (H) 08/01/2020 0341   GLUCOSE 137 (H) 07/31/2020 0414   CALCIUM 8.8 (L) 08/01/2020 0341   CALCIUM 8.7 (L) 07/31/2020 0414   AST 43 (H) 08/01/2020 0341   AST 43 (H) 07/29/2020 0325   ALT 32 08/01/2020 0341   ALT 17 07/29/2020 0325   ALKPHOS 75 08/01/2020 0341   ALKPHOS 53 07/29/2020 0325   BILITOT 0.4 08/01/2020 0341   BILITOT 0.1 (L) 07/29/2020 0325   PROT 6.2 (L) 08/01/2020 0341   PROT 6.1 (L) 07/29/2020 0325   ALBUMIN 2.7 (L) 08/01/2020 0341   ALBUMIN 2.6 (L) 07/29/2020 0325    Studies/Results: DG Abd Portable 1V  Result Date: 07/31/2020 CLINICAL DATA:  Postoperative ileus EXAM: PORTABLE ABDOMEN - 1 VIEW COMPARISON:  July 30, 2020 FINDINGS: Skin staples again noted over the lower abdomen and pelvis. There is currently no appreciable bowel dilatation or air-fluid level to suggest bowel  obstruction. Contrast noted in the proximal colon. No free air. There is aortic atherosclerosis. IMPRESSION: There is no longer appreciable bowel dilatation. No findings suggesting ileus or bowel obstruction. No free air evident on supine examination. Skin staples noted. Aortic Atherosclerosis (ICD10-I70.0). Electronically Signed   By: Lowella Grip III M.D.   On: 07/31/2020 11:26      [name] 6/71/2458  Patient ID: Theresa Arnold, female   DOB: Aug 05, 1931, 87 y.o.   MRN: [id number]

## 2020-08-01 NOTE — Progress Notes (Addendum)
PHARMACY - TOTAL PARENTERAL NUTRITION CONSULT NOTE   Indication: Bowel obstruction  Patient Measurements: Height: 5' (152.4 cm) Weight: 60.1 kg (132 lb 7.9 oz) IBW/kg (Calculated) : 45.5 TPN AdjBW (KG): 49.4 Body mass index is 25.88 kg/m.  Assessment:  Theresa Arnold is an 87 yo female with hx of diverticulitis (s/p Hartmann procedure 03/22/19 with takedown on 09/04/19) presented to the hospital with abdominal pain and nausea/vomiting.  She had reported that she had not had any food for approximately 1 week prior to admission and was only drinking Gatorade and water. She also reported that she had no flatus or bowel movements also within 1 week.  Abd CT showed SBO and underwent exp lap with lysis of adhesions on 1/10.  Pharmacy consulted to start TPN on 1/11 for SBO.  Glucose / Insulin: sSSI (1 U SSI/24 hrs) - CBG 152-157 during 14 hr cyclic TPN, 83 while off TPN, acceptable Electrolytes: all lytes wnl Renal: stable (crcl~41) LFTs / TGs: LFTs wnl; TG 83 ( 1/11), 98 (1/17) Prealbumin / albumin: 6.7 (1/11), 23 (1/17) low, / albumin 2 (1/15) low  Intake / Output; MIVF:  UOP 1200 cc/24 hrs; I/O -1100, does not seem to record TPN GI Imaging: - 1/8 CT of abdomen shows mechanical small bowel obstruction with transition point in the right lower quadrant. Underlying adhesive disease is suspected. - 1/10 persistent SBO   1/19 KUB: no longer see bowel dilatation, no ileus, no SBO Surgeries / Procedures:  - 1/10 to OR  for exploratory laparotomy with LOA  Central access: - PICC not obtained on 1/10, line finally placed 1/11, TPN started at 0939 1/11   TPN start date:  1/11  Nutritional Goals:  (per RD recommendation on 1/17): Kcal:  1600-1800 Protein:  70-85g Fluid:  1.8L/day  Goal TPN rate is 70 mL/hr (provides 79  g of protein and 1612 kcals per day) - now cycling for 14 hr/day (total volume 1680 ml)  Current Nutrition:  NPO, ice chips   Plan:   Continue 14 hr cyclic TPN  Due to  lipid shortage, lipids only MWF, no lipids today  Electrolytes in TPN:  66mEq/L of Na, 70 mEq/L of K, 32mEq/L of Ca, 8 mEq/L of Mg, 15 mmol/L of Phos. Cl:Ac ratio 1:1  Add standard MVI and trace elements to TPN  Very Sensitive SSI q6h at 0100, 0900, 1300, 2000  Monitor TPN labs on Mon/Thurs   Eudelia Bunch, Pharm.D 08/01/2020 7:57 AM

## 2020-08-01 NOTE — Progress Notes (Signed)
PROGRESS NOTE    Theresa Arnold  WPY:[id number] DOB: 1932-02-26 DOA: 07/19/2020 PCP: Biagio Borg, MD   Chief Complain: Abdominal pain  Brief Narrative:  [name] is an 87 yo female with PMH diverticulitis (s/p Hartmann procedure 03/22/19 with takedown on 09/04/19), hyperlipidemia, COPD presented to hospital with complaints of abdominal pain nausea and vomiting. NG tube attempt was made in the ER however unsuccessful and IR guided placement was ordered.  CT scan of the abdomen pelvis showed small bowel obstruction so general surgery was consulted.  Patient had minimal improvement with bowel rest, NGT placement and was taken for exploratory laparotomy on 07/22/2020. She underwent lysis of adhesions and tolerated procedure well. Due to her prolonged n.p.o. intake while awaiting return of bowel function, she was started on  TPN after PICC line placement due to expected postop ileus.   Surgery following the patient during hospitalization due to prolonged ileus.  Finally had a bowel movement today.  Started on clear liquid diet.  Assessment & Plan:   Principal Problem:   SBO (small bowel obstruction) ([facility]) Active Problems:   Diverticulosis of colon   COPD (chronic obstructive pulmonary disease) ([facility])   Thrombocytopenia ([facility])   Hypokalemia   Macrocytosis without anemia   Small bowel obstruction/postoperative ileus: Patient underwent expiratory laparotomy on 07/22/2018 with lysis of adhesions.  History of Hartman's procedure and reversal in the past. Prolonged postoperative ileus.    NG tube came out by itself on 07/30/19,new one not put.  Started on TPN through PICC line.   Finally had a bowel movement today.  Surgery started on clear liquid diet.  Encouraged ambulation  Macrocytic anemia: Vitamin K53 and folic acid normal.  Currently hemoglobin is stable.  Continue monitoring.  Thrombocytopenia: Resolved  Hypokalemia/hypophosphatemia/hypomagnesemia: Continue to monitor and supplement  as necessary.  History of COPD: Currently respiratory status stable.  On room air.  Continue bronchodilators as needed.  History of diverticulosis of colon: Status post Hartman's procedure for obstructed diverticulitis in the past.  Status post reversal  Protein calorie malnutrition: Currently on TPN.  Nutrition following.  We will gradually wean the TPN off while being started on oral diet  Deconditioning/debility: PT recommended SNF  on discharge.  TOC consulted.family and patient interested on home health.  Will be discharged to home when cleared by general surgery.   Nutrition Problem: Inadequate oral intake Etiology: nausea,vomiting (SBO)      DVT prophylaxis:SCD Code Status: Full Family Communication: called son on phone for update on 07/31/20 Status is: Inpatient  Remains inpatient appropriate because:Inpatient level of care appropriate due to severity of illness   Dispo: The patient is from: Home              Anticipated d/c is to: Home with home health              Anticipated d/c date is: After cleared by general surgery for discharge     Consultants: Surgery  Procedures:None  Antimicrobials:  Anti-infectives (From admission, onward)   Start     Dose/Rate Route Frequency Ordered Stop   07/22/20 0915  ceFAZolin (ANCEF) IVPB 2g/100 mL premix        2 g 200 mL/hr over 30 Minutes Intravenous On call to O.R. 07/22/20 0908 07/22/20 1204      Subjective:  Patient seen and examined at the bedside this morning.  Hemodynamically stable.  Sitting on the chair.  She had a bowel movement today and she was very happy.  Denies any  abdominal pain, nausea or vomiting.  Abdomen still mildly distended.   Objective: Vitals:   07/31/20 0500 07/31/20 1349 07/31/20 2049 08/01/20 0518  BP: (!) 96/58 113/67 114/64 101/61  Pulse: 91 75 87 89  Resp: 17 18 16 16   Temp: 98.1 F (36.7 C) 97.8 F (36.6 C) 99 F (37.2 C) 97.9 F (36.6 C)  TempSrc:  Oral Oral Oral  SpO2: 98% 98%  98% 98%  Weight:      Height:        Intake/Output Summary (Last 24 hours) at 08/01/2020 0802 Last data filed at 08/01/2020 0200 Gross per 24 hour  Intake 100 ml  Output 900 ml  Net -800 ml   Filed Weights   07/19/20 1511 07/22/20 1043 07/29/20 1700  Weight: 61.2 kg 55.9 kg 60.1 kg    Examination:  General exam: Pleasant elderly female, comfortable Respiratory system: Bilateral equal air entry, normal vesicular breath sounds, no wheezes or crackles  Cardiovascular system: S1 & S2 heard, RRR. No JVD, murmurs, rubs, gallops or clicks. Gastrointestinal system: Abdomen is distended, soft and nontender.  Midline surgical wound  Central nervous system: Alert and oriented. No focal neurological deficits. Extremities: No edema, no clubbing ,no cyanosis Skin: No rashes, lesions or ulcers,no icterus ,no pallor   Data Reviewed: I have personally reviewed following labs and imaging studies  CBC: Recent Labs  Lab 07/26/20 0407 07/28/20 0313 07/29/20 0325 08/01/20 0341  WBC 6.4 9.9 10.2 10.4  NEUTROABS  --   --  7.7 8.0*  HGB 10.3* 8.7* 8.9* 8.2*  HCT 30.9* 25.8* 26.7* 25.1*  MCV 108.8* 109.8* 109.4* 111.6*  PLT 176 212 259 628   Basic Metabolic Panel: Recent Labs  Lab 07/27/20 0334 07/28/20 0313 07/29/20 0325 07/30/20 0333 07/31/20 0414 08/01/20 0341  NA 136 133* 134* 134* 135 135  K 4.6 4.6 4.6 4.5 4.7 4.6  CL 104 99 98 98 102 104  CO2 26 26 26 27 25 23   GLUCOSE 144* 140* 138* 136* 137* 152*  BUN 13 10 12 14 19 17   CREATININE 0.38* 0.40* 0.46 0.47 0.48 0.44  CALCIUM 7.9* 8.4* 8.9 8.7* 8.7* 8.8*  MG 2.2 2.1 2.1 2.1  --  2.2  PHOS 3.1 4.0 4.6 4.1  --  3.7   GFR: Estimated Creatinine Clearance: 40.9 mL/min (by C-G formula based on SCr of 0.44 mg/dL). Liver Function Tests: Recent Labs  Lab 07/26/20 0407 07/27/20 0334 07/29/20 0325 08/01/20 0341  AST 23 20 43* 43*  ALT 11 10 17  32  ALKPHOS 30* 24* 53 75  BILITOT 0.1* 0.2* 0.1* 0.4  PROT 5.2* 4.8* 6.1* 6.2*   ALBUMIN 2.3* 2.0* 2.6* 2.7*   No results for input(s): LIPASE, AMYLASE in the last 168 hours. No results for input(s): AMMONIA in the last 168 hours. Coagulation Profile: No results for input(s): INR, PROTIME in the last 168 hours. Cardiac Enzymes: No results for input(s): CKTOTAL, CKMB, CKMBINDEX, TROPONINI in the last 168 hours. BNP (last 3 results) No results for input(s): PROBNP in the last 8760 hours. HbA1C: No results for input(s): HGBA1C in the last 72 hours. CBG: Recent Labs  Lab 07/31/20 0217 07/31/20 0853 07/31/20 1346 07/31/20 2046 08/01/20 0050  GLUCAP 128* 102* 83 152* 157*   Lipid Profile: No results for input(s): CHOL, HDL, LDLCALC, TRIG, CHOLHDL, LDLDIRECT in the last 72 hours. Thyroid Function Tests: No results for input(s): TSH, T4TOTAL, FREET4, T3FREE, THYROIDAB in the last 72 hours. Anemia Panel: No results for input(s): VITAMINB12,  FOLATE, FERRITIN, TIBC, IRON, RETICCTPCT in the last 72 hours. Sepsis Labs: No results for input(s): PROCALCITON, LATICACIDVEN in the last 168 hours.  Recent Results (from the past 240 hour(s))  Surgical pcr screen     Status: None   Collection Time: 07/22/20  9:46 AM   Specimen: Nasal Mucosa; Nasal Swab  Result Value Ref Range Status   MRSA, PCR NEGATIVE NEGATIVE Final   Staphylococcus aureus NEGATIVE NEGATIVE Final    Comment: (NOTE) The Xpert SA Assay (FDA approved for NASAL specimens in patients 29 years of age and older), is one component of a comprehensive surveillance program. It is not intended to diagnose infection nor to guide or monitor treatment. Performed at [facility], [place] [address] [place], [place] [address]          Radiology Studies: DG Abd Portable 1V  Result Date: 07/31/2020 CLINICAL DATA:  Postoperative ileus EXAM: PORTABLE ABDOMEN - 1 VIEW COMPARISON:  July 30, 2020 FINDINGS: Skin staples again noted over the lower abdomen and pelvis. There is currently no  appreciable bowel dilatation or air-fluid level to suggest bowel obstruction. Contrast noted in the proximal colon. No free air. There is aortic atherosclerosis. IMPRESSION: There is no longer appreciable bowel dilatation. No findings suggesting ileus or bowel obstruction. No free air evident on supine examination. Skin staples noted. Aortic Atherosclerosis (ICD10-I70.0). Electronically Signed   By: Lowella Grip III M.D.   On: 07/31/2020 11:26        Scheduled Meds: . Chlorhexidine Gluconate Cloth  6 each Topical Daily  . guaiFENesin  600 mg Oral BID  . insulin aspart  0-6 Units Subcutaneous Q6H  . lip balm  1 application Topical BID  . pantoprazole (PROTONIX) IV  40 mg Intravenous Q12H  . polyethylene glycol  17 g Oral Daily  . sodium chloride flush  10-40 mL Intracatheter Q12H   Continuous Infusions: . methocarbamol (ROBAXIN) IV 1,000 mg (08/01/20 0510)  . TPN CYCLIC-ADULT (ION)       LOS: 12 days    Time spent:25 mins. More than 50% of that time was spent in counseling and/or coordination of care.      Shelly Coss, MD [facility] P1/20/2022, 8:02 AM

## 2020-08-02 DIAGNOSIS — R739 Hyperglycemia, unspecified: Secondary | ICD-10-CM | POA: Diagnosis not present

## 2020-08-02 DIAGNOSIS — D539 Nutritional anemia, unspecified: Secondary | ICD-10-CM | POA: Diagnosis present

## 2020-08-02 DIAGNOSIS — E46 Unspecified protein-calorie malnutrition: Secondary | ICD-10-CM | POA: Diagnosis present

## 2020-08-02 DIAGNOSIS — K9189 Other postprocedural complications and disorders of digestive system: Secondary | ICD-10-CM

## 2020-08-02 DIAGNOSIS — E440 Moderate protein-calorie malnutrition: Secondary | ICD-10-CM

## 2020-08-02 DIAGNOSIS — R5381 Other malaise: Secondary | ICD-10-CM

## 2020-08-02 DIAGNOSIS — K567 Ileus, unspecified: Secondary | ICD-10-CM

## 2020-08-02 LAB — GLUCOSE, CAPILLARY
Glucose-Capillary: 155 mg/dL — ABNORMAL HIGH (ref 70–99)
Glucose-Capillary: 118 mg/dL — ABNORMAL HIGH (ref 70–99)
Glucose-Capillary: 103 mg/dL — ABNORMAL HIGH (ref 70–99)
Glucose-Capillary: 85 mg/dL (ref 70–99)
Glucose-Capillary: 121 mg/dL — ABNORMAL HIGH (ref 70–99)

## 2020-08-02 MED ORDER — METHOCARBAMOL 500 MG PO TABS
1000.0000 mg | ORAL_TABLET | Freq: Four times a day (QID) | ORAL | Status: DC
  Administered 2020-08-02 – 2020-08-03 (×4): 1000 mg via ORAL
  Filled 2020-08-02 (×4): qty 2

## 2020-08-02 MED ORDER — HYDROMORPHONE HCL 1 MG/ML IJ SOLN
0.5000 mg | Freq: Two times a day (BID) | INTRAMUSCULAR | Status: DC | PRN
  Filled 2020-08-02: qty 1

## 2020-08-02 MED ORDER — TRAMADOL HCL 50 MG PO TABS
50.0000 mg | ORAL_TABLET | Freq: Four times a day (QID) | ORAL | Status: DC | PRN
  Administered 2020-08-04 (×2): 50 mg via ORAL
  Filled 2020-08-02 (×2): qty 1

## 2020-08-02 MED ORDER — LIDOCAINE 5 % EX PTCH
1.0000 | MEDICATED_PATCH | CUTANEOUS | Status: DC
  Administered 2020-08-02 – 2020-08-04 (×3): 1 via TRANSDERMAL
  Filled 2020-08-02 (×4): qty 1

## 2020-08-02 MED ORDER — ENSURE ENLIVE PO LIQD
237.0000 mL | Freq: Three times a day (TID) | ORAL | Status: DC
  Administered 2020-08-04: 237 mL via ORAL

## 2020-08-02 MED ORDER — ENSURE ENLIVE PO LIQD: 237.0000 mL | Freq: Two times a day (BID) | ORAL | Status: DC

## 2020-08-02 MED ORDER — TRACE MINERALS CU-MN-SE-ZN 300-55-60-3000 MCG/ML IV SOLN
INTRAVENOUS | Status: DC
  Filled 2020-08-02: qty 263.2

## 2020-08-02 MED ORDER — TRAMADOL HCL 50 MG PO TABS: 50.0000 mg | ORAL_TABLET | Freq: Four times a day (QID) | ORAL | Status: DC | PRN

## 2020-08-02 MED ORDER — ACETAMINOPHEN 325 MG PO TABS
650.0000 mg | ORAL_TABLET | Freq: Four times a day (QID) | ORAL | Status: DC
  Administered 2020-08-02 – 2020-08-04 (×9): 650 mg via ORAL
  Filled 2020-08-02 (×9): qty 2

## 2020-08-02 MED ORDER — INSULIN ASPART 100 UNIT/ML ~~LOC~~ SOLN: 0.0000 [IU] | Freq: Three times a day (TID) | SUBCUTANEOUS | Status: DC

## 2020-08-02 NOTE — Care Management Important Message (Signed)
Important Message  Patient Details IM Letter placed in Patient's room. Name: Theresa Arnold MRN: [id number] Date of Birth: [birth year]   Medicare Important Message Given:  Yes     Kerin Salen 08/02/2020, 11:26 AM

## 2020-08-02 NOTE — Progress Notes (Addendum)
Assessment & Plan: POD#101 - S/p exploratory laparotomy with LOA 07/22/20 Dr. Ninfa Linden -prolonged post-op ileus, NG stopped working 1/17 and patient refused to have it replaced. Ok to leave it out.  - bowel function returned 1/20. Having some flatus and had another BM yesterday. Advance to FLD, then SOFT for dinner if FLD goes well. - would start weaning TNA today. - transition to PO pain control: scheduled APAP and robaxin, lidoderm patch, PRN tramadol, dilaudid for breakthrough only. -continue to mobilize, OOB, ambulate - encouraged         [name], [facility]       Office: [phone]   Chief Complaint: SBO  Subjective: NAEO. Having some abd soreness that is about the same. Tolerated broth, jello, ice pop yesterday. +BM documented from yesterday, patient thinks she had a BM yesterday but is unsure. Working with PT who at this time recommend SNF.  Objective: Vital signs in last 24 hours: Temp:  [98 F (36.7 C)-98.9 F (37.2 C)] 98.9 F (37.2 C) (01/21 0535) Pulse Rate:  [81-88] 82 (01/21 0535) Resp:  [16] 16 (01/21 0535) BP: (107-116)/(55-79) 114/55 (01/21 0535) SpO2:  [98 %-99 %] 98 % (01/21 0535) Last BM Date: 08/01/20  Intake/Output from previous day: 01/20 0701 - 01/21 0700 In: 904.4 [P.O.:360; I.V.:44.4; IV Piggyback:500] Out: -  Intake/Output this shift: Total I/O In: -  Out: 300 [Urine:300]  Physical Exam: HEENT - sclerae clear, mucous membranes moist Abdomen - soft, mild distention,  staples are c/d/i without drainage or surrounding cellulitis, +BS in all 4 quadrants Ext - no edema, non-tender Neuro - alert & oriented, no focal deficits  Lab Results:  Recent Labs    08/01/20 0341  WBC 10.4  HGB 8.2*  HCT 25.1*  PLT 299   BMET Recent Labs    07/31/20 0414 08/01/20 0341  NA 135 135  K 4.7 4.6  CL 102 104  CO2 25 23  GLUCOSE 137* 152*  BUN 19 17  CREATININE 0.48 0.44  CALCIUM 8.7* 8.8*    PT/INR No results for input(s): LABPROT, INR in the last 72 hours. Comprehensive Metabolic Panel:    Component Value Date/Time   NA 135 08/01/2020 0341   NA 135 07/31/2020 0414   K 4.6 08/01/2020 0341   K 4.7 07/31/2020 0414   CL 104 08/01/2020 0341   CL 102 07/31/2020 0414   CO2 23 08/01/2020 0341   CO2 25 07/31/2020 0414   BUN 17 08/01/2020 0341   BUN 19 07/31/2020 0414   CREATININE 0.44 08/01/2020 0341   CREATININE 0.48 07/31/2020 0414   GLUCOSE 152 (H) 08/01/2020 0341   GLUCOSE 137 (H) 07/31/2020 0414   CALCIUM 8.8 (L) 08/01/2020 0341   CALCIUM 8.7 (L) 07/31/2020 0414   AST 43 (H) 08/01/2020 0341   AST 43 (H) 07/29/2020 0325   ALT 32 08/01/2020 0341   ALT 17 07/29/2020 0325   ALKPHOS 75 08/01/2020 0341   ALKPHOS 53 07/29/2020 0325   BILITOT 0.4 08/01/2020 0341   BILITOT 0.1 (L) 07/29/2020 0325   PROT 6.2 (L) 08/01/2020 0341   PROT 6.1 (L) 07/29/2020 0325   ALBUMIN 2.7 (L) 08/01/2020 0341   ALBUMIN 2.6 (L) 07/29/2020 0325    Studies/Results: DG Abd Portable 1V  Result Date: 07/31/2020 CLINICAL DATA:  Postoperative ileus EXAM: PORTABLE ABDOMEN - 1 VIEW COMPARISON:  July 30, 2020 FINDINGS: Skin staples again noted over the lower abdomen and  pelvis. There is currently no appreciable bowel dilatation or air-fluid level to suggest bowel obstruction. Contrast noted in the proximal colon. No free air. There is aortic atherosclerosis. IMPRESSION: There is no longer appreciable bowel dilatation. No findings suggesting ileus or bowel obstruction. No free air evident on supine examination. Skin staples noted. Aortic Atherosclerosis (ICD10-I70.0). Electronically Signed   By: Lowella Grip III M.D.   On: 07/31/2020 11:26      [name] 8/50/2774  Patient ID: Theresa Arnold, female   DOB: 09-19-1931, 87 y.o.   MRN: [id number]

## 2020-08-02 NOTE — Progress Notes (Signed)
Physical Therapy Treatment Patient Details Name: Theresa Arnold MRN: [id number] DOB: 05-29-1932 Today's Date: 08/02/2020    History of Present Illness 87 y.o. female with medical history significant for diverticulitis which required colostomy in past (colostomy has been reversed) who presents for evaluation of abdominal pain with persistent nausea and vomiting. Dx of SBO. s/p exploratory laparoscopy with LOA on 07/22/20.    PT Comments    Pt showed marked improvement with activity tolerance today, she ambulated 130' with RW, no loss of balance, she denied during PT session. Instructed pt in seated BUE/LE exercises for strengthening. PT now recommending HHPT.   Follow Up Recommendations  Home health PT      Equipment Recommendations  None recommended by PT    Recommendations for Other Services       Precautions / Restrictions Precautions Precautions: Fall Precaution Comments: abdominal incision Restrictions Weight Bearing Restrictions: No    Mobility  Bed Mobility               General bed mobility comments: up in recliner  Transfers Overall transfer level: Needs assistance Equipment used: Rolling walker (2 wheeled) Transfers: Sit to/from Stand Sit to Stand: Supervision         General transfer comment: VCs for hand placement  Ambulation/Gait Ambulation/Gait assistance: Supervision Gait Distance (Feet): 130 Feet Assistive device: Rolling walker (2 wheeled) Gait Pattern/deviations: Step-through pattern;Decreased stride length;Trunk flexed     General Gait Details: steady, no loss of balance, distance limited by fatigue   Stairs             Wheelchair Mobility    Modified Rankin (Stroke Patients Only)       Balance Overall balance assessment: Needs assistance         Standing balance support: Bilateral upper extremity supported Standing balance-Leahy Scale: Poor                              Cognition Arousal/Alertness:  Awake/alert Behavior During Therapy: WFL for tasks assessed/performed Overall Cognitive Status: Within Functional Limits for tasks assessed                                        Exercises General Exercises - Upper Extremity Shoulder Flexion: AROM;Both;10 reps;Seated General Exercises - Lower Extremity Ankle Circles/Pumps: AROM;Both;10 reps;Seated Long Arc Quad: AROM;Both;Seated;15 reps Hip Flexion/Marching: AROM;Both;Standing;10 reps    General Comments        Pertinent Vitals/Pain Pain Assessment: No/denies pain    Home Living                      Prior Function            PT Goals (current goals can now be found in the care plan section) Acute Rehab PT Goals Patient Stated Goal: return to independence with mobility PT Goal Formulation: With patient Time For Goal Achievement: 08/06/20 Potential to Achieve Goals: Good Progress towards PT goals: Progressing toward goals    Frequency    Min 3X/week      PT Plan Current plan remains appropriate    Co-evaluation              AM-PAC PT "6 Clicks" Mobility   Outcome Measure  Help needed turning from your back to your side while in a flat bed without using bedrails?: A Little Help  needed moving from lying on your back to sitting on the side of a flat bed without using bedrails?: A Little Help needed moving to and from a bed to a chair (including a wheelchair)?: A Little Help needed standing up from a chair using your arms (e.g., wheelchair or bedside chair)?: A Little Help needed to walk in hospital room?: None Help needed climbing 3-5 steps with a railing? : A Little 6 Click Score: 19    End of Session Equipment Utilized During Treatment: Gait belt Activity Tolerance: Patient tolerated treatment well Patient left: with call bell/phone within reach;in chair Nurse Communication: Mobility status PT Visit Diagnosis: Muscle weakness (generalized) (M62.81);Difficulty in walking, not  elsewhere classified (R26.2) Pain - part of body:  (abdomen)     Time: 1201-1212 PT Time Calculation (min) (ACUTE ONLY): 11 min  Charges:  $Gait Training: 8-22 mins                     Blondell Reveal Kistler PT 08/02/2020  Acute Rehabilitation Services Pager [phone] Office [phone]

## 2020-08-02 NOTE — Progress Notes (Signed)
PHARMACY - TOTAL PARENTERAL NUTRITION CONSULT NOTE   Indication: Bowel obstruction  Patient Measurements: Height: 5' (152.4 cm) Weight: 60.1 kg (132 lb 7.9 oz) IBW/kg (Calculated) : 45.5 TPN AdjBW (KG): 49.4 Body mass index is 25.88 kg/m.  Assessment:  Ms. Pagett is an 87 yo female with hx of diverticulitis (s/p Hartmann procedure 03/22/19 with takedown on 09/04/19) presented to the hospital with abdominal pain and nausea/vomiting.  She had reported that she had not had any food for approximately 1 week prior to admission and was only drinking Gatorade and water. She also reported that she had no flatus or bowel movements also within 1 week.  Abd CT showed SBO and underwent exp lap with lysis of adhesions on 1/10.  Pharmacy consulted to start TPN on 1/11 for SBO.  Glucose / Insulin: sSSI (used 1 unit insulin in 24 hrs) - CBG (goal <150): 94-155 Electrolytes: all lytes wnl with labs on 1/20 Renal: stable (crcl~41) LFTs / TGs: LFTs wnl; TG 83 ( 1/11), 98 (1/17) Prealbumin / albumin: 6.7 (1/11), 23 (1/17) low, / albumin 2.7 (1/20) Intake / Output; MIVF: I/O +904 GI Imaging: - 1/8 CT of abdomen shows mechanical small bowel obstruction with transition point in the right lower quadrant. Underlying adhesive disease is suspected. - 1/10 persistent SBO   Surgeries / Procedures:  - 1/10 to OR  for exploratory laparotomy with LOA  Central access: will obtain PICC after procedure on 1/10 per consult - PICC not obtained on 1/10, line finally placed 1/11, TPN started at 0939 1/11   TPN start date:  1/11  Nutritional Goals:  (per RD recommendation on 1/17): Kcal:  1600-1800 Protein:  70-85g Fluid:  1.8L/day  Goal TPN rate is 70 mL/hr (provides 79  g of protein and 1612 kcals per day) - now cycling for 14 hr/day (total volume 1680 ml)  - 1/17: NG stopped - 1/20: adv diet - 1/21: ok with CCS to wean TPN down by half today (total volume 840 mL)  Current Nutrition:  NPO, ice chips    Plan:   Continue 14 hr cyclic TPN --> reduce total volume to 840 mL  Due to lipid shortage, lipids only MWF--> will not add lipid today (1/21) since weaning TPN  Electrolytes in TPN:  64mEq/L of Na, 70 mEq/L of K, 5mEq/L of Ca, 8 mEq/L of Mg, 15 mmol/L of Phos. Cl:Ac ratio 1:1  Add standard MVI and trace elements to TPN  Very Sensitive SSI q6h at 0100, 0900, 1300, 2000  Monitor TPN labs on Mon/Thurs   Lynelle Doctor PharmD 08/02/2020, 7:02 AM

## 2020-08-02 NOTE — Progress Notes (Signed)
Occupational Therapy Treatment Patient Details Name: Theresa Arnold MRN: [id number] DOB: 10-Jun-1932 Today's Date: 08/02/2020    History of present illness 87 y.o. female with medical history significant for diverticulitis which required colostomy in past (colostomy has been reversed) who presents for evaluation of abdominal pain with persistent nausea and vomiting. Dx of SBO. s/p exploratory laparoscopy with LOA on 07/22/20.   OT comments  Patient min A to power up to standing from recliner with min cues for hand placement, with encouragement patient stood sink side to perform grooming/hygiene at supervision level leaning onto sink for support. Patient then ambulate in room ~10 ft before returning to recliner in order to work on endurance needed for self care tasks. Educate patient that if going home with family need to be able to tolerate standing activity in order to maximize safety and independence with ADLs, pt verbalize understanding.   Follow Up Recommendations  Home health OT;Supervision/Assistance - 24 hour (pt family declining SNF)    Equipment Recommendations  3 in 1 bedside commode       Precautions / Restrictions Precautions Precautions: Fall Precaution Comments: abdominal incision Restrictions Weight Bearing Restrictions: No       Mobility Bed Mobility               General bed mobility comments: up in recliner  Transfers Overall transfer level: Needs assistance Equipment used: Rolling walker (2 wheeled) Transfers: Sit to/from Stand Sit to Stand: Min assist         General transfer comment: min A to power up to standing from recliner chair, cues for hand placement. patient states "someone has to help me or I'll fall"    Balance Overall balance assessment: Needs assistance Sitting-balance support: Feet supported Sitting balance-Leahy Scale: Good     Standing balance support: Single extremity supported;During functional activity Standing  balance-Leahy Scale: Poor Standing balance comment: leans onto sink with UE while brushing teeth                           ADL either performed or assessed with clinical judgement   ADL Overall ADL's : Needs assistance/impaired     Grooming: Oral care;Supervision/safety;Standing Grooming Details (indicate cue type and reason): encouraged patient to perform g/h in standing to build activity tolerance patient states "if I fall it will be your fault" provide encouragement and patient able to brush teeth leaning onto sink for support at supervision level             Lower Body Dressing: Maximal assistance;Sitting/lateral leans Lower Body Dressing Details (indicate cue type and reason): attempted to have patient use sock aid to don socks seated in chair, sock aid sticking to patient's foot therefore required max A to pull on handles to don socks. Toilet Transfer: Minimal assistance;Ambulation;RW Toilet Transfer Details (indicate cue type and reason): patient require min A to power up to standing from chair, min G for safety with eccentric control back to recliner at end of session         Functional mobility during ADLs: Min guard;Minimal assistance;Rolling walker General ADL Comments: requires encouragement to progress mobility/endurance for activity               Cognition Arousal/Alertness: Awake/alert Behavior During Therapy: WFL for tasks assessed/performed Overall Cognitive Status: Within Functional Limits for tasks assessed  Exercises General Exercises - Upper Extremity Shoulder Flexion: AROM;Both;10 reps;Seated General Exercises - Lower Extremity Ankle Circles/Pumps: AROM;Both;10 reps;Seated Long Arc Quad: AROM;Both;Seated;15 reps Hip Flexion/Marching: AROM;Both;Standing;10 reps           Pertinent Vitals/ Pain       Pain Assessment: No/denies pain         Frequency  Min 2X/week         Progress Toward Goals  OT Goals(current goals can now be found in the care plan section)  Progress towards OT goals: Progressing toward goals  Acute Rehab OT Goals Patient Stated Goal: return to independence with mobility OT Goal Formulation: With patient Time For Goal Achievement: 08/07/20 Potential to Achieve Goals: Good ADL Goals Pt Will Perform Grooming: with supervision;standing Pt Will Perform Lower Body Bathing: with supervision;sit to/from stand Pt Will Perform Lower Body Dressing: with supervision;sit to/from stand Additional ADL Goal #1: Pt will walk to bathroom with walker and complete all toileting with supervision.  Plan Discharge plan needs to be updated       AM-PAC OT "6 Clicks" Daily Activity     Outcome Measure   Help from another person eating meals?: None Help from another person taking care of personal grooming?: A Little Help from another person toileting, which includes using toliet, bedpan, or urinal?: A Lot Help from another person bathing (including washing, rinsing, drying)?: A Lot Help from another person to put on and taking off regular upper body clothing?: A Little Help from another person to put on and taking off regular lower body clothing?: A Lot 6 Click Score: 16    End of Session Equipment Utilized During Treatment: Rolling walker  OT Visit Diagnosis: Unsteadiness on feet (R26.81);Other abnormalities of gait and mobility (R26.89);Pain Pain - part of body:  (abdominal)   Activity Tolerance Patient tolerated treatment well   Patient Left in chair;with call bell/phone within reach   Nurse Communication Mobility status        Time: 5456-2563 OT Time Calculation (min): 23 min  Charges: OT General Charges $OT Visit: 1 Visit OT Treatments $Self Care/Home Management : 23-37 mins  Delbert Phenix OT OT pager: [place] 08/02/2020, 1:13 PM

## 2020-08-02 NOTE — TOC Progression Note (Signed)
Transition of Care [facility]) - Progression Note    Patient Details  Name: Theresa Arnold MRN: [id number] Date of Birth: [birth year]  Transition of Care [facility]) CM/SW [place], LCSW Phone Number: 08/02/2020, 1:00 PM  Clinical Narrative:    CSW provided the patient with a list of Medicare.gov home health agencies in the area. Patient declines because she will have adequate support from her family.   TOC staff will follow up with the patient at a later time and attempt to arrange home health again.    Expected Discharge Plan: [place]    Expected Discharge Plan and Services Expected Discharge Plan: [place] In-house Referral: Clinical Social Work Discharge Planning Services: CM Consult Post Acute Care Choice: [place] arrangements for the past 2 months: Single Family Home                                       Social Determinants of Health (SDOH) Interventions    Readmission Risk Interventions No flowsheet data found.

## 2020-08-02 NOTE — Progress Notes (Signed)
Progress Note    Theresa Arnold  HAL:[id number] DOB: 1931-08-09  DOA: 07/19/2020 PCP: Biagio Borg, MD    Brief Narrative:   Chief complaint: Abdominal pain associated with nausea and vomiting  Medical records reviewed and are as summarized below:  Theresa Arnold is an 87 y.o. female with a PMH of diverticulitis (s/p Hartmann procedure 03/22/19 with takedown on 09/04/19), hyperlipidemia, COPD who presented to hospital with complaints of abdominal pain nausea and vomiting. NG tube was placed for SBO confirmed on CT. General surgery was consulted. Patient had minimal improvement with bowel rest, NGT placement and was taken for exploratory laparotomy on 07/22/2020. She underwent lysis of adhesions and tolerated procedure well. Due to her prolonged n.p.o. intake while awaiting return of bowel function, she was started on  TPN after PICC line placement due to expected postop ileus, which has been prolonged. Bowels moved 08/01/20, and a CL diet has been initiated.   Assessment/Plan:   Principle Problem: Small bowel obstruction s/p exploratory laparotomy and LOA, complicated by prolonged postoperative ileus Patient underwent expiratory laparotomy on 07/22/2018 with lysis of adhesions.  History of Hartman's procedure and reversal in the past. Prolonged postoperative ileus. NG tube out on 07/30/19.  Started on TPN through PICC line. Bowels moved 08/01/20 and CL diet initiated. Currently on FL diet with plans to advance to soft later today. Will wean TNA.  Active Problems: Hyperglycemia Likely from TNA.  On SSI.  Macrocytic anemia Vitamin B35 and folic acid normal.  Currently hemoglobin is stable.  Continue monitoring.  Thrombocytopenia Resolved.  Hypokalemia/hypophosphatemia/hypomagnesemia Continue to monitor and supplement as necessary.  COPD Currently respiratory status stable.  On room air.  Continue bronchodilators as needed.  History of diverticulosis of colon Status post  Hartman's procedure for obstructed diverticulitis in the past.  Status post reversal.  Deconditioning/debility PT recommended SNF  on discharge.  TOC consulted.family and patient interested on home health.  Will be discharged to home when cleared by general surgery.  Protein calorie malnutrition Currently on TPN, which we will wean slowly as PO intake improves. Dietician following.  See assessment below. Agree with findings. Also hypoproteinemia/hypoalbuminemia. Nutrition Problem: Inadequate oral intake Etiology: nausea,vomiting (SBO) Signs/Symptoms: per patient/family report,NPO status Interventions: TPN  Body mass index is 25.88 kg/m.    Family Communication/Anticipated D/C date and plan/Code Status   DVT prophylaxis: Place and maintain sequential compression device Start: 07/27/20 1147 Current Level of Care:: Level of care: Med-Surg Code Status: Full Code.  Family Communication: Son updated by telephone. Disposition Plan: Status is: Inpatient  Remains inpatient appropriate because:Ongoing active pain requiring inpatient pain management   Dispo: The patient is from: Home              Anticipated d/c is to: Home              Anticipated d/c date is: 2 days              Patient currently is not medically stable to d/c.   Medical Consultants:    Surgery   Anti-Infectives:    None  Subjective:   Has some incisional pain but states her pain medication helps. No nausea or vomiting on FL diet. No SOB. Bowels moved yesterday.  Ambulating with walker/assistance.  Objective:    Vitals:   08/01/20 0518 08/01/20 1340 08/01/20 2128 08/02/20 0535  BP: 101/61 116/79 107/68 (!) 114/55  Pulse: 89 81 88 82  Resp: 16  16 16   Temp: 97.9  F (36.6 C) 98 F (36.7 C) 98.3 F (36.8 C) 98.9 F (37.2 C)  TempSrc: Oral Oral Oral Oral  SpO2: 98% 98% 99% 98%  Weight:      Height:        Intake/Output Summary (Last 24 hours) at 08/02/2020 0751 Last data filed at 08/02/2020  0718 Gross per 24 hour  Intake 904.41 ml  Output 300 ml  Net 604.41 ml   Filed Weights   07/19/20 1511 07/22/20 1043 07/29/20 1700  Weight: 61.2 kg 55.9 kg 60.1 kg    Exam: General: No acute distress. Cardiovascular: Heart sounds show a regular rate, and rhythm. No gallops or rubs. No murmurs. No JVD. Lungs: Clear to auscultation bilaterally with good air movement. No rales, rhonchi or wheezes. Abdomen: Soft, incisional tenderness. Abdomen mildly distended, + bowel sounds. No masses. No hepatosplenomegaly. Neurological: Alert and oriented 3. Moves all extremities 4 with equal strength. Cranial nerves II through XII grossly intact. Skin: Warm and dry. No rashes or lesions. Extremities: No clubbing or cyanosis. No edema. Pedal pulses 2+. Psychiatric: Mood and affect are normal. Insight and judgment are fair.   Data Reviewed:   I have personally reviewed following labs and imaging studies:  Labs: Labs show the following:   Basic Metabolic Panel: Recent Labs  Lab 07/27/20 0334 07/28/20 0313 07/29/20 0325 07/30/20 0333 07/31/20 0414 08/01/20 0341  NA 136 133* 134* 134* 135 135  K 4.6 4.6 4.6 4.5 4.7 4.6  CL 104 99 98 98 102 104  CO2 26 26 26 27 25 23   GLUCOSE 144* 140* 138* 136* 137* 152*  BUN 13 10 12 14 19 17   CREATININE 0.38* 0.40* 0.46 0.47 0.48 0.44  CALCIUM 7.9* 8.4* 8.9 8.7* 8.7* 8.8*  MG 2.2 2.1 2.1 2.1  --  2.2  PHOS 3.1 4.0 4.6 4.1  --  3.7   GFR Estimated Creatinine Clearance: 40.9 mL/min (by C-G formula based on SCr of 0.44 mg/dL). Liver Function Tests: Recent Labs  Lab 07/27/20 0334 07/29/20 0325 08/01/20 0341  AST 20 43* 43*  ALT 10 17 32  ALKPHOS 24* 53 75  BILITOT 0.2* 0.1* 0.4  PROT 4.8* 6.1* 6.2*  ALBUMIN 2.0* 2.6* 2.7*   CBC: Recent Labs  Lab 07/28/20 0313 07/29/20 0325 08/01/20 0341  WBC 9.9 10.2 10.4  NEUTROABS  --  7.7 8.0*  HGB 8.7* 8.9* 8.2*  HCT 25.8* 26.7* 25.1*  MCV 109.8* 109.4* 111.6*  PLT 212 259 299    CBG: Recent Labs  Lab 08/01/20 1012 08/01/20 1348 08/01/20 2127 08/02/20 0111 08/02/20 0740  GLUCAP 109* 94 137* 155* 118*    Microbiology No results found for this or any previous visit (from the past 240 hour(s)).  Procedures and diagnostic studies:  DG Abd Portable 1V  Result Date: 07/31/2020 CLINICAL DATA:  Postoperative ileus EXAM: PORTABLE ABDOMEN - 1 VIEW COMPARISON:  July 30, 2020 FINDINGS: Skin staples again noted over the lower abdomen and pelvis. There is currently no appreciable bowel dilatation or air-fluid level to suggest bowel obstruction. Contrast noted in the proximal colon. No free air. There is aortic atherosclerosis. IMPRESSION: There is no longer appreciable bowel dilatation. No findings suggesting ileus or bowel obstruction. No free air evident on supine examination. Skin staples noted. Aortic Atherosclerosis (ICD10-I70.0). Electronically Signed   By: Lowella Grip III M.D.   On: 07/31/2020 11:26    Medications:    acetaminophen  650 mg Oral Q6H   Chlorhexidine Gluconate Cloth  6 each  Topical Daily   docusate sodium  100 mg Oral BID   guaiFENesin  600 mg Oral BID   insulin aspart  0-6 Units Subcutaneous TID AC & HS   lidocaine  1 patch Transdermal Q24H   lip balm  1 application Topical BID   methocarbamol  1,000 mg Oral QID   pantoprazole (PROTONIX) IV  40 mg Intravenous Q12H   polyethylene glycol  17 g Oral BID   sodium chloride flush  10-40 mL Intracatheter Q12H   Continuous Infusions:  TPN CYCLIC-ADULT (ION) 65 mL/hr at 08/02/20 0612     LOS: 13 days   Jacquelynn Cree, MD  [facility]   [facility] How to contact the [facility] Attending or Consulting provider [place] or covering provider during after hours [place], for this patient?  1. Check the care team in [facility] and look for a) attending/consulting TR[facility] provider listed and b) the [facility] team listed 2. Log into [email or web address] and use [place]'s universal password to  access. If you do not have the password, please contact the hospital operator. 3. Locate the [facility] provider you are looking for under [facility] and page to a number that you can be directly reached. 4. If you still have difficulty reaching the provider, please page the [facility] (Director on Call) for the Hospitalists listed on amion for assistance.  08/02/2020, 7:51 AM

## 2020-08-03 LAB — GLUCOSE, CAPILLARY
Glucose-Capillary: 126 mg/dL — ABNORMAL HIGH (ref 70–99)
Glucose-Capillary: 117 mg/dL — ABNORMAL HIGH (ref 70–99)
Glucose-Capillary: 91 mg/dL (ref 70–99)

## 2020-08-03 MED ORDER — METHOCARBAMOL 500 MG PO TABS
500.0000 mg | ORAL_TABLET | Freq: Three times a day (TID) | ORAL | Status: DC
  Administered 2020-08-03 – 2020-08-04 (×3): 500 mg via ORAL
  Filled 2020-08-03 (×4): qty 1

## 2020-08-03 NOTE — Progress Notes (Signed)
PHARMACY - TOTAL PARENTERAL NUTRITION CONSULT NOTE   Indication: Bowel obstruction  Patient Measurements: Height: 5' (152.4 cm) Weight: 60.1 kg (132 lb 7.9 oz) IBW/kg (Calculated) : 45.5 TPN AdjBW (KG): 49.4 Body mass index is 25.88 kg/m.  Assessment:  Theresa Arnold is an 87 yo female with hx of diverticulitis (s/p Hartmann procedure 03/22/19 with takedown on 09/04/19) presented to the hospital with abdominal pain and nausea/vomiting.  She had reported that she had not had any food for approximately 1 week prior to admission and was only drinking Gatorade and water. She also reported that she had no flatus or bowel movements also within 1 week.  Abd CT showed SBO and underwent exp lap with lysis of adhesions on 1/10.  Pharmacy consulted to start TPN on 1/11 for SBO.  Glucose / Insulin: sSSI (used 1 unit insulin in 24 hrs) - CBG (goal <150): 94-155 Electrolytes: all lytes wnl with labs on 1/20 Renal: stable (crcl~41) LFTs / TGs: LFTs wnl; TG 83 ( 1/11), 98 (1/17) Prealbumin / albumin: 6.7 (1/11), 23 (1/17) low, / albumin 2.7 (1/20) Intake / Output; MIVF: I/O +904 GI Imaging: - 1/8 CT of abdomen shows mechanical small bowel obstruction with transition point in the right lower quadrant. Underlying adhesive disease is suspected. - 1/10 persistent SBO   Surgeries / Procedures:  - 1/10 to OR  for exploratory laparotomy with LOA  Central access: will obtain PICC after procedure on 1/10 per consult - PICC not obtained on 1/10, line finally placed 1/11, TPN started at 0939 1/11   TPN start date:  1/11  Nutritional Goals:  (per RD recommendation on 1/17): Kcal:  1600-1800 Protein:  70-85g Fluid:  1.8L/day  Goal TPN rate is 70 mL/hr (provides 79  g of protein and 1612 kcals per day) - now cycling for 14 hr/day (total volume 1680 ml)  - 1/17: NG stopped - 1/20: adv diet - 1/21: ok with CCS to wean TPN down by half today (total volume 840 mL)  Current Nutrition:  NPO, ice chips    Plan:  Discontinue TPN, cyclic formula ended at 8am this morning, no further labs.    Due to lipid shortage, lipids only MWF--> will not add lipid today (1/21) since weaning TPN  Electrolytes in TPN:  23mEq/L of Na, 70 mEq/L of K, 54mEq/L of Ca, 8 mEq/L of Mg, 15 mmol/L of Phos. Cl:Ac ratio 1:1  Add standard MVI and trace elements to TPN  Very Sensitive SSI q6h at 0100, 0900, 1300, 2000  Monitor TPN labs on Mon/Thurs   Minda Ditto PharmD 08/03/2020, 8:48 AM

## 2020-08-03 NOTE — Progress Notes (Signed)
[facility] Progress Note  Patient: Theresa Arnold    ACZ:[id number]  DOA: 07/19/2020     Date of Service: the patient was seen and examined on 08/03/2020  Brief hospital course: Past medical history of COPD, HLD, diverticulitis, Hartman's procedure.  Presents with complaints of nausea and vomiting.  Found to have SBO.  Failed conservative measures requiring expiratory laparotomy.  Suffer from prolonged ileus. Currently plan is monitor for improvement in diet advancement..  Assessment and Plan: 1.  SBO SP expiratory laparotomy with LOA, 1/10 Prolonged ileus NG tube out 1/17. Bowel function returned 1/20 per prior notes. Currently on soft diet. Stopping TNA as well. Pain control per surgery.  Appreciate assistance. Mobilize, out of bed to chair. If tolerates diet today, potentially can go home tomorrow.  2.  COPD Currently stable. Continue inhalers.  3.  Hyperglycemia. Hypokalemia. Hypophosphatemia. Hypomagnesemia Currently on TPN. On SSI.  4.  Macrocytic anemia. Acute thrombocytopenia H&H relatively stable. Platelets stable. Monitor.  5.  Deconditioning PT OT recommends SNF. Patient wants to go home with home health.  6. inadequate oral intake in the setting of medical condition. No protein calorie malnutrition Currently on TPN.  Dietitian following. Body mass index is 25.88 kg/m.  Nutrition Problem: Inadequate oral intake Etiology: nausea,vomiting (SBO) Interventions: Interventions: TPN      Diet: soft diet, TPN DVT Prophylaxis:  Place and maintain sequential compression device Start: 07/27/20 1147    Advance goals of care discussion: Full code  Family Communication: no family was present at bedside, at the time of interview.   Disposition:  Status is: Inpatient  Remains inpatient appropriate because:IV treatments appropriate due to intensity of illness or inability to take PO  Dispo: The patient is from: Home              Anticipated d/c is  to: Home              Anticipated d/c date is: 1 day              Patient currently is not medically stable to d/c.   Difficult to place patient No  Subjective: No nausea no vomiting.  No fever no chills.  Had a large bowel movement without any blood.  Tolerating soft diet.  No chest pain or shortness of breath.  Physical Exam:  General: Appear in mild distress, no Rash; Oral Mucosa Clear, moist. no Abnormal Neck Mass Or lumps, Conjunctiva normal  Cardiovascular: S1 and S2 Present, no Murmur, Respiratory: good respiratory effort, Bilateral Air entry present and CTA, no Crackles, no wheezes Abdomen: Bowel Sound present, Soft and no tenderness Extremities: no Pedal edema Neurology: alert and oriented to time, place, and person affect appropriate. no new focal deficit Gait not checked due to patient safety concerns  Vitals:   08/02/20 0535 08/02/20 1359 08/02/20 2055 08/03/20 0530  BP: (!) 114/55 (!) 120/52 (!) 116/54 (!) 120/56  Pulse: 82 77 73 80  Resp: 16 16 16 16   Temp: 98.9 F (37.2 C) 97.8 F (36.6 C)  98.4 F (36.9 C)  TempSrc: Oral Oral  Oral  SpO2: 98% 99% 100% 100%  Weight:      Height:        Intake/Output Summary (Last 24 hours) at 08/03/2020 0752 Last data filed at 08/03/2020 1093 Gross per 24 hour  Intake 372.13 ml  Output 4 ml  Net 368.13 ml   Filed Weights   07/19/20 1511 07/22/20 1043 07/29/20 1700  Weight: 61.2 kg 55.9 kg  60.1 kg    Data Reviewed: I have personally reviewed and interpreted daily labs, tele strips, imaging. I reviewed all nursing notes, pharmacy notes, vitals, pertinent old records I have discussed plan of care as described above with RN and patient/family.  CBC: Recent Labs  Lab 07/28/20 0313 07/29/20 0325 08/01/20 0341  WBC 9.9 10.2 10.4  NEUTROABS  --  7.7 8.0*  HGB 8.7* 8.9* 8.2*  HCT 25.8* 26.7* 25.1*  MCV 109.8* 109.4* 111.6*  PLT 212 259 935   Basic Metabolic Panel: Recent Labs  Lab 07/28/20 0313 07/29/20 0325  07/30/20 0333 07/31/20 0414 08/01/20 0341  NA 133* 134* 134* 135 135  K 4.6 4.6 4.5 4.7 4.6  CL 99 98 98 102 104  CO2 26 26 27 25 23   GLUCOSE 140* 138* 136* 137* 152*  BUN 10 12 14 19 17   CREATININE 0.40* 0.46 0.47 0.48 0.44  CALCIUM 8.4* 8.9 8.7* 8.7* 8.8*  MG 2.1 2.1 2.1  --  2.2  PHOS 4.0 4.6 4.1  --  3.7    Studies: No results found.  Scheduled Meds: . acetaminophen  650 mg Oral Q6H  . Chlorhexidine Gluconate Cloth  6 each Topical Daily  . docusate sodium  100 mg Oral BID  . feeding supplement  237 mL Oral TID BM  . guaiFENesin  600 mg Oral BID  . insulin aspart  0-6 Units Subcutaneous TID AC & HS  . lidocaine  1 patch Transdermal Q24H  . lip balm  1 application Topical BID  . methocarbamol  1,000 mg Oral QID  . pantoprazole (PROTONIX) IV  40 mg Intravenous Q12H  . polyethylene glycol  17 g Oral BID  . sodium chloride flush  10-40 mL Intracatheter Q12H   Continuous Infusions: . TPN CYCLIC-ADULT (ION) 32 mL/hr at 08/03/20 0603   PRN Meds: alum & mag hydroxide-simeth, HYDROmorphone (DILAUDID) injection, magic mouthwash, menthol-cetylpyridinium, ondansetron (ZOFRAN) IV **OR** [DISCONTINUED] ondansetron (ZOFRAN) IV, ondansetron **OR** [DISCONTINUED] ondansetron (ZOFRAN) IV, phenol, sodium chloride flush, traMADol  Time spent: 35 minutes  Author: Berle Mull, MD [facility] 08/03/2020 7:52 AM  To reach On-call, see care teams to locate the attending and reach out via [email or web address]. Between 7PM-7AM, please contact night-coverage If you still have difficulty reaching the attending provider, please page the [facility] (Director on Call) for [facility] on amion for assistance.

## 2020-08-03 NOTE — Progress Notes (Signed)
12 Days Post-Op   Subjective/Chief Complaint: She states having flatus and bm, no n/v tol soft diet last night and this am but still some distention   Objective: Vital signs in last 24 hours: Temp:  [97.8 F (36.6 C)-98.4 F (36.9 C)] 98.4 F (36.9 C) (01/22 0530) Pulse Rate:  [73-80] 80 (01/22 0530) Resp:  [16] 16 (01/22 0530) BP: (116-120)/(52-56) 120/56 (01/22 0530) SpO2:  [99 %-100 %] 100 % (01/22 0530) Last BM Date: 08/01/20  Intake/Output from previous day: 01/21 0701 - 01/22 0700 In: 372.1 [P.O.:360; I.V.:12.1] Out: 304 [Urine:304] Intake/Output this shift: Total I/O In: -  Out: 1 [Urine:1]  Ab mildly distended incision clean bs present nontender   Lab Results:  Recent Labs    08/01/20 0341  WBC 10.4  HGB 8.2*  HCT 25.1*  PLT 299   BMET Recent Labs    08/01/20 0341  NA 135  K 4.6  CL 104  CO2 23  GLUCOSE 152*  BUN 17  CREATININE 0.44  CALCIUM 8.8*   PT/INR No results for input(s): LABPROT, INR in the last 72 hours. ABG No results for input(s): PHART, HCO3 in the last 72 hours.  Invalid input(s): PCO2, PO2  Studies/Results: No results found.  Anti-infectives: Anti-infectives (From admission, onward)   Start     Dose/Rate Route Frequency Ordered Stop   07/22/20 0915  ceFAZolin (ANCEF) IVPB 2g/100 mL premix        2 g 200 mL/hr over 30 Minutes Intravenous On call to O.R. 07/22/20 0908 07/22/20 1204      Assessment/Plan: POD 12 elap with loa -prolonged ileus appears better with bowel function and tolerating diet -tpn off -soft diet -oob, pulm toilet -po pain control -if does well could be discharged as early as tomorrow  Rolm Bookbinder 08/03/2020

## 2020-08-04 LAB — BASIC METABOLIC PANEL
Anion gap: 9 (ref 5–15)
BUN: 20 mg/dL (ref 8–23)
CO2: 22 mmol/L (ref 22–32)
Calcium: 8.6 mg/dL — ABNORMAL LOW (ref 8.9–10.3)
Chloride: 106 mmol/L (ref 98–111)
Creatinine, Ser: 0.62 mg/dL (ref 0.44–1.00)
GFR, Estimated: >60 mL/min (ref >60)
Glucose, Bld: 91 mg/dL (ref 70–99)
Potassium: 3.7 mmol/L (ref 3.5–5.1)
Sodium: 137 mmol/L (ref 135–145)

## 2020-08-04 LAB — MAGNESIUM: Magnesium: 2 mg/dL (ref 1.7–2.4)

## 2020-08-04 MED ORDER — TRAMADOL HCL 50 MG PO TABS: 50.0000 mg | ORAL_TABLET | Freq: Four times a day (QID) | ORAL | 0 refills | Status: DC | PRN

## 2020-08-04 MED ORDER — ENSURE ENLIVE PO LIQD: 237.0000 mL | Freq: Three times a day (TID) | ORAL | 0 refills | Status: DC

## 2020-08-04 MED ORDER — GUAIFENESIN ER 600 MG PO TB12: 600.0000 mg | ORAL_TABLET | Freq: Two times a day (BID) | ORAL | 0 refills | Status: DC

## 2020-08-04 MED ORDER — METHOCARBAMOL 500 MG PO TABS: 500.0000 mg | ORAL_TABLET | Freq: Three times a day (TID) | ORAL | 0 refills | Status: DC | PRN

## 2020-08-04 MED ORDER — PANTOPRAZOLE SODIUM 40 MG PO TBEC: 40.0000 mg | DELAYED_RELEASE_TABLET | Freq: Two times a day (BID) | ORAL | 0 refills | Status: DC

## 2020-08-04 MED ORDER — POLYETHYLENE GLYCOL 3350 17 G PO PACK: 17.0000 g | PACK | Freq: Two times a day (BID) | ORAL | 0 refills | Status: DC

## 2020-08-04 MED ORDER — DOCUSATE SODIUM 100 MG PO CAPS: 100.0000 mg | ORAL_CAPSULE | Freq: Two times a day (BID) | ORAL | 0 refills | Status: DC

## 2020-08-04 MED ORDER — PANTOPRAZOLE SODIUM 40 MG PO TBEC: 40.0000 mg | DELAYED_RELEASE_TABLET | Freq: Two times a day (BID) | ORAL | Status: DC

## 2020-08-04 NOTE — Progress Notes (Signed)
13 Days Post-Op   Subjective/Chief Complaint: Feels great, having bms/flatus tol diet   Objective: Vital signs in last 24 hours: Temp:  [98 F (36.7 C)-98.9 F (37.2 C)] 98 F (36.7 C) (01/23 0641) Pulse Rate:  [77-81] 77 (01/23 0641) Resp:  [16-18] 18 (01/23 0641) BP: (102-112)/(60-73) 102/60 (01/23 0641) SpO2:  [95 %-99 %] 97 % (01/23 0641) Last BM Date: 08/03/20  Intake/Output from previous day: 01/22 0701 - 01/23 0700 In: 840 [P.O.:840] Out: 301 [Urine:301] Intake/Output this shift: Total I/O In: 120 [P.O.:120] Out: -   GI: nondistended nontender bs present incision clean  Lab Results:  No results for input(s): WBC, HGB, HCT, PLT in the last 72 hours. BMET Recent Labs    08/04/20 0305  NA 137  K 3.7  CL 106  CO2 22  GLUCOSE 91  BUN 20  CREATININE 0.62  CALCIUM 8.6*   PT/INR No results for input(s): LABPROT, INR in the last 72 hours. ABG No results for input(s): PHART, HCO3 in the last 72 hours.  Invalid input(s): PCO2, PO2  Studies/Results: No results found.  Anti-infectives: Anti-infectives (From admission, onward)   Start     Dose/Rate Route Frequency Ordered Stop   07/22/20 0915  ceFAZolin (ANCEF) IVPB 2g/100 mL premix        2 g 200 mL/hr over 30 Minutes Intravenous On call to O.R. 07/22/20 0908 07/22/20 1204      Assessment/Plan: POD 13 elap with loa -prolonged ileus appears better with bowel function and tolerating diet -tpn off -soft diet -oob, pulm toilet -po pain control -home today from my standpoint  Rolm Bookbinder 08/04/2020

## 2020-08-04 NOTE — Discharge Instructions (Signed)
[facility]      [facility] [phone]  OPEN ABDOMINAL SURGERY: POST OP INSTRUCTIONS  Always review your discharge instruction sheet given to you by the facility where your surgery was performed.  IF YOU HAVE DISABILITY OR FAMILY LEAVE FORMS, YOU MUST BRING THEM TO THE OFFICE FOR PROCESSING.  PLEASE DO NOT GIVE THEM TO YOUR DOCTOR.  1. A prescription for pain medication may be given to you upon discharge.  Take your pain medication as prescribed, if needed.  If narcotic pain medicine is not needed, then you may take acetaminophen (Tylenol) or ibuprofen (Advil) as needed. 2. Take your usually prescribed medications unless otherwise directed. 3. If you need a refill on your pain medication, please contact your pharmacy. They will contact our office to request authorization.  Prescriptions will not be filled after 5pm or on week-ends. 4. You should follow a light diet the first few days after arrival home, such as soup and crackers, pudding, etc.unless your doctor has advised otherwise. A high-fiber, low fat diet can be resumed as tolerated.   Be sure to include lots of fluids daily. Most patients will experience some swelling and bruising on the chest and neck area.  Ice packs will help.  Swelling and bruising can take several days to resolve 5. Most patients will experience some swelling and bruising in the area of the incision. Ice pack will help. Swelling and bruising can take several days to resolve..  6. It is common to experience some constipation if taking pain medication after surgery.  Increasing fluid intake and taking a stool softener will usually help or prevent this problem from occurring.  A mild laxative (Milk of Magnesia or Miralax) should be taken according to package directions if there are no bowel movements after 48 hours. 7.  You may have steri-strips (small skin tapes) in place directly over the incision.  These strips should be left on the skin for 7-10 days.  If your  surgeon used skin glue on the incision, you may shower in 24 hours.  The glue will flake off over the next 2-3 weeks.  Any sutures or staples will be removed at the office during your follow-up visit. You may find that a light gauze bandage over your incision may keep your staples from being rubbed or pulled. You may shower and replace the bandage daily. 8. ACTIVITIES:  You may resume regular (light) daily activities beginning the next day--such as daily self-care, walking, climbing stairs--gradually increasing activities as tolerated.  You may have sexual intercourse when it is comfortable.  Refrain from any heavy lifting or straining until approved by your doctor. a. You may drive when you no longer are taking prescription pain medication, you can comfortably wear a seatbelt, and you can safely maneuver your car and apply brakes b. Return to Work: ___________________________________ 9. You should see your doctor in the office for a follow-up appointment approximately two weeks after your surgery.  Make sure that you call for this appointment within a day or two after you arrive home to insure a convenient appointment time. OTHER INSTRUCTIONS:  _____________________________________________________________ _____________________________________________________________  WHEN TO CALL YOUR DOCTOR: 1. Fever over 101.0 2. Inability to urinate 3. Nausea and/or vomiting 4. Extreme swelling or bruising 5. Continued bleeding from incision. 6. Increased pain, redness, or drainage from the incision. 7. Difficulty swallowing or breathing 8. Muscle cramping or spasms. 9. Numbness or tingling in hands or feet or around lips.  The clinic staff is available to   answer your questions during regular business hours.  Please don't hesitate to call and ask to speak to one of the nurses if you have concerns.  For further questions, please visit [email or web address]   Soft-Food Eating Plan A soft-food  eating plan includes foods that are safe and easy to chew and swallow. Your health care provider or dietitian can help you find foods and flavors that fit into this plan. Follow this plan until your health care provider or dietitian says it is safe to start eating other foods and food textures. What are tips for following this plan? General guidelines  Take small bites of food, or cut food into pieces about  inch or smaller. Bite-sized pieces of food are easier to chew and swallow.  Eat moist foods. Avoid overly dry foods.  Avoid foods that: ? Are difficult to swallow, such as dry, chunky, crispy, or sticky foods. ? Are difficult to chew, such as hard, tough, or stringy foods. ? Contain nuts, seeds, or fruits.  Follow instructions from your dietitian about the types of liquids that are safe for you to swallow. You may be allowed to have: ? Thick liquids only. This includes only liquids that are thicker than honey. ? Thin and thick liquids. This includes all beverages and foods that become liquid at room temperature.  To make thick liquids: ? Purchase a commercial liquid thickening powder. These are available at grocery stores and pharmacies. ? Mix the thickener into liquids according to instructions on the label. ? Purchase ready-made thickened liquids. ? Thicken soup by pureeing, straining to remove chunks, and adding flour, potato flakes, or corn starch. ? Add commercial thickener to foods that become liquid at room temperature, such as milk shakes, yogurt, ice cream, gelatin, and sherbet.  Ask your health care provider whether you need to take a fiber supplement.   Cooking  Cook meats so they stay tender and moist. Use methods like braising, stewing, or baking in liquid.  Cook vegetables and fruit until they are soft enough to be mashed with a fork.  Peel soft, fresh fruits such as peaches, nectarines, and melons.  When making soup, make sure chunks of meat and vegetables are  smaller than  inch.  Reheat leftover foods slowly so that a tough crust does not form. What foods are allowed? The items listed below may not be a complete list. Talk with your dietitian about what dietary choices are best for you. Grains Breads, muffins, pancakes, or waffles moistened with syrup, jelly, or butter. Dry cereals well-moistened with milk. Moist, cooked cereals. Well-cooked pasta and rice. Vegetables All soft-cooked vegetables. Shredded lettuce. Fruits All canned and cooked fruits. Soft, peeled fresh fruits. Strawberries. Dairy Milk. Cream. Yogurt. Cottage cheese. Soft cheese without the rind. Meats and other protein foods Tender, moist ground meat, poultry, or fish. Meat cooked in gravy or sauces. Eggs. Sweets and desserts Ice cream. Milk shakes. Sherbet. Pudding. Fats and oils Butter. Margarine. Olive, canola, sunflower, and grapeseed oil. Smooth salad dressing. Smooth cream cheese. Mayonnaise. Gravy. What foods are not allowed? The items listed bemay not be a complete list. Talk with your dietitian about what dietary choices are best for you. Grains Coarse or dry cereals, such as bran, granola, and shredded wheat. Tough or chewy crusty breads, such as [place] bread or baguettes. Breads with nuts, seeds, or fruit. Vegetables All raw vegetables. Cooked corn. Cooked vegetables that are tough or stringy. Tough, crisp, fried potatoes and potato skins. Fruits Fresh fruits with skins or  seeds, or both, such as apples, pears, and grapes. Stringy, high-pulp fruits, such as papaya, pineapple, coconut, and mango. Fruit leather and all dried fruit. Dairy Yogurt with nuts or coconut. Meats and other protein foods Hard, dry sausages. Dry meat, poultry, or fish. Meats with gristle. Fish with bones. Fried meat or fish. Lunch meat and hotdogs. Nuts and seeds. Chunky peanut butter or other nut butters. Sweets and desserts Cakes or cookies that are very dry or chewy. Desserts with dried  fruit, nuts, or coconut. Fried pastries. Very rich pastries. Fats and oils Cream cheese with fruit or nuts. Salad dressings with seeds or chunks. Summary  A soft-food eating plan includes foods that are safe and easy to swallow. Generally, the foods should be soft enough to be mashed with a fork.  Avoid foods that are dry, hard to chew, crunchy, sticky, stringy, or crispy.  Ask your health care provider whether you need to thicken your liquids and if you need to take a fiber supplement. This information is not intended to replace advice given to you by your health care provider. Make sure you discuss any questions you have with your health care provider. Document Revised: 10/20/2018 Document Reviewed: 09/01/2016 Elsevier Patient Education  2021 [employer].

## 2020-08-04 NOTE — TOC Progression Note (Signed)
Transition of Care [facility]) - Progression Note    Patient Details  Name: Theresa Arnold MRN: [id number] Date of Birth: [birth year]  [facility] CM/SW Contact  Joaquin Courts, RN Phone Number: 08/04/2020, 3:50 PM  Clinical Narrative:    Patient declines [place] services.   Expected Discharge Plan: [place]    Expected Discharge Plan and Services Expected Discharge Plan: [place] In-house Referral: Clinical Social Work Discharge Planning Services: CM Consult Post Acute Care Choice: [place] arrangements for the past 2 months: Single Family Home Expected Discharge Date: 08/04/20                                     Social Determinants of Health (SDOH) Interventions    Readmission Risk Interventions No flowsheet data found.

## 2020-08-04 NOTE — Progress Notes (Signed)
Pt discharged via wheelchair accompanied by Network engineer. Son picked up patient at main entrance.

## 2020-08-05 ENCOUNTER — Telehealth: Payer: Self-pay

## 2020-08-05 NOTE — Telephone Encounter (Signed)
Transition Care Management Follow-up Telephone Call  Date of discharge and from where: 08/04/2020 from [facility]  How have you been since you were released from the hospital? Still hurting  Any questions or concerns? No  Items Reviewed:  Did the pt receive and understand the discharge instructions provided? Yes   Medications obtained and verified? Yes   Other? No   Any new allergies since your discharge? No   Dietary orders reviewed? Yes (soft-food eating plan)  Do you have support at home? Yes  (son)  [place] and Equipment/Supplies: Were home health services ordered? no If so, what is the name of the agency? n/a  Has the agency set up a time to come to the patient's home? not applicable Were any new equipment or medical supplies ordered?  No What is the name of the medical supply agency? n/a Were you able to get the supplies/equipment? not applicable Do you have any questions related to the use of the equipment or supplies? No  Functional Questionnaire: (I = Independent and D = Dependent) ADLs: I  Bathing/Dressing- I  Meal Prep- D  Eating- I  Maintaining continence- I  Transferring/Ambulation- D (using a walker)  Managing Meds- I  Follow up appointments reviewed:   PCP Hospital f/u appt confirmed? Yes  Scheduled to see Cathlean Cower, MD. on 08/13/2020 @ 3:20 pm.  [place] [facility] f/u appt confirmed? No  Scheduled to see Coralie Keens, MD with [facility]; appointment not made as of yet.   Are transportation arrangements needed? No  (son will provide transportation)  If their condition worsens, is the pt aware to call PCP or go to the Emergency Dept.? Yes  Was the patient provided with contact information for the PCP's office or ED? Yes  Was to pt encouraged to call back with questions or concerns? Yes

## 2020-08-06 NOTE — Discharge Summary (Signed)
[facility] Discharge Summary   Patient: Theresa Arnold [id number]  PCP: Biagio Borg, MD  Date of admission: 07/19/2020   Date of discharge: 08/04/2020      Discharge Diagnoses:  Principal Problem:   SBO (small bowel obstruction) ([place]) Active Problems:   Debility   Diverticulosis of colon   COPD (chronic obstructive pulmonary disease) (HCC)   Thrombocytopenia (HCC)   Hypokalemia   Hyperglycemia   Ileus, postoperative (HCC)   Macrocytic anemia   Hypophosphatemia   Hypomagnesemia   Physical deconditioning   Protein calorie malnutrition ([place])   Admitted From: home Disposition:  Home   Recommendations for Outpatient Follow-up:  1. PCP: please follow up with PCP in 1 week 2. Follow up LABS/TEST:  none   Follow-up Information    Coralie Keens, MD Follow up in 2 week(s).   Specialty: General Surgery Contact information: [address] [place] [address] [phone]        Biagio Borg, MD. Schedule an appointment as soon as possible for a visit in 1 week(s).   Specialties: Internal Medicine, Radiology Contact information: [place] [address] [phone]              Discharge Instructions    Increase activity slowly   Complete by: As directed    Leave dressing on - Keep it clean, dry, and intact until clinic visit   Complete by: As directed       Diet recommendation: soft diet  Activity: The patient is advised to gradually reintroduce usual activities, as tolerated  Discharge Condition: stable  Code Status: Full code   History of present illness: As per the H and P dictated on admission, "Theresa Arnold is a 87 y.o. female with medical history significant for diverticulitis which required colostomy in past (colostomy has been reversed) who presents for evaluation of abdominal pain with persistent nausea and vomiting. She was seen a few days ago in ER for similar symptoms and was discharged home.Patient  thinks she is vomiting blood because the vomit is black. Patient reports that the vomiting is now worse. She is extremely weak and cannot walk to the bathroom without assistance. She is normally independent and is ambulatory without difficulty. Reports abdominal distention and diffuse abdominal pain. She has not had fever. She denies injury or trauma to abdomen. She has not eaten in past few days due to vomiting and could not tolerate eating. "  Hospital Course:  Summary of her active problems in the hospital is as following.   1.  SBO SP expiratory laparotomy with LOA, 1/10 Prolonged ileus NG tube out 1/17. Bowel function returned 1/20 per prior notes. Currently on soft diet. Tolerating well.  Stopping TNA as well.  2.  COPD Currently stable. Continue inhalers.  3.  Hyperglycemia. Hypokalemia. Hypophosphatemia. Hypomagnesemia Replaced   4.  Macrocytic anemia. Acute thrombocytopenia H&H relatively stable. Platelets stable. Monitor.  5.  Deconditioning Patient wants to go home with home health.  6. inadequate oral intake in the setting of medical condition. No protein calorie malnutrition   Patient was seen by physical therapy, who recommended Home Health. On the day of the discharge the patient's vitals were stable, and no other acute medical condition were reported by patient. The patient was felt safe to be discharge at Home with Home health.  Consultants: General surgery  Procedures: EXPLORATORY LAPAROTOMY LYSIS OF ADHESIONS 07/22/2020  DISCHARGE MEDICATION: Allergies as of 08/04/2020   No Known  Allergies     Medication List    STOP taking these medications   aspirin 325 MG tablet   HYDROcodone-acetaminophen 7.5-325 mg/15 ml solution Commonly known as: HYCET     TAKE these medications   acetaminophen 650 MG CR tablet Commonly known as: TYLENOL Take 1,300 mg by mouth every 8 (eight) hours as needed for pain.   docusate sodium 100 MG  capsule Commonly known as: COLACE Take 1 capsule (100 mg total) by mouth 2 (two) times daily.   feeding supplement Liqd Take 237 mLs by mouth 3 (three) times daily between meals.   guaiFENesin 600 MG 12 hr tablet Commonly known as: MUCINEX Take 1 tablet (600 mg total) by mouth 2 (two) times daily.   methocarbamol 500 MG tablet Commonly known as: ROBAXIN Take 1 tablet (500 mg total) by mouth every 8 (eight) hours as needed for muscle spasms.   ondansetron 4 MG tablet Commonly known as: Zofran Take 1 tablet (4 mg total) by mouth every 6 (six) hours as needed for nausea or vomiting.   pantoprazole 40 MG tablet Commonly known as: PROTONIX Take 1 tablet (40 mg total) by mouth 2 (two) times daily.   polyethylene glycol 17 g packet Commonly known as: MIRALAX / GLYCOLAX Take 17 g by mouth 2 (two) times daily.   traMADol 50 MG tablet Commonly known as: ULTRAM Take 1 tablet (50 mg total) by mouth every 6 (six) hours as needed for moderate pain or severe pain.            Discharge Care Instructions  (From admission, onward)         Start     Ordered   08/04/20 0000  Leave dressing on - Keep it clean, dry, and intact until clinic visit        08/04/20 1541         Discharge Exam: Filed Weights   07/19/20 1511 07/22/20 1043 07/29/20 1700  Weight: 61.2 kg 55.9 kg 60.1 kg   Vitals:   08/04/20 1337 08/04/20 2020  BP: 124/75 133/83  Pulse: 81 78  Resp:  18  Temp: 98.2 F (36.8 C) 97.8 F (36.6 C)  SpO2: 100% 99%   General: Appear in no distress, no Rash; Oral Mucosa Clear, moist. no Abnormal Neck Mass Or lumps, Conjunctiva normal  Cardiovascular: S1 and S2 Present, no Murmur Respiratory: good respiratory effort, Bilateral Air entry present and CTA, no Crackles, no wheezes Abdomen: Bowel Sound present, Soft and no tenderness Extremities: no Pedal edema Neurology: alert and oriented to time, place, and person affect appropriate. no new focal deficit  The results of  significant diagnostics from this hospitalization (including imaging, microbiology, ancillary and laboratory) are listed below for reference.    Significant Diagnostic Studies: CT ABDOMEN PELVIS W CONTRAST  Result Date: 07/20/2020 CLINICAL DATA:  Initial evaluation for acute abdominal pain and distension. EXAM: CT ABDOMEN AND PELVIS WITH CONTRAST TECHNIQUE: Multidetector CT imaging of the abdomen and pelvis was performed using the standard protocol following bolus administration of intravenous contrast. CONTRAST:  160mL OMNIPAQUE IOHEXOL 300 MG/ML  SOLN COMPARISON:  Prior CT from 03/11/2019. FINDINGS: Lower chest: Trace layering bilateral pleural effusions with associated atelectasis. Scattered coronary artery calcifications noted. Visualized lungs are otherwise clear. Hepatobiliary: Few scattered small hepatic cysts noted, stable from previous. Liver otherwise unremarkable. Gallbladder within normal limits. No biliary dilatation. Pancreas: Pancreas within normal limits. Spleen: Spleen within normal limits. Adrenals/Urinary Tract: Adrenal glands are normal. Kidneys equal size with symmetric enhancement.  Few scattered subcentimeter hypodense lesions noted within the kidneys, too small the characterize, but statistically likely reflects small cyst. No nephrolithiasis or hydronephrosis. No focal renal mass. No visible hydroureter. Partially distended bladder within normal limits. Stomach/Bowel: Mild circumferential wall thickening about the visualized esophagus. Stomach within normal limits. Multiple prominent loops of small bowel seen within the left and mid abdomen, measuring up to 3.9 cm in diameter. Associated internal air-fluid levels. Focal transition point within the right lower quadrant (series 5, image 60). Small bowel is decompressed distally. Finding consistent with a mechanical small bowel obstruction. Underlying adhesive disease is suspected. No pneumatosis or portal venous gas. Appendix is normal.  Colon is diffusely decompressed. Scattered colonic diverticulosis involving the right colon without diverticulitis. Sequelae of prior partial colectomy noted at the left lower quadrant. Vascular/Lymphatic: Moderate aorto bi-iliac atherosclerotic disease. No aneurysm. Mesenteric vessels grossly patent proximally. No adenopathy. Reproductive: Uterus within normal limits. Ovaries are atrophic in appearance bilaterally. Few small dystrophic calcifications noted involving the left ovary. No adnexal mass. Other: Trace free fluid seen adjacent to the dilated loops of bowel as well as adjacent to the spleen, likely reactive. No free air. Small fat containing paraumbilical hernia noted. Musculoskeletal: No acute osseous finding. No discrete or worrisome osseous lesions. IMPRESSION: 1. Findings consistent with a mechanical small bowel obstruction with transition point in the right lower quadrant. Underlying adhesive disease is suspected. 2. Trace layering bilateral pleural effusions with associated atelectasis. 3. Colonic diverticulosis without diverticulitis. 4. Aortic Atherosclerosis (ICD10-I70.0). Electronically Signed   By: Jeannine Boga M.D.   On: 07/20/2020 02:47   DG Abd Portable 1V  Result Date: 07/31/2020 CLINICAL DATA:  Postoperative ileus EXAM: PORTABLE ABDOMEN - 1 VIEW COMPARISON:  July 30, 2020 FINDINGS: Skin staples again noted over the lower abdomen and pelvis. There is currently no appreciable bowel dilatation or air-fluid level to suggest bowel obstruction. Contrast noted in the proximal colon. No free air. There is aortic atherosclerosis. IMPRESSION: There is no longer appreciable bowel dilatation. No findings suggesting ileus or bowel obstruction. No free air evident on supine examination. Skin staples noted. Aortic Atherosclerosis (ICD10-I70.0). Electronically Signed   By: Lowella Grip III M.D.   On: 07/31/2020 11:26   DG Abd Portable 1V  Result Date: 07/30/2020 CLINICAL DATA:   Follow-up ileus. EXAM: PORTABLE ABDOMEN - 1 VIEW COMPARISON:  One-view abdomen 07/26/2020. FINDINGS: Enteric tube was removed.  Skin staples remain. Single mildly dilated loop of small bowel is present in the left central abdomen. Normal colonic gas is present. Bowel gas pattern is otherwise normal. Lung bases are clear. Atherosclerotic calcifications are present. IMPRESSION: Single mildly dilated loop of small bowel in the left central abdomen compatible with ileus. Electronically Signed   By: San Morelle M.D.   On: 07/30/2020 07:58   DG Abd Portable 1V  Result Date: 07/26/2020 CLINICAL DATA:  Postoperative ileus EXAM: PORTABLE ABDOMEN - 1 VIEW COMPARISON:  07/22/2020 FINDINGS: Nasogastric tube in the fundus of the stomach. Ordinary amount of gas within the small and large bowel. No evidence of frank ileus presently. IMPRESSION: Nasogastric tube in place.  Gas pattern unremarkable. Electronically Signed   By: Nelson Chimes M.D.   On: 07/26/2020 10:26   DG Abd Portable 1V  Result Date: 07/22/2020 CLINICAL DATA:  Small-bowel obstruction EXAM: PORTABLE ABDOMEN - 1 VIEW COMPARISON:  Portable exam 0752 hours compared to 07/20/2020 FINDINGS: Persistent significant diffuse small bowel dilatation. No bowel wall thickening. Paucity of colonic gas. Findings consistent with small-bowel obstruction. Nasogastric  tube coiled in stomach. Small amount of excreted contrast in bladder. Bones demineralized. IMPRESSION: Persistent small bowel obstruction. Electronically Signed   By: Lavonia Dana M.D.   On: 07/22/2020 08:27   DG Abd Portable 1V-Small Bowel Obstruction Protocol-initial, 8 hr delay  Result Date: 07/20/2020 CLINICAL DATA:  Small bowel obstruction.  8 hour delayed film. EXAM: PORTABLE ABDOMEN - 1 VIEW COMPARISON:  Abdominal radiograph and CT earlier today. FINDINGS: Tip and side port of the enteric tube below the diaphragm in the stomach. Persisting gaseous small bowel distension from earlier today.  Contrast in the pelvis is likely excreted IV contrast in the urinary bladder from recent CT. There is no enteric contrast in the colon. No evidence of free air. IMPRESSION: 1. Persistent small bowel distension consistent with obstruction. There is no enteric contrast in the colon. 2. Contrast in the pelvis is likely excreted IV contrast in the urinary bladder from CT earlier today. Electronically Signed   By: Keith Rake M.D.   On: 07/20/2020 22:39   DG Abd Portable 1V-Small Bowel Protocol-Position Verification  Result Date: 07/20/2020 CLINICAL DATA:  Status post NG tube placement. EXAM: PORTABLE ABDOMEN - 1 VIEW COMPARISON:  CT abdomen from earlier same day. FINDINGS: Nasogastric tube a is positioned within the stomach, with proximal side holes just below the level of the gastroesophageal junction. Distended gas-filled small bowel loops are seen throughout the abdomen and pelvis, compatible with obstruction. IMPRESSION: 1. Nasogastric tube is positioned within the stomach, but proximal side holes are located at or just below the level of the gastroesophageal junction. Consider advancing the nasogastric tube 5-10 cm for optimal radiographic positioning. 2. Small bowel obstruction. Electronically Signed   By: Franki Cabot M.D.   On: 07/20/2020 13:04   DG [place] Dys Tube Plc W/Fl W/Rad  Result Date: 07/20/2020 CLINICAL DATA:  Nasogastric placement unsuccessful on the floor. Mechanical small bowel obstruction. EXAM: NASO G TUBE PLACEMENT WITH FL AND WITH RAD CONTRAST:  None FLUOROSCOPY TIME:  Fluoroscopy Time: 4 minutes 6 seconds. 903.15 micro gray meter squared COMPARISON:  None. FINDINGS: Nasogastric tube was placed through the left nostril, reaching the stomach without difficulty. I was unable to direct the nasogastric tube into the proximal small intestine and the tube was secured in place with its tip in the gastric fundus. IMPRESSION: Nasogastric tube placed through the left nostril with the tip in the  gastric fundus. I was unable to direct the tube into the small intestine. Electronically Signed   By: Nelson Chimes M.D.   On: 07/20/2020 13:13   [place] EKG SITE RITE  Result Date: 07/22/2020 If Site Rite image not attached, placement could not be confirmed due to current cardiac rhythm.  [place] EKG SITE RITE  Result Date: 07/22/2020 If Site Rite image not attached, placement could not be confirmed due to current cardiac rhythm.   Microbiology: No results found for this or any previous visit (from the past 240 hour(s)).   Labs: CBC: Recent Labs  Lab 08/01/20 0341  WBC 10.4  NEUTROABS 8.0*  HGB 8.2*  HCT 25.1*  MCV 111.6*  PLT [id number]   Basic Metabolic Panel: Recent Labs  Lab 07/30/20 0333 07/31/20 0414 08/01/20 0341 08/04/20 0305  NA 134* 135 135 137  K 4.5 4.7 4.6 3.7  CL 98 102 104 106  CO2 27 25 23 22   GLUCOSE 136* 137* 152* 91  BUN 14 19 17 20   CREATININE 0.47 0.48 0.44 0.62  CALCIUM 8.7* 8.7* 8.8* 8.6*  MG 2.1  --  2.2 2.0  PHOS 4.1  --  3.7  --    Liver Function Tests: Recent Labs  Lab 08/01/20 0341  AST 43*  ALT 32  ALKPHOS 75  BILITOT 0.4  PROT 6.2*  ALBUMIN 2.7*   CBG: Recent Labs  Lab 08/02/20 1719 08/02/20 2049 08/03/20 0751 08/03/20 1156 08/03/20 1646  GLUCAP 85 121* 126* 117* 91    Time spent: 35 minutes  Signed:  Berle Mull  [facility] 08/04/2020 2:00 AM

## 2020-08-13 ENCOUNTER — Inpatient Hospital Stay: Payer: BC Managed Care – PPO | Admitting: Internal Medicine

## 2020-08-15 ENCOUNTER — Encounter: Payer: Self-pay | Admitting: Internal Medicine

## 2020-08-15 ENCOUNTER — Ambulatory Visit (INDEPENDENT_AMBULATORY_CARE_PROVIDER_SITE_OTHER): Payer: Medicare Other | Admitting: Internal Medicine

## 2020-08-15 ENCOUNTER — Other Ambulatory Visit: Payer: Self-pay

## 2020-08-15 VITALS — BP 130/80 | HR 93 | Temp 98.1°F | Ht 60.0 in | Wt 120.0 lb

## 2020-08-15 DIAGNOSIS — R5381 Other malaise: Secondary | ICD-10-CM | POA: Diagnosis not present

## 2020-08-15 DIAGNOSIS — J449 Chronic obstructive pulmonary disease, unspecified: Secondary | ICD-10-CM | POA: Diagnosis not present

## 2020-08-15 DIAGNOSIS — G8929 Other chronic pain: Secondary | ICD-10-CM | POA: Diagnosis not present

## 2020-08-15 DIAGNOSIS — R269 Unspecified abnormalities of gait and mobility: Secondary | ICD-10-CM | POA: Diagnosis not present

## 2020-08-15 DIAGNOSIS — K56609 Unspecified intestinal obstruction, unspecified as to partial versus complete obstruction: Secondary | ICD-10-CM | POA: Diagnosis not present

## 2020-08-15 DIAGNOSIS — R531 Weakness: Secondary | ICD-10-CM

## 2020-08-15 MED ORDER — TRAMADOL HCL 50 MG PO TABS: 50.0000 mg | ORAL_TABLET | Freq: Four times a day (QID) | ORAL | 2 refills | Status: DC | PRN

## 2020-08-15 NOTE — Progress Notes (Signed)
Established Patient Office Visit  Subjective:  Patient ID: Theresa Arnold, female    DOB: Sep 03, 1931  Age: 87 y.o. MRN: [id number]      Chief Complaint: follow up SBO, gait d/o, and chronic pain in TCM visit       HPI:  Theresa Arnold is a 87 y.o. female here to f/u recent hospn 1/7 - 1/23 with SBO after hx of reversed colostomy and persistent n/v, generalized weakness, unable to walk without assist, c/o persistent abd pain and distension and unable to take po.  Pt was admitted and now s/p eploratory laparotomy with LOA on jan 10, complicated by prolonged ileus, NG removed jan 17, bowel function returned jan 20, tolerated soft diet and TNA stopped at time of d/c.  COPD remained stable, all electrolytes abnormalities resolved and Hgb stable.  Patient was seen by physical therapy, who recommended Home Health.  Here today with son, and states at home post d/c has done relatively well without worsening GI function - Denies worsening reflux, abd pain, dysphagia, n/v, bowel change or blood.  Denies other new complaints .        Wt Readings from Last 3 Encounters:  08/15/20 120 lb (54.4 kg)  07/29/20 132 lb 7.9 oz (60.1 kg)  09/09/19 113 lb 1.5 oz (51.3 kg)   BP Readings from Last 3 Encounters:  08/15/20 130/80  08/04/20 133/83  07/17/20 131/84        Transitional Care Management elements noted today: 1)  Date of D/C: as above 2)  Medication reconciliation:  done today at end visit 3)  Review of D/C summary or other information:  done today 4)  Review of need for f/u on pending diagnostic tests and treatments:  done today 5)  Review of need for Interaction with other providers who will assume or resume care of pt specific problems: done today 6)  Education of patient/family/guardian or caregiver: done today  Past Medical History:  Diagnosis Date  . CERVICAL RADICULOPATHY, LEFT 09/03/2010  . Cervical spondylosis 11/10/2011  . COPD (chronic obstructive pulmonary disease) ([place]) 11/10/2011  .  Diverticulitis   . HYPERLIPIDEMIA 09/03/2010  . Sigmoid stricture [facility])    Past Surgical History:  Procedure Laterality Date  . COLONOSCOPY    . COLOSTOMY REVERSAL N/A 09/04/2019   Procedure: PRIMARY COLOCOLOSTOMY REVERSAL AND TAKEDOWN OF HARTMAN;  Surgeon: [name], MD;  Location: [facility];  Service: General;  Laterality: N/A;  . EYE SURGERY     bilateral cataracts with lens implants  . LAPAROSCOPIC PARTIAL COLECTOMY N/A 03/22/2019   Procedure: LAPAROSCOPY, SPLENIC FLEXURE MOBILIZATION WITH OPEN HARTMANN PROCEDURE;  Surgeon: [name], MD;  Location: [facility];  Service: General;  Laterality: N/A;  . LAPAROTOMY N/A 07/22/2020   Procedure: EXPLORATORY LAPAROTOMY lysis of adhesions;  Surgeon: Coralie Keens, MD;  Location: [facility];  Service: General;  Laterality: N/A;    reports that she quit smoking about 9 years ago. Her smoking use included cigarettes. She has a 30.00 pack-year smoking history. She has never used smokeless tobacco. She reports current alcohol use. She reports that she does not use drugs. family history includes Breast cancer in her sister; Colon cancer in her sister; Heart disease in her father; Hip fracture in her mother; Hypertension in an other family member. No Known Allergies Current Outpatient Medications on File Prior to Visit  Medication Sig Dispense Refill  . acetaminophen (TYLENOL) 650 MG CR tablet Take 1,300 mg by mouth every 8 (eight) hours as needed  for pain. (Patient not taking: Reported on 07/20/2020)    . docusate sodium (COLACE) 100 MG capsule Take 1 capsule (100 mg total) by mouth 2 (two) times daily. 10 capsule 0  . feeding supplement (ENSURE ENLIVE / ENSURE PLUS) LIQD Take 237 mLs by mouth 3 (three) times daily between meals. 10000 mL 0  . guaiFENesin (MUCINEX) 600 MG 12 hr tablet Take 1 tablet (600 mg total) by mouth 2 (two) times daily. 30 tablet 0  . methocarbamol (ROBAXIN) 500 MG tablet Take 1 tablet (500 mg total) by mouth every 8 (eight) hours  as needed for muscle spasms. 30 tablet 0  . ondansetron (ZOFRAN) 4 MG tablet Take 1 tablet (4 mg total) by mouth every 6 (six) hours as needed for nausea or vomiting. (Patient not taking: Reported on 07/20/2020) 30 tablet 1  . pantoprazole (PROTONIX) 40 MG tablet Take 1 tablet (40 mg total) by mouth 2 (two) times daily. 60 tablet 0  . polyethylene glycol (MIRALAX / GLYCOLAX) 17 g packet Take 17 g by mouth 2 (two) times daily. 14 each 0   No current facility-administered medications on file prior to visit.        ROS:  All others reviewed and negative.  Objective        PE:  BP 130/80   Pulse 93   Temp 98.1 F (36.7 C) (Oral)   Ht 5' (1.524 m)   Wt 120 lb (54.4 kg)   SpO2 97%   BMI 23.44 kg/m                 Constitutional: Pt appears in NAD               HENT: Head: NCAT.                Right Ear: External ear normal.                 Left Ear: External ear normal.                Eyes: . Pupils are equal, round, and reactive to light. Conjunctivae and EOM are normal               Nose: without d/c or deformity               Neck: Neck supple. Gross normal ROM               Cardiovascular: Normal rate and regular rhythm.                 Pulmonary/Chest: Effort normal and breath sounds without rales or wheezing.                Abd:  Soft, NT, ND, + BS, no organomegaly               Neurological: Pt is alert. At baseline orientation, motor grossly intact               Skin: Skin is warm. No rashes, no other new lesions, LE edema - none               Psychiatric: Pt behavior is normal without agitation   Micro: none  Cardiac tracings I have personally interpreted today:  none  Pertinent Radiological findings (summarize): none   Lab Results  Component Value Date   WBC 10.4 08/01/2020   HGB 8.2 (L) 08/01/2020   HCT 25.1 (L) 08/01/2020   PLT 299 08/01/2020  GLUCOSE 91 08/04/2020   CHOL 209 (H) 11/24/2017   TRIG 98 07/29/2020   HDL 76.30 11/24/2017   LDLDIRECT 142.7  11/10/2011   LDLCALC 118 (H) 11/24/2017   ALT 32 08/01/2020   AST 43 (H) 08/01/2020   NA 137 08/04/2020   K 3.7 08/04/2020   CL 106 08/04/2020   CREATININE 0.62 08/04/2020   BUN 20 08/04/2020   CO2 22 08/04/2020   TSH 0.75 11/24/2017   INR 0.97 04/18/2018   HGBA1C 5.8 (H) 09/01/2019   Assessment/Plan:  Theresa Arnold is a 87 y.o. Black or African American [2] female with  has a past medical history of CERVICAL RADICULOPATHY, LEFT (09/03/2010), Cervical spondylosis (11/10/2011), COPD (chronic obstructive pulmonary disease) ([place]) (11/10/2011), Diverticulitis, HYPERLIPIDEMIA (09/03/2010), and Sigmoid stricture ([place]).  COPD (chronic obstructive pulmonary disease) ([facility]) Stable overall, to continue current med tx - none   Debility Improved overall, to start home PT per son request  Gait disorder Walks with cane, will contineu for safety  Generalized weakness Mild persistent but overall walking mostly without assist today, has good support at home,  to f/u any worsening symptoms or concerns  Other chronic pain Ok for tramadol refill prn with limited use prn only,  to f/u any worsening symptoms or concerns  SBO (small bowel obstruction) ([place]) Resolved, to f/u general surgury as needed  Followup: Return in about 4 months (around 12/13/2020).  Cathlean Cower, MD 08/18/2020 2:33 AM [facility]

## 2020-08-15 NOTE — Patient Instructions (Addendum)
Please continue all other medications as before, and refills have been done if requested - the tramadol  Please have the pharmacy call with any other refills you may need.  Please continue your efforts at being more active, low cholesterol diet, and weight control.  You are otherwise up to date with prevention measures today.  Please keep your appointments with your specialists as you may have planned - Dr Ninfa Linden next week to have the staples out  You will be contacted regarding the referral for: Home Physical Therapy  Please make an Appointment to return in 4 months, or sooner if needed

## 2020-08-16 ENCOUNTER — Telehealth: Payer: Self-pay | Admitting: Internal Medicine

## 2020-08-16 NOTE — Telephone Encounter (Signed)
Patient called and said that the pharmacy would not fill traMADol (ULTRAM) 50 MG tablet, she said because Dr. Jenny Reichmann is not the original prescriber for the medication. She was wondering if something else could be called in.    It can be sent to [id number] - [place], [place]

## 2020-08-17 MED ORDER — TRAMADOL HCL 50 MG PO TABS
50.0000 mg | ORAL_TABLET | Freq: Four times a day (QID) | ORAL | 0 refills | Status: DC | PRN
Start: 2020-08-17 — End: 2022-08-13

## 2020-08-17 NOTE — Telephone Encounter (Signed)
I dont really have anything else, b/c I would not want to prescribe stronger narcotic  I did send a smaller 1 wk type of prescription for the tramadol  Ok to ask pt to see if pharmacy will be ok with that as for acute treatment

## 2020-08-18 ENCOUNTER — Encounter: Payer: Self-pay | Admitting: Internal Medicine

## 2020-08-18 NOTE — Assessment & Plan Note (Addendum)
Improved overall, to start home PT per son request

## 2020-08-18 NOTE — Assessment & Plan Note (Signed)
Mild persistent but overall walking mostly without assist today, has good support at home,  to f/u any worsening symptoms or concerns

## 2020-08-18 NOTE — Assessment & Plan Note (Signed)
Ok for tramadol refill prn with limited use prn only,  to f/u any worsening symptoms or concerns

## 2020-08-18 NOTE — Assessment & Plan Note (Signed)
Resolved, to f/u general surgury as needed

## 2020-08-18 NOTE — Assessment & Plan Note (Addendum)
Stable overall, to continue current med tx - none

## 2020-08-18 NOTE — Assessment & Plan Note (Signed)
Walks with cane, will contineu for safety

## 2020-08-19 ENCOUNTER — Telehealth: Payer: Self-pay

## 2020-08-19 NOTE — Telephone Encounter (Signed)
Pt.notified

## 2020-08-19 NOTE — Telephone Encounter (Signed)
Prior Auth for tramadol   Key: [id number]

## 2020-08-20 ENCOUNTER — Telehealth: Payer: Self-pay

## 2020-08-20 NOTE — Telephone Encounter (Addendum)
PA was approved for tramadol 50mg  08/19/2020-02/16/2021

## 2020-09-09 DIAGNOSIS — M25562 Pain in left knee: Secondary | ICD-10-CM | POA: Diagnosis not present

## 2020-09-17 ENCOUNTER — Telehealth: Payer: Self-pay | Admitting: Internal Medicine

## 2020-09-17 NOTE — Telephone Encounter (Signed)
FYI: HH was not able to get a hold of patient and they have noted her as a non admit.  thanks

## 2020-12-12 ENCOUNTER — Ambulatory Visit: Payer: BC Managed Care – PPO | Admitting: Internal Medicine

## 2020-12-16 DIAGNOSIS — L218 Other seborrheic dermatitis: Secondary | ICD-10-CM | POA: Diagnosis not present

## 2021-01-28 DIAGNOSIS — L218 Other seborrheic dermatitis: Secondary | ICD-10-CM | POA: Diagnosis not present

## 2021-01-28 DIAGNOSIS — L650 Telogen effluvium: Secondary | ICD-10-CM | POA: Diagnosis not present

## 2021-02-10 ENCOUNTER — Encounter: Payer: Self-pay | Admitting: Gastroenterology

## 2021-02-27 DIAGNOSIS — Z961 Presence of intraocular lens: Secondary | ICD-10-CM | POA: Diagnosis not present

## 2021-02-27 DIAGNOSIS — H3509 Other intraretinal microvascular abnormalities: Secondary | ICD-10-CM | POA: Diagnosis not present

## 2021-02-27 DIAGNOSIS — H353121 Nonexudative age-related macular degeneration, left eye, early dry stage: Secondary | ICD-10-CM | POA: Diagnosis not present

## 2021-02-27 DIAGNOSIS — H353211 Exudative age-related macular degeneration, right eye, with active choroidal neovascularization: Secondary | ICD-10-CM | POA: Diagnosis not present

## 2021-05-25 ENCOUNTER — Emergency Department (HOSPITAL_BASED_OUTPATIENT_CLINIC_OR_DEPARTMENT_OTHER)
Admission: EM | Admit: 2021-05-25 | Discharge: 2021-05-25 | Disposition: A | Discharge status: Home / Self Care | Payer: Medicare Other | Attending: Emergency Medicine | Admitting: Emergency Medicine

## 2021-05-25 ENCOUNTER — Encounter (HOSPITAL_BASED_OUTPATIENT_CLINIC_OR_DEPARTMENT_OTHER): Payer: Self-pay | Admitting: Obstetrics and Gynecology

## 2021-05-25 ENCOUNTER — Encounter: Payer: Self-pay | Admitting: *Deleted

## 2021-05-25 ENCOUNTER — Emergency Department (HOSPITAL_BASED_OUTPATIENT_CLINIC_OR_DEPARTMENT_OTHER): Payer: Medicare Other

## 2021-05-25 ENCOUNTER — Other Ambulatory Visit: Payer: Self-pay

## 2021-05-25 ENCOUNTER — Ambulatory Visit
Admission: EM | Admit: 2021-05-25 | Discharge: 2021-05-25 | Disposition: A | Discharge status: Home / Self Care | Payer: Medicare Other

## 2021-05-25 DIAGNOSIS — Z87891 Personal history of nicotine dependence: Secondary | ICD-10-CM | POA: Diagnosis not present

## 2021-05-25 DIAGNOSIS — R110 Nausea: Secondary | ICD-10-CM | POA: Insufficient documentation

## 2021-05-25 DIAGNOSIS — K1120 Sialoadenitis, unspecified: Secondary | ICD-10-CM

## 2021-05-25 DIAGNOSIS — R519 Headache, unspecified: Secondary | ICD-10-CM | POA: Diagnosis not present

## 2021-05-25 DIAGNOSIS — K118 Other diseases of salivary glands: Secondary | ICD-10-CM | POA: Diagnosis not present

## 2021-05-25 DIAGNOSIS — R220 Localized swelling, mass and lump, head: Secondary | ICD-10-CM | POA: Diagnosis not present

## 2021-05-25 DIAGNOSIS — J449 Chronic obstructive pulmonary disease, unspecified: Secondary | ICD-10-CM | POA: Diagnosis not present

## 2021-05-25 LAB — COMPREHENSIVE METABOLIC PANEL
ALT: 6 U/L (ref 0–44)
AST: 21 U/L (ref 15–41)
Albumin: 4 g/dL (ref 3.5–5.0)
Alkaline Phosphatase: 57 U/L (ref 38–126)
Anion gap: 8 (ref 5–15)
BUN: 13 mg/dL (ref 8–23)
CO2: 27 mmol/L (ref 22–32)
Calcium: 9.9 mg/dL (ref 8.9–10.3)
Chloride: 104 mmol/L (ref 98–111)
Creatinine, Ser: 0.77 mg/dL (ref 0.44–1.00)
GFR, Estimated: >60 mL/min (ref >60)
Glucose, Bld: 85 mg/dL (ref 70–99)
Potassium: 3.9 mmol/L (ref 3.5–5.1)
Sodium: 139 mmol/L (ref 135–145)
Total Bilirubin: 0.4 mg/dL (ref 0.3–1.2)
Total Protein: 7.3 g/dL (ref 6.5–8.1)

## 2021-05-25 LAB — CBC WITH DIFFERENTIAL/PLATELET
Abs Immature Granulocytes: 0.01 10*3/uL (ref 0.00–0.07)
Basophils Absolute: 0.1 10*3/uL (ref 0.0–0.1)
Basophils Relative: 1 %
Eosinophils Absolute: 0.2 10*3/uL (ref 0.0–0.5)
Eosinophils Relative: 2 %
HCT: 39.1 % (ref 36.0–46.0)
Hemoglobin: 12.2 g/dL (ref 12.0–15.0)
Immature Granulocytes: 0 %
Lymphocytes Relative: 31 %
Lymphs Abs: 2.7 10*3/uL (ref 0.7–4.0)
MCH: 28.2 pg (ref 26.0–34.0)
MCHC: 31.2 g/dL (ref 30.0–36.0)
MCV: 90.3 fL (ref 80.0–100.0)
Monocytes Absolute: 0.6 10*3/uL (ref 0.1–1.0)
Monocytes Relative: 6 %
Neutro Abs: 5.2 10*3/uL (ref 1.7–7.7)
Neutrophils Relative %: 60 %
Platelets: 169 10*3/uL (ref 150–400)
RBC: 4.33 MIL/uL (ref 3.87–5.11)
RDW: 16.8 % — ABNORMAL HIGH (ref 11.5–15.5)
WBC: 8.7 10*3/uL (ref 4.0–10.5)
nRBC: 0 % (ref 0.0–0.2)

## 2021-05-25 MED ORDER — IOHEXOL 300 MG/ML  SOLN
100.0000 mL | Freq: Once | INTRAMUSCULAR | Status: AC | PRN
  Administered 2021-05-25: 75 mL via INTRAVENOUS

## 2021-05-25 MED ORDER — AMOXICILLIN-POT CLAVULANATE 875-125 MG PO TABS
1.0000 | ORAL_TABLET | Freq: Once | ORAL | Status: AC
  Administered 2021-05-25: 1 via ORAL
  Filled 2021-05-25: qty 1

## 2021-05-25 MED ORDER — AMOXICILLIN-POT CLAVULANATE 875-125 MG PO TABS: 1.0000 | ORAL_TABLET | Freq: Once | ORAL | Status: DC

## 2021-05-25 MED ORDER — AMOXICILLIN-POT CLAVULANATE 875-125 MG PO TABS: 1.0000 | ORAL_TABLET | Freq: Two times a day (BID) | ORAL | 0 refills | Status: AC

## 2021-05-25 NOTE — Discharge Instructions (Addendum)
Follow up with your Physician for recheck in 2-3 days

## 2021-05-25 NOTE — ED Triage Notes (Signed)
Pt reports facia swelling to Rt side of face. Pt also has a HA.

## 2021-05-25 NOTE — ED Provider Notes (Signed)
[facility]    CSN: [id number] Arrival date & time: 05/25/21  1446      History   Chief Complaint Chief Complaint  Patient presents with   Facial Swelling    HPI Theresa Arnold is a 87 y.o. female.   Patient here today for evaluation of swelling to the right side of her face that started earlier today.  She reports there is some tenderness to palpation to the area but otherwise is not seemingly painful.  She would like a scan as she is concerned there is something wrong.  She has not had fever or chills.    The history is provided by the patient.   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[facility])     Patient Active Problem List   Diagnosis Date Noted   Other chronic pain 08/15/2020   Gait disorder 08/15/2020   Generalized weakness 08/15/2020   Hyperglycemia 08/02/2020   Ileus, postoperative ([place]) 08/02/2020   Macrocytic anemia 08/02/2020   Hypophosphatemia 08/02/2020   Hypomagnesemia 08/02/2020   Physical deconditioning 08/02/2020   Protein calorie malnutrition ([place]) 08/02/2020   SBO (small bowel obstruction) ([place]) 07/20/2020   Thrombocytopenia ([place]) 07/20/2020   Nausea and vomiting 07/20/2020   Hypokalemia 07/20/2020   S/P colostomy takedown 09/04/2019   Nausea 06/23/2019   Abnormal LFTs 06/11/2019   Acute blood loss anemia 06/11/2019   Hypoxia 03/24/2019   Colonic partial obstruction from sigmoid stricture 03/18/2019   Polyp of sigmoid colon 03/18/2019   Oral thrush 03/18/2019   Abnormal CT scan, gastrointestinal tract    Abnormal CT scan, sigmoid colon    Diverticulitis of sigmoid colon 03/15/2019   LLQ pain 11/02/2018   Abdominal pain 09/21/2018   Venous (peripheral) insufficiency 01/12/2014   Sprain of ankle, unspecified site 06/28/2013    Preventative health care 11/10/2011   Cervical spondylosis 11/10/2011   OA (osteoarthritis) of knee 11/10/2011   COPD (chronic obstructive pulmonary disease) ([place]) 11/10/2011   Dizziness 11/10/2011   Benign neoplasm of colon 11/24/2010   Diverticulosis of colon 11/24/2010   Encounter for long-term (current) use of other medications 10/07/2010   HLD (hyperlipidemia) 09/03/2010   Debility 09/03/2010   SMOKER 09/10/2009    Past Surgical History:  Procedure Laterality Date   COLONOSCOPY     COLOSTOMY REVERSAL N/A 09/04/2019   Procedure: PRIMARY COLOCOLOSTOMY REVERSAL AND TAKEDOWN OF HARTMAN;  Surgeon: [name], MD;  Location: [facility];  Service: General;  Laterality: N/A;   EYE SURGERY     bilateral cataracts with lens implants   LAPAROSCOPIC PARTIAL COLECTOMY N/A 03/22/2019   Procedure: LAPAROSCOPY, SPLENIC FLEXURE MOBILIZATION WITH OPEN HARTMANN PROCEDURE;  Surgeon: [name], MD;  Location: [facility];  Service: General;  Laterality: N/A;   LAPAROTOMY N/A 07/22/2020   Procedure: EXPLORATORY LAPAROTOMY lysis of adhesions;  Surgeon: Coralie Keens, MD;  Location: [facility];  Service: General;  Laterality: N/A;    OB History   No obstetric history on file.      Home Medications    Prior to Admission medications   Medication Sig Start Date End Date Taking? Authorizing Provider  acetaminophen (TYLENOL) 650 MG CR tablet Take 1,300 mg by mouth every 8 (eight) hours as needed for pain. Patient not taking: Reported on 07/20/2020    [provider]  docusate sodium (  COLACE) 100 MG capsule Take 1 capsule (100 mg total) by mouth 2 (two) times daily. 08/04/20   Lavina Hamman, MD  feeding supplement (ENSURE ENLIVE / ENSURE PLUS) LIQD Take 237 mLs by mouth 3 (three) times daily between meals. 08/04/20   Lavina Hamman, MD  guaiFENesin (MUCINEX) 600 MG 12 hr tablet Take 1 tablet (600 mg total) by mouth 2 (two) times daily. 08/04/20   Lavina Hamman, MD  methocarbamol (ROBAXIN) 500  MG tablet Take 1 tablet (500 mg total) by mouth every 8 (eight) hours as needed for muscle spasms. 08/04/20   Lavina Hamman, MD  ondansetron (ZOFRAN) 4 MG tablet Take 1 tablet (4 mg total) by mouth every 6 (six) hours as needed for nausea or vomiting. Patient not taking: Reported on 07/20/2020 07/17/20 07/17/21  [name], MD  pantoprazole (PROTONIX) 40 MG tablet Take 1 tablet (40 mg total) by mouth 2 (two) times daily. 08/04/20 09/04/20  Lavina Hamman, MD  polyethylene glycol (MIRALAX / GLYCOLAX) 17 g packet Take 17 g by mouth 2 (two) times daily. 08/04/20   Lavina Hamman, MD  traMADol (ULTRAM) 50 MG tablet Take 1 tablet (50 mg total) by mouth every 6 (six) hours as needed for moderate pain or severe pain. 08/17/20   Biagio Borg, MD    Family History Family History  Problem Relation Age of Onset   Colon cancer Sister    Breast cancer Sister    Heart disease Father    Hip fracture Mother    Hypertension Other     Social History Social History   Tobacco Use   Smoking status: Former    Packs/day: 1.00    Years: 30.00    Pack years: 30.00    Types: Cigarettes    Quit date: 07/14/2011    Years since quitting: 9.8   Smokeless tobacco: Never   Tobacco comments:    QUIT 2013   Vaping Use   Vaping Use: Never used  Substance Use Topics   Alcohol use: Yes    Comment: wine occasionally   Drug use: No     Allergies   Patient has no known allergies.   Review of Systems Review of Systems  Constitutional:  Negative for chills and fever.  HENT:  Positive for facial swelling. Negative for dental problem.   Eyes:  Negative for discharge and redness.  Gastrointestinal:  Negative for abdominal pain, nausea and vomiting.    Physical Exam Triage Vital Signs ED Triage Vitals  Enc Vitals Group     BP 05/25/21 1453 (!) 144/68     Pulse Rate 05/25/21 1453 69     Resp 05/25/21 1453 18     Temp 05/25/21 1453 98.3 F (36.8 C)     Temp src --      SpO2 05/25/21 1453 95 %     Weight  --      Height --      Head Circumference --      Peak Flow --      Pain Score 05/25/21 1454 10     Pain Loc --      Pain Edu? --      Excl. in [place]? --    No data found.  Updated Vital Signs BP (!) 144/68   Pulse 69   Temp 98.3 F (36.8 C)   Resp 18   SpO2 95%     Physical Exam Vitals and nursing note reviewed.  Constitutional:  General: She is not in acute distress.    Appearance: Normal appearance. She is not ill-appearing.  HENT:     Head: Normocephalic and atraumatic.     Comments: Swelling noted to right TMJ area spreading toward midline, no erythema or warmth appreciated. Mild TTP.  Eyes:     Conjunctiva/sclera: Conjunctivae normal.  Cardiovascular:     Rate and Rhythm: Normal rate.  Pulmonary:     Effort: Pulmonary effort is normal.  Neurological:     Mental Status: She is alert.  Psychiatric:        Mood and Affect: Mood normal.        Behavior: Behavior normal.        Thought Content: Thought content normal.     UC Treatments / Results  Labs (all labs ordered are listed, but only abnormal results are displayed) Labs Reviewed - No data to display  EKG   Radiology No results found.  Procedures Procedures (including critical care time)  Medications Ordered in UC Medications - No data to display  Initial Impression / Assessment and Plan / UC Course  I have reviewed the triage vital signs and the nursing notes.  Pertinent labs & imaging results that were available during my care of the patient were reviewed by me and considered in my medical decision making (see chart for details).    Given patient is requesting imaging and further work up recommended further evaluation in the ED.   Final Clinical Impressions(s) / UC Diagnoses   Final diagnoses:  Facial swelling     Discharge Instructions      Please report to [place] at [facility] after leaving the office:  Address: [place],  [place]   ED Prescriptions   None    PDMP not reviewed this encounter.   Francene Finders, PA-C 05/25/21 1517

## 2021-05-25 NOTE — ED Triage Notes (Signed)
Patient presents to the ER for headaches and right sided facial swelling

## 2021-05-25 NOTE — ED Notes (Signed)
Pt verbalizes understanding of discharge instructions. Opportunity for questioning and answers were provided. Armand removed by staff, pt discharged from ED to home. Educated to pick up Rx.  

## 2021-05-25 NOTE — Discharge Instructions (Signed)
Please report to [place] at [facility] after leaving the office:  Address: [place], [place]

## 2021-05-25 NOTE — ED Provider Notes (Signed)
[facility] Provider Note   CSN: [id number] Arrival date & time: 05/25/21  1533     History Chief Complaint  Patient presents with   Headache    Theresa Arnold is a 87 y.o. female.  Pt complains of swelling to the right side of her face and a headache.  Pt reports face is sore around ear.  Pt denies dental issue.  No fever or chills   The history is provided by the patient. No language interpreter was used.  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[facility])     Patient Active Problem List   Diagnosis Date Noted   Other chronic pain 08/15/2020   Gait disorder 08/15/2020   Generalized weakness 08/15/2020   Hyperglycemia 08/02/2020   Ileus, postoperative ([place]) 08/02/2020   Macrocytic anemia 08/02/2020   Hypophosphatemia 08/02/2020   Hypomagnesemia 08/02/2020   Physical deconditioning 08/02/2020   Protein calorie malnutrition ([place]) 08/02/2020   SBO (small bowel obstruction) ([place]) 07/20/2020   Thrombocytopenia ([place]) 07/20/2020   Nausea and vomiting 07/20/2020   Hypokalemia 07/20/2020   S/P colostomy takedown 09/04/2019   Nausea 06/23/2019   Abnormal LFTs 06/11/2019   Acute blood loss anemia 06/11/2019   Hypoxia 03/24/2019   Colonic partial obstruction from sigmoid stricture 03/18/2019   Polyp of sigmoid colon 03/18/2019   Oral thrush 03/18/2019   Abnormal CT scan, gastrointestinal tract    Abnormal CT scan, sigmoid colon    Diverticulitis of sigmoid  colon 03/15/2019   LLQ pain 11/02/2018   Abdominal pain 09/21/2018   Venous (peripheral) insufficiency 01/12/2014   Sprain of ankle, unspecified site 06/28/2013   Preventative health care 11/10/2011   Cervical spondylosis 11/10/2011   OA (osteoarthritis) of knee 11/10/2011   COPD (chronic obstructive pulmonary disease) ([place]) 11/10/2011   Dizziness 11/10/2011   Benign neoplasm of colon 11/24/2010   Diverticulosis of colon 11/24/2010   Encounter for long-term (current) use of other medications 10/07/2010   HLD (hyperlipidemia) 09/03/2010   Debility 09/03/2010   SMOKER 09/10/2009    Past Surgical History:  Procedure Laterality Date   COLONOSCOPY     COLOSTOMY REVERSAL N/A 09/04/2019   Procedure: PRIMARY COLOCOLOSTOMY REVERSAL AND TAKEDOWN OF HARTMAN;  Surgeon: [name], MD;  Location: [facility];  Service: General;  Laterality: N/A;   EYE SURGERY     bilateral cataracts with lens implants   LAPAROSCOPIC PARTIAL COLECTOMY N/A 03/22/2019   Procedure: LAPAROSCOPY, SPLENIC FLEXURE MOBILIZATION WITH OPEN HARTMANN PROCEDURE;  Surgeon: [name], MD;  Location[facility];  Service: General;  Laterality: N/A;   LAPAROTOMY N/A 07/22/2020   Procedure: EXPLORATORY LAPAROTOMY lysis of adhesions;  Surgeon: Coralie Keens, MD;  Location: [facility];  Service: General;  Laterality: N/A;     OB History   No obstetric history on file.     Family History  Problem Relation Age of Onset   Colon cancer Sister    Breast cancer Sister    Heart disease Father    Hip fracture Mother  Hypertension Other     Social History   Tobacco Use   Smoking status: Former    Packs/day: 1.00    Years: 30.00    Pack years: 30.00    Types: Cigarettes    Quit date: 07/14/2011    Years since quitting: 9.8    Passive exposure: Never   Smokeless tobacco: Never   Tobacco comments:    QUIT 2013   Vaping Use   Vaping Use: Never used  Substance Use Topics   Alcohol use: Yes    Comment: wine occasionally    Drug use: No    Home Medications Prior to Admission medications   Medication Sig Start Date End Date Taking? Authorizing Provider  amoxicillin-clavulanate (AUGMENTIN) 875-125 MG tablet Take 1 tablet by mouth 2 (two) times daily for 10 days. 05/25/21 06/04/21 Yes Fransico Meadow, PA-C  acetaminophen (TYLENOL) 650 MG CR tablet Take 1,300 mg by mouth every 8 (eight) hours as needed for pain. Patient not taking: Reported on 07/20/2020    [provider]  docusate sodium (COLACE) 100 MG capsule Take 1 capsule (100 mg total) by mouth 2 (two) times daily. 08/04/20   Lavina Hamman, MD  feeding supplement (ENSURE ENLIVE / ENSURE PLUS) LIQD Take 237 mLs by mouth 3 (three) times daily between meals. 08/04/20   Lavina Hamman, MD  guaiFENesin (MUCINEX) 600 MG 12 hr tablet Take 1 tablet (600 mg total) by mouth 2 (two) times daily. 08/04/20   Lavina Hamman, MD  methocarbamol (ROBAXIN) 500 MG tablet Take 1 tablet (500 mg total) by mouth every 8 (eight) hours as needed for muscle spasms. 08/04/20   Lavina Hamman, MD  ondansetron (ZOFRAN) 4 MG tablet Take 1 tablet (4 mg total) by mouth every 6 (six) hours as needed for nausea or vomiting. Patient not taking: Reported on 07/20/2020 07/17/20 07/17/21  [name], MD  pantoprazole (PROTONIX) 40 MG tablet Take 1 tablet (40 mg total) by mouth 2 (two) times daily. 08/04/20 09/04/20  Lavina Hamman, MD  polyethylene glycol (MIRALAX / GLYCOLAX) 17 g packet Take 17 g by mouth 2 (two) times daily. 08/04/20   Lavina Hamman, MD  traMADol (ULTRAM) 50 MG tablet Take 1 tablet (50 mg total) by mouth every 6 (six) hours as needed for moderate pain or severe pain. 08/17/20   Biagio Borg, MD    Allergies    Patient has no known allergies.  Review of Systems   Review of Systems  Gastrointestinal:  Positive for nausea.  Neurological:  Positive for headaches.  All other systems reviewed and are negative.  Physical Exam Updated Vital Signs BP (!) 119/58   Pulse  (!) 57   Temp 97.6 F (36.4 C)   Resp 15   SpO2 100%   Physical Exam Vitals and nursing note reviewed.  Constitutional:      Appearance: She is well-developed.  HENT:     Head: Normocephalic.     Comments: Swollen tender area right face,  Eyes:     Extraocular Movements: Extraocular movements intact.     Pupils: Pupils are equal, round, and reactive to light.  Cardiovascular:     Rate and Rhythm: Normal rate.  Pulmonary:     Effort: Pulmonary effort is normal.  Abdominal:     General: There is no distension.     Palpations: Abdomen is soft.  Musculoskeletal:        General: Normal range of motion.  Cervical back: Normal range of motion.  Neurological:     Mental Status: She is alert and oriented to person, place, and time.     Cranial Nerves: No cranial nerve deficit.  Psychiatric:        Mood and Affect: Mood normal.    ED Results / Procedures / Treatments   Labs (all labs ordered are listed, but only abnormal results are displayed) Labs Reviewed  CBC WITH DIFFERENTIAL/PLATELET - Abnormal; Notable for the following components:      Result Value   RDW 16.8 (*)    All other components within normal limits  COMPREHENSIVE METABOLIC PANEL    EKG None  Radiology CT Head Wo Contrast  Result Date: 05/25/2021 CLINICAL DATA:  Headache EXAM: CT HEAD WITHOUT CONTRAST TECHNIQUE: Contiguous axial images were obtained from the base of the skull through the vertex without intravenous contrast. COMPARISON:  CT head dated April 18, 2018 FINDINGS: Brain: No evidence of acute infarction, hemorrhage, hydrocephalus, extra-axial collection or mass lesion/mass effect. Prominence of the ventricles and sulci secondary to moderate cerebral volume loss. Multiple scattered areas of low attenuation in the periventricular and subcortical white matter presumed advanced chronic microvascular ischemic changes. Vascular: No hyperdense vessel or unexpected calcification. Skull: Normal. Negative  for fracture or focal lesion. Sinuses/Orbits: Mucosal thickening of ethmoid air cells. The remaining paranasal sinuses are clear Other: None. IMPRESSION: 1.  No acute intracranial abnormality. 2. Advanced cerebral atrophy and chronic microvascular ischemic changes of the white matter. Electronically Signed   By: Keane Police D.O.   On: 05/25/2021 16:43   CT Maxillofacial W Contrast  Result Date: 05/25/2021 CLINICAL DATA:  Maxillary/facial abscess EXAM: CT MAXILLOFACIAL WITH CONTRAST TECHNIQUE: Multidetector CT imaging of the maxillofacial structures was performed with intravenous contrast. Multiplanar CT image reconstructions were also generated. CONTRAST:  20mL OMNIPAQUE IOHEXOL 300 MG/ML  SOLN COMPARISON:  None. FINDINGS: Osseous: No fracture or mandibular dislocation. No destructive process. Orbits: Negative. No traumatic or inflammatory finding. Sinuses: Right maxillary sinus retention cyst. Otherwise, visualized sinuses are clear. Soft tissues: Asymmetric enlargement of the right parotid gland with mild surrounding edema extending inferiorly into the upper neck along a thickened platysma. No discrete, drainable fluid collection. No ductal dilation or calculus identified. Limited intracranial: No significant or unexpected finding. IMPRESSION: Asymmetric enlargement of the right parotid gland with mild surrounding edema extending inferiorly into the upper neck, suggestive of parotiditis. No ductal dilation or calculus identified. No discrete, drainable fluid collection. Electronically Signed   By: Margaretha Sheffield M.D.   On: 05/25/2021 19:47    Procedures Procedures   Medications Ordered in ED Medications  amoxicillin-clavulanate (AUGMENTIN) 875-125 MG per tablet 1 tablet (1 tablet Oral Not Given 05/25/21 2043)  iohexol (OMNIPAQUE) 300 MG/ML solution 100 mL (75 mLs Intravenous Contrast Given 05/25/21 1844)  amoxicillin-clavulanate (AUGMENTIN) 875-125 MG per tablet 1 tablet (1 tablet Oral Given  05/25/21 2044)    ED Course  I have reviewed the triage vital signs and the nursing notes.  Pertinent labs & imaging results that were available during my care of the patient were reviewed by me and considered in my medical decision making (see chart for details).    MDM Rules/Calculators/A&P                           MDM:  Pt given Augmentin here.  Rx for Augmentin Pt advised to follow up with primary MD for recheck  Final Clinical Impression(s) / ED  Diagnoses Final diagnoses:  Parotiditis    Rx / DC Orders ED Discharge Orders          Ordered    amoxicillin-clavulanate (AUGMENTIN) 875-125 MG tablet  2 times daily        05/25/21 2020          An After Visit Summary was printed and given to the patient.    Sidney Ace 05/25/21 2155    Regan Lemming, MD 05/26/21 [phone]

## 2021-05-26 IMAGING — CT CT ABDOMEN AND PELVIS WITH CONTRAST
2 of 5 series · 16 of 46 positions shown, 18 images · IV contrast (APPLIED)
Comparison: 12/28/2018

CLINICAL DATA: 86-year-old female with acute abdominal and pelvic
pain.

EXAM:
CT ABDOMEN AND PELVIS WITH CONTRAST
TECHNIQUE: Multidetector CT imaging of the abdomen and pelvis was performed
using the standard protocol following bolus administration of
intravenous contrast.
CONTRAST:  100mL OMNIPAQUE IOHEXOL 300 MG/ML  SOLN

[Series 3: abdomen 5.0 · axial · 0.75mm/px · z∈[+847,+1197]mm · 13 of 82 slices shown, 15 images]
[im 6/82  soft-tissue]
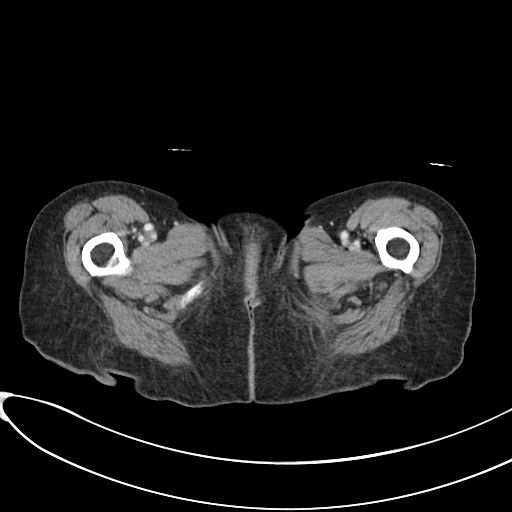
[im 6/82  bone]
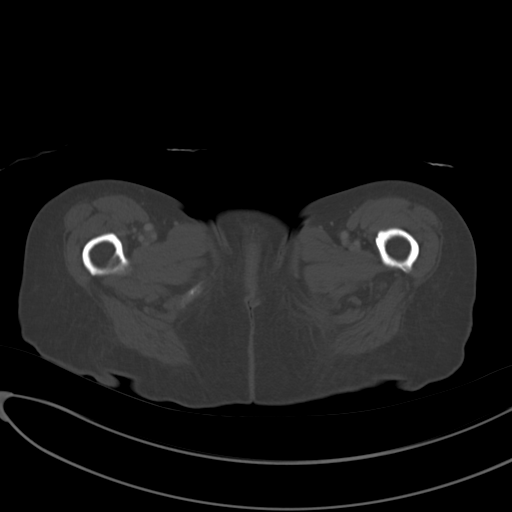
[im 11/82  soft-tissue]
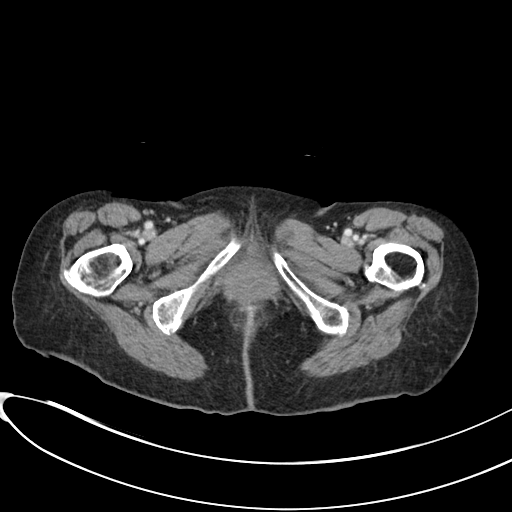
[im 17/82  soft-tissue]
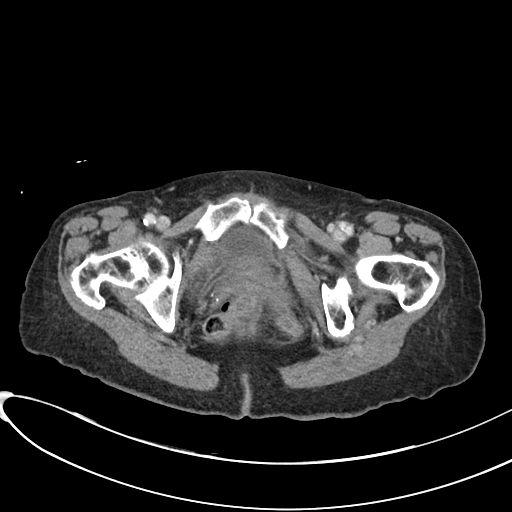
[im 22/82  soft-tissue]
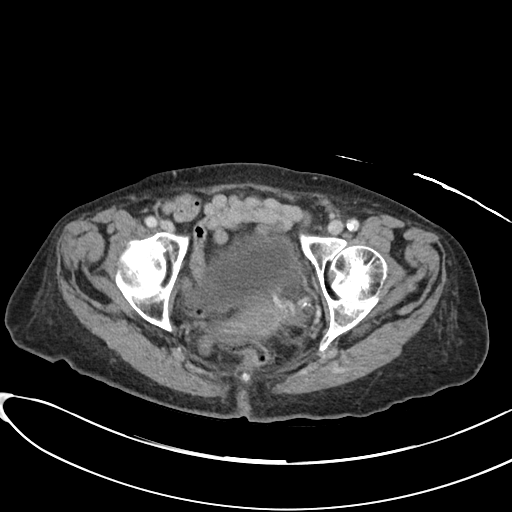
[im 28/82  soft-tissue]
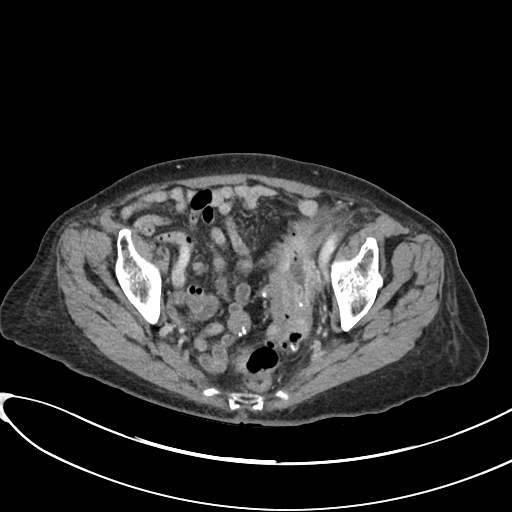
[im 33/82  soft-tissue]
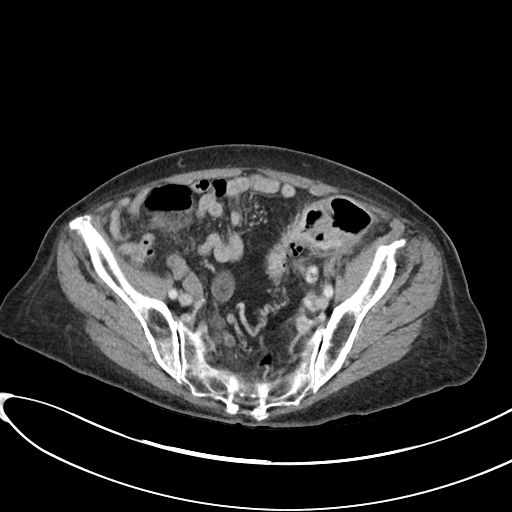
[im 44/82  soft-tissue]
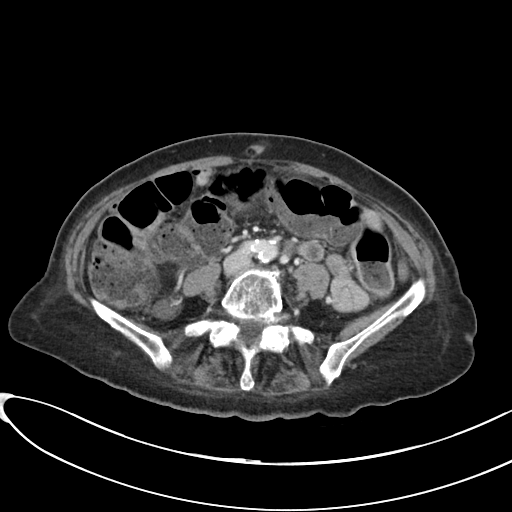
[im 49/82  soft-tissue]
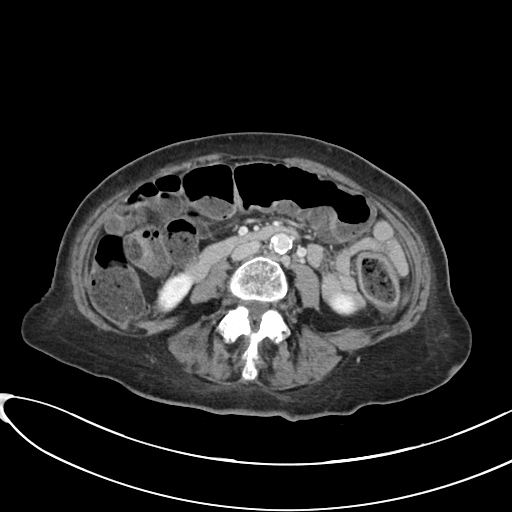
[im 55/82  soft-tissue]
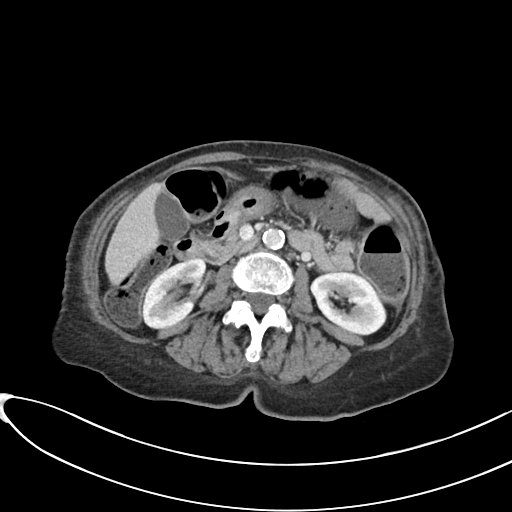
[im 55/82  bone]
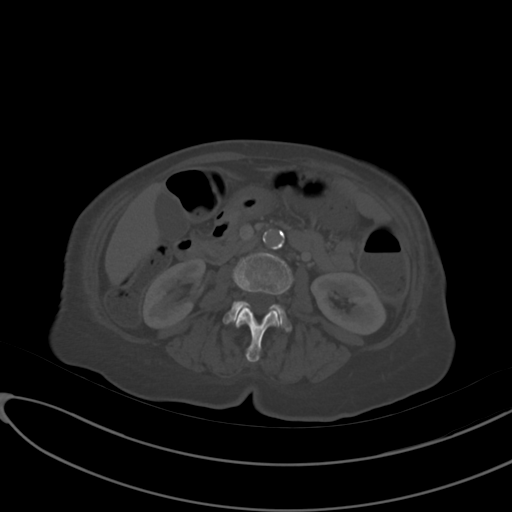
[im 60/82  soft-tissue]
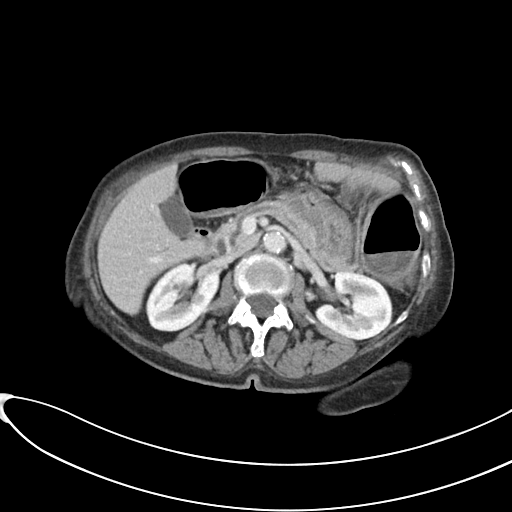
[im 65/82  soft-tissue]
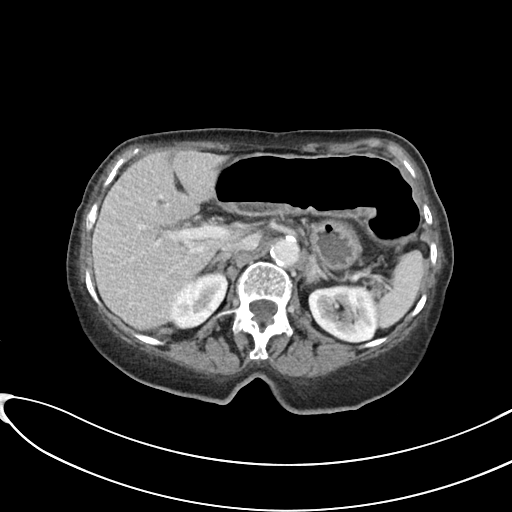
[im 71/82  soft-tissue]
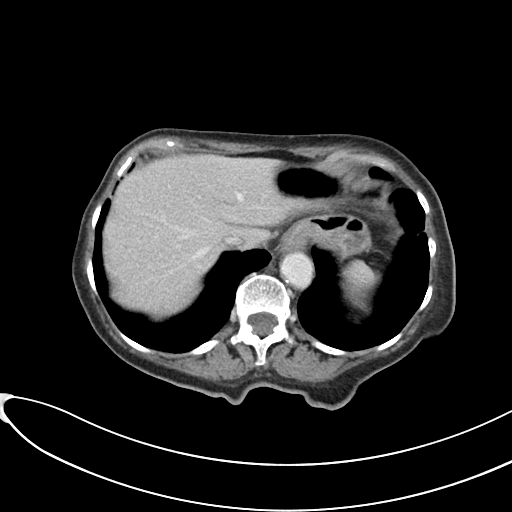
[im 76/82  soft-tissue]
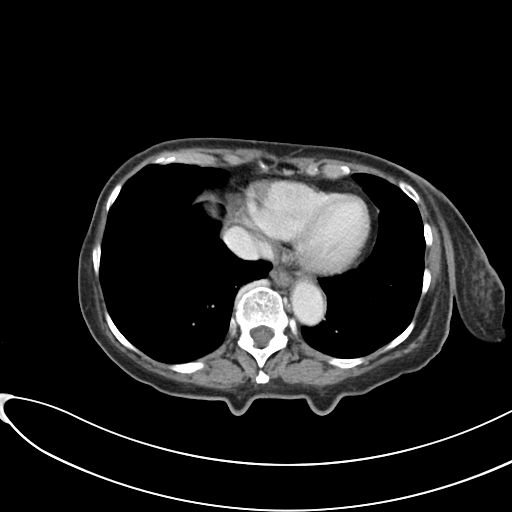

[Series 6: abdomen 3.0 mpr cor · coronal · 0.70mm/px · 3 of 76 slices shown]
[im 26/76  soft-tissue]
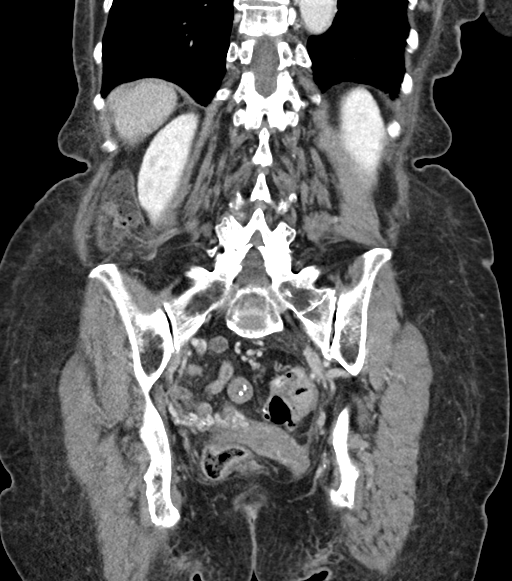
[im 34/76  soft-tissue]
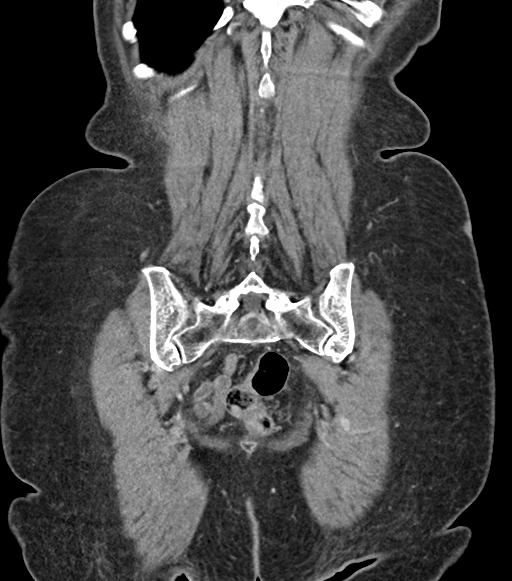
[im 42/76  soft-tissue]
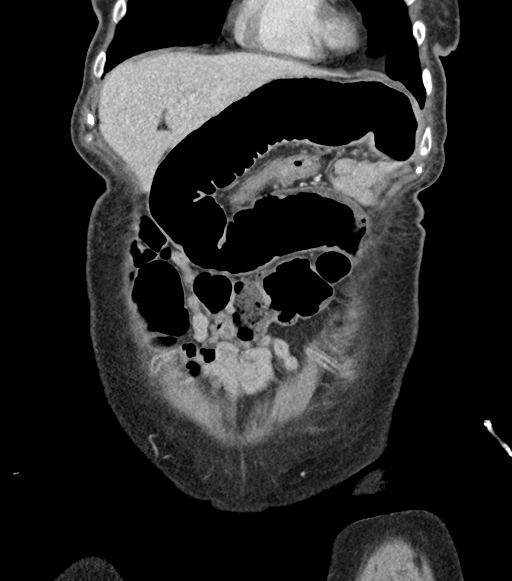

[16 of 46 positions shown; findings below may reference images not displayed]

FINDINGS: Lower chest: No acute abnormality.

Hepatobiliary: The liver and gallbladder are unremarkable except for
small hepatic cysts. No biliary dilatation.

Pancreas: Unremarkable

Spleen: Unremarkable

Adrenals/Urinary Tract: The kidneys, adrenal glands and bladder are
unremarkable except for stable small probable LEFT renal cyst.

Stomach/Bowel: Wall thickening of the proximal sigmoid colon is
noted and appears slightly increased from 12/28/2018 and may
represent increasing diverticulitis. A 3.5 cm intramural collection
along the LEFT sigmoid colonic wall is again noted. Mild colonic
distension proximal to the sigmoid colon is noted with fluid and
gas. There is no evidence of small bowel obstruction.

Vascular/Lymphatic: Aortic atherosclerosis. No enlarged abdominal or
pelvic lymph nodes.

Reproductive: Uterus and bilateral adnexa are unremarkable.

Other: No ascites, pneumoperitoneum or new collection.

Musculoskeletal: No acute or suspicious bony abnormalities.
IMPRESSION: 1. Apparent increase in proximal sigmoid colon wall thickening which
may represent flare up of acute diverticulitis at this area.
Unchanged 3.5 cm intramural sigmoid colonic wall abscess/collection,
which has been present since 11/02/2018. Relative distension of the
colon proximal to the sigmoid colon which may represent a low grade
obstruction. No evidence of pneumoperitoneum.
2.  Aortic Atherosclerosis ([id number]).

## 2021-06-02 IMAGING — DX DG ABDOMEN 1V
2 series · 2 of 2 positions shown · non-contrast
Comparison: Chest and two views abdomen 03/15/2019. CT abdomen and
pelvis 03/11/2019.

CLINICAL DATA: History of sigmoid diverticulitis with associated
small abscess by CT scan 03/11/2019. Possible colon stricture.

EXAM:
ABDOMEN - 1 VIEW

[abdomen kub (1 of 2)]
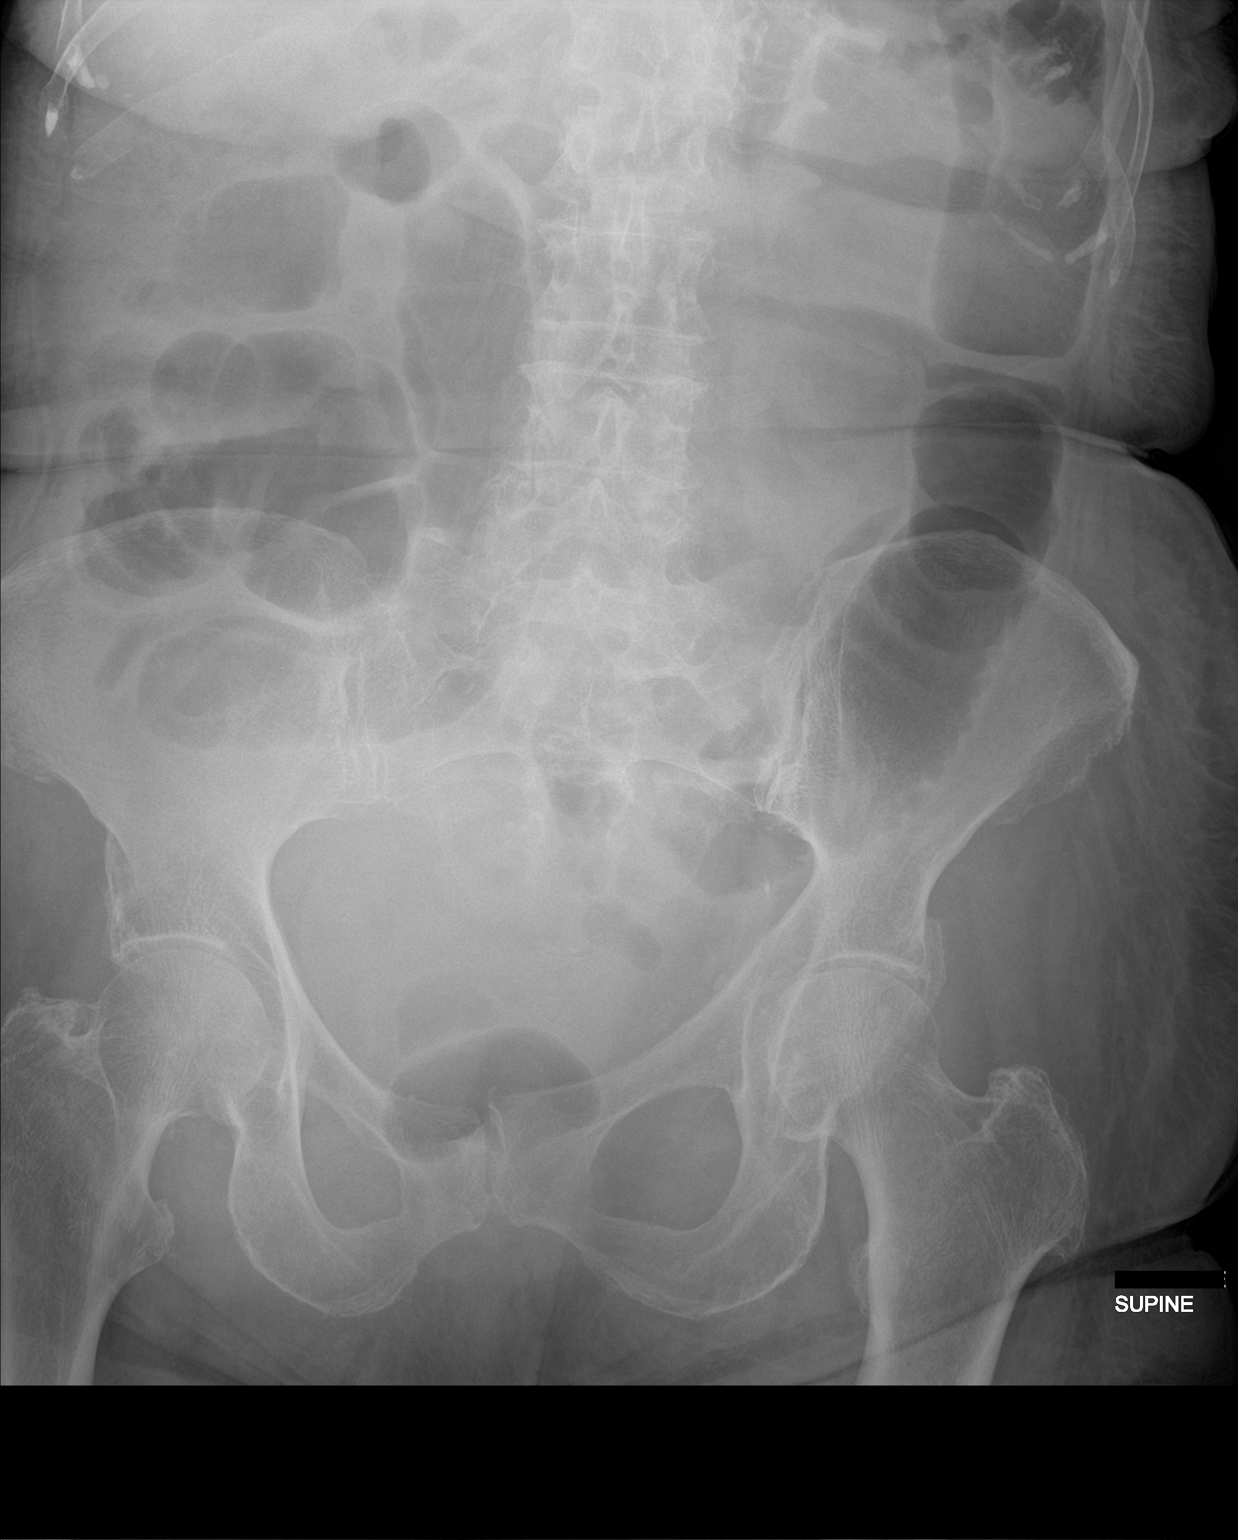

[abdomen kub (2 of 2)]
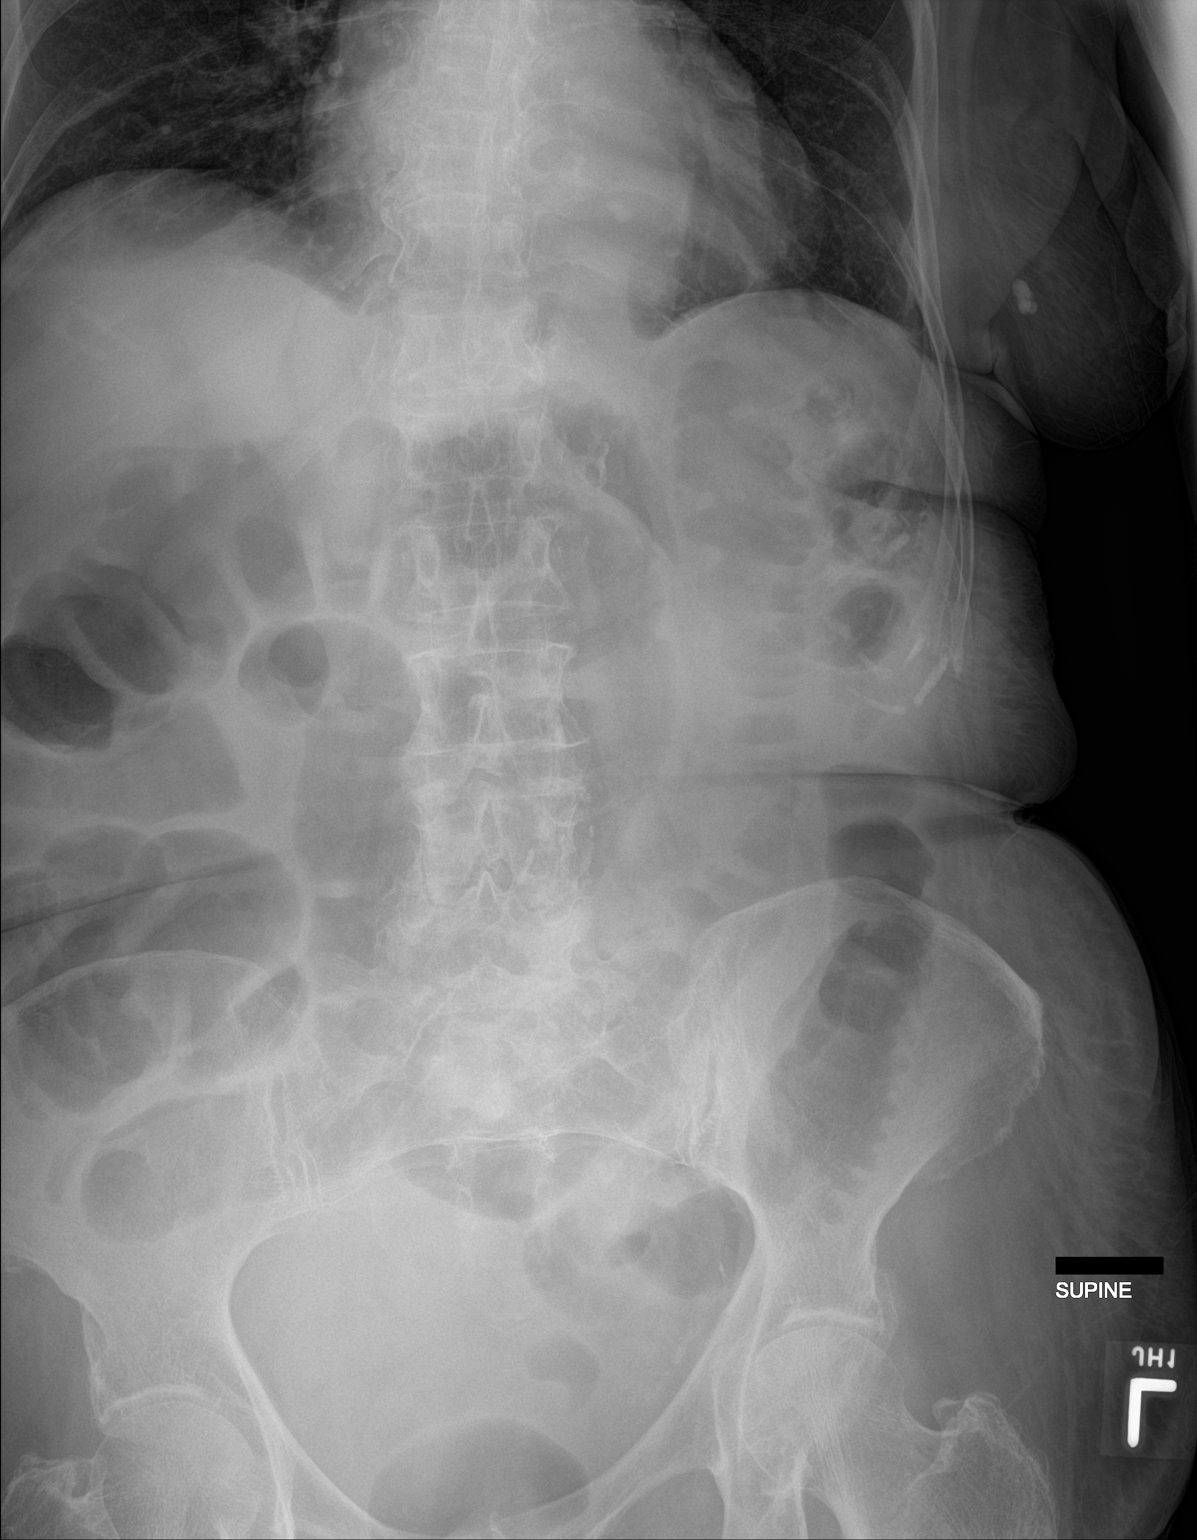

[2 of 2 positions shown; findings below may reference images not displayed]

FINDINGS: No evidence of free intraperitoneal air is seen on supine films.
There is mild gaseous distention of the colon and scattered small
bowel loops, improved since the comparison plain films. There is
some gas in the rectum. No abnormal abdominal calcification. No
acute bony abnormality.
IMPRESSION: Mild gaseous distention of the colon appears improved compared to
the most recent exam.

## 2021-06-10 ENCOUNTER — Ambulatory Visit: Payer: BC Managed Care – PPO | Admitting: Internal Medicine

## 2021-06-11 IMAGING — DX DG ABD PORTABLE 1V
1 series · 1 of 1 positions shown · non-contrast
Comparison: Abdominal x-ray dated March 21, 2019.

CLINICAL DATA: Nausea and vomiting.

EXAM:
PORTABLE ABDOMEN - 1 VIEW

[abdomen kub]
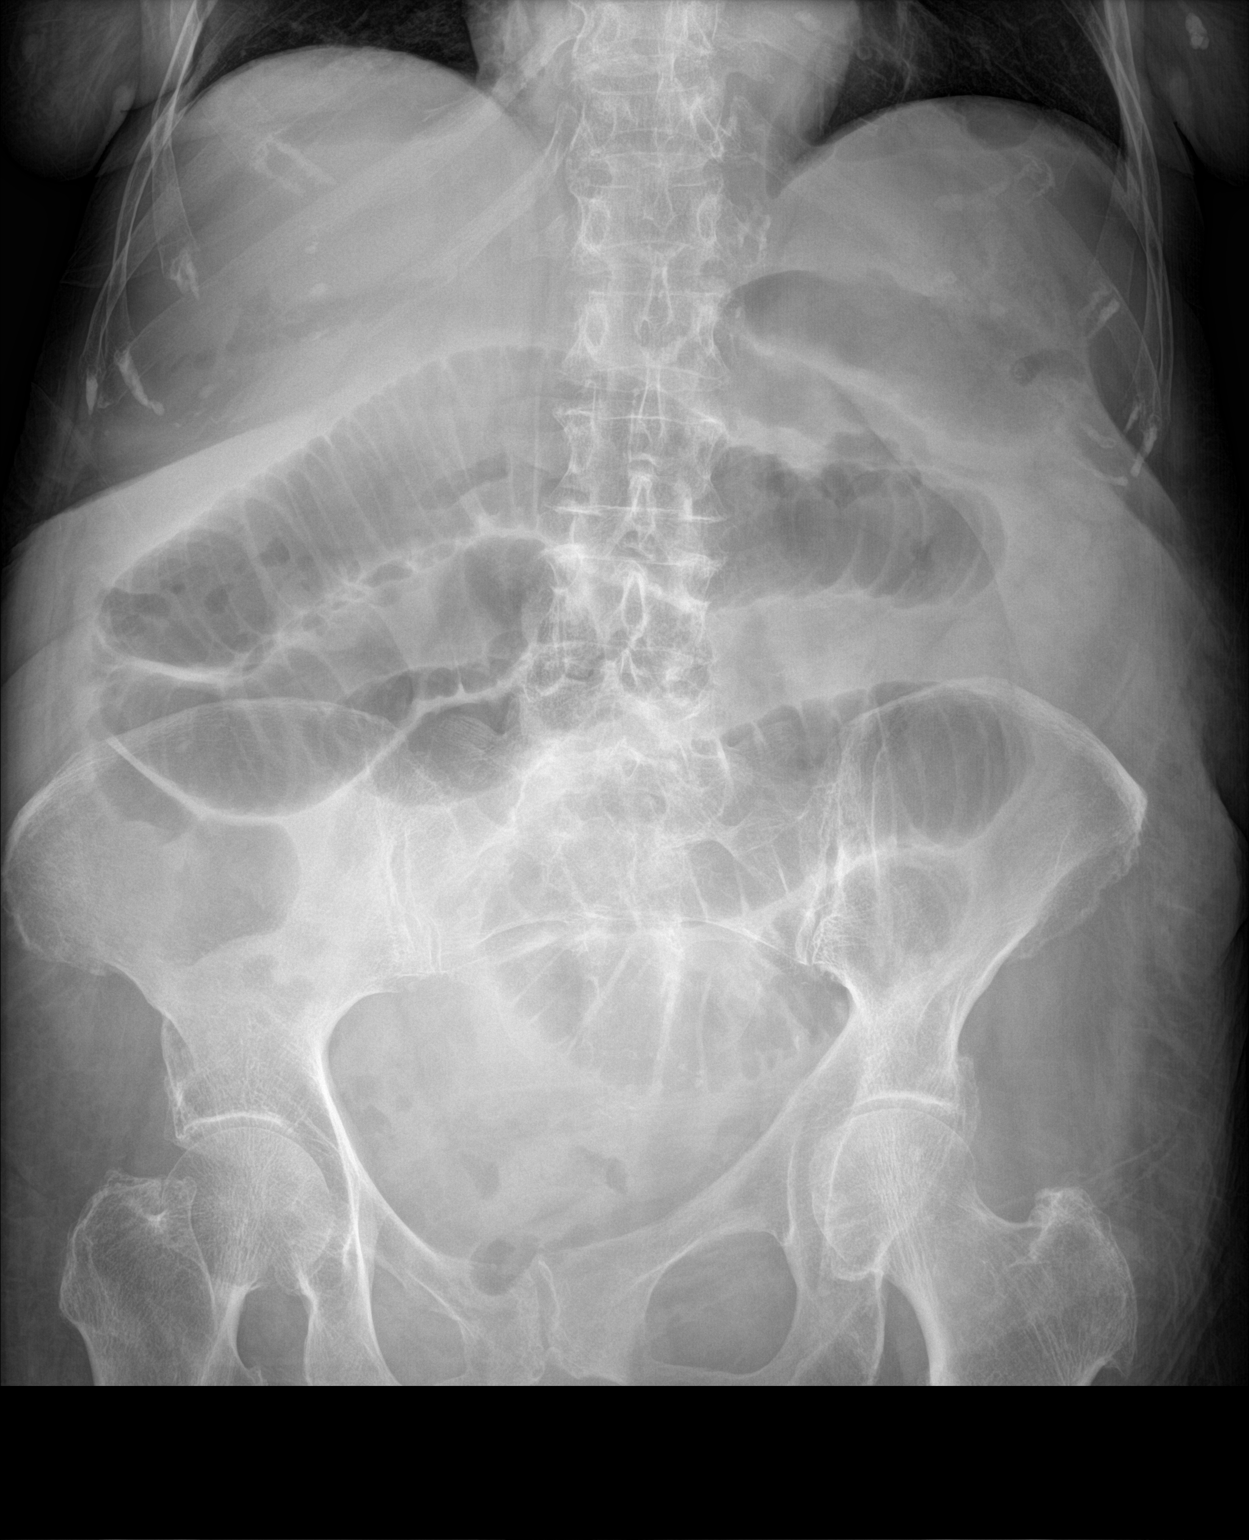

[1 of 1 positions shown; findings below may reference images not displayed]

FINDINGS: Stable to slightly worsened dilatation of air-filled small bowel.
There is a small amount of air in the rectosigmoid colon, but the
colon appears largely decompressed. No acute osseous abnormality.
IMPRESSION: 1. Stable to slightly worsened small bowel ileus versus partial
obstruction.

## 2021-06-12 ENCOUNTER — Other Ambulatory Visit: Payer: Self-pay

## 2021-06-12 ENCOUNTER — Ambulatory Visit (INDEPENDENT_AMBULATORY_CARE_PROVIDER_SITE_OTHER): Payer: Medicare Other | Admitting: Internal Medicine

## 2021-06-12 ENCOUNTER — Encounter: Payer: Self-pay | Admitting: Internal Medicine

## 2021-06-12 VITALS — BP 142/70 | HR 68 | Temp 98.5°F | Ht 60.0 in | Wt 139.0 lb

## 2021-06-12 DIAGNOSIS — J069 Acute upper respiratory infection, unspecified: Secondary | ICD-10-CM

## 2021-06-12 DIAGNOSIS — J449 Chronic obstructive pulmonary disease, unspecified: Secondary | ICD-10-CM

## 2021-06-12 DIAGNOSIS — R739 Hyperglycemia, unspecified: Secondary | ICD-10-CM | POA: Diagnosis not present

## 2021-06-12 DIAGNOSIS — I10 Essential (primary) hypertension: Secondary | ICD-10-CM

## 2021-06-12 MED ORDER — AZITHROMYCIN 250 MG PO TABS: ORAL_TABLET | ORAL | 1 refills | Status: AC

## 2021-06-12 NOTE — Progress Notes (Signed)
Patient ID: WAYNETTE TOWERS, female   DOB: 31-Dec-1931, 87 y.o.   MRN: [id number]        Chief Complaint: follow up recent uri, copd, hyperglycemia       HPI:  KAMERIA CANIZARES is a 87 y.o. female seen in UC recently;   Here with 6 days acute onset fever, facial pain, pressure, headache, general weakness and malaise, and greenish d/c, with mild ST and cough, but pt denies chest pain, wheezing, increased sob or doe, orthopnea, PND, increased LE swelling, palpitations, dizziness or syncope.  Pt was seen and tx with capsule antibiotix and pt adamant she cannot take capsules.   Pt denies polydipsia, polyuria, or new focal neuro s/s.   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[place])    Past Surgical History:  Procedure Laterality Date   COLONOSCOPY     COLOSTOMY REVERSAL N/A 09/04/2019   Procedure: PRIMARY COLOCOLOSTOMY REVERSAL AND TAKEDOWN OF HARTMAN;  Surgeon: [name], MD;  Location: [facility];  Service: General;  Laterality: N/A;   EYE SURGERY     bilateral cataracts with lens implants   LAPAROSCOPIC PARTIAL COLECTOMY N/A 03/22/2019   Procedure: LAPAROSCOPY, SPLENIC FLEXURE MOBILIZATION WITH OPEN HARTMANN PROCEDURE;  Surgeon: [name], MD;  Location: [facility];  Service: General;  Laterality: N/A;   LAPAROTOMY N/A 07/22/2020   Procedure: EXPLORATORY LAPAROTOMY lysis of adhesions;  Surgeon: Coralie Keens, MD;  Location: [facility];   Service: General;  Laterality: N/A;    reports that she quit smoking about 9 years ago. Her smoking use included cigarettes. She has a 30.00 pack-year smoking history. She has never been exposed to tobacco smoke. She has never used smokeless tobacco. She reports current alcohol use. She reports that she does not use drugs. family history includes Breast cancer in her sister; Colon cancer in her sister; Heart disease in her father; Hip fracture in her mother; Hypertension in an other family member. No Known Allergies Current Outpatient Medications on File Prior to Visit  Medication Sig Dispense Refill   acetaminophen (TYLENOL) 650 MG CR tablet Take 1,300 mg by mouth every 8 (eight) hours as needed for pain. (Patient not taking: Reported on 07/20/2020)     docusate sodium (COLACE) 100 MG capsule Take 1 capsule (100 mg total) by mouth 2 (two) times daily. (Patient not taking: Reported on 06/12/2021) 10 capsule 0   feeding supplement (ENSURE ENLIVE / ENSURE PLUS) LIQD Take 237 mLs by mouth 3 (three) times daily between meals. (Patient not taking: Reported on 06/12/2021) 10000 mL 0   guaiFENesin (MUCINEX) 600 MG 12 hr tablet Take 1 tablet (600 mg total) by mouth 2 (two) times daily. (Patient not taking: Reported on 06/12/2021) 30 tablet 0   methocarbamol (ROBAXIN) 500 MG tablet Take 1 tablet (500 mg total) by mouth every 8 (eight) hours as needed for muscle  spasms. (Patient not taking: Reported on 06/12/2021) 30 tablet 0   ondansetron (ZOFRAN) 4 MG tablet Take 1 tablet (4 mg total) by mouth every 6 (six) hours as needed for nausea or vomiting. (Patient not taking: Reported on 07/20/2020) 30 tablet 1   pantoprazole (PROTONIX) 40 MG tablet Take 1 tablet (40 mg total) by mouth 2 (two) times daily. 60 tablet 0   polyethylene glycol (MIRALAX / GLYCOLAX) 17 g packet Take 17 g by mouth 2 (two) times daily. (Patient not taking: Reported on 06/12/2021) 14 each 0   traMADol (ULTRAM) 50 MG tablet Take 1 tablet (50 mg total)  by mouth every 6 (six) hours as needed for moderate pain or severe pain. (Patient not taking: Reported on 06/12/2021) 30 tablet 0   No current facility-administered medications on file prior to visit.        ROS:  All others reviewed and negative.  Objective        PE:  BP (!) 142/70 (BP Location: Left Arm, Patient Position: Sitting, Cuff Size: Normal)   Pulse 68   Temp 98.5 F (36.9 C) (Oral)   Ht 5' (1.524 m)   Wt 139 lb (63 kg)   SpO2 98%   BMI 27.15 kg/m                 Constitutional: Pt appears in NAD               HENT: Head: NCAT.                Right Ear: External ear normal.                 Left Ear: External ear normal. Bilat tm's with mild erythema.  Max sinus areas mild tender.  Pharynx with mild erythema, no exudate               Eyes: . Pupils are equal, round, and reactive to light. Conjunctivae and EOM are normal               Nose: without d/c or deformity               Neck: Neck supple. Gross normal ROM               Cardiovascular: Normal rate and regular rhythm.                 Pulmonary/Chest: Effort normal and breath sounds without rales or wheezing.                Abd:  Soft, NT, ND, + BS, no organomegaly               Neurological: Pt is alert. At baseline orientation, motor grossly intact               Skin: Skin is warm. No rashes, no other new lesions, LE edema - none               Psychiatric: Pt behavior is normal without agitation   Micro: none  Cardiac tracings I have personally interpreted today:  none  Pertinent Radiological findings (summarize): none   Lab Results  Component Value Date   WBC 8.7 05/25/2021   HGB 12.2 05/25/2021   HCT 39.1 05/25/2021   PLT 169 05/25/2021   GLUCOSE 85 05/25/2021   CHOL 209 (H) 11/24/2017   TRIG 98 07/29/2020   HDL 76.30 11/24/2017   LDLDIRECT 142.7 11/10/2011   LDLCALC 118 (  H) 11/24/2017   ALT 6 05/25/2021   AST 21 05/25/2021   NA 139 05/25/2021   K 3.9 05/25/2021   CL 104 05/25/2021   CREATININE  0.77 05/25/2021   BUN 13 05/25/2021   CO2 27 05/25/2021   TSH 0.75 11/24/2017   INR 0.97 04/18/2018   HGBA1C 5.8 (H) 09/01/2019   Assessment/Plan:  JULLIA MULLIGAN is a 87 y.o. Black or African American [2] female with  has a past medical history of CERVICAL RADICULOPATHY, LEFT (09/03/2010), Cervical spondylosis (11/10/2011), COPD (chronic obstructive pulmonary disease) ([place]) (11/10/2011), Diverticulitis, HTN (hypertension) (06/13/2021), HYPERLIPIDEMIA (09/03/2010), and Sigmoid stricture ([place]).  URI (upper respiratory infection) Mild to mod, for antibx course in tablet form,  to f/u any worsening symptoms or concerns  COPD (chronic obstructive pulmonary disease) ([facility]) O/w stable, cont current med tx including albuterol hfa prn  Hyperglycemia Lab Results  Component Value Date   HGBA1C 5.8 (H) 09/01/2019   Stable, pt to continue current medical treatment  - diet   HTN (hypertension) Mild uncontrolled, pt decliens further med tx at this time,  to f/u any worsening symptoms or concerns  BP Readings from Last 3 Encounters:  06/12/21 (!) 142/70  05/25/21 (!) 119/58  05/25/21 (!) 144/68    Followup: Return in about 6 months (around 12/11/2021).  Cathlean Cower, MD 06/13/2021 5:15 AM [facility]

## 2021-06-12 NOTE — Patient Instructions (Signed)
Please take all new medication as prescribed - the antibiotic  Please continue all other medications as before, and refills have been done if requested.  Please have the pharmacy call with any other refills you may need.  Please continue your efforts at being more active, low cholesterol diet, and weight control.  Please keep your appointments with your specialists as you may have planned  Please make an Appointment to return in 6 months, or sooner if needed

## 2021-06-13 ENCOUNTER — Encounter: Payer: Self-pay | Admitting: Internal Medicine

## 2021-06-13 DIAGNOSIS — J069 Acute upper respiratory infection, unspecified: Secondary | ICD-10-CM | POA: Insufficient documentation

## 2021-06-13 DIAGNOSIS — I10 Essential (primary) hypertension: Secondary | ICD-10-CM

## 2021-06-13 HISTORY — DX: Essential (primary) hypertension: I10

## 2021-06-13 NOTE — Assessment & Plan Note (Signed)
Lab Results  Component Value Date   HGBA1C 5.8 (H) 09/01/2019   Stable, pt to continue current medical treatment  - diet

## 2021-06-13 NOTE — Assessment & Plan Note (Signed)
Mild to mod, for antibx course in tablet form,  to f/u any worsening symptoms or concerns

## 2021-06-13 NOTE — Assessment & Plan Note (Signed)
Mild uncontrolled, pt decliens further med tx at this time,  to f/u any worsening symptoms or concerns  BP Readings from Last 3 Encounters:  06/12/21 (!) 142/70  05/25/21 (!) 119/58  05/25/21 (!) 144/68

## 2021-06-13 NOTE — Assessment & Plan Note (Signed)
O/w stable, cont current med tx including albuterol hfa prn

## 2021-06-20 ENCOUNTER — Telehealth: Payer: Self-pay | Admitting: Internal Medicine

## 2021-06-20 MED ORDER — DOXYCYCLINE HYCLATE 100 MG PO TABS: 100.0000 mg | ORAL_TABLET | Freq: Two times a day (BID) | ORAL | 0 refills | Status: DC

## 2021-06-20 NOTE — Telephone Encounter (Signed)
Patient calling in  Still experiencing the swelling on side of face from previous OV 12.01  Patient wants to know if provider can submit another antibiotic to pharmacy to see if this will help before coming in for another OV  Please call patient [phone]

## 2021-06-20 NOTE — Telephone Encounter (Signed)
Patient notified that prescription sent to pharmacy

## 2021-06-20 NOTE — Telephone Encounter (Signed)
Ok I sent doxycycline antibiotic

## 2021-06-23 MED ORDER — CEPHALEXIN 500 MG PO CAPS: 500.0000 mg | ORAL_CAPSULE | Freq: Three times a day (TID) | ORAL | 0 refills | Status: DC

## 2021-06-23 NOTE — Telephone Encounter (Signed)
Ok to stop the doxycycline  I added doxy to her med "allergy" list  Ok for cephalexin asd - done erx

## 2021-06-23 NOTE — Addendum Note (Signed)
Addended by: Biagio Borg on: 06/23/2021 12:29 PM   Modules accepted: Orders

## 2021-06-23 NOTE — Telephone Encounter (Signed)
Caller connected to [facility] 12.10.2022.    Caller states that she has been sick all day after taking medication that she was prescribed. She has a swelling on the right side of her face that they said was infection oriented, which is why she received the medication. The medication is causing her to vomit.

## 2021-06-23 NOTE — Telephone Encounter (Signed)
Called and LDVM on pts with Dr.John's instructions.

## 2021-12-11 ENCOUNTER — Ambulatory Visit: Payer: BC Managed Care – PPO | Admitting: Internal Medicine

## 2022-04-07 ENCOUNTER — Encounter (INDEPENDENT_AMBULATORY_CARE_PROVIDER_SITE_OTHER): Payer: Medicare Other | Admitting: Ophthalmology

## 2022-04-14 ENCOUNTER — Encounter (INDEPENDENT_AMBULATORY_CARE_PROVIDER_SITE_OTHER): Payer: Medicare Other | Admitting: Ophthalmology

## 2022-07-17 ENCOUNTER — Other Ambulatory Visit: Payer: Self-pay

## 2022-07-17 ENCOUNTER — Encounter (HOSPITAL_BASED_OUTPATIENT_CLINIC_OR_DEPARTMENT_OTHER): Payer: Self-pay

## 2022-07-17 ENCOUNTER — Emergency Department (HOSPITAL_BASED_OUTPATIENT_CLINIC_OR_DEPARTMENT_OTHER)
Admission: EM | Admit: 2022-07-17 | Discharge: 2022-07-17 | Disposition: A | Discharge status: Home / Self Care | Payer: Medicare Other | Attending: Emergency Medicine | Admitting: Emergency Medicine

## 2022-07-17 DIAGNOSIS — K1121 Acute sialoadenitis: Secondary | ICD-10-CM | POA: Diagnosis not present

## 2022-07-17 DIAGNOSIS — R220 Localized swelling, mass and lump, head: Secondary | ICD-10-CM | POA: Diagnosis present

## 2022-07-17 MED ORDER — AMOXICILLIN-POT CLAVULANATE 875-125 MG PO TABS: 1.0000 | ORAL_TABLET | Freq: Two times a day (BID) | ORAL | 0 refills | Status: DC

## 2022-07-17 NOTE — Discharge Instructions (Addendum)
Please take the antibiotic as prescribed. If you develop fever, chills, face turns red, worsening swelling please return to the ED. Please follow-up with your PCP and dentist.

## 2022-07-17 NOTE — ED Provider Notes (Signed)
[facility] EMERGENCY DEPT Provider Note   CSN: [id number] Arrival date & time: 07/17/22  1332     History  Chief Complaint  Patient presents with   Facial Swelling    Theresa Arnold is a 89 y.o. female, hx of parotitis who presents to the ED 2/2 to R facial swelling for the last week. States lower side of R face has been more swollen and tender. States she has had this before and taken antibiotics which resolved the issue. Swelling of the face has gotten worse and states it causes a headache. Denies any fever or chills. No neck pain or redness to face or ear. Denies any out of the country travel.   Complains of fractured upper molar of right side of face for the last 2 days.     Home Medications Prior to Admission medications   Medication Sig Start Date End Date Taking? Authorizing Provider  amoxicillin-clavulanate (AUGMENTIN) 875-125 MG tablet Take 1 tablet by mouth every 12 (twelve) hours. 07/17/22  Yes Liv Rallis L, PA  acetaminophen (TYLENOL) 650 MG CR tablet Take 1,300 mg by mouth every 8 (eight) hours as needed for pain. Patient not taking: Reported on 07/20/2020    [provider]  cephALEXin (KEFLEX) 500 MG capsule Take 1 capsule (500 mg total) by mouth 3 (three) times daily. 06/23/21   Biagio Borg, MD  docusate sodium (COLACE) 100 MG capsule Take 1 capsule (100 mg total) by mouth 2 (two) times daily. Patient not taking: Reported on 06/12/2021 08/04/20   Lavina Hamman, MD  doxycycline (VIBRA-TABS) 100 MG tablet Take 1 tablet (100 mg total) by mouth 2 (two) times daily. 06/20/21   Biagio Borg, MD  feeding supplement (ENSURE ENLIVE / ENSURE PLUS) LIQD Take 237 mLs by mouth 3 (three) times daily between meals. Patient not taking: Reported on 06/12/2021 08/04/20   Lavina Hamman, MD  guaiFENesin (MUCINEX) 600 MG 12 hr tablet Take 1 tablet (600 mg total) by mouth 2 (two) times daily. Patient not taking: Reported on 06/12/2021 08/04/20   Lavina Hamman, MD   methocarbamol (ROBAXIN) 500 MG tablet Take 1 tablet (500 mg total) by mouth every 8 (eight) hours as needed for muscle spasms. Patient not taking: Reported on 06/12/2021 08/04/20   Lavina Hamman, MD  pantoprazole (PROTONIX) 40 MG tablet Take 1 tablet (40 mg total) by mouth 2 (two) times daily. 08/04/20 09/04/20  Lavina Hamman, MD  polyethylene glycol (MIRALAX / GLYCOLAX) 17 g packet Take 17 g by mouth 2 (two) times daily. Patient not taking: Reported on 06/12/2021 08/04/20   Lavina Hamman, MD  traMADol (ULTRAM) 50 MG tablet Take 1 tablet (50 mg total) by mouth every 6 (six) hours as needed for moderate pain or severe pain. Patient not taking: Reported on 06/12/2021 08/17/20   Biagio Borg, MD      Allergies    Doxycycline    Review of Systems   Review of Systems  Constitutional:  Negative for fever.  HENT:  Positive for facial swelling.     Physical Exam Updated Vital Signs BP (!) 167/73 (BP Location: Right Arm)   Pulse 69   Temp (!) 97.5 F (36.4 C)   Resp 18   Ht 5' (1.524 m)   Wt 63 kg   SpO2 100%   BMI 27.15 kg/m  Physical Exam Vitals and nursing note reviewed.  Constitutional:      General: She is not in acute distress.  Appearance: She is well-developed.  HENT:     Head: Normocephalic and atraumatic.     Comments: +enlarged R parotid gland w/o overlying erythema    Mouth/Throat:     Comments: Fractured R upper molar Eyes:     Conjunctiva/sclera: Conjunctivae normal.  Cardiovascular:     Rate and Rhythm: Normal rate and regular rhythm.     Heart sounds: No murmur heard. Pulmonary:     Effort: Pulmonary effort is normal. No respiratory distress.     Breath sounds: Normal breath sounds.  Abdominal:     Palpations: Abdomen is soft.     Tenderness: There is no abdominal tenderness.  Musculoskeletal:        General: No swelling.     Cervical back: Neck supple.  Skin:    General: Skin is warm and dry.     Capillary Refill: Capillary refill takes less than 2  seconds.  Neurological:     Mental Status: She is alert.  Psychiatric:        Mood and Affect: Mood normal.     ED Results / Procedures / Treatments   Labs (all labs ordered are listed, but only abnormal results are displayed) Labs Reviewed - No data to display  EKG None  Radiology No results found.  Procedures Procedures    Medications Ordered in ED Medications - No data to display  ED Course/ Medical Decision Making/ A&P                           Medical Decision Making Patient here for R lower facial swellingx1 week, no overlying erythema. Reports has happened before and been treated with antibiotics with resolution. She does have an engorged parotid gland and fractured tooth. I believe she has parotitis and an additional dental infection. Upper molar is fractured and swelling is along jaw thus I believe they are separate issues. I will prescribe augmentin and I gave her strict return precautions and discussed need for f/u with PCP and dentist. She is able to open her mouth with ease. No trismus. No obvious abscesses.   Final Clinical Impression(s) / ED Diagnoses Final diagnoses:  Parotitis, acute    Rx / DC Orders ED Discharge Orders          Ordered    amoxicillin-clavulanate (AUGMENTIN) 875-125 MG tablet  Every 12 hours        07/17/22 1708              Vona Whiters, [place], [place] 07/17/22 1717    Hayden Rasmussen, MD 07/18/22 1105

## 2022-07-17 NOTE — ED Triage Notes (Addendum)
Reports having swelling to right jaw and last time it was infection in the vein. Reports it was 2 months ago.  Reports it started swelling again approx 1 week ago. Chart review reports she had parotiditis.

## 2022-07-17 NOTE — ED Provider Triage Note (Signed)
Emergency Medicine Provider Triage Evaluation Note  Theresa Arnold , a 89 y.o. female  was evaluated in triage.  Pt complains of swelling of the R side of the face for the last week. Hx of this in past and was given antibiotics and it resolves. Reports headache with this. No fevers.   Review of Systems  Positive: Facial swelling Negative: fevers  Physical Exam  BP (!) 145/65 (BP Location: Right Arm)   Pulse 80   Temp 97.8 F (36.6 C)   Resp 18   Ht 5' (1.524 m)   Wt 63 kg   SpO2 99%   BMI 27.15 kg/m  Gen:   Awake, no distress   Resp:  Normal effort  MSK:   Moves extremities without difficulty  Other:  +R parotid gland swelling, no facial erythema  Medical Decision Making  Medically screening exam initiated at 5:01 PM.  Appropriate orders placed.  Theresa Arnold was informed that the remainder of the evaluation will be completed by another provider, this initial triage assessment does not replace that evaluation, and the importance of remaining in the ED until their evaluation is complete.    Osvaldo Shipper, [place] 07/17/22 1705

## 2022-08-13 ENCOUNTER — Encounter: Payer: Self-pay | Admitting: Internal Medicine

## 2022-08-13 ENCOUNTER — Ambulatory Visit: Payer: Medicare Other | Admitting: Internal Medicine

## 2022-08-13 VITALS — BP 130/72 | HR 58 | Temp 97.8°F | Ht 60.0 in | Wt 137.0 lb

## 2022-08-13 DIAGNOSIS — R739 Hyperglycemia, unspecified: Secondary | ICD-10-CM | POA: Diagnosis not present

## 2022-08-13 DIAGNOSIS — E7800 Pure hypercholesterolemia, unspecified: Secondary | ICD-10-CM | POA: Diagnosis not present

## 2022-08-13 DIAGNOSIS — I10 Essential (primary) hypertension: Secondary | ICD-10-CM | POA: Diagnosis not present

## 2022-08-13 DIAGNOSIS — R221 Localized swelling, mass and lump, neck: Secondary | ICD-10-CM | POA: Diagnosis not present

## 2022-08-13 DIAGNOSIS — E538 Deficiency of other specified B group vitamins: Secondary | ICD-10-CM | POA: Diagnosis not present

## 2022-08-13 DIAGNOSIS — E559 Vitamin D deficiency, unspecified: Secondary | ICD-10-CM

## 2022-08-13 LAB — VITAMIN B12: Vitamin B-12: 327 pg/mL (ref 211–911)

## 2022-08-13 LAB — CBC WITH DIFFERENTIAL/PLATELET
Basophils Absolute: 0 10*3/uL (ref 0.0–0.1)
Basophils Relative: 0.6 % (ref 0.0–3.0)
Eosinophils Absolute: 0.1 10*3/uL (ref 0.0–0.7)
Eosinophils Relative: 1.6 % (ref 0.0–5.0)
HCT: 44.7 % (ref 36.0–46.0)
Hemoglobin: 14.6 g/dL (ref 12.0–15.0)
Lymphocytes Relative: 41 % (ref 12.0–46.0)
Lymphs Abs: 3 10*3/uL (ref 0.7–4.0)
MCHC: 32.7 g/dL (ref 30.0–36.0)
MCV: 96.1 fl (ref 78.0–100.0)
Monocytes Absolute: 0.5 10*3/uL (ref 0.1–1.0)
Monocytes Relative: 7.6 % (ref 3.0–12.0)
Neutro Abs: 3.5 10*3/uL (ref 1.4–7.7)
Neutrophils Relative %: 49.2 % (ref 43.0–77.0)
Platelets: 189 10*3/uL (ref 150.0–400.0)
RBC: 4.65 Mil/uL (ref 3.87–5.11)
RDW: 16.3 % — ABNORMAL HIGH (ref 11.5–15.5)
WBC: 7.2 10*3/uL (ref 4.0–10.5)

## 2022-08-13 LAB — URINALYSIS, ROUTINE W REFLEX MICROSCOPIC
Bilirubin Urine: NEGATIVE
Hgb urine dipstick: NEGATIVE
Ketones, ur: NEGATIVE
Leukocytes,Ua: NEGATIVE
Nitrite: NEGATIVE
RBC / HPF: NONE SEEN (ref 0–?)
Specific Gravity, Urine: 1.02 (ref 1.000–1.030)
Total Protein, Urine: NEGATIVE
Urine Glucose: NEGATIVE
Urobilinogen, UA: 0.2 (ref 0.0–1.0)
pH: 6 (ref 5.0–8.0)

## 2022-08-13 LAB — HEPATIC FUNCTION PANEL
ALT: 7 U/L (ref 0–35)
AST: 22 U/L (ref 0–37)
Albumin: 4.5 g/dL (ref 3.5–5.2)
Alkaline Phosphatase: 66 U/L (ref 39–117)
Bilirubin, Direct: 0.1 mg/dL (ref 0.0–0.3)
Total Bilirubin: 0.4 mg/dL (ref 0.2–1.2)
Total Protein: 8.4 g/dL — ABNORMAL HIGH (ref 6.0–8.3)

## 2022-08-13 LAB — BASIC METABOLIC PANEL
BUN: 8 mg/dL (ref 6–23)
CO2: 29 mEq/L (ref 19–32)
Calcium: 9.9 mg/dL (ref 8.4–10.5)
Chloride: 104 mEq/L (ref 96–112)
Creatinine, Ser: 0.6 mg/dL (ref 0.40–1.20)
GFR: 80.27 mL/min (ref >60.00)
Glucose, Bld: 83 mg/dL (ref 70–99)
Potassium: 4.2 mEq/L (ref 3.5–5.1)
Sodium: 141 mEq/L (ref 135–145)

## 2022-08-13 LAB — LIPID PANEL
Cholesterol: 291 mg/dL — ABNORMAL HIGH (ref 0–200)
HDL: 73 mg/dL (ref >39.00)
LDL Cholesterol: 197 mg/dL — ABNORMAL HIGH (ref 0–99)
NonHDL: 218.15
Total CHOL/HDL Ratio: 4
Triglycerides: 105 mg/dL (ref 0.0–149.0)
VLDL: 21 mg/dL (ref 0.0–40.0)

## 2022-08-13 LAB — TSH: TSH: 1.09 u[IU]/mL (ref 0.35–5.50)

## 2022-08-13 LAB — HEMOGLOBIN A1C: Hgb A1c MFr Bld: 6.1 % (ref 4.6–6.5)

## 2022-08-13 LAB — VITAMIN D 25 HYDROXY (VIT D DEFICIENCY, FRACTURES): VITD: 14.3 ng/mL — ABNORMAL LOW (ref 30.00–100.00)

## 2022-08-13 NOTE — Progress Notes (Signed)
Patient ID: Theresa Arnold, female   DOB: [birth year], 89 y.o.   MRN: [id number]        Chief Complaint: follow up right neck mass, htn, hld, hyperglycemia       HPI:  Theresa Arnold is a 89 y.o. female here with c/o being seen recently twice in the past 6 ws at [facility] with tender infected right neck mass below the angle of the jaw, now improved but moderate size persists, minimal tender, no overlying skin change, denies fever, hchills,  Pt denies fever, wt loss, night sweats, loss of appetite, or other constitutional symptoms  Pt denies chest pain, increased sob or doe, wheezing, orthopnea, PND, increased LE swelling, palpitations, dizziness or syncope.   Pt denies polydipsia, polyuria, or new focal neuro s/s.          Wt Readings from Last 3 Encounters:  08/13/22 137 lb (62.1 kg)  06/12/21 139 lb (63 kg)  08/15/20 120 lb (54.4 kg)   BP Readings from Last 3 Encounters:  08/13/22 130/72  06/12/21 (!) 142/70  05/25/21 (!) 119/58         Past Medical History:  Diagnosis Date   CERVICAL RADICULOPATHY, LEFT 09/03/2010   Cervical spondylosis 11/10/2011   COPD (chronic obstructive pulmonary disease) ([place]) 11/10/2011   Diverticulitis    HTN (hypertension) 06/13/2021   HYPERLIPIDEMIA 09/03/2010   Sigmoid stricture ([place])    Past Surgical History:  Procedure Laterality Date   COLONOSCOPY     COLOSTOMY REVERSAL N/A 09/04/2019   Procedure: PRIMARY COLOCOLOSTOMY REVERSAL AND TAKEDOWN OF HARTMAN;  Surgeon: [name], MD;  Location: [facility];  Service: General;  Laterality: N/A;   EYE SURGERY     bilateral cataracts with lens implants   LAPAROSCOPIC PARTIAL COLECTOMY N/A 03/22/2019   Procedure: LAPAROSCOPY, SPLENIC FLEXURE MOBILIZATION WITH OPEN HARTMANN PROCEDURE;  Surgeon: [name], MD;  Location: [facility];  Service: General;  Laterality: N/A;   LAPAROTOMY N/A 07/22/2020   Procedure: EXPLORATORY LAPAROTOMY lysis of adhesions;  Surgeon: Coralie Keens, MD;  Location: [facility];  Service:  General;  Laterality: N/A;    reports that she quit smoking about 11 years ago. Her smoking use included cigarettes. She has a 30.00 pack-year smoking history. She has never been exposed to tobacco smoke. She has never used smokeless tobacco. She reports current alcohol use. She reports that she does not use drugs. family history includes Breast cancer in her sister; Colon cancer in her sister; Heart disease in her father; Hip fracture in her mother; Hypertension in an other family member. Allergies  Allergen Reactions   Doxycycline Nausea And Vomiting   Current Outpatient Medications on File Prior to Visit  Medication Sig Dispense Refill   aspirin EC 325 MG tablet Take 325 mg by mouth daily.     No current facility-administered medications on file prior to visit.        ROS:  All others reviewed and negative.  Objective        PE:  BP 130/72 (BP Location: Right Arm, Patient Position: Sitting, Cuff Size: Normal)   Pulse (!) 58   Temp 97.8 F (36.6 C) (Oral)   Ht 5' (1.524 m)   Wt 137 lb (62.1 kg)   SpO2 96%   BMI 26.76 kg/m                 Constitutional: Pt appears in NAD               HENT: Head:  NCAT.                Right Ear: External ear normal.                 Left Ear: External ear normal.                Eyes: . Pupils are equal, round, and reactive to light. Conjunctivae and EOM are normal               Nose: without d/c or deformity               Neck: Neck supple. Gross normal ROM, right neck with > 1 cm subq mass below the right angle of jaw, mobile firm without other mass or LA, no overlying skin change               Cardiovascular: Normal rate and regular rhythm.                 Pulmonary/Chest: Effort normal and breath sounds without rales or wheezing.                Abd:  Soft, NT, ND, + BS, no organomegaly               Neurological: Pt is alert. At baseline orientation, motor grossly intact               Skin: Skin is warm. No rashes, no other new lesions, LE  edema - none               Psychiatric: Pt behavior is normal without agitation   Micro: none  Cardiac tracings I have personally interpreted today:  none  Pertinent Radiological findings (summarize): none   Lab Results  Component Value Date   WBC 7.2 08/13/2022   HGB 14.6 08/13/2022   HCT 44.7 08/13/2022   PLT 189.0 08/13/2022   GLUCOSE 83 08/13/2022   CHOL 291 (H) 08/13/2022   TRIG 105.0 08/13/2022   HDL 73.00 08/13/2022   LDLDIRECT 142.7 11/10/2011   LDLCALC 197 (H) 08/13/2022   ALT 7 08/13/2022   AST 22 08/13/2022   NA 141 08/13/2022   K 4.2 08/13/2022   CL 104 08/13/2022   CREATININE 0.60 08/13/2022   BUN 8 08/13/2022   CO2 29 08/13/2022   TSH 1.09 08/13/2022   INR 0.97 04/18/2018   HGBA1C 6.1 08/13/2022   Assessment/Plan:  Theresa Arnold is a 89 y.o. Black or African American [2] female with  has a past medical history of CERVICAL RADICULOPATHY, LEFT (09/03/2010), Cervical spondylosis (11/10/2011), COPD (chronic obstructive pulmonary disease) ([place]) (11/10/2011), Diverticulitis, HTN (hypertension) (06/13/2021), HYPERLIPIDEMIA (09/03/2010), and Sigmoid stricture ([place]).  HLD (hyperlipidemia) Lab Results  Component Value Date   LDLCALC 197 (H) 08/13/2022   Severe uncontrolled, declines statin, for lower chol diet   HTN (hypertension) BP Readings from Last 3 Encounters:  08/13/22 130/72  06/12/21 (!) 142/70  05/25/21 (!) 119/58   Stable, pt to continue medical treatment  - diet wt control, low salt   Hyperglycemia Lab Results  Component Value Date   HGBA1C 6.1 08/13/2022   Stable, pt to continue current medical treatment  - diet, wt control, excercise   Vitamin D deficiency Last vitamin D Lab Results  Component Value Date   VD25OH 14.30 (L) 08/13/2022   Low, to start oral replacement   Lump in neck Suspect this is benign, but has been recently infected x 2 requiring antibx, now  for ENT referral for management  Followup: Return in about 6 months  (around 02/11/2023).  Cathlean Cower, MD 08/13/2022 8:38 PM [facility]

## 2022-08-13 NOTE — Assessment & Plan Note (Signed)
Lab Results  Component Value Date   LDLCALC 197 (H) 08/13/2022   Severe uncontrolled, declines statin, for lower chol diet

## 2022-08-13 NOTE — Assessment & Plan Note (Signed)
Suspect this is benign, but has been recently infected x 2 requiring antibx, now for ENT referral for management

## 2022-08-13 NOTE — Patient Instructions (Signed)
You will be contacted regarding the referral for: ENT for the neck  Please continue all other medications as before, and refills have been done if requested.  Please have the pharmacy call with any other refills you may need.  Please continue your efforts at being more active, low cholesterol diet, and weight control.  You are otherwise up to date with prevention measures today.  Please keep your appointments with your specialists as you may have planned  Please go to the LAB at the blood drawing area for the tests to be done  You will be contacted by phone if any changes need to be made immediately.  Otherwise, you will receive a letter about your results with an explanation, but please check with MyChart first.  Please remember to sign up for MyChart if you have not done so, as this will be important to you in the future with finding out test results, communicating by private email, and scheduling acute appointments online when needed.  Please make an Appointment to return in 6 months, or sooner if needed

## 2022-08-13 NOTE — Assessment & Plan Note (Signed)
Lab Results  Component Value Date   HGBA1C 6.1 08/13/2022   Stable, pt to continue current medical treatment  - diet, wt control, excercise

## 2022-08-13 NOTE — Assessment & Plan Note (Signed)
BP Readings from Last 3 Encounters:  08/13/22 130/72  06/12/21 (!) 142/70  05/25/21 (!) 119/58   Stable, pt to continue medical treatment  - diet wt control, low salt

## 2022-08-13 NOTE — Assessment & Plan Note (Signed)
Last vitamin D Lab Results  Component Value Date   VD25OH 14.30 (L) 08/13/2022   Low, to start oral replacement

## 2022-08-14 ENCOUNTER — Encounter: Payer: Self-pay | Admitting: Internal Medicine

## 2022-09-18 ENCOUNTER — Emergency Department (HOSPITAL_BASED_OUTPATIENT_CLINIC_OR_DEPARTMENT_OTHER): Payer: Medicare Other | Admitting: Radiology

## 2022-09-18 ENCOUNTER — Encounter (HOSPITAL_BASED_OUTPATIENT_CLINIC_OR_DEPARTMENT_OTHER): Payer: Self-pay | Admitting: Emergency Medicine

## 2022-09-18 ENCOUNTER — Emergency Department (HOSPITAL_BASED_OUTPATIENT_CLINIC_OR_DEPARTMENT_OTHER)
Admission: EM | Admit: 2022-09-18 | Discharge: 2022-09-18 | Disposition: A | Discharge status: Home / Self Care | Payer: Medicare Other | Attending: Emergency Medicine | Admitting: Emergency Medicine

## 2022-09-18 ENCOUNTER — Other Ambulatory Visit: Payer: Self-pay

## 2022-09-18 DIAGNOSIS — M79672 Pain in left foot: Secondary | ICD-10-CM | POA: Diagnosis present

## 2022-09-18 NOTE — ED Triage Notes (Addendum)
PT reports left foot pain and swelling for 2-3 days. She reports swelling has gone down some. No other complaints. Walked to room from waiting room. Still very active. After getting on to bed, she became flushed and little bit clammy.  Takes no prescription meds. Pt reports pain is worse with ambulating and movement. Mildly tender to palpation. Pulse intact.

## 2022-09-18 NOTE — Discharge Instructions (Addendum)
The pain that you are experiencing may be related to a tendinitis such as plantar fasciitis, or possible heel spur.  Please read over the included instructions and follow-up with your primary care doctor in the clinic.  I recommend an exercise of rolling a tennis ball under your right foot for 10 minutes at a time, while you are sitting down.  You can do this 3 times a day for the next week.

## 2022-09-18 NOTE — ED Provider Notes (Signed)
[facility] Provider Note   CSN: [id number] Arrival date & time: 09/18/22  1019     History  Chief Complaint  Patient presents with   Theresa Arnold is a 89 y.o. female presented to ED complaining of pain at the bottom of her left foot.  This been ongoing for about 3 days.  Denies falls, traumas, injuries.  It is worse when she gets out of bed and puts pressure on it.  HPI     Home Medications Prior to Admission medications   Medication Sig Start Date End Date Taking? Authorizing Provider  aspirin EC 325 MG tablet Take 325 mg by mouth daily.    [provider]      Allergies    Doxycycline    Review of Systems   Review of Systems  Physical Exam Updated Vital Signs BP (!) 147/59   Pulse (!) 58   Temp 97.9 F (36.6 C) (Oral)   Resp 18   SpO2 96%  Physical Exam Constitutional:      General: She is not in acute distress. HENT:     Head: Normocephalic and atraumatic.  Eyes:     Conjunctiva/sclera: Conjunctivae normal.     Pupils: Pupils are equal, round, and reactive to light.  Cardiovascular:     Rate and Rhythm: Normal rate and regular rhythm.  Pulmonary:     Effort: Pulmonary effort is normal. No respiratory distress.  Musculoskeletal:     Comments: Full range of motion of all the joints including the toes and the ankle without any tenderness.  Focal tenderness at the base of the left heel and mild tenderness along the plantar arch  Skin:    General: Skin is warm and dry.  Neurological:     General: No focal deficit present.     Mental Status: She is alert. Mental status is at baseline.  Psychiatric:        Mood and Affect: Mood normal.        Behavior: Behavior normal.     ED Results / Procedures / Treatments   Labs (all labs ordered are listed, but only abnormal results are displayed) Labs Reviewed - No data to display  EKG None  Radiology DG Foot Complete Left  Result Date:  09/18/2022 CLINICAL DATA:  Left foot pain and swelling. EXAM: LEFT FOOT - COMPLETE 3+ VIEW COMPARISON:  None Available. FINDINGS: No acute fracture or dislocation. Suspected postsurgical changes from prior medial first metatarsal head osteotomy. Mild first MTP joint osteoarthritis. Osteopenia. Soft tissues are unremarkable. IMPRESSION: 1. No acute osseous abnormality. 2. Mild first MTP joint osteoarthritis. Electronically Signed   By: Titus Dubin M.D.   On: 09/18/2022 11:09    Procedures Procedures    Medications Ordered in ED Medications - No data to display  ED Course/ Medical Decision Making/ A&P                             Medical Decision Making Amount and/or Complexity of Data Reviewed Radiology: ordered.   Patient is here with left foot pain.  Differential would include ankle sprain versus tendinitis versus gout versus bone spurs vs other  X-rays ordered and personally reviewed and interpreted, with no acute injuries or fracture noted.  Clinically I suspect is most consistent with a possible case of plantar fasciitis versus bone spur.  Lower suspicion for infection or fracture.  We discussed a brief course of NSAIDs in addition with her Tylenol at home, and follow-up with her PCP.  We also discussed exercises including tennis ball rolling under the foot.  She verbalized understanding.  Her son was present at the bedside for this history and exam         Final Clinical Impression(s) / ED Diagnoses Final diagnoses:  Left foot pain    Rx / DC Orders ED Discharge Orders     None         Wyvonnia Dusky, MD 09/18/22 1155

## 2022-09-18 NOTE — ED Notes (Signed)
X-ray at bedside

## 2022-09-18 NOTE — ED Notes (Signed)
EDP at bedside  

## 2022-09-18 NOTE — ED Notes (Signed)
Pt d/c home with visitor per MD order. Discharge summary reviewed with pt, pt verbalizes understanding. Ambulatory. No s/s of acute distress noted at discharge.

## 2022-09-29 ENCOUNTER — Encounter (INDEPENDENT_AMBULATORY_CARE_PROVIDER_SITE_OTHER): Payer: Medicare Other | Admitting: Ophthalmology

## 2022-09-29 DIAGNOSIS — H3509 Other intraretinal microvascular abnormalities: Secondary | ICD-10-CM

## 2022-09-29 DIAGNOSIS — H353132 Nonexudative age-related macular degeneration, bilateral, intermediate dry stage: Secondary | ICD-10-CM

## 2022-09-29 DIAGNOSIS — H43813 Vitreous degeneration, bilateral: Secondary | ICD-10-CM | POA: Diagnosis not present

## 2022-09-30 ENCOUNTER — Ambulatory Visit (INDEPENDENT_AMBULATORY_CARE_PROVIDER_SITE_OTHER): Payer: Medicare Other | Admitting: Podiatry

## 2022-09-30 ENCOUNTER — Encounter: Payer: Self-pay | Admitting: Podiatry

## 2022-09-30 DIAGNOSIS — L84 Corns and callosities: Secondary | ICD-10-CM

## 2022-09-30 DIAGNOSIS — M2012 Hallux valgus (acquired), left foot: Secondary | ICD-10-CM | POA: Diagnosis not present

## 2022-09-30 DIAGNOSIS — M21612 Bunion of left foot: Secondary | ICD-10-CM

## 2022-09-30 NOTE — Patient Instructions (Signed)
Look for urea 40% cream or ointment and apply to the thickened dry skin / calluses. This can be bought over the counter, at a pharmacy or online such as [employer].    Look for Voltaren gel at the pharmacy over the counter or online (also known as diclofenac 1% gel). Apply to the painful areas 3-4x daily with the supplied dosing card. Allow to dry for 10 minutes before going into socks/shoes

## 2022-09-30 NOTE — Progress Notes (Signed)
  Subjective:  Patient ID: Theresa Arnold, female    DOB: [birth year],  MRN: [id number]  Chief Complaint  Patient presents with   Foot Pain    np left foot swelling and had xray with urgent care    89 y.o. female presents with the above complaint. History confirmed with patient.  This has been worsening recently.  Objective:  Physical Exam: warm, good capillary refill, no trophic changes or ulcerative lesions, normal DP and PT pulses, normal sensory exam, and she has tenderness around the first MTPJ around the medial eminence of the bunion with a painful callus here.   Radiographs: Radiographs reviewed from urgent care 09/18/2022 show no fracture or dislocation, there are mild midfoot degenerative changes. Assessment:   1. Callus of foot   2. Hallux valgus with bunions, left      Plan:  Patient was evaluated and treated and all questions answered.  We discussed operative and nonop treatment for the bunion deformity.  She has a callus here as well.  I recommended utilizing topical Voltaren gel and urea for the callus.  Callus was debrided as a courtesy today.  We discussed proper shoes and offloading with silicone pads.  She will return to see me as needed.  Do not think operative treatment would be warranted at her age  Return if symptoms worsen or fail to improve.

## 2023-01-27 ENCOUNTER — Encounter (INDEPENDENT_AMBULATORY_CARE_PROVIDER_SITE_OTHER): Payer: Medicare Other | Admitting: Ophthalmology

## 2023-02-04 ENCOUNTER — Encounter (INDEPENDENT_AMBULATORY_CARE_PROVIDER_SITE_OTHER): Payer: Medicare Other | Admitting: Ophthalmology

## 2023-06-09 ENCOUNTER — Telehealth: Payer: Self-pay | Admitting: Internal Medicine

## 2023-06-09 NOTE — Telephone Encounter (Signed)
Son dropped off [place] paperwork to be filled out by provider. Please call when form is ready for pick up.

## 2023-06-14 NOTE — Telephone Encounter (Signed)
Placed on providers desk

## 2023-06-16 NOTE — Telephone Encounter (Signed)
Called and let son know paperwork is ready for pickup and placed up front.

## 2023-07-20 ENCOUNTER — Ambulatory Visit: Payer: BC Managed Care – PPO | Admitting: Internal Medicine

## 2023-08-05 ENCOUNTER — Ambulatory Visit: Payer: BC Managed Care – PPO | Admitting: Internal Medicine

## 2023-08-17 ENCOUNTER — Encounter: Payer: Self-pay | Admitting: Internal Medicine

## 2023-08-17 ENCOUNTER — Ambulatory Visit (INDEPENDENT_AMBULATORY_CARE_PROVIDER_SITE_OTHER): Payer: Non-veteran care | Admitting: Internal Medicine

## 2023-08-17 VITALS — BP 120/72 | HR 88 | Temp 98.3°F | Ht 60.0 in

## 2023-08-17 DIAGNOSIS — L219 Seborrheic dermatitis, unspecified: Secondary | ICD-10-CM | POA: Diagnosis not present

## 2023-08-17 DIAGNOSIS — E538 Deficiency of other specified B group vitamins: Secondary | ICD-10-CM | POA: Diagnosis not present

## 2023-08-17 DIAGNOSIS — E559 Vitamin D deficiency, unspecified: Secondary | ICD-10-CM

## 2023-08-17 DIAGNOSIS — R296 Repeated falls: Secondary | ICD-10-CM

## 2023-08-17 DIAGNOSIS — R21 Rash and other nonspecific skin eruption: Secondary | ICD-10-CM | POA: Diagnosis not present

## 2023-08-17 DIAGNOSIS — I10 Essential (primary) hypertension: Secondary | ICD-10-CM

## 2023-08-17 DIAGNOSIS — R739 Hyperglycemia, unspecified: Secondary | ICD-10-CM

## 2023-08-17 DIAGNOSIS — L700 Acne vulgaris: Secondary | ICD-10-CM | POA: Insufficient documentation

## 2023-08-17 DIAGNOSIS — Z0001 Encounter for general adult medical examination with abnormal findings: Secondary | ICD-10-CM

## 2023-08-17 DIAGNOSIS — R531 Weakness: Secondary | ICD-10-CM | POA: Diagnosis not present

## 2023-08-17 DIAGNOSIS — E7800 Pure hypercholesterolemia, unspecified: Secondary | ICD-10-CM

## 2023-08-17 DIAGNOSIS — R269 Unspecified abnormalities of gait and mobility: Secondary | ICD-10-CM | POA: Diagnosis not present

## 2023-08-17 LAB — HEPATIC FUNCTION PANEL
ALT: 15 U/L (ref 0–35)
AST: 44 U/L — ABNORMAL HIGH (ref 0–37)
Albumin: 3.7 g/dL (ref 3.5–5.2)
Alkaline Phosphatase: 51 U/L (ref 39–117)
Bilirubin, Direct: 0.1 mg/dL (ref 0.0–0.3)
Total Bilirubin: 0.7 mg/dL (ref 0.2–1.2)
Total Protein: 7.3 g/dL (ref 6.0–8.3)

## 2023-08-17 LAB — CBC WITH DIFFERENTIAL/PLATELET
Basophils Absolute: 0 10*3/uL (ref 0.0–0.1)
Basophils Relative: 0.5 % (ref 0.0–3.0)
Eosinophils Absolute: 0 10*3/uL (ref 0.0–0.7)
Eosinophils Relative: 0.3 % (ref 0.0–5.0)
HCT: 42.6 % (ref 36.0–46.0)
Hemoglobin: 14 g/dL (ref 12.0–15.0)
Lymphocytes Relative: 18.5 % (ref 12.0–46.0)
Lymphs Abs: 1.1 10*3/uL (ref 0.7–4.0)
MCHC: 33 g/dL (ref 30.0–36.0)
MCV: 107.9 fL — ABNORMAL HIGH (ref 78.0–100.0)
Monocytes Absolute: 0.3 10*3/uL (ref 0.1–1.0)
Monocytes Relative: 5.2 % (ref 3.0–12.0)
Neutro Abs: 4.5 10*3/uL (ref 1.4–7.7)
Neutrophils Relative %: 75.5 % (ref 43.0–77.0)
Platelets: 140 10*3/uL — ABNORMAL LOW (ref 150.0–400.0)
RBC: 3.95 Mil/uL (ref 3.87–5.11)
RDW: 15.5 % (ref 11.5–15.5)
WBC: 6 10*3/uL (ref 4.0–10.5)

## 2023-08-17 LAB — LIPID PANEL
Cholesterol: 210 mg/dL — ABNORMAL HIGH (ref 0–200)
HDL: 62.6 mg/dL (ref >39.00)
LDL Cholesterol: 131 mg/dL — ABNORMAL HIGH (ref 0–99)
NonHDL: 147.04
Total CHOL/HDL Ratio: 3
Triglycerides: 81 mg/dL (ref 0.0–149.0)
VLDL: 16.2 mg/dL (ref 0.0–40.0)

## 2023-08-17 LAB — BASIC METABOLIC PANEL
BUN: 11 mg/dL (ref 6–23)
CO2: 25 meq/L (ref 19–32)
Calcium: 9.4 mg/dL (ref 8.4–10.5)
Chloride: 100 meq/L (ref 96–112)
Creatinine, Ser: 0.75 mg/dL (ref 0.40–1.20)
GFR: 70.69 mL/min (ref >60.00)
Glucose, Bld: 88 mg/dL (ref 70–99)
Potassium: 3.9 meq/L (ref 3.5–5.1)
Sodium: 138 meq/L (ref 135–145)

## 2023-08-17 LAB — HEMOGLOBIN A1C: Hgb A1c MFr Bld: 5.3 % (ref 4.6–6.5)

## 2023-08-17 LAB — TSH: TSH: 1.09 u[IU]/mL (ref 0.35–5.50)

## 2023-08-17 LAB — VITAMIN B12: Vitamin B-12: 829 pg/mL (ref 211–911)

## 2023-08-17 LAB — VITAMIN D 25 HYDROXY (VIT D DEFICIENCY, FRACTURES): VITD: 8.95 ng/mL — ABNORMAL LOW (ref 30.00–100.00)

## 2023-08-17 MED ORDER — TRIAMCINOLONE ACETONIDE 0.1 % EX CREA: 1.0000 | TOPICAL_CREAM | Freq: Two times a day (BID) | CUTANEOUS | 1 refills | Status: DC

## 2023-08-17 NOTE — Progress Notes (Signed)
 Letter sent, cont same tx except  The test results show that your current treatment is OK, as the tests are stable, except the Vitamin D  level is low  Please take OTC Vitamin D3 at 2000 units per day, indefinitely, Otherwise.  Please continue the same plan.

## 2023-08-17 NOTE — Patient Instructions (Signed)
 Please take all new medication as prescribed - the cream  Please continue all other medications as before, and refills have been done if requested.  Please have the pharmacy call with any other refills you may need.  Please continue your efforts at being more active, low cholesterol diet, and weight control.  You are otherwise up to date with prevention measures today.  Please keep your appointments with your specialists as you may have planned  You will be contacted regarding the referral for: dermatology, and Home Health with Physical Therapy, RN nurse and Aide  Please go to the LAB at the blood drawing area for the tests to be done  You will be contacted by phone if any changes need to be made immediately.  Otherwise, you will receive a letter about your results with an explanation, but please check with MyChart first.  Please make an Appointment to return in 6 months, or sooner if needed

## 2023-08-17 NOTE — Progress Notes (Signed)
 Patient ID: Theresa Arnold, female   DOB: Aug 09, 1931, 89 y.o.   MRN: [id number]         Chief Complaint:: wellness exam and Medical Management of Chronic Issues (Unsteady when walking says her balance is gone has been going on for about 4 months )  , itchy dry skin to legs, hld, htn, hyperglycemia, low vit d       HPI:  Theresa Arnold is a 89 y.o. female here for wellness exam; declines all immunizations, and dxa, o/w up to date                        Also has fallen x 3 without injury in the past month with worsening generalized weakness.  Also with dry itchy skin to both legs with scaly rash.  Pt denies chest pain, increased sob or doe, wheezing, orthopnea, PND, increased LE swelling, palpitations, dizziness or syncope.   Pt denies polydipsia, polyuria, or new focal neuro s/s.    Pt denies fever, wt loss, night sweats, loss of appetite, or other constitutional symptoms, in fact has gained wt recently.     Wt Readings from Last 3 Encounters:  08/13/22 137 lb (62.1 kg)  06/12/21 139 lb (63 kg)  08/15/20 120 lb (54.4 kg)   BP Readings from Last 3 Encounters:  08/17/23 120/72  09/18/22 (!) 147/59  08/13/22 130/72   Immunization History  Administered Date(s) Administered   Influenza Split 04/12/2012   Influenza-Unspecified 04/13/2015   PFIZER Comirnaty(Gray Top)Covid-19 Tri-Sucrose Vaccine 08/01/2019, 08/22/2019, 04/19/2020   Pneumococcal Conjugate-13 08/16/2013, 11/24/2017   Tdap 11/24/2017   Health Maintenance Due  Topic Date Due   Medicare Annual Wellness (AWV)  Never done   Zoster Vaccines- Shingrix (1 of 2) Never done   DEXA SCAN  Never done   Pneumonia Vaccine 13+ Years old (2 of 2 - PPSV23 or PCV20) 01/19/2018   COVID-19 Vaccine (4 - 2024-25 season) 03/14/2023      Past Medical History:  Diagnosis Date   CERVICAL RADICULOPATHY, LEFT 09/03/2010   Cervical spondylosis 11/10/2011   COPD (chronic obstructive pulmonary disease) (HCC) 11/10/2011   Diverticulitis    HTN  (hypertension) 06/13/2021   HYPERLIPIDEMIA 09/03/2010   Sigmoid stricture (HCC)    Past Surgical History:  Procedure Laterality Date   COLONOSCOPY     COLOSTOMY REVERSAL N/A 09/04/2019   Procedure: PRIMARY COLOCOLOSTOMY REVERSAL AND TAKEDOWN OF HARTMAN;  Surgeon: Gladis Cough, MD;  Location: [facility];  Service: General;  Laterality: N/A;   EYE SURGERY     bilateral cataracts with lens implants   LAPAROSCOPIC PARTIAL COLECTOMY N/A 03/22/2019   Procedure: LAPAROSCOPY, SPLENIC FLEXURE MOBILIZATION WITH OPEN HARTMANN PROCEDURE;  Surgeon: Gladis Cough, MD;  Location: [facility];  Service: General;  Laterality: N/A;   LAPAROTOMY N/A 07/22/2020   Procedure: EXPLORATORY LAPAROTOMY lysis of adhesions;  Surgeon: Vernetta Berg, MD;  Location: [facility];  Service: General;  Laterality: N/A;    reports that she quit smoking about 12 years ago. Her smoking use included cigarettes. She started smoking about 42 years ago. She has a 30 pack-year smoking history. She has never been exposed to tobacco smoke. She has never used smokeless tobacco. She reports current alcohol use. She reports that she does not use drugs. family history includes Breast cancer in her sister; Colon cancer in her sister; Heart disease in her father; Hip fracture in her mother; Hypertension in an other family member. Allergies  Allergen Reactions  Doxycycline  Nausea And Vomiting   Current Outpatient Medications on File Prior to Visit  Medication Sig Dispense Refill   aspirin EC 325 MG tablet Take 325 mg by mouth daily.     No current facility-administered medications on file prior to visit.        ROS:  All others reviewed and negative.  Objective        PE:  BP 120/72 (BP Location: Right Arm, Patient Position: Sitting, Cuff Size: Normal)   Pulse 88   Temp 98.3 F (36.8 C) (Oral)   Ht 5' (1.524 m)   SpO2 94%   BMI 26.76 kg/m                 Constitutional: Pt appears in NAD               HENT: Head: NCAT.                 Right Ear: External ear normal.                 Left Ear: External ear normal.                Eyes: . Pupils are equal, round, and reactive to light. Conjunctivae and EOM are normal               Nose: without d/c or deformity               Neck: Neck supple. Gross normal ROM               Cardiovascular: Normal rate and regular rhythm.                 Pulmonary/Chest: Effort normal and breath sounds without rales or wheezing.                Abd:  Soft, NT, ND, + BS, no organomegaly               Neurological: Pt is alert. At baseline orientation, motor grossly intact               Skin: Skin is warm. scaly rashes to bilat legs below knees, no other new lesions, LE edema -  trace pedal               Psychiatric: Pt behavior is normal without agitation   Micro: none  Cardiac tracings I have personally interpreted today:  none  Pertinent Radiological findings (summarize): none   Lab Results  Component Value Date   WBC 6.0 08/17/2023   HGB 14.0 08/17/2023   HCT 42.6 08/17/2023   PLT 140.0 (L) 08/17/2023   GLUCOSE 88 08/17/2023   CHOL 210 (H) 08/17/2023   TRIG 81.0 08/17/2023   HDL 62.60 08/17/2023   LDLDIRECT 142.7 11/10/2011   LDLCALC 131 (H) 08/17/2023   ALT 15 08/17/2023   AST 44 (H) 08/17/2023   NA 138 08/17/2023   K 3.9 08/17/2023   CL 100 08/17/2023   CREATININE 0.75 08/17/2023   BUN 11 08/17/2023   CO2 25 08/17/2023   TSH 1.09 08/17/2023   INR 0.97 04/18/2018   HGBA1C 5.3 08/17/2023   Assessment/Plan:  Theresa Arnold is a 89 y.o. Black or African American [2] female with  has a past medical history of CERVICAL RADICULOPATHY, LEFT (09/03/2010), Cervical spondylosis (11/10/2011), COPD (chronic obstructive pulmonary disease) (HCC) (11/10/2011), Diverticulitis, HTN (hypertension) (06/13/2021), HYPERLIPIDEMIA (09/03/2010), and Sigmoid stricture (HCC).  Encounter for well adult exam  with abnormal findings Age and sex appropriate education and counseling updated with regular  exercise and diet Referrals for preventative services - declines dxa Immunizations addressed - declines  all Smoking counseling  - none needed Evidence for depression or other mood disorder - none significant Most recent labs reviewed. I have personally reviewed and have noted: 1) the patient's medical and social history 2) The patient's current medications and supplements 3) The patient's height, weight, and BMI have been recorded in the chart   Gait disorder Worsening, for PT at home  Generalized weakness Also for lab today, and HH to assess home as well with RN and aide  HLD (hyperlipidemia) Lab Results  Component Value Date   LDLCALC 131 (H) 08/17/2023   Uncontrolled,, pt declines statin for now  HTN (hypertension) BP Readings from Last 3 Encounters:  08/17/23 120/72  09/18/22 (!) 147/59  08/13/22 130/72   Stable, pt for diet, wt control   Hyperglycemia Lab Results  Component Value Date   HGBA1C 5.3 08/17/2023   Stable, pt to continue current medical treatment  - diet, wt control  Vitamin D  deficiency Last vitamin D  Lab Results  Component Value Date   VD25OH 8.95 (L) 08/17/2023   Low, to start oral replacement   Seborrheic dermatitis Mild to mod, for triam cr prn, refer dermatology  to f/u any worsening symptoms or concerns  Followup: Return in about 6 months (around 02/14/2024).  Lynwood Rush, MD 08/21/2023 9:12 PM [facility]

## 2023-08-21 ENCOUNTER — Encounter: Payer: Self-pay | Admitting: Internal Medicine

## 2023-08-21 DIAGNOSIS — Z0001 Encounter for general adult medical examination with abnormal findings: Secondary | ICD-10-CM | POA: Insufficient documentation

## 2023-08-21 NOTE — Assessment & Plan Note (Signed)
 Age and sex appropriate education and counseling updated with regular exercise and diet Referrals for preventative services - declines dxa Immunizations addressed - declines  all Smoking counseling  - none needed Evidence for depression or other mood disorder - none significant Most recent labs reviewed. I have personally reviewed and have noted: 1) the patient's medical and social history 2) The patient's current medications and supplements 3) The patient's height, weight, and BMI have been recorded in the chart

## 2023-08-21 NOTE — Assessment & Plan Note (Signed)
 Worsening, for PT at home

## 2023-08-21 NOTE — Assessment & Plan Note (Signed)
 Lab Results  Component Value Date   LDLCALC 131 (H) 08/17/2023   Uncontrolled,, pt declines statin for now

## 2023-08-21 NOTE — Assessment & Plan Note (Signed)
 BP Readings from Last 3 Encounters:  08/17/23 120/72  09/18/22 (!) 147/59  08/13/22 130/72   Stable, pt for diet, wt control

## 2023-08-21 NOTE — Assessment & Plan Note (Signed)
 Also for lab today, and HH to assess home as well with RN and aide

## 2023-08-21 NOTE — Assessment & Plan Note (Signed)
 Lab Results  Component Value Date   HGBA1C 5.3 08/17/2023   Stable, pt to continue current medical treatment  - diet, wt control

## 2023-08-21 NOTE — Assessment & Plan Note (Signed)
 Last vitamin D  Lab Results  Component Value Date   VD25OH 8.95 (L) 08/17/2023   Low, to start oral replacement

## 2023-08-21 NOTE — Assessment & Plan Note (Addendum)
 Mild to mod, for triam cr prn, refer dermatology  to f/u any worsening symptoms or concerns

## 2023-09-01 ENCOUNTER — Telehealth: Payer: Self-pay | Admitting: Radiology

## 2023-09-01 NOTE — Telephone Encounter (Signed)
 Ok to forward to [facility] since I have no influence or information about this

## 2023-09-01 NOTE — Telephone Encounter (Unsigned)
 Copied from CRM [id number]. Topic: Referral - Question >> Sep 01, 2023  4:13 PM Fredrich Romans wrote: Reason for CRM: patients son called in stating that the order for PT was sent on 08/17/2023 for his mom to receive Physical therapy.However he hasn't heard anything from anyone she would like to know what's going on?

## 2023-09-10 ENCOUNTER — Other Ambulatory Visit: Payer: Self-pay

## 2023-09-10 ENCOUNTER — Emergency Department (HOSPITAL_COMMUNITY)
Admission: EM | Admit: 2023-09-10 | Discharge: 2023-09-10 | Disposition: A | Discharge status: Home / Self Care | Payer: No Typology Code available for payment source | Attending: Emergency Medicine | Admitting: Emergency Medicine

## 2023-09-10 DIAGNOSIS — R159 Full incontinence of feces: Secondary | ICD-10-CM | POA: Diagnosis present

## 2023-09-10 DIAGNOSIS — Z7982 Long term (current) use of aspirin: Secondary | ICD-10-CM | POA: Insufficient documentation

## 2023-09-10 DIAGNOSIS — R109 Unspecified abdominal pain: Secondary | ICD-10-CM | POA: Insufficient documentation

## 2023-09-10 LAB — CBC WITH DIFFERENTIAL/PLATELET
Abs Immature Granulocytes: 0.06 10*3/uL (ref 0.00–0.07)
Basophils Absolute: 0 10*3/uL (ref 0.0–0.1)
Basophils Relative: 0 %
Eosinophils Absolute: 0 10*3/uL (ref 0.0–0.5)
Eosinophils Relative: 0 %
HCT: 41.9 % (ref 36.0–46.0)
Hemoglobin: 13.2 g/dL (ref 12.0–15.0)
Immature Granulocytes: 1 %
Lymphocytes Relative: 12 %
Lymphs Abs: 1.3 10*3/uL (ref 0.7–4.0)
MCH: 34.9 pg — ABNORMAL HIGH (ref 26.0–34.0)
MCHC: 31.5 g/dL (ref 30.0–36.0)
MCV: 110.8 fL — ABNORMAL HIGH (ref 80.0–100.0)
Monocytes Absolute: 0.5 10*3/uL (ref 0.1–1.0)
Monocytes Relative: 5 %
Neutro Abs: 9 10*3/uL — ABNORMAL HIGH (ref 1.7–7.7)
Neutrophils Relative %: 82 %
Platelets: 144 10*3/uL — ABNORMAL LOW (ref 150–400)
RBC: 3.78 MIL/uL — ABNORMAL LOW (ref 3.87–5.11)
RDW: 16.3 % — ABNORMAL HIGH (ref 11.5–15.5)
WBC: 10.9 10*3/uL — ABNORMAL HIGH (ref 4.0–10.5)
nRBC: 0 % (ref 0.0–0.2)

## 2023-09-10 LAB — COMPREHENSIVE METABOLIC PANEL
ALT: 13 U/L (ref 0–44)
AST: 33 U/L (ref 15–41)
Albumin: 3.5 g/dL (ref 3.5–5.0)
Alkaline Phosphatase: 55 U/L (ref 38–126)
Anion gap: 15 (ref 5–15)
BUN: 13 mg/dL (ref 8–23)
CO2: 17 mmol/L — ABNORMAL LOW (ref 22–32)
Calcium: 9.2 mg/dL (ref 8.9–10.3)
Chloride: 104 mmol/L (ref 98–111)
Creatinine, Ser: 0.63 mg/dL (ref 0.44–1.00)
GFR, Estimated: >60 mL/min (ref >60)
Glucose, Bld: 93 mg/dL (ref 70–99)
Potassium: 5.1 mmol/L (ref 3.5–5.1)
Sodium: 136 mmol/L (ref 135–145)
Total Bilirubin: 1.9 mg/dL — ABNORMAL HIGH (ref 0.0–1.2)
Total Protein: 7.7 g/dL (ref 6.5–8.1)

## 2023-09-10 LAB — MAGNESIUM: Magnesium: 2 mg/dL (ref 1.7–2.4)

## 2023-09-10 MED ORDER — ONDANSETRON 4 MG PO TBDP: ORAL_TABLET | ORAL | 0 refills | Status: DC

## 2023-09-10 MED ORDER — FLEET ENEMA RE ENEM
1.0000 | ENEMA | Freq: Once | RECTAL | Status: AC
  Administered 2023-09-10: 1 via RECTAL
  Filled 2023-09-10: qty 1

## 2023-09-10 MED ORDER — SODIUM CHLORIDE 0.9 % IV BOLUS
1000.0000 mL | Freq: Once | INTRAVENOUS | Status: AC
  Administered 2023-09-10: 1000 mL via INTRAVENOUS

## 2023-09-10 NOTE — ED Provider Notes (Signed)
 [facility] EMERGENCY DEPARTMENT AT [facility] Provider Note   CSN: [id number] Arrival date & time: 09/10/23  1006     History  Chief Complaint  Patient presents with   Abdominal Pain   stool incontinence     Theresa Arnold is a 89 y.o. female.  89 yo F having difficulty moving her bowels.  This has been going on for some time now, reportedly 4 weeks.  She has been having some stool just come out spontaneously.  She tells me she does not feel like she can control it.  Denies any back pain.  Denies fevers denies vomiting.  She did try a laxative but without improvement.   Abdominal Pain      Home Medications Prior to Admission medications   Medication Sig Start Date End Date Taking? Authorizing Provider  ondansetron (ZOFRAN-ODT) 4 MG disintegrating tablet 4mg  ODT q4 hours prn nausea/vomit 09/10/23  Yes Melene Plan, DO  aspirin EC 325 MG tablet Take 325 mg by mouth daily.    [provider]  triamcinolone cream (KENALOG) 0.1 % Apply 1 Application topically 2 (two) times daily. 08/17/23 08/16/24  Corwin Levins, MD      Allergies    Doxycycline    Review of Systems   Review of Systems  Gastrointestinal:  Positive for abdominal pain.    Physical Exam Updated Vital Signs BP 103/62   Pulse 70   Temp 98.1 F (36.7 C) (Oral)   Resp 18   Ht 5' (1.524 m)   Wt 62 kg   SpO2 99%   BMI 26.69 kg/m  Physical Exam Vitals and nursing note reviewed.  Constitutional:      General: She is not in acute distress.    Appearance: She is well-developed. She is not diaphoretic.  HENT:     Head: Normocephalic and atraumatic.  Eyes:     Pupils: Pupils are equal, round, and reactive to light.  Cardiovascular:     Rate and Rhythm: Normal rate and regular rhythm.     Heart sounds: No murmur heard.    No friction rub. No gallop.  Pulmonary:     Effort: Pulmonary effort is normal.     Breath sounds: No wheezing or rales.  Abdominal:     General: There is no  distension.     Palpations: Abdomen is soft.     Tenderness: There is no abdominal tenderness.  Genitourinary:    Comments: Clay consistency stool in the vault Musculoskeletal:        General: No tenderness.     Cervical back: Normal range of motion and neck supple.  Skin:    General: Skin is warm and dry.  Neurological:     Mental Status: She is alert and oriented to person, place, and time.  Psychiatric:        Behavior: Behavior normal.     ED Results / Procedures / Treatments   Labs (all labs ordered are listed, but only abnormal results are displayed) Labs Reviewed  CBC WITH DIFFERENTIAL/PLATELET - Abnormal; Notable for the following components:      Result Value   WBC 10.9 (*)    RBC 3.78 (*)    MCV 110.8 (*)    MCH 34.9 (*)    RDW 16.3 (*)    Platelets 144 (*)    Neutro Abs 9.0 (*)    All other components within normal limits  COMPREHENSIVE METABOLIC PANEL - Abnormal; Notable for the following components:  CO2 17 (*)    Total Bilirubin 1.9 (*)    All other components within normal limits  MAGNESIUM    EKG None  Radiology No results found.  Procedures Procedures    Medications Ordered in ED Medications  sodium phosphate (FLEET) enema 1 enema (1 enema Rectal Given 09/10/23 1049)  sodium chloride 0.9 % bolus 1,000 mL (0 mLs Intravenous Stopped 09/10/23 1220)    ED Course/ Medical Decision Making/ A&P                                 Medical Decision Making Amount and/or Complexity of Data Reviewed Labs: ordered.  Risk OTC drugs.   89 yo F with a chief complaints of what sounds like encopresis.  It has been going on for 4 weeks reportedly.  She did have a significant amount of stool in the vault.  Disimpaction with some removal.  Will try an enema.  Check blood work bolus of IV fluids.  Reassess.  No anemia.  Mild leukocytosis.  No significant electrolyte abnormalities.  Patient feeling a bit better on repeat assessment.  Tolerated by mouth  without issue.  Will have her trial MiraLAX cleanout at home.  PCP follow-up.  12:21 PM:  I have discussed the diagnosis/risks/treatment options with the patient.  Evaluation and diagnostic testing in the emergency department does not suggest an emergent condition requiring admission or immediate intervention beyond what has been performed at this time.  They will follow up with PCP. We also discussed returning to the ED immediately if new or worsening sx occur. We discussed the sx which are most concerning (e.g., sudden worsening pain, fever, inability to tolerate by mouth) that necessitate immediate return. Medications administered to the patient during their visit and any new prescriptions provided to the patient are listed below.  Medications given during this visit Medications  sodium phosphate (FLEET) enema 1 enema (1 enema Rectal Given 09/10/23 1049)  sodium chloride 0.9 % bolus 1,000 mL (0 mLs Intravenous Stopped 09/10/23 1220)     The patient appears reasonably screen and/or stabilized for discharge and I doubt any other medical condition or other [facility] requiring further screening, evaluation, or treatment in the ED at this time prior to discharge.          Final Clinical Impression(s) / ED Diagnoses Final diagnoses:  Encopresis    Rx / DC Orders ED Discharge Orders          Ordered    ondansetron (ZOFRAN-ODT) 4 MG disintegrating tablet        09/10/23 1221              Melene Plan, DO 09/10/23 1221

## 2023-09-10 NOTE — Discharge Instructions (Signed)
Take 8 scoops of miralax in 32oz of whatever you would like to drink.(Gatorade comes in this size) You can also use a fleets enema which you can buy over the counter at the pharmacy.  Return for worsening abdominal pain, vomiting or fever. ? ?

## 2023-09-10 NOTE — ED Triage Notes (Signed)
 Pt BIBA from home. C/o abd pain, rectal pain and stool incontinence for 4 weeks. Had constipation, and took 1 laxative 4 days ago.   No loose stools.

## 2023-09-13 NOTE — Telephone Encounter (Signed)
 Sorry, I have nothing to add  Will continue to forward to [facility]

## 2023-09-13 NOTE — Telephone Encounter (Signed)
 Patient's son needs someone to call him concerning this - she still needs her physical therapy order to be sent to [facility] - please call son at :  [phone] - Lane Hacker

## 2023-09-20 NOTE — Telephone Encounter (Signed)
  I sent a message over to [facility] To see if pt is active, or if she can be accpeted.  Awaiting response     Copied from CRM [id number]. Topic: Referral - Status >> Sep 10, 2023  1:53 PM Theresa Arnold wrote: Reason for CRM: Patient son Theresa Arnold called stated his mother went to the hospital this morning was impacted and released from the hospital. Also stated can barely walk and condition getting worse. Theresa Arnold have not received paperwork from [place] for her to start physical therapy. Patient son is requesting a status update.

## 2023-10-07 ENCOUNTER — Ambulatory Visit: Payer: Self-pay | Admitting: Internal Medicine

## 2023-10-07 NOTE — Telephone Encounter (Signed)
 Copied from CRM [id number]. Topic: Clinical - Red Word Triage >> Oct 07, 2023  2:00 PM Denese Killings wrote: [facility] that prompted transfer to Nurse Triage: Dutch Quint was waiting for patient to get out of the bathroom and when she came out she fell. May have lost balance. She is not complaining of pain. Just want to report it.  Chief Complaint: Fall Symptoms: N/A Frequency: Today Pertinent Negatives: Patient denies pain Disposition: [] ED /[] Urgent Care (no appt availability in office) / [] Appointment(In office/virtual)/ []  [facility]/ [x] Home Care/ [] Refused Recommended Disposition /[] [facility]/ []  Follow-up with PCP Additional Notes: Patient's PT with [facility] called in to report a fall. Patient fell coming out of the bathroom 10-15 minutes prior to this call. Patient landed on her right hip. Patient did not hit head. Patient is alert and oriented. PT assessed the patient. No bruising, broken skin or swelling at this time. Patient denied injury and pain at this time. Patient stated she is able to walk around normally. Patient denied dizziness and new onset of weakness. This RN advised home care, per protocol. This RN advised patient to call back for is symptoms develop or worsen. Patient complied.   Reason for Disposition  [1] Recent fall AND [2] no injury  Answer Assessment - Initial Assessment Questions 1. MECHANISM: "How did the fall happen?"     Happened when she was coming out of the bathroom 2. DOMESTIC VIOLENCE AND ELDER ABUSE SCREENING: "Did you fall because someone pushed you or tried to hurt you?" If Yes, ask: "Are you safe now?"     N/A 3. ONSET: "When did the fall happen?" (e.g., minutes, hours, or days ago)     10-15 minutes ago 4. LOCATION: "What part of the body hit the ground?" (e.g., back, buttocks, head, hips, knees, hands, head, stomach)     Right hip hit tile floor, denies hitting head 5. INJURY: "Did you hurt (injure) yourself when you fell?" If Yes, ask:  "What did you injure? Tell me more about this?" (e.g., body area; type of injury; pain severity)"     Denies injury, states she is able to walk around normally 6. PAIN: "Is there any pain?" If Yes, ask: "How bad is the pain?" (e.g., Scale 1-10; or mild,  moderate, severe)   - NONE (0): No pain   - MILD (1-3): Doesn't interfere with normal activities    - MODERATE (4-7): Interferes with normal activities or awakens from sleep    - SEVERE (8-10): Excruciating pain, unable to do any normal activities      Denies pain 7. SIZE: For cuts, bruises, or swelling, ask: "How large is it?" (e.g., inches or centimeters)      Denies bruises and swelling, denies broken skin 9. OTHER SYMPTOMS: "Do you have any other symptoms?" (e.g., dizziness, fever, weakness; new onset or worsening).      Denies dizziness, denies weakness 10. CAUSE: "What do you think caused the fall (or falling)?" (e.g., tripped, dizzy spell)     States she lost her balance  Protocols used: Falls and [facility]

## 2023-10-21 ENCOUNTER — Telehealth: Payer: Self-pay | Admitting: Internal Medicine

## 2023-10-21 DIAGNOSIS — R296 Repeated falls: Secondary | ICD-10-CM

## 2023-10-21 DIAGNOSIS — R531 Weakness: Secondary | ICD-10-CM

## 2023-10-21 DIAGNOSIS — R269 Unspecified abnormalities of gait and mobility: Secondary | ICD-10-CM

## 2023-10-21 NOTE — Telephone Encounter (Signed)
 Copied from CRM [id number]. Topic: General - Other >> Oct 21, 2023  1:29 PM Saverio Danker wrote: Reason for CRM: Sresh from patients homehealth is calling to put in request for patient. Patient is requesting a fully electric hospital bed

## 2023-10-22 NOTE — Telephone Encounter (Signed)
 Order faxed to [facility]

## 2023-10-22 NOTE — Telephone Encounter (Signed)
Ok rx done hardcopy to [facility] 

## 2023-10-27 ENCOUNTER — Emergency Department (HOSPITAL_COMMUNITY)

## 2023-10-27 ENCOUNTER — Emergency Department (HOSPITAL_COMMUNITY)
Admission: EM | Admit: 2023-10-27 | Discharge: 2023-10-30 | Disposition: A | Discharge status: Home / Self Care | Attending: Emergency Medicine | Admitting: Emergency Medicine

## 2023-10-27 DIAGNOSIS — M549 Dorsalgia, unspecified: Secondary | ICD-10-CM | POA: Diagnosis not present

## 2023-10-27 DIAGNOSIS — W19XXXA Unspecified fall, initial encounter: Secondary | ICD-10-CM | POA: Diagnosis not present

## 2023-10-27 DIAGNOSIS — W06XXXA Fall from bed, initial encounter: Secondary | ICD-10-CM | POA: Insufficient documentation

## 2023-10-27 DIAGNOSIS — Z7982 Long term (current) use of aspirin: Secondary | ICD-10-CM | POA: Diagnosis not present

## 2023-10-27 DIAGNOSIS — J449 Chronic obstructive pulmonary disease, unspecified: Secondary | ICD-10-CM | POA: Diagnosis not present

## 2023-10-27 DIAGNOSIS — I10 Essential (primary) hypertension: Secondary | ICD-10-CM | POA: Insufficient documentation

## 2023-10-27 DIAGNOSIS — M8580 Other specified disorders of bone density and structure, unspecified site: Secondary | ICD-10-CM | POA: Diagnosis not present

## 2023-10-27 DIAGNOSIS — R0781 Pleurodynia: Secondary | ICD-10-CM | POA: Diagnosis not present

## 2023-10-27 DIAGNOSIS — M7918 Myalgia, other site: Secondary | ICD-10-CM

## 2023-10-27 DIAGNOSIS — I700 Atherosclerosis of aorta: Secondary | ICD-10-CM | POA: Diagnosis not present

## 2023-10-27 MED ORDER — FENTANYL CITRATE PF 50 MCG/ML IJ SOSY: 25.0000 ug | PREFILLED_SYRINGE | Freq: Once | INTRAMUSCULAR | Status: DC

## 2023-10-27 MED ORDER — LIDOCAINE 5 % EX PTCH
1.0000 | MEDICATED_PATCH | CUTANEOUS | Status: DC
  Administered 2023-10-27 – 2023-10-29 (×3): 1 via TRANSDERMAL
  Filled 2023-10-27 (×5): qty 1

## 2023-10-27 MED ORDER — LIDOCAINE 5 % EX PTCH: 1.0000 | MEDICATED_PATCH | CUTANEOUS | 0 refills | Status: DC

## 2023-10-27 MED ORDER — FENTANYL CITRATE PF 50 MCG/ML IJ SOSY
25.0000 ug | PREFILLED_SYRINGE | Freq: Once | INTRAMUSCULAR | Status: AC
  Administered 2023-10-27: 25 ug via INTRAMUSCULAR
  Filled 2023-10-27: qty 1

## 2023-10-27 NOTE — TOC Initial Note (Signed)
 Transition of Care [facility]) - Initial/Assessment Note    Patient Details  Name: Theresa Arnold MRN: [id number] Date of Birth: Mar 21, 1932  Transition of [facility]) CM/SW Contact:    Valley Gavia, LCSWA Phone Number: 10/27/2023, 10:04 PM  Clinical Narrative:                  CSW received consult for SNF. CSW spoke with pt's son, he states he is in agreement for SNF placement for STR as he is unable to care for pt in her current state at this time. Pt's son states pt has [facility] Medicare in addition to [place] coverage (not showing) and he provided both cards during pt's last ED visit. CSW explained insurance auth process and the need for PT approval before pt could be faxed out, son verbalized understanding and requested a call back from MD/PA for a medical update, team made aware.         Patient Goals and CMS Choice            Expected Discharge Plan and Services                                              Prior Living Arrangements/Services                       Activities of Daily Living      Permission Sought/Granted                  Emotional Assessment              Admission diagnosis:  Fall Patient Active Problem List   Diagnosis Date Noted   Encounter for well adult exam with abnormal findings 08/21/2023   Acne vulgaris 08/17/2023   Seborrheic dermatitis 08/17/2023   Lump in neck 08/13/2022   Vitamin D deficiency 08/13/2022   URI (upper respiratory infection) 06/13/2021   HTN (hypertension) 06/13/2021   Other chronic pain 08/15/2020   Gait disorder 08/15/2020   Generalized weakness 08/15/2020   Hyperglycemia 08/02/2020   Ileus, postoperative (HCC) 08/02/2020   Macrocytic anemia 08/02/2020   Hypophosphatemia 08/02/2020   Hypomagnesemia 08/02/2020   Physical deconditioning 08/02/2020   Protein calorie malnutrition (HCC) 08/02/2020   SBO (small bowel obstruction) (HCC) 07/20/2020   Thrombocytopenia (HCC) 07/20/2020    Nausea and vomiting 07/20/2020   Hypokalemia 07/20/2020   S/P colostomy takedown 09/04/2019   Nausea 06/23/2019   Abnormal LFTs 06/11/2019   Acute blood loss anemia 06/11/2019   Hypoxia 03/24/2019   Colonic partial obstruction from sigmoid stricture 03/18/2019   Polyp of sigmoid colon 03/18/2019   Oral thrush 03/18/2019   Abnormal CT scan, gastrointestinal tract    Abnormal CT scan, sigmoid colon    Diverticulitis of sigmoid colon 03/15/2019   LLQ pain 11/02/2018   Abdominal pain 09/21/2018   Venous (peripheral) insufficiency 01/12/2014   Sprain of ankle 06/28/2013   Preventative health care 11/10/2011   Cervical spondylosis 11/10/2011   OA (osteoarthritis) of knee 11/10/2011   COPD (chronic obstructive pulmonary disease) (HCC) 11/10/2011   Dizziness 11/10/2011   Benign neoplasm of colon 11/24/2010   Diverticulosis of colon 11/24/2010   Encounter for long-term (current) use of other medications 10/07/2010   HLD (hyperlipidemia) 09/03/2010   Debility 09/03/2010   SMOKER 09/10/2009   PCP:  Roslyn Coombe, MD Pharmacy:  [id number] [place],  - [address] [place] [address] Phone: [phone] Fax: [phone]     Social Drivers of Health (SDOH) Social History: SDOH Screenings   Depression (PHQ2-9): Low Risk  (08/17/2023)  Tobacco Use: Medium Risk (08/21/2023)   SDOH Interventions:     Readmission Risk Interventions     No data to display

## 2023-10-27 NOTE — ED Notes (Signed)
 Present nurse and NT try to assist pt to ambulate. Pt was unable to bare on weight or ambulate

## 2023-10-27 NOTE — ED Notes (Signed)
 Ambulated pt with walker , able to do it with minimal assist.

## 2023-10-27 NOTE — ED Triage Notes (Signed)
 Per EMS. Rolled out of bed onto the floor this morning. C/o back pain. AO x 4.  BP 142/54 HR 60 98 RA

## 2023-10-27 NOTE — ED Provider Notes (Signed)
 [facility] DEPARTMENT AT [facility] Provider Note   CSN: [id number] Arrival date & time: 10/27/23  1401     History  Chief Complaint  Patient presents with   Back Pain    Theresa Arnold is a 89 y.o. female past medical history significant for diverticulitis, hypertension, and COPD presents today for mechanical fall.  Patient rolled out of bed this morning and fell into the floor.  Patient denies head injury, neck pain, numbness, weakness, vision changes, shortness of breath, abdominal pain, or tinnitus.  Patient reports she fell onto her arm onto her left side and now has left rib pain.  Patient denies blood thinner use.   Back Pain      Home Medications Prior to Admission medications   Medication Sig Start Date End Date Taking? Authorizing Provider  lidocaine (LIDODERM) 5 % Place 1 patch onto the skin daily. Remove & Discard patch within 12 hours or as directed by MD 10/27/23  Yes Kelisha Dall N, PA-C  aspirin EC 325 MG tablet Take 325 mg by mouth daily.    [provider]  ondansetron (ZOFRAN-ODT) 4 MG disintegrating tablet 4mg  ODT q4 hours prn nausea/vomit 09/10/23   Floyd, Dan, DO  triamcinolone cream (KENALOG) 0.1 % Apply 1 Application topically 2 (two) times daily. 08/17/23 08/16/24  Roslyn Coombe, MD      Allergies    Doxycycline    Review of Systems   Review of Systems  Musculoskeletal:  Positive for arthralgias.    Physical Exam Updated Vital Signs BP (!) 139/112 (BP Location: Right Arm)   Pulse (!) 57   Temp 97.6 F (36.4 C) (Oral)   Resp 17   SpO2 100%  Physical Exam Vitals and nursing note reviewed. Exam conducted with a chaperone present.  Constitutional:      General: She is not in acute distress.    Appearance: Normal appearance. She is well-developed. She is not diaphoretic.  HENT:     Head: Normocephalic and atraumatic.     Right Ear: External ear normal.     Left Ear: External ear normal.     Nose: Nose normal.   Eyes:     Extraocular Movements: Extraocular movements intact.     Conjunctiva/sclera: Conjunctivae normal.     Pupils: Pupils are equal, round, and reactive to light.  Cardiovascular:     Rate and Rhythm: Normal rate and regular rhythm.     Pulses: Normal pulses.     Heart sounds: Normal heart sounds. No murmur heard. Pulmonary:     Effort: Pulmonary effort is normal. No respiratory distress.     Breath sounds: Normal breath sounds.  Chest:     Comments: Tenderness to palpation of the lateral left lower ribs Abdominal:     Palpations: Abdomen is soft.     Tenderness: There is no abdominal tenderness.  Musculoskeletal:        General: No swelling or deformity. Normal range of motion.     Cervical back: Normal range of motion and neck supple.  Skin:    General: Skin is warm and dry.     Capillary Refill: Capillary refill takes less than 2 seconds.  Neurological:     General: No focal deficit present.     Mental Status: She is alert and oriented to person, place, and time.  Psychiatric:        Mood and Affect: Mood normal.     ED Results / Procedures / Treatments  Labs (all labs ordered are listed, but only abnormal results are displayed) Labs Reviewed - No data to display  EKG None  Radiology DG Ribs Unilateral W/Chest Left Result Date: 10/27/2023 CLINICAL DATA:  Pain after fall EXAM: LEFT RIBS AND CHEST - 3 VIEW COMPARISON:  Chest x-ray 03/20/2019. FINDINGS: Hyperinflation. Normal cardiopericardial silhouette. Calcified aorta. No consolidation, pneumothorax or effusion. No edema. Curvature and degenerative changes of the spine. Degenerative changes of the shoulders. Overlapping cardiac leads. Apical pleural thickening. No left-sided rib fracture identified by x-ray. IMPRESSION: Hyperinflation.  No pneumothorax or effusion. No left-sided rib fracture seen by x-ray. Curvature of the spine with osteopenia and degenerative changes. Electronically Signed   By: Adrianna Horde M.D.    On: 10/27/2023 18:55    Procedures Procedures    Medications Ordered in ED Medications  lidocaine (LIDODERM) 5 % 1 patch (has no administration in time range)  fentaNYL (SUBLIMAZE) injection 25 mcg (25 mcg Intramuscular Given 10/27/23 1509)    ED Course/ Medical Decision Making/ A&P                                 Medical Decision Making Amount and/or Complexity of Data Reviewed Radiology: ordered.  Risk Prescription drug management.   This patient presents to the ED for concern of left-sided rib pain after fall differential diagnosis includes musculoskeletal pain, rib fracture, pneumothorax  Imaging Studies ordered:  I ordered imaging studies including left rib x-ray I independently visualized and interpreted imaging which showed no pneumothorax or effusion, no left-sided rib fracture seen by x-ray. I agree with the radiologist interpretation   Medicines ordered and prescription drug management:  I ordered medication including fentanyl and Lidoderm patch for pain Reevaluation of the patient after these medicines showed that the patient improved I have reviewed the patients home medicines and have made adjustments as needed   Consider for admission or further workup however patient's vital signs, physical exam, and imaging were reassuring.  Patient's symptoms likely due to musculoskeletal pain.  Patient advised to take Tylenol/Motrin as needed for pain.  Patient also prescribed Lidoderm patches to place on affected area.  Patient given incentive spirometer prior to discharge.  I feel patient is safe for discharge at this time.         Final Clinical Impression(s) / ED Diagnoses Final diagnoses:  Musculoskeletal pain    Rx / DC Orders ED Discharge Orders          Ordered    lidocaine (LIDODERM) 5 %  Every 24 hours        10/27/23 1919              Marlene Pfluger N, PA-C 10/27/23 Yale Held, MD 10/28/23 1747

## 2023-10-27 NOTE — Discharge Instructions (Addendum)
 Today you were seen for left-sided rib pain after a fall.  Please pick up your Lidoderm patches and to use as needed for pain.  You may also alternate taking Tylenol/Motrin as needed for pain.  Thank you for letting us  treat you today. After reviewing your imaging, I feel you are safe to go home. Please follow up with your PCP in the next several days and provide them with your records from this visit. Return to the Emergency Room if pain becomes severe or symptoms worsen.

## 2023-10-28 DIAGNOSIS — R0781 Pleurodynia: Secondary | ICD-10-CM | POA: Diagnosis not present

## 2023-10-28 MED ORDER — CARMEX CLASSIC LIP BALM EX OINT
TOPICAL_OINTMENT | Freq: Once | CUTANEOUS | Status: AC
  Filled 2023-10-28: qty 10

## 2023-10-28 NOTE — ED Notes (Signed)
 Pt ambulated to restroom with use of walker pt just needed assistance sitting up on the side of the bed and putting legs back in bed

## 2023-10-28 NOTE — Evaluation (Signed)
 Physical Therapy Evaluation Patient Details Name: Theresa Arnold MRN: [id number] DOB: 1931-11-11 Today's Date: 10/28/2023  History of Present Illness  Pt is an 89 y.o. female who presented to ED for mechanical fall. Past medical history significant for diverticulitis, hypertension, and COPD, left cervical radiculopathy  Clinical Impression  Pt with order for PT evaluation in ED.  Pt currently with functional limitations due to the deficits listed below (see PT Problem List). Pt will benefit from acute skilled PT to increase their independence and safety with mobility to allow discharge.  Pt reports falling in her bedroom at home and called in her son for help.  Pt currently requiring mod assist for bed mobility and min assist for sit to stands from regular height surface.  Pt states her family could assist her upon d/c however upon reviewing chart, notes state son does not feel able to provide appropriate assist.  Due to current assist level and decreased mobility with presentation s/p fall, patient would benefit from continued inpatient follow up therapy, <3 hours/day.          If plan is discharge home, recommend the following: A little help with walking and/or transfers;A little help with bathing/dressing/bathroom;Help with stairs or ramp for entrance   Can travel by private vehicle        Equipment Recommendations None recommended by PT  Recommendations for Other Services       Functional Status Assessment Patient has had a recent decline in their functional status and demonstrates the ability to make significant improvements in function in a reasonable and predictable amount of time.     Precautions / Restrictions Precautions Precautions: Fall      Mobility  Bed Mobility Overal bed mobility: Needs Assistance Bed Mobility: Supine to Sit, Sit to Supine     Supine to sit: Mod assist Sit to supine: Mod assist   General bed mobility comments: assist for trunk upright due to  left flank pain; assist for LEs onto bed    Transfers Overall transfer level: Needs assistance Equipment used: Rolling walker (2 wheels) Transfers: Sit to/from Stand Sit to Stand: Min assist           General transfer comment: verbal cues for hand placement; CGA from elevated surface, min assist from toilet with grab bar    Ambulation/Gait Ambulation/Gait assistance: Contact guard assist Gait Distance (Feet): 40 Feet Assistive device: Rolling walker (2 wheels) Gait Pattern/deviations: Step-through pattern, Decreased stride length, Shuffle, Trunk flexed, Narrow base of support Gait velocity: decr     General Gait Details: short shuffling steps, cues for "big steps" but pt not able to maintain, tends to keep RW too far forward  Stairs            Wheelchair Mobility     Tilt Bed    Modified Rankin (Stroke Patients Only)       Balance Overall balance assessment: History of Falls, Needs assistance         Standing balance support: During functional activity, Reliant on assistive device for balance, Bilateral upper extremity supported Standing balance-Leahy Scale: Poor                               Pertinent Vitals/Pain Pain Assessment Pain Assessment: Faces Faces Pain Scale: Hurts little more Pain Location: left flank Pain Descriptors / Indicators: Sore, Grimacing Pain Intervention(s): Monitored during session, Repositioned    Home Living Family/patient expects to be discharged to::  Private residence Living Arrangements: Children Available Help at Discharge: Family;Available 24 hours/day Type of Home: House         Home Layout: One level Home Equipment: Agricultural consultant (2 wheels)      Prior Function Prior Level of Function : Independent/Modified Independent             Mobility Comments: was active with HHPT prior to ED visit       Extremity/Trunk Assessment        Lower Extremity Assessment Lower Extremity Assessment:  Generalized weakness    Cervical / Trunk Assessment Cervical / Trunk Assessment: Kyphotic  Communication   Communication Communication: No apparent difficulties    Cognition Arousal: Alert Behavior During Therapy: WFL for tasks assessed/performed   PT - Cognitive impairments: No apparent impairments                         Following commands: Intact       Cueing       General Comments      Exercises     Assessment/Plan    PT Assessment Patient needs continued PT services  PT Problem List Decreased strength;Decreased activity tolerance;Decreased balance;Decreased knowledge of use of DME;Decreased mobility;Pain       PT Treatment Interventions DME instruction;Gait training;Therapeutic exercise;Balance training;Functional mobility training;Therapeutic activities;Patient/family education    PT Goals (Current goals can be found in the Care Plan section)  Acute Rehab PT Goals PT Goal Formulation: With patient Time For Goal Achievement: 11/11/23 Potential to Achieve Goals: Good    Frequency Min 2X/week     Co-evaluation               AM-PAC PT "6 Clicks" Mobility  Outcome Measure Help needed turning from your back to your side while in a flat bed without using bedrails?: A Little Help needed moving from lying on your back to sitting on the side of a flat bed without using bedrails?: A Lot Help needed moving to and from a bed to a chair (including a wheelchair)?: A Lot Help needed standing up from a chair using your arms (e.g., wheelchair or bedside chair)?: A Lot Help needed to walk in hospital room?: A Lot Help needed climbing 3-5 steps with a railing? : A Lot 6 Click Score: 13    End of Session Equipment Utilized During Treatment: Gait belt Activity Tolerance: Patient tolerated treatment well Patient left: in bed (ED hallway bed)   PT Visit Diagnosis: Difficulty in walking, not elsewhere classified (R26.2);Muscle weakness (generalized)  (M62.81)    Time: 1610-9604 PT Time Calculation (min) (ACUTE ONLY): 26 min   Charges:   PT Evaluation $PT Eval Low Complexity: 1 Low PT Treatments $Gait Training: 8-22 mins PT General Charges $$ ACUTE PT VISIT: 1 Visit       Henretta Lodge PT, DPT Physical Therapist Acute Rehabilitation Services Office: [phone]   Myna Asal Payson 10/28/2023, 2:49 PM

## 2023-10-28 NOTE — Progress Notes (Addendum)
 Awaiting PT eval. Requested order. Reviewed Media, pt's [facility] Medicare card for 2025 is there. Pt will be faxed out under [facility] for SNF per family request to expand options.

## 2023-10-28 NOTE — NC FL2 (Signed)
 [place]  MEDICAID FL2 LEVEL OF CARE FORM     IDENTIFICATION  Patient Name: Theresa Arnold Birthdate: 1932/02/21 Sex: female Admission Date (Current Location): 10/27/2023  [facility] and [place] Number:  Producer, television/film/video and Address:  [facility],  [address], [place] [address]      Provider Number: [id number]  Attending Physician Name and Address:  System, Provider Not In  Relative Name and Phone Number:  Celestine, Prim (Son)  [phone] (Mobile)    Current Level of Care: Hospital Recommended Level of Care: Skilled Nursing Facility Prior Approval Number:    Date Approved/Denied:   PASRR Number: [id number]  Discharge Plan: SNF    Current Diagnoses: Patient Active Problem List   Diagnosis Date Noted   Encounter for well adult exam with abnormal findings 08/21/2023   Acne vulgaris 08/17/2023   Seborrheic dermatitis 08/17/2023   Lump in neck 08/13/2022   Vitamin D deficiency 08/13/2022   URI (upper respiratory infection) 06/13/2021   HTN (hypertension) 06/13/2021   Other chronic pain 08/15/2020   Gait disorder 08/15/2020   Generalized weakness 08/15/2020   Hyperglycemia 08/02/2020   Ileus, postoperative (HCC) 08/02/2020   Macrocytic anemia 08/02/2020   Hypophosphatemia 08/02/2020   Hypomagnesemia 08/02/2020   Physical deconditioning 08/02/2020   Protein calorie malnutrition (HCC) 08/02/2020   SBO (small bowel obstruction) (HCC) 07/20/2020   Thrombocytopenia (HCC) 07/20/2020   Nausea and vomiting 07/20/2020   Hypokalemia 07/20/2020   S/P colostomy takedown 09/04/2019   Nausea 06/23/2019   Abnormal LFTs 06/11/2019   Acute blood loss anemia 06/11/2019   Hypoxia 03/24/2019   Colonic partial obstruction from sigmoid stricture 03/18/2019   Polyp of sigmoid colon 03/18/2019   Oral thrush 03/18/2019   Abnormal CT scan, gastrointestinal tract    Abnormal CT scan, sigmoid colon    Diverticulitis of sigmoid colon 03/15/2019   LLQ pain  11/02/2018   Abdominal pain 09/21/2018   Venous (peripheral) insufficiency 01/12/2014   Sprain of ankle 06/28/2013   Preventative health care 11/10/2011   Cervical spondylosis 11/10/2011   OA (osteoarthritis) of knee 11/10/2011   COPD (chronic obstructive pulmonary disease) (HCC) 11/10/2011   Dizziness 11/10/2011   Benign neoplasm of colon 11/24/2010   Diverticulosis of colon 11/24/2010   Encounter for long-term (current) use of other medications 10/07/2010   HLD (hyperlipidemia) 09/03/2010   Debility 09/03/2010   SMOKER 09/10/2009    Orientation RESPIRATION BLADDER Height & Weight     Self, Time, Situation, Place  Normal Continent Weight:   Height:     BEHAVIORAL SYMPTOMS/MOOD NEUROLOGICAL BOWEL NUTRITION STATUS      Continent Diet (Regular)  AMBULATORY STATUS COMMUNICATION OF NEEDS Skin   Limited Assist Verbally Normal                       Personal Care Assistance Level of Assistance  Bathing, Feeding, Dressing Bathing Assistance: Limited assistance Feeding assistance: Limited assistance Dressing Assistance: Limited assistance     Functional Limitations Info  Sight, Hearing, Speech Sight Info: Adequate Hearing Info: Adequate Speech Info: Adequate    SPECIAL CARE FACTORS FREQUENCY  PT (By licensed PT), OT (By licensed OT)     PT Frequency: x5/week OT Frequency: x5/week            Contractures Contractures Info: Not present    Additional Factors Info  Code Status, Allergies Code Status Info: Full Allergies Info: Doxycycline           Current Medications (10/28/2023):  This is the current hospital active medication list Current Facility-Administered Medications  Medication Dose Route Frequency Provider Last Rate Last Admin   lidocaine (LIDODERM) 5 % 1 patch  1 patch Transdermal Q24H Keith, Kayla N, PA-C   1 patch at 10/27/23 2034   lip balm (CARMEX) ointment   Topical Once Lind Repine, MD       Current Outpatient Medications  Medication Sig  Dispense Refill   lidocaine (LIDODERM) 5 % Place 1 patch onto the skin daily. Remove & Discard patch within 12 hours or as directed by MD 30 patch 0   aspirin EC 325 MG tablet Take 325 mg by mouth daily.     ondansetron (ZOFRAN-ODT) 4 MG disintegrating tablet 4mg  ODT q4 hours prn nausea/vomit 20 tablet 0   triamcinolone cream (KENALOG) 0.1 % Apply 1 Application topically 2 (two) times daily. 453.6 g 1     Discharge Medications: Please see discharge summary for a list of discharge medications.  Relevant Imaging Results:  Relevant Lab Results:   Additional Information [id number]  Jabier Martens, LCSW

## 2023-10-29 ENCOUNTER — Other Ambulatory Visit: Payer: Self-pay

## 2023-10-29 ENCOUNTER — Encounter (HOSPITAL_COMMUNITY): Payer: Self-pay

## 2023-10-29 DIAGNOSIS — R0781 Pleurodynia: Secondary | ICD-10-CM | POA: Diagnosis not present

## 2023-10-29 NOTE — Progress Notes (Addendum)
 CSW presented bed offers to pt's son. Son wishes to review Medicare.gov before making a decision. TOC following.   Addend @ 12:12 PM Family accepted [facility] at [facility]. Notified Tracey. Will initiate auth.   Addend @ 12:33 PM Auth pending for [facility]. Per Doylene Genet, rep, patient can admit over the weekend if [facility] is received.

## 2023-10-29 NOTE — ED Notes (Signed)
 Two family members at bedside, (Ok per Humphrey since boarder and not psych)

## 2023-10-29 NOTE — ED Notes (Signed)
 Pt family at bedside

## 2023-10-30 DIAGNOSIS — R4182 Altered mental status, unspecified: Secondary | ICD-10-CM | POA: Diagnosis not present

## 2023-10-30 DIAGNOSIS — S20212A Contusion of left front wall of thorax, initial encounter: Secondary | ICD-10-CM | POA: Diagnosis not present

## 2023-10-30 DIAGNOSIS — M5459 Other low back pain: Secondary | ICD-10-CM | POA: Diagnosis not present

## 2023-10-30 DIAGNOSIS — Z9181 History of falling: Secondary | ICD-10-CM | POA: Diagnosis not present

## 2023-10-30 DIAGNOSIS — I10 Essential (primary) hypertension: Secondary | ICD-10-CM | POA: Diagnosis not present

## 2023-10-30 DIAGNOSIS — R5383 Other fatigue: Secondary | ICD-10-CM | POA: Diagnosis not present

## 2023-10-30 DIAGNOSIS — R509 Fever, unspecified: Secondary | ICD-10-CM | POA: Diagnosis not present

## 2023-10-30 DIAGNOSIS — Z743 Need for continuous supervision: Secondary | ICD-10-CM | POA: Diagnosis not present

## 2023-10-30 DIAGNOSIS — J449 Chronic obstructive pulmonary disease, unspecified: Secondary | ICD-10-CM | POA: Diagnosis not present

## 2023-10-30 DIAGNOSIS — W19XXXA Unspecified fall, initial encounter: Secondary | ICD-10-CM | POA: Diagnosis not present

## 2023-10-30 NOTE — ED Notes (Signed)
 Report given to Tammy, RN at [facility] [facility]. Pt only belongings are cell phone and chap stick. [facility] transporting.

## 2023-10-30 NOTE — ED Provider Notes (Signed)
 Emergency Medicine Observation Re-evaluation Note  Theresa Arnold is a 89 y.o. female, seen on rounds today.  Pt initially presented to the ED for complaints of Back Pain Currently, the patient is resting.  Physical Exam  BP (!) 152/57   Pulse (!) 57   Temp 98.1 F (36.7 C) (Oral)   Resp 16   SpO2 99%  Physical Exam General: NAD   ED Course / MDM  EKG:   I have reviewed the labs performed to date as well as medications administered while in observation.  Recent changes in the last 24 hours include no acute events reported.  Plan  Current plan is for placement - [facility] at [facility] - likely today/tomorrow.    Burnette Carte, MD 10/30/23 [phone]

## 2023-10-30 NOTE — Progress Notes (Signed)
 Mobility Specialist - Progress Note   10/30/23 1053  Mobility  Activity Ambulated with assistance in hallway  Level of Assistance Contact guard assist, steadying assist  Assistive Device Front wheel walker  Distance Ambulated (ft) 40 ft  Activity Response Tolerated well  Mobility Referral Yes  Mobility visit 1 Mobility  Mobility Specialist Start Time (ACUTE ONLY) 1031  Mobility Specialist Stop Time (ACUTE ONLY) 1051  Mobility Specialist Time Calculation (min) (ACUTE ONLY) 20 min   Pt received in bed and agreeable to mobility. Pt was modA from supine>sitting & minA from STS. CG during ambulation. Pt required verbal cues for walker mobility.  Pt was minA getting back into bed. Pt to bed after session with all needs met.    [facility]

## 2023-10-30 NOTE — Progress Notes (Signed)
 CSW spoke with April who confirmed pt can admit today. Report/room info given to RN via secure chat. EDP aware. [facility] called and will transport. Son, Laurell Pond, is aware. No additional TOC needs.

## 2023-10-30 NOTE — Progress Notes (Signed)
 Auth approved. CSW has outreached to [place] to inquire about admit time.

## 2023-10-31 DIAGNOSIS — I10 Essential (primary) hypertension: Secondary | ICD-10-CM | POA: Diagnosis not present

## 2023-10-31 DIAGNOSIS — W19XXXA Unspecified fall, initial encounter: Secondary | ICD-10-CM | POA: Diagnosis not present

## 2023-10-31 DIAGNOSIS — S20212A Contusion of left front wall of thorax, initial encounter: Secondary | ICD-10-CM | POA: Diagnosis not present

## 2023-10-31 DIAGNOSIS — Z9181 History of falling: Secondary | ICD-10-CM | POA: Diagnosis not present

## 2023-11-17 DIAGNOSIS — J449 Chronic obstructive pulmonary disease, unspecified: Secondary | ICD-10-CM | POA: Diagnosis not present

## 2023-11-19 DIAGNOSIS — N390 Urinary tract infection, site not specified: Secondary | ICD-10-CM | POA: Diagnosis not present

## 2023-11-20 DIAGNOSIS — I10 Essential (primary) hypertension: Secondary | ICD-10-CM | POA: Diagnosis not present

## 2023-11-20 DIAGNOSIS — J449 Chronic obstructive pulmonary disease, unspecified: Secondary | ICD-10-CM | POA: Diagnosis not present

## 2023-11-20 DIAGNOSIS — E785 Hyperlipidemia, unspecified: Secondary | ICD-10-CM | POA: Diagnosis not present

## 2023-11-20 DIAGNOSIS — M545 Low back pain, unspecified: Secondary | ICD-10-CM | POA: Diagnosis not present

## 2023-11-20 DIAGNOSIS — Z8719 Personal history of other diseases of the digestive system: Secondary | ICD-10-CM | POA: Diagnosis not present

## 2023-11-20 DIAGNOSIS — E559 Vitamin D deficiency, unspecified: Secondary | ICD-10-CM | POA: Diagnosis not present

## 2023-11-20 DIAGNOSIS — M5412 Radiculopathy, cervical region: Secondary | ICD-10-CM | POA: Diagnosis not present

## 2023-11-20 DIAGNOSIS — L219 Seborrheic dermatitis, unspecified: Secondary | ICD-10-CM | POA: Diagnosis not present

## 2023-11-20 DIAGNOSIS — S301XXD Contusion of abdominal wall, subsequent encounter: Secondary | ICD-10-CM | POA: Diagnosis not present

## 2023-11-22 ENCOUNTER — Telehealth: Payer: Self-pay

## 2023-11-22 ENCOUNTER — Telehealth: Payer: Self-pay | Admitting: Internal Medicine

## 2023-11-22 NOTE — Transitions of Care (Post Inpatient/ED Visit) (Signed)
   11/22/2023  Name: Theresa Arnold MRN: [id number] DOB: 1932-03-19  Today's TOC FU Call Status: Today's TOC FU Call Status:: Successful TOC FU Call Completed TOC FU Call Complete Date: 11/22/23 Patient's Name and Date of Birth confirmed.  Transition Care Management Follow-up Telephone Call Date of Discharge: 11/19/23 Discharge Facility: Other ([facility]) Name of Other ([facility]) Discharge Facility: [facility] Type of Discharge: Inpatient Admission Primary Inpatient Discharge Diagnosis:: diverticulitis How have you been since you were released from the hospital?: Better Any questions or concerns?: No  Items Reviewed: Did you receive and understand the discharge instructions provided?: Yes Medications obtained,verified, and reconciled?: Yes (Medications Reviewed) Any new allergies since your discharge?: No Dietary orders reviewed?: Yes Do you have support at home?: Yes People in Home [RPT]: child(ren), adult  Medications Reviewed Today: Medications Reviewed Today     Reviewed by Darrall Ellison, LPN (Licensed Practical Nurse) on 11/22/23 at 1227  Med List Status: <None>   Medication Order Taking? Sig Documenting Provider Last Dose Status Informant  aspirin EC 325 MG tablet [id number] No Take 325 mg by mouth daily. [provider] Taking Active   lidocaine  (LIDODERM ) 5 % [id number]  Place 1 patch onto the skin daily. Remove & Discard patch within 12 hours or as directed by MD Keith, Kayla N, PA-C  Active   ondansetron  (ZOFRAN -ODT) 4 MG disintegrating tablet [id number]  4mg  ODT q4 hours prn nausea/vomit Floyd, Dan, DO  Active   triamcinolone  cream (KENALOG ) 0.1 % [id number]  Apply 1 Application topically 2 (two) times daily. Roslyn Coombe, MD  Active             Home Care and Equipment/Supplies: Were Home Health Services Ordered?: Yes Name of Home Health Agency:: [facility] Has Agency set up a time to come to your home?: Yes First Home Health Visit Date:  11/24/23 Any new equipment or medical supplies ordered?: Yes Name of Medical supply agency?: unknown Were you able to get the equipment/medical supplies?: Yes Do you have any questions related to the use of the equipment/supplies?: No  Functional Questionnaire: Do you need assistance with bathing/showering or dressing?: Yes Do you need assistance with meal preparation?: Yes Do you need assistance with eating?: No Do you have difficulty maintaining continence: No Do you need assistance with getting out of bed/getting out of a chair/moving?: No Do you have difficulty managing or taking your medications?: No  Follow up appointments reviewed: PCP Follow-up appointment confirmed?: Yes Date of PCP follow-up appointment?: 12/03/23 Follow-up Provider: Autry Legions Do you need transportation to your follow-up appointment?: No Do you understand care options if your condition(s) worsen?: Yes-patient verbalized understanding    SIGNATURE Darrall Ellison, LPN [facility] Nurse Health Advisor Direct Dial [phone]

## 2023-11-22 NOTE — Telephone Encounter (Signed)
 Copied from CRM [id number]. Topic: Clinical - Home Health Verbal Orders >> Nov 22, 2023  8:50 AM Adonis Hoot wrote: Caller/Agency: [facility] Number: [phone] Service Requested: Physical Therapy Frequency: 1wk1 2wk3  1wk1 2wk 1 1wk3 Any new concerns about the patient? Yes Patient had a medication of potassium chloride  on her med list,caregiver will not give to patient due to being too big to swallow.

## 2023-11-22 NOTE — Telephone Encounter (Signed)
 Ok for [employer]

## 2023-11-23 DIAGNOSIS — S301XXD Contusion of abdominal wall, subsequent encounter: Secondary | ICD-10-CM | POA: Diagnosis not present

## 2023-11-23 DIAGNOSIS — M5412 Radiculopathy, cervical region: Secondary | ICD-10-CM | POA: Diagnosis not present

## 2023-11-23 DIAGNOSIS — J449 Chronic obstructive pulmonary disease, unspecified: Secondary | ICD-10-CM | POA: Diagnosis not present

## 2023-11-23 DIAGNOSIS — E785 Hyperlipidemia, unspecified: Secondary | ICD-10-CM | POA: Diagnosis not present

## 2023-11-23 DIAGNOSIS — E559 Vitamin D deficiency, unspecified: Secondary | ICD-10-CM | POA: Diagnosis not present

## 2023-11-23 DIAGNOSIS — M545 Low back pain, unspecified: Secondary | ICD-10-CM | POA: Diagnosis not present

## 2023-11-23 DIAGNOSIS — Z8719 Personal history of other diseases of the digestive system: Secondary | ICD-10-CM | POA: Diagnosis not present

## 2023-11-23 DIAGNOSIS — L219 Seborrheic dermatitis, unspecified: Secondary | ICD-10-CM | POA: Diagnosis not present

## 2023-11-23 DIAGNOSIS — I10 Essential (primary) hypertension: Secondary | ICD-10-CM | POA: Diagnosis not present

## 2023-11-23 NOTE — Telephone Encounter (Signed)
 Called and gave verbals.

## 2023-11-25 DIAGNOSIS — Z8719 Personal history of other diseases of the digestive system: Secondary | ICD-10-CM | POA: Diagnosis not present

## 2023-11-25 DIAGNOSIS — J449 Chronic obstructive pulmonary disease, unspecified: Secondary | ICD-10-CM | POA: Diagnosis not present

## 2023-11-25 DIAGNOSIS — M545 Low back pain, unspecified: Secondary | ICD-10-CM | POA: Diagnosis not present

## 2023-11-25 DIAGNOSIS — L219 Seborrheic dermatitis, unspecified: Secondary | ICD-10-CM | POA: Diagnosis not present

## 2023-11-25 DIAGNOSIS — S301XXD Contusion of abdominal wall, subsequent encounter: Secondary | ICD-10-CM | POA: Diagnosis not present

## 2023-11-25 DIAGNOSIS — I10 Essential (primary) hypertension: Secondary | ICD-10-CM | POA: Diagnosis not present

## 2023-11-25 DIAGNOSIS — M5412 Radiculopathy, cervical region: Secondary | ICD-10-CM | POA: Diagnosis not present

## 2023-11-25 DIAGNOSIS — E785 Hyperlipidemia, unspecified: Secondary | ICD-10-CM | POA: Diagnosis not present

## 2023-11-25 DIAGNOSIS — E559 Vitamin D deficiency, unspecified: Secondary | ICD-10-CM | POA: Diagnosis not present

## 2023-11-26 ENCOUNTER — Encounter (HOSPITAL_COMMUNITY): Payer: Self-pay | Admitting: *Deleted

## 2023-11-26 ENCOUNTER — Emergency Department (HOSPITAL_COMMUNITY)

## 2023-11-26 ENCOUNTER — Emergency Department (HOSPITAL_COMMUNITY)
Admission: EM | Admit: 2023-11-26 | Discharge: 2023-11-26 | Disposition: A | Discharge status: Home / Self Care | Attending: Emergency Medicine | Admitting: Emergency Medicine

## 2023-11-26 ENCOUNTER — Other Ambulatory Visit: Payer: Self-pay

## 2023-11-26 DIAGNOSIS — M7989 Other specified soft tissue disorders: Secondary | ICD-10-CM | POA: Insufficient documentation

## 2023-11-26 DIAGNOSIS — I771 Stricture of artery: Secondary | ICD-10-CM | POA: Diagnosis not present

## 2023-11-26 DIAGNOSIS — Z7982 Long term (current) use of aspirin: Secondary | ICD-10-CM | POA: Insufficient documentation

## 2023-11-26 DIAGNOSIS — R600 Localized edema: Secondary | ICD-10-CM | POA: Diagnosis not present

## 2023-11-26 DIAGNOSIS — R609 Edema, unspecified: Secondary | ICD-10-CM | POA: Diagnosis not present

## 2023-11-26 DIAGNOSIS — R531 Weakness: Secondary | ICD-10-CM | POA: Diagnosis not present

## 2023-11-26 LAB — COMPREHENSIVE METABOLIC PANEL WITH GFR
ALT: 7 U/L (ref 0–44)
AST: 21 U/L (ref 15–41)
Albumin: 3.8 g/dL (ref 3.5–5.0)
Alkaline Phosphatase: 62 U/L (ref 38–126)
Anion gap: 10 (ref 5–15)
BUN: 13 mg/dL (ref 8–23)
CO2: 25 mmol/L (ref 22–32)
Calcium: 9 mg/dL (ref 8.9–10.3)
Chloride: 101 mmol/L (ref 98–111)
Creatinine, Ser: 0.59 mg/dL (ref 0.44–1.00)
GFR, Estimated: >60 mL/min (ref >60)
Glucose, Bld: 96 mg/dL (ref 70–99)
Potassium: 3.9 mmol/L (ref 3.5–5.1)
Sodium: 136 mmol/L (ref 135–145)
Total Bilirubin: 0.7 mg/dL (ref 0.0–1.2)
Total Protein: 7.9 g/dL (ref 6.5–8.1)

## 2023-11-26 LAB — CBC WITH DIFFERENTIAL/PLATELET
Abs Immature Granulocytes: 0.02 10*3/uL (ref 0.00–0.07)
Basophils Absolute: 0 10*3/uL (ref 0.0–0.1)
Basophils Relative: 1 %
Eosinophils Absolute: 0.1 10*3/uL (ref 0.0–0.5)
Eosinophils Relative: 1 %
HCT: 38.2 % (ref 36.0–46.0)
Hemoglobin: 12.3 g/dL (ref 12.0–15.0)
Immature Granulocytes: 0 %
Lymphocytes Relative: 32 %
Lymphs Abs: 1.4 10*3/uL (ref 0.7–4.0)
MCH: 33.3 pg (ref 26.0–34.0)
MCHC: 32.2 g/dL (ref 30.0–36.0)
MCV: 103.5 fL — ABNORMAL HIGH (ref 80.0–100.0)
Monocytes Absolute: 0.3 10*3/uL (ref 0.1–1.0)
Monocytes Relative: 6 %
Neutro Abs: 2.7 10*3/uL (ref 1.7–7.7)
Neutrophils Relative %: 60 %
Platelets: 172 10*3/uL (ref 150–400)
RBC: 3.69 MIL/uL — ABNORMAL LOW (ref 3.87–5.11)
RDW: 13.6 % (ref 11.5–15.5)
WBC: 4.5 10*3/uL (ref 4.0–10.5)
nRBC: 0 % (ref 0.0–0.2)

## 2023-11-26 LAB — URINALYSIS, W/ REFLEX TO CULTURE (INFECTION SUSPECTED)
Bilirubin Urine: NEGATIVE
Glucose, UA: NEGATIVE mg/dL
Hgb urine dipstick: NEGATIVE
Ketones, ur: NEGATIVE mg/dL
Leukocytes,Ua: NEGATIVE
Nitrite: NEGATIVE
Protein, ur: NEGATIVE mg/dL
Specific Gravity, Urine: 1.012 (ref 1.005–1.030)
pH: 6 (ref 5.0–8.0)

## 2023-11-26 LAB — TROPONIN I (HIGH SENSITIVITY)
Troponin I (High Sensitivity): 4 ng/L (ref <18)
Troponin I (High Sensitivity): 4 ng/L (ref <18)

## 2023-11-26 LAB — BRAIN NATRIURETIC PEPTIDE: B Natriuretic Peptide: 52.5 pg/mL (ref 0.0–100.0)

## 2023-11-26 NOTE — ED Provider Notes (Signed)
 [place] EMERGENCY DEPARTMENT AT [facility] Provider Note   CSN: [id number] Arrival date & time: 11/26/23  5621     History  Chief Complaint  Patient presents with   Leg Swelling    Theresa Arnold is a 89 y.o. female.  89 year old female with prior medical history as detailed below presents for evaluation.  Patient complains of bilateral lower extremity edema.  This has been a problem for the last 2 to 3 days.  Patient without reported prior history of same complaint.  Patient denies fever or chest pain.  She denies shortness of breath.  Patient reports that her legs are "uncomfortable."  Her family is present at bedside.  Patient is in normal state of health with the exception of her bilateral lower extremity edema/pain.  Patient reports gaining of several pounds (3-5) over the last 2 to 3 days.  Patient does not currently use diuretics.  The history is provided by the patient.       Home Medications Prior to Admission medications   Medication Sig Start Date End Date Taking? Authorizing Provider  aspirin EC 325 MG tablet Take 325 mg by mouth daily.    [provider]  lidocaine  (LIDODERM ) 5 % Place 1 patch onto the skin daily. Remove & Discard patch within 12 hours or as directed by MD 10/27/23   Keith, Kayla N, PA-C  ondansetron  (ZOFRAN -ODT) 4 MG disintegrating tablet 4mg  ODT q4 hours prn nausea/vomit 09/10/23   Floyd, Dan, DO  triamcinolone  cream (KENALOG ) 0.1 % Apply 1 Application topically 2 (two) times daily. 08/17/23 08/16/24  Roslyn Coombe, MD      Allergies    Doxycycline     Review of Systems   Review of Systems  All other systems reviewed and are negative.   Physical Exam Updated Vital Signs BP (!) 133/113 (BP Location: Right Arm)   Pulse 64   Temp 98 F (36.7 C) (Oral)   Resp 18   Ht 5' (1.524 m)   Wt 61.2 kg   SpO2 100%   BMI 26.37 kg/m  Physical Exam Vitals and nursing note reviewed.  Constitutional:      General: She is not in  acute distress.    Appearance: Normal appearance. She is well-developed.  HENT:     Head: Normocephalic and atraumatic.  Eyes:     Conjunctiva/sclera: Conjunctivae normal.     Pupils: Pupils are equal, round, and reactive to light.  Cardiovascular:     Rate and Rhythm: Normal rate and regular rhythm.     Heart sounds: Normal heart sounds.  Pulmonary:     Effort: Pulmonary effort is normal. No respiratory distress.     Breath sounds: Normal breath sounds.  Abdominal:     General: There is no distension.     Palpations: Abdomen is soft.     Tenderness: There is no abdominal tenderness.  Musculoskeletal:        General: No deformity. Normal range of motion.     Cervical back: Normal range of motion and neck supple.     Right lower leg: Edema present.     Left lower leg: Edema present.     Comments: Mild 1-2+ pitting edema for the dorsal aspects of the bilateral feet and ankles.  Skin:    General: Skin is warm and dry.  Neurological:     General: No focal deficit present.     Mental Status: She is alert and oriented to person, place, and time.  ED Results / Procedures / Treatments   Labs (all labs ordered are listed, but only abnormal results are displayed) Labs Reviewed  COMPREHENSIVE METABOLIC PANEL WITH GFR  CBC WITH DIFFERENTIAL/PLATELET  URINALYSIS, W/ REFLEX TO CULTURE (INFECTION SUSPECTED)    EKG None  Radiology No results found.  Procedures Procedures    Medications Ordered in ED Medications - No data to display  ED Course/ Medical Decision Making/ A&P                                 Medical Decision Making Amount and/or Complexity of Data Reviewed Labs: ordered. Radiology: ordered.    Medical Screen Complete  This patient presented to the ED with complaint of LE edema.  This complaint involves an extensive number of treatment options. The initial differential diagnosis includes, but is not limited to, DVT, metabolic abnormality, AKI,  arrhythmia, etc.  This presentation is: Acute, Self-Limited, Previously Undiagnosed, Uncertain Prognosis, Complicated, Systemic Symptoms, and Threat to Life/Bodily Function  Patient complains of bilateral lower extremity edema.  Obtained labs and imaging are without significant abnormality.    Patient and patient's family reassured by workup findings.  Outpatient management of the patient's symptoms is discussed extensively.  Patient understands need for close outpatient follow-up.  Strict return precautions given and understood.  Additional history obtained:  External records from outside sources obtained and reviewed including prior ED visits and prior Inpatient records.   Problem List / ED Course:  BLE Edema   Disposition:  After consideration of the diagnostic results and the patients response to treatment, I feel that the patent would benefit from close outpatient followup.          Final Clinical Impression(s) / ED Diagnoses Final diagnoses:  Leg edema    Rx / DC Orders ED Discharge Orders     None         Burnette Carte, MD 11/26/23 1452

## 2023-11-26 NOTE — ED Triage Notes (Signed)
 BIB family from home for increased BLE swelling, weight gain, leg discomfort, and inability to walk (d/t same). Rates leg discomfort 3/10. No h/o same. Denies diuretic use. Alert, NAD, calm, interactive, resps e/u, speaking in clear complete sentences. Denies sob or CP.

## 2023-11-26 NOTE — Discharge Instructions (Signed)
 Return for any problem.  ?

## 2023-11-26 NOTE — ED Notes (Signed)
Vascular tech @ bedside

## 2023-11-26 NOTE — Progress Notes (Signed)
 VASCULAR LAB    Bilateral lower extremity venous duplex has been performed.  See CV proc for preliminary results.  Relayed negative results to Dr. Reba Camper via secure chat   Lanette Pipe, [facility], RVT 11/26/2023, 2:10 PM

## 2023-11-30 DIAGNOSIS — M545 Low back pain, unspecified: Secondary | ICD-10-CM | POA: Diagnosis not present

## 2023-11-30 DIAGNOSIS — Z8719 Personal history of other diseases of the digestive system: Secondary | ICD-10-CM | POA: Diagnosis not present

## 2023-11-30 DIAGNOSIS — E785 Hyperlipidemia, unspecified: Secondary | ICD-10-CM | POA: Diagnosis not present

## 2023-11-30 DIAGNOSIS — L219 Seborrheic dermatitis, unspecified: Secondary | ICD-10-CM | POA: Diagnosis not present

## 2023-11-30 DIAGNOSIS — E559 Vitamin D deficiency, unspecified: Secondary | ICD-10-CM | POA: Diagnosis not present

## 2023-11-30 DIAGNOSIS — S301XXD Contusion of abdominal wall, subsequent encounter: Secondary | ICD-10-CM | POA: Diagnosis not present

## 2023-11-30 DIAGNOSIS — I10 Essential (primary) hypertension: Secondary | ICD-10-CM | POA: Diagnosis not present

## 2023-11-30 DIAGNOSIS — M5412 Radiculopathy, cervical region: Secondary | ICD-10-CM | POA: Diagnosis not present

## 2023-11-30 DIAGNOSIS — J449 Chronic obstructive pulmonary disease, unspecified: Secondary | ICD-10-CM | POA: Diagnosis not present

## 2023-12-02 DIAGNOSIS — E559 Vitamin D deficiency, unspecified: Secondary | ICD-10-CM | POA: Diagnosis not present

## 2023-12-02 DIAGNOSIS — Z8719 Personal history of other diseases of the digestive system: Secondary | ICD-10-CM | POA: Diagnosis not present

## 2023-12-02 DIAGNOSIS — J449 Chronic obstructive pulmonary disease, unspecified: Secondary | ICD-10-CM | POA: Diagnosis not present

## 2023-12-02 DIAGNOSIS — M545 Low back pain, unspecified: Secondary | ICD-10-CM | POA: Diagnosis not present

## 2023-12-02 DIAGNOSIS — M5412 Radiculopathy, cervical region: Secondary | ICD-10-CM | POA: Diagnosis not present

## 2023-12-02 DIAGNOSIS — S301XXD Contusion of abdominal wall, subsequent encounter: Secondary | ICD-10-CM | POA: Diagnosis not present

## 2023-12-02 DIAGNOSIS — L219 Seborrheic dermatitis, unspecified: Secondary | ICD-10-CM | POA: Diagnosis not present

## 2023-12-02 DIAGNOSIS — E785 Hyperlipidemia, unspecified: Secondary | ICD-10-CM | POA: Diagnosis not present

## 2023-12-02 DIAGNOSIS — I10 Essential (primary) hypertension: Secondary | ICD-10-CM | POA: Diagnosis not present

## 2023-12-03 ENCOUNTER — Encounter: Payer: Self-pay | Admitting: Internal Medicine

## 2023-12-03 ENCOUNTER — Ambulatory Visit (INDEPENDENT_AMBULATORY_CARE_PROVIDER_SITE_OTHER): Admitting: Internal Medicine

## 2023-12-03 VITALS — BP 120/74 | HR 70 | Temp 99.1°F | Ht 60.0 in | Wt 115.0 lb

## 2023-12-03 DIAGNOSIS — F5101 Primary insomnia: Secondary | ICD-10-CM | POA: Diagnosis not present

## 2023-12-03 DIAGNOSIS — R634 Abnormal weight loss: Secondary | ICD-10-CM | POA: Diagnosis not present

## 2023-12-03 DIAGNOSIS — I10 Essential (primary) hypertension: Secondary | ICD-10-CM | POA: Diagnosis not present

## 2023-12-03 DIAGNOSIS — E559 Vitamin D deficiency, unspecified: Secondary | ICD-10-CM

## 2023-12-03 DIAGNOSIS — I872 Venous insufficiency (chronic) (peripheral): Secondary | ICD-10-CM

## 2023-12-03 DIAGNOSIS — R739 Hyperglycemia, unspecified: Secondary | ICD-10-CM | POA: Diagnosis not present

## 2023-12-03 MED ORDER — HYDROCHLOROTHIAZIDE 12.5 MG PO CAPS: 12.5000 mg | ORAL_CAPSULE | Freq: Every day | ORAL | 3 refills | Status: DC | PRN

## 2023-12-03 MED ORDER — QUETIAPINE FUMARATE 50 MG PO TABS: 50.0000 mg | ORAL_TABLET | Freq: Every day | ORAL | 1 refills | Status: DC

## 2023-12-03 NOTE — Progress Notes (Unsigned)
 Patient ID: Theresa Arnold, female   DOB: [birth year], 89 y.o.   MRN: [id number]        Chief Complaint: follow up peripheral edema, insomnia with confusion, weight loss, , low vit d, htn, hyperglycemia       HPI:  Theresa Arnold is a 89 y.o. female here with c/o worsening ankle edema with discomfort, really bothers her.  Pt denies chest pain, increased sob or doe, wheezing, orthopnea, PND,  palpitations, dizziness or syncope. Has worsening over 1 mo insomnia with confusion and behavioral issues.  Has lost signficant wt loss, but son states Lost wt with better diet and is not concerned.   Pt denies polydipsia, polyuria, or new focal neuro s/s.       Wt Readings from Last 3 Encounters:  12/03/23 115 lb (52.2 kg)  11/26/23 135 lb (61.2 kg)  09/10/23 136 lb 11 oz (62 kg)   BP Readings from Last 3 Encounters:  12/03/23 120/74  11/26/23 (!) 133/113  10/30/23 (!) 143/68         Past Medical History:  Diagnosis Date   CERVICAL RADICULOPATHY, LEFT 09/03/2010   Cervical spondylosis 11/10/2011   COPD (chronic obstructive pulmonary disease) (HCC) 11/10/2011   Diverticulitis    HTN (hypertension) 06/13/2021   HYPERLIPIDEMIA 09/03/2010   Sigmoid stricture (HCC)    Past Surgical History:  Procedure Laterality Date   COLONOSCOPY     COLOSTOMY REVERSAL N/A 09/04/2019   Procedure: PRIMARY COLOCOLOSTOMY REVERSAL AND TAKEDOWN OF HARTMAN;  Surgeon: Jacolyn Matar, MD;  Location: [facility];  Service: General;  Laterality: N/A;   EYE SURGERY     bilateral cataracts with lens implants   LAPAROSCOPIC PARTIAL COLECTOMY N/A 03/22/2019   Procedure: LAPAROSCOPY, SPLENIC FLEXURE MOBILIZATION WITH OPEN HARTMANN PROCEDURE;  Surgeon: Jacolyn Matar, MD;  Location: [facility];  Service: General;  Laterality: N/A;   LAPAROTOMY N/A 07/22/2020   Procedure: EXPLORATORY LAPAROTOMY lysis of adhesions;  Surgeon: Oza Blumenthal, MD;  Location: [facility];  Service: General;  Laterality: N/A;    reports that she quit smoking about  12 years ago. Her smoking use included cigarettes. She started smoking about 42 years ago. She has a 30 pack-year smoking history. She has never been exposed to tobacco smoke. She has never used smokeless tobacco. She reports current alcohol use. She reports that she does not use drugs. family history includes Breast cancer in her sister; Colon cancer in her sister; Heart disease in her father; Hip fracture in her mother; Hypertension in an other family member. Allergies  Allergen Reactions   Doxycycline  Nausea And Vomiting   Current Outpatient Medications on File Prior to Visit  Medication Sig Dispense Refill   aspirin EC 325 MG tablet Take 325 mg by mouth daily.     desonide (DESOWEN) 0.05 % cream 1 application to affected area Externally Twice a day; Duration: 30 Days     lidocaine  (LIDODERM ) 5 % Place 1 patch onto the skin daily. Remove & Discard patch within 12 hours or as directed by MD 30 patch 0   ondansetron  (ZOFRAN -ODT) 4 MG disintegrating tablet 4mg  ODT q4 hours prn nausea/vomit 20 tablet 0   triamcinolone  cream (KENALOG ) 0.1 % Apply 1 Application topically 2 (two) times daily. 453.6 g 1   No current facility-administered medications on file prior to visit.        ROS:  All others reviewed and negative.  Objective        PE:  BP 120/74 (BP Location: Right  Arm, Patient Position: Sitting, Cuff Size: Normal)   Pulse 70   Temp 99.1 F (37.3 C) (Oral)   Ht 5' (1.524 m)   Wt 115 lb (52.2 kg)   SpO2 97%   BMI 22.46 kg/m                 Constitutional: Pt appears in NAD               HENT: Head: NCAT.                Right Ear: External ear normal.                 Left Ear: External ear normal.                Eyes: . Pupils are equal, round, and reactive to light. Conjunctivae and EOM are normal               Nose: without d/c or deformity               Neck: Neck supple. Gross normal ROM               Cardiovascular: Normal rate and regular rhythm.                  Pulmonary/Chest: Effort normal and breath sounds without rales or wheezing.                Abd:  Soft, NT, ND, + BS, no organomegaly               Neurological: Pt is alert. At baseline orientation, motor grossly intact               Skin: Skin is warm. No rashes, no other new lesions, LE edema - bilateral 1+ ankle edema               Psychiatric: Pt behavior is normal without agitation   Micro: none  Cardiac tracings I have personally interpreted today:  none  Pertinent Radiological findings (summarize): none   Lab Results  Component Value Date   WBC 4.5 11/26/2023   HGB 12.3 11/26/2023   HCT 38.2 11/26/2023   PLT 172 11/26/2023   GLUCOSE 96 11/26/2023   CHOL 210 (H) 08/17/2023   TRIG 81.0 08/17/2023   HDL 62.60 08/17/2023   LDLDIRECT 142.7 11/10/2011   LDLCALC 131 (H) 08/17/2023   ALT 7 11/26/2023   AST 21 11/26/2023   NA 136 11/26/2023   K 3.9 11/26/2023   CL 101 11/26/2023   CREATININE 0.59 11/26/2023   BUN 13 11/26/2023   CO2 25 11/26/2023   TSH 1.09 08/17/2023   INR 0.97 04/18/2018   HGBA1C 5.3 08/17/2023   Assessment/Plan:  Theresa Arnold is a 89 y.o. Black or African American [2] female with  has a past medical history of CERVICAL RADICULOPATHY, LEFT (09/03/2010), Cervical spondylosis (11/10/2011), COPD (chronic obstructive pulmonary disease) (HCC) (11/10/2011), Diverticulitis, HTN (hypertension) (06/13/2021), HYPERLIPIDEMIA (09/03/2010), and Sigmoid stricture (HCC).  Hyperglycemia Lab Results  Component Value Date   HGBA1C 5.3 08/17/2023   Stable, pt to continue current medical treatment  - diet, wt control   HTN (hypertension) BP Readings from Last 3 Encounters:  12/03/23 120/74  11/26/23 (!) 133/113  10/30/23 (!) 143/68   Stable, pt to continue medical treatment  - diet wt control   Vitamin D  deficiency Last vitamin D  Lab Results  Component Value Date   VD25OH 8.95 (L)  08/17/2023   Low, to start oral replacement   Insomnia With confusion at  ngiht and behaviors - for seroquel 50 mg qhs  Weight loss 20 lbs reduced by wt today, son states was intentional, and her appetite is good  Venous (peripheral) insufficiency Pt miserable with this apparently , unable for compression stockings, already following low salt diet and leg elevation difficult at home, now for hct 12.5 every day prn  Followup: Return in about 2 months (around 02/14/2024).  Rosalia Colonel, MD 12/05/2023 2:28 PM [facility]

## 2023-12-03 NOTE — Patient Instructions (Signed)
 Please take all new medication as prescribed - the mild fluid pill only as needed for leg swelling, and the seroquel 50 mg at bedtime  Please continue all other medications as before, and refills have been done if requested.  Please have the pharmacy call with any other refills you may need.  Please keep your appointments with your specialists as you may have planned

## 2023-12-05 ENCOUNTER — Encounter: Payer: Self-pay | Admitting: Internal Medicine

## 2023-12-05 DIAGNOSIS — G4700 Insomnia, unspecified: Secondary | ICD-10-CM | POA: Insufficient documentation

## 2023-12-05 DIAGNOSIS — R634 Abnormal weight loss: Secondary | ICD-10-CM | POA: Insufficient documentation

## 2023-12-05 NOTE — Assessment & Plan Note (Signed)
 With confusion at ngiht and behaviors - for seroquel 50 mg qhs

## 2023-12-05 NOTE — Assessment & Plan Note (Signed)
 Lab Results  Component Value Date   HGBA1C 5.3 08/17/2023   Stable, pt to continue current medical treatment  - diet, wt control

## 2023-12-05 NOTE — Assessment & Plan Note (Signed)
 BP Readings from Last 3 Encounters:  12/03/23 120/74  11/26/23 (!) 133/113  10/30/23 (!) 143/68   Stable, pt to continue medical treatment  - diet wt control

## 2023-12-05 NOTE — Assessment & Plan Note (Signed)
 20 lbs reduced by wt today, son states was intentional, and her appetite is good

## 2023-12-05 NOTE — Assessment & Plan Note (Signed)
 Last vitamin D  Lab Results  Component Value Date   VD25OH 8.95 (L) 08/17/2023   Low, to start oral replacement

## 2023-12-05 NOTE — Assessment & Plan Note (Signed)
 Pt miserable with this apparently , unable for compression stockings, already following low salt diet and leg elevation difficult at home, now for hct 12.5 every day prn

## 2023-12-07 DIAGNOSIS — E559 Vitamin D deficiency, unspecified: Secondary | ICD-10-CM | POA: Diagnosis not present

## 2023-12-07 DIAGNOSIS — E785 Hyperlipidemia, unspecified: Secondary | ICD-10-CM | POA: Diagnosis not present

## 2023-12-07 DIAGNOSIS — L219 Seborrheic dermatitis, unspecified: Secondary | ICD-10-CM | POA: Diagnosis not present

## 2023-12-07 DIAGNOSIS — J449 Chronic obstructive pulmonary disease, unspecified: Secondary | ICD-10-CM | POA: Diagnosis not present

## 2023-12-07 DIAGNOSIS — M545 Low back pain, unspecified: Secondary | ICD-10-CM | POA: Diagnosis not present

## 2023-12-07 DIAGNOSIS — M5412 Radiculopathy, cervical region: Secondary | ICD-10-CM | POA: Diagnosis not present

## 2023-12-07 DIAGNOSIS — Z8719 Personal history of other diseases of the digestive system: Secondary | ICD-10-CM | POA: Diagnosis not present

## 2023-12-07 DIAGNOSIS — S301XXD Contusion of abdominal wall, subsequent encounter: Secondary | ICD-10-CM | POA: Diagnosis not present

## 2023-12-07 DIAGNOSIS — I10 Essential (primary) hypertension: Secondary | ICD-10-CM | POA: Diagnosis not present

## 2023-12-10 DIAGNOSIS — Z8719 Personal history of other diseases of the digestive system: Secondary | ICD-10-CM | POA: Diagnosis not present

## 2023-12-10 DIAGNOSIS — I10 Essential (primary) hypertension: Secondary | ICD-10-CM | POA: Diagnosis not present

## 2023-12-10 DIAGNOSIS — E785 Hyperlipidemia, unspecified: Secondary | ICD-10-CM | POA: Diagnosis not present

## 2023-12-10 DIAGNOSIS — M5412 Radiculopathy, cervical region: Secondary | ICD-10-CM | POA: Diagnosis not present

## 2023-12-10 DIAGNOSIS — L219 Seborrheic dermatitis, unspecified: Secondary | ICD-10-CM | POA: Diagnosis not present

## 2023-12-10 DIAGNOSIS — S301XXD Contusion of abdominal wall, subsequent encounter: Secondary | ICD-10-CM | POA: Diagnosis not present

## 2023-12-10 DIAGNOSIS — M545 Low back pain, unspecified: Secondary | ICD-10-CM | POA: Diagnosis not present

## 2023-12-10 DIAGNOSIS — J449 Chronic obstructive pulmonary disease, unspecified: Secondary | ICD-10-CM | POA: Diagnosis not present

## 2023-12-10 DIAGNOSIS — E559 Vitamin D deficiency, unspecified: Secondary | ICD-10-CM | POA: Diagnosis not present

## 2023-12-14 DIAGNOSIS — E785 Hyperlipidemia, unspecified: Secondary | ICD-10-CM | POA: Diagnosis not present

## 2023-12-14 DIAGNOSIS — M545 Low back pain, unspecified: Secondary | ICD-10-CM | POA: Diagnosis not present

## 2023-12-14 DIAGNOSIS — L219 Seborrheic dermatitis, unspecified: Secondary | ICD-10-CM | POA: Diagnosis not present

## 2023-12-14 DIAGNOSIS — E559 Vitamin D deficiency, unspecified: Secondary | ICD-10-CM | POA: Diagnosis not present

## 2023-12-14 DIAGNOSIS — S301XXD Contusion of abdominal wall, subsequent encounter: Secondary | ICD-10-CM | POA: Diagnosis not present

## 2023-12-14 DIAGNOSIS — M5412 Radiculopathy, cervical region: Secondary | ICD-10-CM | POA: Diagnosis not present

## 2023-12-14 DIAGNOSIS — J449 Chronic obstructive pulmonary disease, unspecified: Secondary | ICD-10-CM | POA: Diagnosis not present

## 2023-12-14 DIAGNOSIS — I10 Essential (primary) hypertension: Secondary | ICD-10-CM | POA: Diagnosis not present

## 2023-12-14 DIAGNOSIS — Z8719 Personal history of other diseases of the digestive system: Secondary | ICD-10-CM | POA: Diagnosis not present

## 2023-12-18 DIAGNOSIS — J449 Chronic obstructive pulmonary disease, unspecified: Secondary | ICD-10-CM | POA: Diagnosis not present

## 2023-12-21 ENCOUNTER — Ambulatory Visit: Payer: Self-pay

## 2023-12-21 ENCOUNTER — Telehealth: Payer: Self-pay | Admitting: Internal Medicine

## 2023-12-21 DIAGNOSIS — J449 Chronic obstructive pulmonary disease, unspecified: Secondary | ICD-10-CM | POA: Diagnosis not present

## 2023-12-21 DIAGNOSIS — E559 Vitamin D deficiency, unspecified: Secondary | ICD-10-CM | POA: Diagnosis not present

## 2023-12-21 DIAGNOSIS — M5412 Radiculopathy, cervical region: Secondary | ICD-10-CM | POA: Diagnosis not present

## 2023-12-21 DIAGNOSIS — M545 Low back pain, unspecified: Secondary | ICD-10-CM | POA: Diagnosis not present

## 2023-12-21 DIAGNOSIS — Z8719 Personal history of other diseases of the digestive system: Secondary | ICD-10-CM | POA: Diagnosis not present

## 2023-12-21 DIAGNOSIS — L219 Seborrheic dermatitis, unspecified: Secondary | ICD-10-CM | POA: Diagnosis not present

## 2023-12-21 DIAGNOSIS — S301XXD Contusion of abdominal wall, subsequent encounter: Secondary | ICD-10-CM | POA: Diagnosis not present

## 2023-12-21 DIAGNOSIS — E785 Hyperlipidemia, unspecified: Secondary | ICD-10-CM | POA: Diagnosis not present

## 2023-12-21 DIAGNOSIS — I10 Essential (primary) hypertension: Secondary | ICD-10-CM | POA: Diagnosis not present

## 2023-12-21 NOTE — Telephone Encounter (Signed)
 Copied from CRM [id number]. Topic: Clinical - Red Word Triage >> Dec 21, 2023  4:34 PM Tiffany S wrote: Red Word that prompted transfer to Nurse Triage: UTI altered mental status confusion   Reason for Disposition  [1] Acting confused (e.g., disoriented, slurred speech) AND [2] brief (now gone)    Appointment tomorrow, advised to go to ED for worsening symptoms or if patient develops a fever  Answer Assessment - Initial Assessment Questions 1. LEVEL OF CONSCIOUSNESS: "How is he (she, the patient) acting right now?" (e.g., alert-oriented, confused, lethargic, stuporous, comatose)     Confused  2. ONSET: "When did the confusion start?"  (minutes, hours, days)     2-3 days ago 3. PATTERN "Does this come and go, or has it been constant since it started?"  "Is it present now?"     Intermittent  4. ALCOHOL or DRUGS: "Has he been drinking alcohol or taking any drugs?"      No 5. NARCOTIC MEDICINES: "Has he been receiving any narcotic medications?" (e.g., morphine , Vicodin)     No 6. CAUSE: "What do you think is causing the confusion?"      Possibly due to a UTI 7. OTHER SYMPTOMS: "Are there any other symptoms?" (e.g., difficulty breathing, headache, fever, weakness)     Decreased urinary output, ankle swelling  Protocols used: Confusion - Delirium-A-AH   FYI Only or Action Required?: FYI only for provider  Patient was last seen in primary care on 12/03/2023 by Roslyn Coombe, MD. Called Nurse Triage reporting increased confusion. Symptoms began 2-3 days ago. Interventions attempted: Nothing. Symptoms are: unchanged.  Triage Disposition: See HCP Within 4 Hours (Or PCP Triage)  Appointment tomorrow, advised to go to the ED for worsening symptoms or if patient develops a fever.   Patient/caregiver understands and will follow disposition?: Yes

## 2023-12-21 NOTE — Telephone Encounter (Signed)
 Copied from CRM [id number]. Topic: Clinical - Medical Advice >> Dec 21, 2023  1:05 PM Shereese L wrote: Reason for CRM: sreejesh from [facility] stated that patient Gained body weight 121.8 Gained 2lbs in 1 days and wanted the doctor to be aware

## 2023-12-22 ENCOUNTER — Encounter: Payer: Self-pay | Admitting: Emergency Medicine

## 2023-12-22 ENCOUNTER — Telehealth: Payer: Self-pay

## 2023-12-22 ENCOUNTER — Ambulatory Visit: Payer: Self-pay | Admitting: Internal Medicine

## 2023-12-22 ENCOUNTER — Ambulatory Visit (INDEPENDENT_AMBULATORY_CARE_PROVIDER_SITE_OTHER): Admitting: Emergency Medicine

## 2023-12-22 VITALS — BP 124/74 | HR 70 | Temp 97.8°F | Ht 60.0 in | Wt 120.8 lb

## 2023-12-22 DIAGNOSIS — R34 Anuria and oliguria: Secondary | ICD-10-CM | POA: Diagnosis not present

## 2023-12-22 DIAGNOSIS — J449 Chronic obstructive pulmonary disease, unspecified: Secondary | ICD-10-CM

## 2023-12-22 DIAGNOSIS — R54 Age-related physical debility: Secondary | ICD-10-CM | POA: Diagnosis not present

## 2023-12-22 DIAGNOSIS — I10 Essential (primary) hypertension: Secondary | ICD-10-CM

## 2023-12-22 DIAGNOSIS — R531 Weakness: Secondary | ICD-10-CM

## 2023-12-22 LAB — COMPREHENSIVE METABOLIC PANEL WITH GFR
ALT: 5 U/L (ref 0–35)
AST: 19 U/L (ref 0–37)
Albumin: 3.8 g/dL (ref 3.5–5.2)
Alkaline Phosphatase: 55 U/L (ref 39–117)
BUN: 21 mg/dL (ref 6–23)
CO2: 30 meq/L (ref 19–32)
Calcium: 9.7 mg/dL (ref 8.4–10.5)
Chloride: 99 meq/L (ref 96–112)
Creatinine, Ser: 0.61 mg/dL (ref 0.40–1.20)
GFR: 79.19 mL/min (ref >60.00)
Glucose, Bld: 95 mg/dL (ref 70–99)
Potassium: 4.2 meq/L (ref 3.5–5.1)
Sodium: 135 meq/L (ref 135–145)
Total Bilirubin: 0.3 mg/dL (ref 0.2–1.2)
Total Protein: 7.4 g/dL (ref 6.0–8.3)

## 2023-12-22 LAB — POCT URINALYSIS DIP (CLINITEK)
Bilirubin, UA: NEGATIVE
Blood, UA: NEGATIVE
Glucose, UA: NEGATIVE mg/dL
Ketones, POC UA: NEGATIVE mg/dL
Leukocytes, UA: NEGATIVE
Nitrite, UA: NEGATIVE
POC PROTEIN,UA: NEGATIVE
Spec Grav, UA: 1.02 (ref 1.010–1.025)
Urobilinogen, UA: 0.2 U/dL
pH, UA: 6 (ref 5.0–8.0)

## 2023-12-22 LAB — CBC WITH DIFFERENTIAL/PLATELET
Basophils Absolute: 0.1 10*3/uL (ref 0.0–0.1)
Basophils Relative: 0.8 % (ref 0.0–3.0)
Eosinophils Absolute: 0.1 10*3/uL (ref 0.0–0.7)
Eosinophils Relative: 0.9 % (ref 0.0–5.0)
HCT: 36.2 % (ref 36.0–46.0)
Hemoglobin: 12 g/dL (ref 12.0–15.0)
Lymphocytes Relative: 27.9 % (ref 12.0–46.0)
Lymphs Abs: 2.1 10*3/uL (ref 0.7–4.0)
MCHC: 33.2 g/dL (ref 30.0–36.0)
MCV: 97.9 fl (ref 78.0–100.0)
Monocytes Absolute: 0.6 10*3/uL (ref 0.1–1.0)
Monocytes Relative: 7.5 % (ref 3.0–12.0)
Neutro Abs: 4.7 10*3/uL (ref 1.4–7.7)
Neutrophils Relative %: 62.9 % (ref 43.0–77.0)
Platelets: 189 10*3/uL (ref 150.0–400.0)
RBC: 3.7 Mil/uL — ABNORMAL LOW (ref 3.87–5.11)
RDW: 14.5 % (ref 11.5–15.5)
WBC: 7.4 10*3/uL (ref 4.0–10.5)

## 2023-12-22 LAB — HEMOGLOBIN A1C: Hgb A1c MFr Bld: 5.7 % (ref 4.6–6.5)

## 2023-12-22 MED ORDER — QUETIAPINE FUMARATE 100 MG PO TABS: 100.0000 mg | ORAL_TABLET | Freq: Every day | ORAL | 1 refills | Status: DC

## 2023-12-22 NOTE — Assessment & Plan Note (Signed)
 General weakness related to advanced age Clinically stable. Recommend blood work today

## 2023-12-22 NOTE — Addendum Note (Signed)
 Addended by: Roslyn Coombe on: 12/22/2023 08:52 PM   Modules accepted: Orders

## 2023-12-22 NOTE — Assessment & Plan Note (Signed)
 BP Readings from Last 3 Encounters:  12/22/23 124/74  12/03/23 120/74  11/26/23 (!) 133/113  Well-controlled hypertension Off medications.  Diet controlled.

## 2023-12-22 NOTE — Assessment & Plan Note (Signed)
 Stable and well-controlled Continue albuterol  as needed

## 2023-12-22 NOTE — Telephone Encounter (Signed)
 Ok this is noted,  ok to continue to monitor

## 2023-12-22 NOTE — Telephone Encounter (Signed)
 Ok for increased seroquel  to 100 mg at bedtime  - done erx

## 2023-12-22 NOTE — Progress Notes (Signed)
 Theresa Arnold 89 y.o.   Chief Complaint  Patient presents with   Acute Visit    Decreased urinary output, ongoing for about 5 days. Is having confusion associated with this    HISTORY OF PRESENT ILLNESS: Acute problem visit today.  Patient of Dr. Rosalia Colonel. This is a 89 y.o. female accompanied by son today.  Complaining of general weakness and mild confusion with decreased urinary output Patient has no complaints.  She states she feels well.  States she is eating and drinking enough. No other complaints or medical concerns today.  HPI   Prior to Admission medications   Medication Sig Start Date End Date Taking? Authorizing Provider  aspirin EC 325 MG tablet Take 325 mg by mouth daily.   Yes [provider]  desonide (DESOWEN) 0.05 % cream 1 application to affected area Externally Twice a day; Duration: 30 Days 05/22/14  Yes [provider]  hydrochlorothiazide  (MICROZIDE ) 12.5 MG capsule Take 1 capsule (12.5 mg total) by mouth daily as needed (swelling). 12/03/23  Yes Roslyn Coombe, MD  lidocaine  (LIDODERM ) 5 % Place 1 patch onto the skin daily. Remove & Discard patch within 12 hours or as directed by MD 10/27/23  Yes Keith, Kayla N, PA-C  ondansetron  (ZOFRAN -ODT) 4 MG disintegrating tablet 4mg  ODT q4 hours prn nausea/vomit 09/10/23  Yes Floyd, Dan, DO  QUEtiapine  (SEROQUEL ) 50 MG tablet Take 1 tablet (50 mg total) by mouth at bedtime. 12/03/23  Yes Roslyn Coombe, MD  triamcinolone  cream (KENALOG ) 0.1 % Apply 1 Application topically 2 (two) times daily. 08/17/23 08/16/24 Yes Roslyn Coombe, MD    Allergies  Allergen Reactions   Doxycycline  Nausea And Vomiting    Patient Active Problem List   Diagnosis Date Noted   Insomnia 12/05/2023   Weight loss 12/05/2023   Encounter for well adult exam with abnormal findings 08/21/2023   Acne vulgaris 08/17/2023   Seborrheic dermatitis 08/17/2023   Lump in neck 08/13/2022   Vitamin D  deficiency 08/13/2022   URI (upper  respiratory infection) 06/13/2021   HTN (hypertension) 06/13/2021   Other chronic pain 08/15/2020   Gait disorder 08/15/2020   Generalized weakness 08/15/2020   Hyperglycemia 08/02/2020   Ileus, postoperative (HCC) 08/02/2020   Macrocytic anemia 08/02/2020   Hypophosphatemia 08/02/2020   Hypomagnesemia 08/02/2020   Physical deconditioning 08/02/2020   Protein calorie malnutrition (HCC) 08/02/2020   SBO (Arnold bowel obstruction) (HCC) 07/20/2020   Thrombocytopenia (HCC) 07/20/2020   Nausea and vomiting 07/20/2020   Hypokalemia 07/20/2020   S/P colostomy takedown 09/04/2019   Nausea 06/23/2019   Abnormal LFTs 06/11/2019   Acute blood loss anemia 06/11/2019   Hypoxia 03/24/2019   Colonic partial obstruction from sigmoid stricture 03/18/2019   Polyp of sigmoid colon 03/18/2019   Oral thrush 03/18/2019   Abnormal CT scan, gastrointestinal tract    Abnormal CT scan, sigmoid colon    Diverticulitis of sigmoid colon 03/15/2019   LLQ pain 11/02/2018   Abdominal pain 09/21/2018   Venous (peripheral) insufficiency 01/12/2014   Sprain of ankle 06/28/2013   Preventative health care 11/10/2011   Cervical spondylosis 11/10/2011   OA (osteoarthritis) of knee 11/10/2011   COPD (chronic obstructive pulmonary disease) (HCC) 11/10/2011   Dizziness 11/10/2011   Benign neoplasm of colon 11/24/2010   Diverticulosis of colon 11/24/2010   Encounter for long-term (current) use of other medications 10/07/2010   HLD (hyperlipidemia) 09/03/2010   Debility 09/03/2010   SMOKER 09/10/2009    Past Medical History:  Diagnosis  Date   CERVICAL RADICULOPATHY, LEFT 09/03/2010   Cervical spondylosis 11/10/2011   COPD (chronic obstructive pulmonary disease) (HCC) 11/10/2011   Diverticulitis    HTN (hypertension) 06/13/2021   HYPERLIPIDEMIA 09/03/2010   Sigmoid stricture (HCC)     Past Surgical History:  Procedure Laterality Date   COLONOSCOPY     COLOSTOMY REVERSAL N/A 09/04/2019   Procedure: PRIMARY  COLOCOLOSTOMY REVERSAL AND TAKEDOWN OF HARTMAN;  Surgeon: Jacolyn Matar, MD;  Location: [facility];  Service: General;  Laterality: N/A;   EYE SURGERY     bilateral cataracts with lens implants   LAPAROSCOPIC PARTIAL COLECTOMY N/A 03/22/2019   Procedure: LAPAROSCOPY, SPLENIC FLEXURE MOBILIZATION WITH OPEN HARTMANN PROCEDURE;  Surgeon: Jacolyn Matar, MD;  Location: [facility];  Service: General;  Laterality: N/A;   LAPAROTOMY N/A 07/22/2020   Procedure: EXPLORATORY LAPAROTOMY lysis of adhesions;  Surgeon: Oza Blumenthal, MD;  Location: [facility];  Service: General;  Laterality: N/A;    Social History   Socioeconomic History   Marital status: Married    Spouse name: Ernie   Number of children: 1   Years of education: 16   Highest education level: Not on file  Occupational History   Occupation: retired  Tobacco Use   Smoking status: Former    Current packs/day: 0.00    Average packs/day: 1 pack/day for 30.0 years (30.0 ttl pk-yrs)    Types: Cigarettes    Start date: 07/13/1981    Quit date: 07/14/2011    Years since quitting: 12.4    Passive exposure: Never   Smokeless tobacco: Never   Tobacco comments:    QUIT 2013   Vaping Use   Vaping status: Never Used  Substance and Sexual Activity   Alcohol use: Yes    Comment: wine occasionally   Drug use: No   Sexual activity: Not on file  Other Topics Concern   Not on file  Social History Narrative   Patient lives at home with spouse( Ernie)   Patient is retired.   Patient has a college education.   Patient has one adult child.   Patient is right-handed   Caffeine Use: 1 cup daily   Social Drivers of Corporate investment banker Strain: Not on file  Food Insecurity: Not on file  Transportation Needs: Not on file  Physical Activity: Not on file  Stress: Not on file  Social Connections: Not on file  Intimate Partner Violence: Not on file    Family History  Problem Relation Age of Onset   Colon cancer Sister    Breast cancer  Sister    Heart disease Father    Hip fracture Mother    Hypertension Other      Review of Systems  Constitutional:  Positive for malaise/fatigue. Negative for chills and fever.  HENT: Negative.  Negative for congestion and sore throat.   Respiratory: Negative.  Negative for cough and shortness of breath.   Cardiovascular: Negative.  Negative for chest pain and palpitations.  Gastrointestinal:  Negative for abdominal pain, diarrhea, nausea and vomiting.  Genitourinary: Negative.  Negative for dysuria and hematuria.  Skin: Negative.  Negative for rash.  Neurological: Negative.  Negative for dizziness and headaches.  All other systems reviewed and are negative.   Vitals:   12/22/23 1416  BP: 124/74  Pulse: 70  Temp: 97.8 F (36.6 C)  SpO2: 96%    Physical Exam Vitals reviewed.  Constitutional:      Appearance: Normal appearance.  HENT:  Head: Normocephalic.     Mouth/Throat:     Mouth: Mucous membranes are moist.     Pharynx: Oropharynx is clear.  Eyes:     Extraocular Movements: Extraocular movements intact.     Conjunctiva/sclera: Conjunctivae normal.     Pupils: Pupils are equal, round, and reactive to light.  Cardiovascular:     Rate and Rhythm: Normal rate and regular rhythm.     Pulses: Normal pulses.     Heart sounds: Normal heart sounds.  Pulmonary:     Effort: Pulmonary effort is normal.     Breath sounds: Normal breath sounds.  Abdominal:     Palpations: Abdomen is soft.     Tenderness: There is no abdominal tenderness.  Musculoskeletal:     Cervical back: No tenderness.  Lymphadenopathy:     Cervical: No cervical adenopathy.  Skin:    General: Skin is warm and dry.  Neurological:     Mental Status: She is alert and oriented to person, place, and time.  Psychiatric:        Mood and Affect: Mood normal.        Behavior: Behavior normal.    Results for orders placed or performed in visit on 12/22/23 (from the past 24 hours)  POCT URINALYSIS  DIP (CLINITEK)     Status: Normal   Collection Time: 12/22/23  2:35 PM  Result Value Ref Range   Color, UA yellow yellow   Clarity, UA clear clear   Glucose, UA negative negative mg/dL   Bilirubin, UA negative negative   Ketones, POC UA negative negative mg/dL   Spec Grav, UA 6.213 0.865 - 1.025   Blood, UA negative negative   pH, UA 6.0 5.0 - 8.0   POC PROTEIN,UA negative negative, trace   Urobilinogen, UA 0.2 0.2 or 1.0 E.U./dL   Nitrite, UA Negative Negative   Leukocytes, UA Negative Negative      ASSESSMENT & PLAN: A total of 44 minutes was spent with the patient and counseling/coordination of care regarding preparing for this visit, review of most recent office visit notes, review of multiple chronic medical conditions and their management, review of all medications, review of most recent bloodwork results, review of health maintenance items, education on nutrition, prognosis, documentation, and need for follow up.   Problem List Items Addressed This Visit       Cardiovascular and Mediastinum   HTN (hypertension)   BP Readings from Last 3 Encounters:  12/22/23 124/74  12/03/23 120/74  11/26/23 (!) 133/113  Well-controlled hypertension Off medications.  Diet controlled.         Respiratory   COPD (chronic obstructive pulmonary disease) (HCC)   Stable and well-controlled Continue albuterol  as needed        Other   Generalized weakness   Chronic condition Recommend blood work today Advised to stay well-hydrated Diet and nutrition discussed      Relevant Orders   Comprehensive metabolic panel with GFR   CBC with Differential/Platelet   TSH   Urine Culture   Hemoglobin A1c   Frailty syndrome in geriatric patient - Primary   General weakness related to advanced age Clinically stable. Recommend blood work today      Relevant Orders   Comprehensive metabolic panel with GFR   CBC with Differential/Platelet   TSH   Urine Culture   Hemoglobin A1c    Other Visit Diagnoses       Decreased urination       Relevant Orders  POCT URINALYSIS DIP (CLINITEK) (Completed)      Patient Instructions  Health Maintenance After Age 58 After age 14, you are at a higher risk for certain long-term diseases and infections as well as injuries from falls. Falls are a major cause of broken bones and head injuries in people who are older than age 46. Getting regular preventive care can help to keep you healthy and well. Preventive care includes getting regular testing and making lifestyle changes as recommended by your health care provider. Talk with your health care provider about: Which screenings and tests you should have. A screening is a test that checks for a disease when you have no symptoms. A diet and exercise plan that is right for you. What should I know about screenings and tests to prevent falls? Screening and testing are the best ways to find a health problem early. Early diagnosis and treatment give you the best chance of managing medical conditions that are common after age 30. Certain conditions and lifestyle choices may make you more likely to have a fall. Your health care provider may recommend: Regular vision checks. Poor vision and conditions such as cataracts can make you more likely to have a fall. If you wear glasses, make sure to get your prescription updated if your vision changes. Medicine review. Work with your health care provider to regularly review all of the medicines you are taking, including over-the-counter medicines. Ask your health care provider about any side effects that may make you more likely to have a fall. Tell your health care provider if any medicines that you take make you feel dizzy or sleepy. Strength and balance checks. Your health care provider may recommend certain tests to check your strength and balance while standing, walking, or changing positions. Foot health exam. Foot pain and numbness, as well as not  wearing proper footwear, can make you more likely to have a fall. Screenings, including: Osteoporosis screening. Osteoporosis is a condition that causes the bones to get weaker and break more easily. Blood pressure screening. Blood pressure changes and medicines to control blood pressure can make you feel dizzy. Depression screening. You may be more likely to have a fall if you have a fear of falling, feel depressed, or feel unable to do activities that you used to do. Alcohol use screening. Using too much alcohol can affect your balance and may make you more likely to have a fall. Follow these instructions at home: Lifestyle Do not drink alcohol if: Your health care provider tells you not to drink. If you drink alcohol: Limit how much you have to: 0-1 drink a day for women. 0-2 drinks a day for men. Know how much alcohol is in your drink. In the [place] one drink equals one 12 oz bottle of beer (355 mL), one 5 oz glass of wine (148 mL), or one 1 oz glass of hard liquor (44 mL). Do not use any products that contain nicotine or tobacco. These products include cigarettes, chewing tobacco, and vaping devices, such as e-cigarettes. If you need help quitting, ask your health care provider. Activity  Follow a regular exercise program to stay fit. This will help you maintain your balance. Ask your health care provider what types of exercise are appropriate for you. If you need a cane or walker, use it as recommended by your health care provider. Wear supportive shoes that have nonskid soles. Safety  Remove any tripping hazards, such as rugs, cords, and clutter. Install safety equipment such as  grab bars in bathrooms and safety rails on stairs. Keep rooms and walkways well-lit. General instructions Talk with your health care provider about your risks for falling. Tell your health care provider if: You fall. Be sure to tell your health care provider about all falls, even ones that seem minor. You  feel dizzy, tiredness (fatigue), or off-balance. Take over-the-counter and prescription medicines only as told by your health care provider. These include supplements. Eat a healthy diet and maintain a healthy weight. A healthy diet includes low-fat dairy products, low-fat (lean) meats, and fiber from whole grains, beans, and lots of fruits and vegetables. Stay current with your vaccines. Schedule regular health, dental, and eye exams. Summary Having a healthy lifestyle and getting preventive care can help to protect your health and wellness after age 88. Screening and testing are the best way to find a health problem early and help you avoid having a fall. Early diagnosis and treatment give you the best chance for managing medical conditions that are more common for people who are older than age 43. Falls are a major cause of broken bones and head injuries in people who are older than age 30. Take precautions to prevent a fall at home. Work with your health care provider to learn what changes you can make to improve your health and wellness and to prevent falls. This information is not intended to replace advice given to you by your health care provider. Make sure you discuss any questions you have with your health care provider. Document Revised: 11/18/2020 Document Reviewed: 11/18/2020 Elsevier Patient Education  2024 Elsevier Inc.       Theresa Small, MD [facility]

## 2023-12-22 NOTE — Patient Instructions (Signed)
 Health Maintenance After Age 88 After age 4, you are at a higher risk for certain long-term diseases and infections as well as injuries from falls. Falls are a major cause of broken bones and head injuries in people who are older than age 47. Getting regular preventive care can help to keep you healthy and well. Preventive care includes getting regular testing and making lifestyle changes as recommended by your health care provider. Talk with your health care provider about: Which screenings and tests you should have. A screening is a test that checks for a disease when you have no symptoms. A diet and exercise plan that is right for you. What should I know about screenings and tests to prevent falls? Screening and testing are the best ways to find a health problem early. Early diagnosis and treatment give you the best chance of managing medical conditions that are common after age 37. Certain conditions and lifestyle choices may make you more likely to have a fall. Your health care provider may recommend: Regular vision checks. Poor vision and conditions such as cataracts can make you more likely to have a fall. If you wear glasses, make sure to get your prescription updated if your vision changes. Medicine review. Work with your health care provider to regularly review all of the medicines you are taking, including over-the-counter medicines. Ask your health care provider about any side effects that may make you more likely to have a fall. Tell your health care provider if any medicines that you take make you feel dizzy or sleepy. Strength and balance checks. Your health care provider may recommend certain tests to check your strength and balance while standing, walking, or changing positions. Foot health exam. Foot pain and numbness, as well as not wearing proper footwear, can make you more likely to have a fall. Screenings, including: Osteoporosis screening. Osteoporosis is a condition that causes  the bones to get weaker and break more easily. Blood pressure screening. Blood pressure changes and medicines to control blood pressure can make you feel dizzy. Depression screening. You may be more likely to have a fall if you have a fear of falling, feel depressed, or feel unable to do activities that you used to do. Alcohol use screening. Using too much alcohol can affect your balance and may make you more likely to have a fall. Follow these instructions at home: Lifestyle Do not drink alcohol if: Your health care provider tells you not to drink. If you drink alcohol: Limit how much you have to: 0-1 drink a day for women. 0-2 drinks a day for men. Know how much alcohol is in your drink. In the [place] one drink equals one 12 oz bottle of beer (355 mL), one 5 oz glass of wine (148 mL), or one 1 oz glass of hard liquor (44 mL). Do not use any products that contain nicotine or tobacco. These products include cigarettes, chewing tobacco, and vaping devices, such as e-cigarettes. If you need help quitting, ask your health care provider. Activity  Follow a regular exercise program to stay fit. This will help you maintain your balance. Ask your health care provider what types of exercise are appropriate for you. If you need a cane or walker, use it as recommended by your health care provider. Wear supportive shoes that have nonskid soles. Safety  Remove any tripping hazards, such as rugs, cords, and clutter. Install safety equipment such as grab bars in bathrooms and safety rails on stairs. Keep rooms and walkways  well-lit. General instructions Talk with your health care provider about your risks for falling. Tell your health care provider if: You fall. Be sure to tell your health care provider about all falls, even ones that seem minor. You feel dizzy, tiredness (fatigue), or off-balance. Take over-the-counter and prescription medicines only as told by your health care provider. These include  supplements. Eat a healthy diet and maintain a healthy weight. A healthy diet includes low-fat dairy products, low-fat (lean) meats, and fiber from whole grains, beans, and lots of fruits and vegetables. Stay current with your vaccines. Schedule regular health, dental, and eye exams. Summary Having a healthy lifestyle and getting preventive care can help to protect your health and wellness after age 11. Screening and testing are the best way to find a health problem early and help you avoid having a fall. Early diagnosis and treatment give you the best chance for managing medical conditions that are more common for people who are older than age 28. Falls are a major cause of broken bones and head injuries in people who are older than age 48. Take precautions to prevent a fall at home. Work with your health care provider to learn what changes you can make to improve your health and wellness and to prevent falls. This information is not intended to replace advice given to you by your health care provider. Make sure you discuss any questions you have with your health care provider. Document Revised: 11/18/2020 Document Reviewed: 11/18/2020 Elsevier Patient Education  2024 [employer].

## 2023-12-22 NOTE — Telephone Encounter (Signed)
 Patient seen a different provider in our office today. Her son would like to know if her Seroquel  can be increased? He states patient is always up during the night roaming the house, as she is unable to stay asleep. Please advise

## 2023-12-22 NOTE — Assessment & Plan Note (Signed)
 Chronic condition Recommend blood work today Advised to stay well-hydrated Diet and nutrition discussed

## 2023-12-23 DIAGNOSIS — E785 Hyperlipidemia, unspecified: Secondary | ICD-10-CM | POA: Diagnosis not present

## 2023-12-23 DIAGNOSIS — Z8719 Personal history of other diseases of the digestive system: Secondary | ICD-10-CM | POA: Diagnosis not present

## 2023-12-23 DIAGNOSIS — I10 Essential (primary) hypertension: Secondary | ICD-10-CM | POA: Diagnosis not present

## 2023-12-23 DIAGNOSIS — L219 Seborrheic dermatitis, unspecified: Secondary | ICD-10-CM | POA: Diagnosis not present

## 2023-12-23 DIAGNOSIS — S301XXD Contusion of abdominal wall, subsequent encounter: Secondary | ICD-10-CM | POA: Diagnosis not present

## 2023-12-23 DIAGNOSIS — E559 Vitamin D deficiency, unspecified: Secondary | ICD-10-CM | POA: Diagnosis not present

## 2023-12-23 DIAGNOSIS — M5412 Radiculopathy, cervical region: Secondary | ICD-10-CM | POA: Diagnosis not present

## 2023-12-23 DIAGNOSIS — M545 Low back pain, unspecified: Secondary | ICD-10-CM | POA: Diagnosis not present

## 2023-12-23 DIAGNOSIS — J449 Chronic obstructive pulmonary disease, unspecified: Secondary | ICD-10-CM | POA: Diagnosis not present

## 2023-12-23 LAB — TSH: TSH: 1.04 u[IU]/mL (ref 0.35–5.50)

## 2023-12-30 DIAGNOSIS — Z8719 Personal history of other diseases of the digestive system: Secondary | ICD-10-CM | POA: Diagnosis not present

## 2023-12-30 DIAGNOSIS — M5412 Radiculopathy, cervical region: Secondary | ICD-10-CM | POA: Diagnosis not present

## 2023-12-30 DIAGNOSIS — S301XXD Contusion of abdominal wall, subsequent encounter: Secondary | ICD-10-CM | POA: Diagnosis not present

## 2023-12-30 DIAGNOSIS — E785 Hyperlipidemia, unspecified: Secondary | ICD-10-CM | POA: Diagnosis not present

## 2023-12-30 DIAGNOSIS — L219 Seborrheic dermatitis, unspecified: Secondary | ICD-10-CM | POA: Diagnosis not present

## 2023-12-30 DIAGNOSIS — J449 Chronic obstructive pulmonary disease, unspecified: Secondary | ICD-10-CM | POA: Diagnosis not present

## 2023-12-30 DIAGNOSIS — I10 Essential (primary) hypertension: Secondary | ICD-10-CM | POA: Diagnosis not present

## 2023-12-30 DIAGNOSIS — E559 Vitamin D deficiency, unspecified: Secondary | ICD-10-CM | POA: Diagnosis not present

## 2023-12-30 DIAGNOSIS — M545 Low back pain, unspecified: Secondary | ICD-10-CM | POA: Diagnosis not present

## 2023-12-31 ENCOUNTER — Other Ambulatory Visit (HOSPITAL_COMMUNITY): Payer: Self-pay

## 2023-12-31 ENCOUNTER — Encounter: Payer: Self-pay | Admitting: Family Medicine

## 2023-12-31 ENCOUNTER — Ambulatory Visit: Payer: Self-pay

## 2023-12-31 ENCOUNTER — Telehealth: Payer: Self-pay

## 2023-12-31 ENCOUNTER — Telehealth (INDEPENDENT_AMBULATORY_CARE_PROVIDER_SITE_OTHER): Admitting: Family Medicine

## 2023-12-31 VITALS — Wt 122.4 lb

## 2023-12-31 DIAGNOSIS — R4182 Altered mental status, unspecified: Secondary | ICD-10-CM

## 2023-12-31 DIAGNOSIS — F03911 Unspecified dementia, unspecified severity, with agitation: Secondary | ICD-10-CM

## 2023-12-31 MED ORDER — BREXPIPRAZOLE 0.5 MG PO TABS: ORAL_TABLET | ORAL | 1 refills | Status: AC

## 2023-12-31 NOTE — Telephone Encounter (Signed)
 Pharmacy Patient Advocate Encounter   Received notification from CoverMyMeds that prior authorization for Rexulti 0.5 is required/requested.   Insurance verification completed.   The patient is insured through [employer] .   Per test claim: PA required; PA submitted to above mentioned insurance via CoverMyMeds Key/confirmation #/EOC [id number] Status is pending

## 2023-12-31 NOTE — Telephone Encounter (Signed)
 Pharmacy Patient Advocate Encounter  Received notification from [employer] that Prior Authorization for rexulti 0.5 has been APPROVED from 07/14/23 to 07/12/24. Ran test claim, Copay is $100.00. This test claim was processed through [facility]- copay amounts may vary at other pharmacies due to pharmacy/plan contracts, or as the patient moves through the different stages of their insurance plan.   PA #/Case ID/Reference #: [id number]

## 2023-12-31 NOTE — Telephone Encounter (Signed)
 FYI Only or Action Required?: FYI only for provider.  Patient was last seen in primary care on 12/22/2023 by Elvira Hammersmith, MD. Called Nurse Triage reporting Neurologic Problem, wandering, confusion. Symptoms began Awhile ago - comes and goes. Interventions attempted: Nothing. Symptoms are: gradually worsening.  Triage Disposition: No disposition on file.  Patient/caregiver understands and will follow disposition?: Yes - No appts available in office - CAL and transferred call to schedule at another practice.              Copied from CRM [id number]. Topic: Clinical - Red Word Triage >> Dec 31, 2023  8:12 AM Kita Perish H wrote: [facility] that prompted transfer to Nurse Triage: Patients son is calling due to patient being in a confused state last night, opening cabinets turning on the tv just a lot looking for possible medication. Reason for Disposition . [1] Longstanding confusion (e.g., dementia, stroke) AND [2] worsening  Answer Assessment - Initial Assessment Questions 1. LEVEL OF CONSCIOUSNESS: How is he (she, the patient) acting right now? (e.g., alert-oriented, confused, lethargic, stuporous, comatose)     A little bettter 2. ONSET: When did the confusion start?  (minutes, hours, days)     awhile 3. PATTERN Does this come and go, or has it been constant since it started?  Is it present now?     Comes and goes 4. ALCOHOL or DRUGS: Has he been drinking alcohol or taking any drugs?      no 5. NARCOTIC MEDICINES: Has he been receiving any narcotic medications? (e.g., morphine , Vicodin)     no 6. CAUSE: What do you think is causing the confusion?      unsure 7. OTHER SYMPTOMS: Are there any other symptoms? (e.g., difficulty breathing, headache, fever, weakness)     no  Protocols used: Confusion - Delirium-A-AH

## 2023-12-31 NOTE — Progress Notes (Unsigned)
 Virtual Medical Office Visit  Patient:  Theresa Arnold      Age: 89 y.o.       Sex:  female  Date:   12/31/2023  PCP:    Theresa Arnold ORN, MD   Today's Healthcare Provider: Heron CHRISTELLA Sharper, MD    Assessment/Plan:   Summary assessment:  Theresa Arnold was seen today for altered mental status.  Agitation due to dementia (HCC) -     Brexpiprazole ; Take 0.5 tablets (0.25 mg total) by mouth daily for 7 days, THEN 1 tablet (0.5 mg total) daily for 7 days, THEN 2 tablets (1 mg total) daily for 7 days.  Dispense: 60 tablet; Refill: 1 -     Ambulatory referral to Neurology   I had a long conversation with her son about her declining status and the agitation she is experiencing. I described the gradual cognitive decline vs acute mental status changes and the etiology of both conditions. After listening to the history I feel that she falls within the former category and I recommend starting rexulti  at a low dose to help with the agitation. I will also place a referral to neurology for a full assessment and to get a formal diagnosis.   No follow-ups on file.   She was advised to call the office or go to ER if her condition worsens    Subjective:   Theresa Arnold is a 89 y.o. female with PMH significant for: Past Medical History:  Diagnosis Date   CERVICAL RADICULOPATHY, LEFT 09/03/2010   Cervical spondylosis 11/10/2011   COPD (chronic obstructive pulmonary disease) (HCC) 11/10/2011   Diverticulitis    HTN (hypertension) 06/13/2021   HYPERLIPIDEMIA 09/03/2010   Sigmoid stricture (HCC)      Presenting today with: Chief Complaint  Patient presents with   Altered Mental Status    Pt is with son, Theresa Arnold. Reports BP 120/60 yesterday.  Increased confusion.      She clarifies and reports that her condition: Pt is a patient of Dr. Norleen. Son is present in the visit. He is reporting that she is taking things out of the cabinets  and saying things over and over again. This has been going on off and  on for weeks now, even before she was in the nursing facility from her fall. I reviewed her medical history and she has a history of insomnia and was having confusion at night, was given seroquel  and it seemed to have the opposite effect per the son's report. He denies any stroke like symptoms, states she was seen by Dr. Purcell on 6/11 (notes reviewed)  and he ordered CBC, CMP and urinalysis which were all normal. Her confusion has persisted since then. Son reports that she is up all night and sleeps during the day  states she is confused, wandering through the house sometimes. States that   She denies having any: Fever/chills, no stroke like symptoms, no facial droop.,           Objective/Observations  Physical Exam:  Polite and friendly, apprears confused, not a good historian Gen: NAD, resting comfortably, not oriented to situation or time, cannot count backwards from 20 or say the months of the year Pulm: Normal work of breathing Neuro: Grossly normal, moves all extremities -- no facial droop, no slurred speech Psych: flat afect Problem specific physical exam findings: see above   No images are attached to the encounter or orders placed in the encounter.    Results: No results found  for any visits on 12/31/23.        Virtual Visit via Video   I connected with Theresa Arnold on 12/31/23 at  1:30 PM EDT by a video enabled telemedicine application and verified that I am speaking with the correct person using two identifiers. The limitations of evaluation and management by telemedicine and the availability of in person appointments were discussed. The patient expressed understanding and agreed to proceed.   Percentage of appointment time on video:  100% Patient location: Home Provider location: [facility] Persons participating in the virtual visit: Myself and Patient

## 2024-01-03 ENCOUNTER — Other Ambulatory Visit (HOSPITAL_COMMUNITY): Payer: Self-pay

## 2024-01-03 DIAGNOSIS — E785 Hyperlipidemia, unspecified: Secondary | ICD-10-CM | POA: Diagnosis not present

## 2024-01-03 DIAGNOSIS — S301XXD Contusion of abdominal wall, subsequent encounter: Secondary | ICD-10-CM | POA: Diagnosis not present

## 2024-01-03 DIAGNOSIS — Z8719 Personal history of other diseases of the digestive system: Secondary | ICD-10-CM | POA: Diagnosis not present

## 2024-01-03 DIAGNOSIS — I10 Essential (primary) hypertension: Secondary | ICD-10-CM | POA: Diagnosis not present

## 2024-01-03 DIAGNOSIS — M545 Low back pain, unspecified: Secondary | ICD-10-CM | POA: Diagnosis not present

## 2024-01-03 DIAGNOSIS — J449 Chronic obstructive pulmonary disease, unspecified: Secondary | ICD-10-CM | POA: Diagnosis not present

## 2024-01-03 DIAGNOSIS — E559 Vitamin D deficiency, unspecified: Secondary | ICD-10-CM | POA: Diagnosis not present

## 2024-01-03 DIAGNOSIS — L219 Seborrheic dermatitis, unspecified: Secondary | ICD-10-CM | POA: Diagnosis not present

## 2024-01-03 DIAGNOSIS — M5412 Radiculopathy, cervical region: Secondary | ICD-10-CM | POA: Diagnosis not present

## 2024-01-03 NOTE — Telephone Encounter (Signed)
 Left a detailed message at the [facility] pharmacy voicemail with the approval information as below.

## 2024-01-07 ENCOUNTER — Encounter: Payer: Self-pay | Admitting: Physician Assistant

## 2024-01-11 ENCOUNTER — Emergency Department (HOSPITAL_BASED_OUTPATIENT_CLINIC_OR_DEPARTMENT_OTHER)
Admission: EM | Admit: 2024-01-11 | Discharge: 2024-01-11 | Disposition: A | Discharge status: Home / Self Care | Attending: Emergency Medicine | Admitting: Emergency Medicine

## 2024-01-11 ENCOUNTER — Other Ambulatory Visit: Payer: Self-pay

## 2024-01-11 DIAGNOSIS — R600 Localized edema: Secondary | ICD-10-CM | POA: Diagnosis not present

## 2024-01-11 DIAGNOSIS — Z7982 Long term (current) use of aspirin: Secondary | ICD-10-CM | POA: Diagnosis not present

## 2024-01-11 DIAGNOSIS — R609 Edema, unspecified: Secondary | ICD-10-CM

## 2024-01-11 MED ORDER — FUROSEMIDE 20 MG PO TABS: 20.0000 mg | ORAL_TABLET | Freq: Every day | ORAL | 0 refills | Status: DC

## 2024-01-11 MED ORDER — POTASSIUM CHLORIDE CRYS ER 20 MEQ PO TBCR: 20.0000 meq | EXTENDED_RELEASE_TABLET | Freq: Every day | ORAL | 0 refills | Status: DC

## 2024-01-11 NOTE — ED Triage Notes (Signed)
 Chronic ankle swelling. Takes hydrochlorothiazide . States meds are not working anymore. Denies SHOB.   Reports fall months ago that caused left rib pain. Denies any new injury but states pain never got better.

## 2024-01-11 NOTE — ED Notes (Signed)
 DC paperwork given and verbally understood.

## 2024-01-11 NOTE — ED Provider Notes (Signed)
 [place] EMERGENCY DEPARTMENT AT [facility] Provider Note   CSN: [id number] Arrival date & time: 01/11/24  1226     Patient presents with: Joint Swelling   Theresa Arnold is a 89 y.o. female here with concern for edema in lower legs and pain in ankles. She takes hydrochlorothiazide  chronically.  Has tried but cannot tolerate compression stockings.  Has tried low salt diet.  Here with son at bedside   HPI     Prior to Admission medications   Medication Sig Start Date End Date Taking? Authorizing Provider  furosemide (LASIX) 20 MG tablet Take 1 tablet (20 mg total) by mouth daily for 5 days. 01/11/24 01/16/24 Yes Zakiya Sporrer, Donnice PARAS, MD  potassium chloride  SA (KLOR-CON  M) 20 MEQ tablet Take 1 tablet (20 mEq total) by mouth daily for 5 days. 01/11/24 01/16/24 Yes Cottie Donnice PARAS, MD  aspirin EC 325 MG tablet Take 325 mg by mouth daily.    [provider]  Brexpiprazole  (REXULTI ) 0.5 MG TABS Take 0.5 tablets (0.25 mg total) by mouth daily for 7 days, THEN 1 tablet (0.5 mg total) daily for 7 days, THEN 2 tablets (1 mg total) daily for 7 days. 12/31/23 01/21/24  Ozell Heron HERO, MD  desonide (DESOWEN) 0.05 % cream 1 application to affected area Externally Twice a day; Duration: 30 Days 05/22/14   [provider]  hydrochlorothiazide  (MICROZIDE ) 12.5 MG capsule Take 1 capsule (12.5 mg total) by mouth daily as needed (swelling). 12/03/23   Norleen Lynwood ORN, MD  lidocaine  (LIDODERM ) 5 % Place 1 patch onto the skin daily. Remove & Discard patch within 12 hours or as directed by MD Patient not taking: Reported on 12/31/2023 10/27/23   Keith, Kayla N, PA-C  ondansetron  (ZOFRAN -ODT) 4 MG disintegrating tablet 4mg  ODT q4 hours prn nausea/vomit Patient not taking: Reported on 12/31/2023 09/10/23   Floyd, Dan, DO  triamcinolone  cream (KENALOG ) 0.1 % Apply 1 Application topically 2 (two) times daily. Patient not taking: Reported on 12/31/2023 08/17/23 08/16/24  Norleen Lynwood ORN, MD     Allergies: Doxycycline     Review of Systems  Updated Vital Signs BP (!) 126/51   Pulse 75   Temp 98.4 F (36.9 C)   Resp (!) 21   Ht 5' 1 (1.549 m)   Wt 56.2 kg   SpO2 100%   BMI 23.43 kg/m   Physical Exam Constitutional:      General: She is not in acute distress. HENT:     Head: Normocephalic and atraumatic.   Eyes:     Conjunctiva/sclera: Conjunctivae normal.     Pupils: Pupils are equal, round, and reactive to light.    Cardiovascular:     Rate and Rhythm: Normal rate and regular rhythm.  Pulmonary:     Effort: Pulmonary effort is normal. No respiratory distress.  Abdominal:     General: There is no distension.     Tenderness: There is no abdominal tenderness.   Musculoskeletal:     Right lower leg: Edema present.     Left lower leg: Edema present.   Skin:    General: Skin is warm and dry.   Neurological:     General: No focal deficit present.     Mental Status: She is alert. Mental status is at baseline.   Psychiatric:        Mood and Affect: Mood normal.        Behavior: Behavior normal.     (all labs ordered are listed,  but only abnormal results are displayed) Labs Reviewed - No data to display  EKG: EKG Interpretation Date/Time:  Tuesday January 11 2024 12:51:03 EDT Ventricular Rate:  72 PR Interval:  219 QRS Duration:  101 QT Interval:  402 QTC Calculation: 440 R Axis:   -60  Text Interpretation: Sinus rhythm Borderline prolonged PR interval Left anterior fascicular block Confirmed by Cottie Cough [phone]) on 01/11/2024 1:54:14 PM  Radiology: No results found.   Procedures   Medications Ordered in the ED - No data to display                                  Medical Decision Making Risk Prescription drug management.   Atraumatic swelling of lower legs Doubt acute dvt, cellulitis, septic joint, infection Doesn't seem like gout clinically No indication for xrays  Likely related to lymphedema and age related changes She  has had several hospital/ed visits for this complaint; hydrochlorothiazide  alone is not producing much diuresis at home  We are going to try lasix 5 days 20 mg in the AM with K supplement; son is taking her home  They can follow up with PCP Okay for discharge     Final diagnoses:  Edema, unspecified type    ED Discharge Orders          Ordered    furosemide (LASIX) 20 MG tablet  Daily        01/11/24 1411    potassium chloride  SA (KLOR-CON  M) 20 MEQ tablet  Daily        01/11/24 1411               Cottie Cough PARAS, MD 01/11/24 1413

## 2024-01-12 ENCOUNTER — Telehealth: Payer: Self-pay | Admitting: Internal Medicine

## 2024-01-12 DIAGNOSIS — I10 Essential (primary) hypertension: Secondary | ICD-10-CM | POA: Diagnosis not present

## 2024-01-12 DIAGNOSIS — E785 Hyperlipidemia, unspecified: Secondary | ICD-10-CM | POA: Diagnosis not present

## 2024-01-12 DIAGNOSIS — Z8719 Personal history of other diseases of the digestive system: Secondary | ICD-10-CM | POA: Diagnosis not present

## 2024-01-12 DIAGNOSIS — M5412 Radiculopathy, cervical region: Secondary | ICD-10-CM | POA: Diagnosis not present

## 2024-01-12 DIAGNOSIS — S301XXD Contusion of abdominal wall, subsequent encounter: Secondary | ICD-10-CM | POA: Diagnosis not present

## 2024-01-12 DIAGNOSIS — E559 Vitamin D deficiency, unspecified: Secondary | ICD-10-CM | POA: Diagnosis not present

## 2024-01-12 DIAGNOSIS — L219 Seborrheic dermatitis, unspecified: Secondary | ICD-10-CM | POA: Diagnosis not present

## 2024-01-12 DIAGNOSIS — M545 Low back pain, unspecified: Secondary | ICD-10-CM | POA: Diagnosis not present

## 2024-01-12 DIAGNOSIS — J449 Chronic obstructive pulmonary disease, unspecified: Secondary | ICD-10-CM | POA: Diagnosis not present

## 2024-01-12 NOTE — Telephone Encounter (Signed)
 Copied from CRM [id number]. Topic: Clinical - Medical Advice >> Jan 12, 2024 12:27 PM Chasity T wrote: Reason for CRM: Sree from [employer] is calling in to advise PCP that due to patient body weight increasing she was sent to the ER on yesterday and body weight is still high.

## 2024-01-12 NOTE — Telephone Encounter (Signed)
Needs OV if possible 

## 2024-01-17 DIAGNOSIS — J449 Chronic obstructive pulmonary disease, unspecified: Secondary | ICD-10-CM | POA: Diagnosis not present

## 2024-01-24 ENCOUNTER — Encounter: Payer: Self-pay | Admitting: Internal Medicine

## 2024-01-24 ENCOUNTER — Ambulatory Visit (INDEPENDENT_AMBULATORY_CARE_PROVIDER_SITE_OTHER): Admitting: Internal Medicine

## 2024-01-24 VITALS — BP 148/96 | HR 73 | Temp 98.2°F | Ht 61.0 in

## 2024-01-24 DIAGNOSIS — E559 Vitamin D deficiency, unspecified: Secondary | ICD-10-CM | POA: Diagnosis not present

## 2024-01-24 DIAGNOSIS — J449 Chronic obstructive pulmonary disease, unspecified: Secondary | ICD-10-CM | POA: Diagnosis not present

## 2024-01-24 DIAGNOSIS — I10 Essential (primary) hypertension: Secondary | ICD-10-CM

## 2024-01-24 DIAGNOSIS — I872 Venous insufficiency (chronic) (peripheral): Secondary | ICD-10-CM | POA: Diagnosis not present

## 2024-01-24 MED ORDER — REXULTI 0.5 MG PO TABS: ORAL_TABLET | ORAL | 5 refills | Status: DC

## 2024-01-24 MED ORDER — FUROSEMIDE 20 MG PO TABS
20.0000 mg | ORAL_TABLET | Freq: Every day | ORAL | 3 refills | Status: DC
Start: 2024-01-24 — End: 2024-04-21

## 2024-01-24 MED ORDER — POTASSIUM CHLORIDE ER 10 MEQ PO TBCR: 10.0000 meq | EXTENDED_RELEASE_TABLET | Freq: Every day | ORAL | 3 refills | Status: AC

## 2024-01-24 NOTE — Patient Instructions (Addendum)
 Ok to continue the lasix  20 mg per day, with a lower dose Potassium pill - 1 per day  [facility] for the refill of rexulti  to see neurology in October  Please continue all other medications as before, and refills have been done if requested.  Please have the pharmacy call with any other refills you may need.  Please keep your appointments with your specialists as you may have planned  Please make an Appointment to return in 6 months, or sooner if needed

## 2024-01-24 NOTE — Progress Notes (Signed)
 Patient ID: Theresa Arnold, female   DOB: [birth year], 89 y.o.   MRN: [id number]        Chief Complaint: follow up peripheral edema, dementia, htn, low vit d       HPI:  Theresa Arnold is a 89 y.o. female here with son, overall doing ok with improved then worsened again after ED visit July 1 where hct changed to lasix  20 every day with potassium 20 meq per day (hard to take this large pill).  Ran out after 5 days.  Swelling improved then worsening again with leg discomfort.  Pt denies chest pain, increased sob or doe, wheezing, orthopnea, PND, palpitations, dizziness or syncope.   Pt denies polydipsia, polyuria, or new focal neuro s/s.   Also with dementia and agitation now improved with rexulti  now at 0.5 mg bid, but out of refills, needs now as next neurology not until oct 16       Wt Readings from Last 3 Encounters:  01/11/24 124 lb (56.2 kg)  12/31/23 122 lb 6.4 oz (55.5 kg)  12/22/23 120 lb 12.8 oz (54.8 kg)   BP Readings from Last 3 Encounters:  01/24/24 (!) 148/96  01/11/24 (!) 126/51  12/22/23 124/74         Past Medical History:  Diagnosis Date   CERVICAL RADICULOPATHY, LEFT 09/03/2010   Cervical spondylosis 11/10/2011   COPD (chronic obstructive pulmonary disease) (HCC) 11/10/2011   Diverticulitis    HTN (hypertension) 06/13/2021   HYPERLIPIDEMIA 09/03/2010   Sigmoid stricture (HCC)    Past Surgical History:  Procedure Laterality Date   COLONOSCOPY     COLOSTOMY REVERSAL N/A 09/04/2019   Procedure: PRIMARY COLOCOLOSTOMY REVERSAL AND TAKEDOWN OF HARTMAN;  Surgeon: Gladis Cough, MD;  Location: [facility];  Service: General;  Laterality: N/A;   EYE SURGERY     bilateral cataracts with lens implants   LAPAROSCOPIC PARTIAL COLECTOMY N/A 03/22/2019   Procedure: LAPAROSCOPY, SPLENIC FLEXURE MOBILIZATION WITH OPEN HARTMANN PROCEDURE;  Surgeon: Gladis Cough, MD;  Location: [facility];  Service: General;  Laterality: N/A;   LAPAROTOMY N/A 07/22/2020   Procedure: EXPLORATORY LAPAROTOMY  lysis of adhesions;  Surgeon: Vernetta Berg, MD;  Location: [facility];  Service: General;  Laterality: N/A;    reports that she quit smoking about 12 years ago. Her smoking use included cigarettes. She started smoking about 42 years ago. She has a 30 pack-year smoking history. She has never been exposed to tobacco smoke. She has never used smokeless tobacco. She reports current alcohol use. She reports that she does not use drugs. family history includes Breast cancer in her sister; Colon cancer in her sister; Heart disease in her father; Hip fracture in her mother; Hypertension in an other family member. Allergies  Allergen Reactions   Doxycycline  Nausea And Vomiting   Current Outpatient Medications on File Prior to Visit  Medication Sig Dispense Refill   aspirin EC 325 MG tablet Take 325 mg by mouth daily.     desonide (DESOWEN) 0.05 % cream 1 application to affected area Externally Twice a day; Duration: 30 Days     No current facility-administered medications on file prior to visit.        ROS:  All others reviewed and negative.  Objective        PE:  BP (!) 148/96   Pulse 73   Temp 98.2 F (36.8 C)   Ht 5' 1 (1.549 m)   SpO2 94%   BMI 23.43 kg/m  Constitutional: Pt appears in NAD               HENT: Head: NCAT.                Right Ear: External ear normal.                 Left Ear: External ear normal.                Eyes: . Pupils are equal, round, and reactive to light. Conjunctivae and EOM are normal               Nose: without d/c or deformity               Neck: Neck supple. Gross normal ROM               Cardiovascular: Normal rate and regular rhythm.                 Pulmonary/Chest: Effort normal and breath sounds without rales or wheezing.                Abd:  Soft, NT, ND, + BS, no organomegaly               Neurological: Pt is alert. At baseline orientation, motor grossly intact               Skin: Skin is warm. No rashes, no other new lesions,  LE edema - trace to 1+ bilateral LE to knees right > left               Psychiatric: Pt behavior is normal without agitation   Micro: none  Cardiac tracings I have personally interpreted today:  none  Pertinent Radiological findings (summarize): none   Lab Results  Component Value Date   WBC 7.4 12/22/2023   HGB 12.0 12/22/2023   HCT 36.2 12/22/2023   PLT 189.0 12/22/2023   GLUCOSE 95 12/22/2023   CHOL 210 (H) 08/17/2023   TRIG 81.0 08/17/2023   HDL 62.60 08/17/2023   LDLDIRECT 142.7 11/10/2011   LDLCALC 131 (H) 08/17/2023   ALT 5 12/22/2023   AST 19 12/22/2023   NA 135 12/22/2023   K 4.2 12/22/2023   CL 99 12/22/2023   CREATININE 0.61 12/22/2023   BUN 21 12/22/2023   CO2 30 12/22/2023   TSH 1.04 12/22/2023   INR 0.97 04/18/2018   HGBA1C 5.7 12/22/2023   Assessment/Plan:  Theresa Arnold is a 89 y.o. Black or African American [2] female with  has a past medical history of CERVICAL RADICULOPATHY, LEFT (09/03/2010), Cervical spondylosis (11/10/2011), COPD (chronic obstructive pulmonary disease) (HCC) (11/10/2011), Diverticulitis, HTN (hypertension) (06/13/2021), HYPERLIPIDEMIA (09/03/2010), and Sigmoid stricture (HCC).  Vitamin D  deficiency Last vitamin D  Lab Results  Component Value Date   VD25OH 8.95 (L) 08/17/2023   Low, to start oral replacement   HTN (hypertension) BP Readings from Last 3 Encounters:  01/24/24 (!) 148/96  01/11/24 (!) 126/51  12/22/23 124/74   uncontrolled, likely reactive today, cont current med tx  Venous (peripheral) insufficiency Agree with change to lasix  20 mg every day to restart today, and for klor con 10 meq D   COPD (chronic obstructive pulmonary disease) (HCC) Stable, declines inhaler  Followup: Return in about 6 months (around 07/26/2024).  Lynwood Rush, MD 01/24/2024 2:47 PM [facility]

## 2024-01-24 NOTE — Assessment & Plan Note (Signed)
 BP Readings from Last 3 Encounters:  01/24/24 (!) 148/96  01/11/24 (!) 126/51  12/22/23 124/74   uncontrolled, likely reactive today, cont current med tx

## 2024-01-24 NOTE — Assessment & Plan Note (Signed)
 Last vitamin D  Lab Results  Component Value Date   VD25OH 8.95 (L) 08/17/2023   Low, to start oral replacement

## 2024-01-24 NOTE — Assessment & Plan Note (Signed)
 Agree with change to lasix  20 mg every day to restart today, and for klor con 10 meq D

## 2024-01-24 NOTE — Assessment & Plan Note (Signed)
 Stable, declines inhaler

## 2024-01-27 ENCOUNTER — Telehealth: Payer: Self-pay

## 2024-01-27 NOTE — Telephone Encounter (Signed)
 Copied from CRM [id number]. Topic: Clinical - Medication Question >> Jan 27, 2024  7:40 AM Robinson H wrote: Reason for CRM: Patient states provider was supposed to give her a prescription for her feet, legs and ankles are swollen and aching and pain at the visit on 7/14  [facility] [phone]

## 2024-01-27 NOTE — Telephone Encounter (Signed)
 Copied from CRM [id number]. Topic: Clinical - Medical Advice >> Jan 27, 2024  2:17 PM Chasity T wrote: Reason for CRM: patient is calling because she hasn't heard anything back from PCP regarding her concerns with swelling and aching pain in her legs feet and ankles. Please contact patient back per request from her.

## 2024-01-28 NOTE — Telephone Encounter (Signed)
 Called and spoke with Patient and patient's son. They informed me that swelling had gone down significantly since last seeing us  and they would inform us  if any adjustments were needed

## 2024-02-14 ENCOUNTER — Ambulatory Visit: Payer: Non-veteran care | Admitting: Internal Medicine

## 2024-02-17 DIAGNOSIS — J449 Chronic obstructive pulmonary disease, unspecified: Secondary | ICD-10-CM | POA: Diagnosis not present

## 2024-03-15 ENCOUNTER — Ambulatory Visit

## 2024-03-19 DIAGNOSIS — J449 Chronic obstructive pulmonary disease, unspecified: Secondary | ICD-10-CM | POA: Diagnosis not present

## 2024-04-17 ENCOUNTER — Encounter (HOSPITAL_COMMUNITY): Payer: Self-pay

## 2024-04-17 ENCOUNTER — Emergency Department (HOSPITAL_COMMUNITY)

## 2024-04-17 ENCOUNTER — Inpatient Hospital Stay (HOSPITAL_COMMUNITY)
Admission: EM | Admit: 2024-04-17 | Discharge: 2024-04-21 | DRG: 690 | Disposition: A | Discharge status: Skilled Nursing Facility | Attending: Internal Medicine | Admitting: Internal Medicine

## 2024-04-17 DIAGNOSIS — Z79899 Other long term (current) drug therapy: Secondary | ICD-10-CM | POA: Diagnosis not present

## 2024-04-17 DIAGNOSIS — R079 Chest pain, unspecified: Secondary | ICD-10-CM | POA: Diagnosis not present

## 2024-04-17 DIAGNOSIS — I82411 Acute embolism and thrombosis of right femoral vein: Secondary | ICD-10-CM | POA: Diagnosis present

## 2024-04-17 DIAGNOSIS — B964 Proteus (mirabilis) (morganii) as the cause of diseases classified elsewhere: Secondary | ICD-10-CM | POA: Diagnosis present

## 2024-04-17 DIAGNOSIS — Z9841 Cataract extraction status, right eye: Secondary | ICD-10-CM | POA: Diagnosis not present

## 2024-04-17 DIAGNOSIS — I959 Hypotension, unspecified: Secondary | ICD-10-CM | POA: Diagnosis present

## 2024-04-17 DIAGNOSIS — B9620 Unspecified Escherichia coli [E. coli] as the cause of diseases classified elsewhere: Secondary | ICD-10-CM | POA: Diagnosis present

## 2024-04-17 DIAGNOSIS — Z961 Presence of intraocular lens: Secondary | ICD-10-CM | POA: Diagnosis present

## 2024-04-17 DIAGNOSIS — Z7982 Long term (current) use of aspirin: Secondary | ICD-10-CM | POA: Diagnosis not present

## 2024-04-17 DIAGNOSIS — D649 Anemia, unspecified: Secondary | ICD-10-CM | POA: Diagnosis not present

## 2024-04-17 DIAGNOSIS — N12 Tubulo-interstitial nephritis, not specified as acute or chronic: Secondary | ICD-10-CM | POA: Diagnosis present

## 2024-04-17 DIAGNOSIS — N390 Urinary tract infection, site not specified: Secondary | ICD-10-CM | POA: Diagnosis not present

## 2024-04-17 DIAGNOSIS — Z87891 Personal history of nicotine dependence: Secondary | ICD-10-CM | POA: Diagnosis not present

## 2024-04-17 DIAGNOSIS — J449 Chronic obstructive pulmonary disease, unspecified: Secondary | ICD-10-CM | POA: Diagnosis present

## 2024-04-17 DIAGNOSIS — I700 Atherosclerosis of aorta: Secondary | ICD-10-CM | POA: Diagnosis present

## 2024-04-17 DIAGNOSIS — M79661 Pain in right lower leg: Secondary | ICD-10-CM | POA: Diagnosis not present

## 2024-04-17 DIAGNOSIS — Z1152 Encounter for screening for COVID-19: Secondary | ICD-10-CM

## 2024-04-17 DIAGNOSIS — L89316 Pressure-induced deep tissue damage of right buttock: Secondary | ICD-10-CM | POA: Diagnosis present

## 2024-04-17 DIAGNOSIS — Z86718 Personal history of other venous thrombosis and embolism: Secondary | ICD-10-CM | POA: Diagnosis not present

## 2024-04-17 DIAGNOSIS — F0390 Unspecified dementia without behavioral disturbance: Secondary | ICD-10-CM | POA: Diagnosis present

## 2024-04-17 DIAGNOSIS — I824Z1 Acute embolism and thrombosis of unspecified deep veins of right distal lower extremity: Secondary | ICD-10-CM | POA: Diagnosis present

## 2024-04-17 DIAGNOSIS — Z7901 Long term (current) use of anticoagulants: Secondary | ICD-10-CM | POA: Diagnosis not present

## 2024-04-17 DIAGNOSIS — D696 Thrombocytopenia, unspecified: Secondary | ICD-10-CM | POA: Diagnosis present

## 2024-04-17 DIAGNOSIS — I10 Essential (primary) hypertension: Secondary | ICD-10-CM | POA: Diagnosis present

## 2024-04-17 DIAGNOSIS — E785 Hyperlipidemia, unspecified: Secondary | ICD-10-CM | POA: Diagnosis present

## 2024-04-17 DIAGNOSIS — R1031 Right lower quadrant pain: Secondary | ICD-10-CM | POA: Diagnosis not present

## 2024-04-17 DIAGNOSIS — E872 Acidosis, unspecified: Secondary | ICD-10-CM | POA: Diagnosis present

## 2024-04-17 DIAGNOSIS — Z8249 Family history of ischemic heart disease and other diseases of the circulatory system: Secondary | ICD-10-CM | POA: Diagnosis not present

## 2024-04-17 DIAGNOSIS — D259 Leiomyoma of uterus, unspecified: Secondary | ICD-10-CM | POA: Diagnosis not present

## 2024-04-17 DIAGNOSIS — R10A1 Flank pain, right side: Secondary | ICD-10-CM | POA: Diagnosis not present

## 2024-04-17 DIAGNOSIS — Z9842 Cataract extraction status, left eye: Secondary | ICD-10-CM

## 2024-04-17 DIAGNOSIS — Z881 Allergy status to other antibiotic agents status: Secondary | ICD-10-CM

## 2024-04-17 DIAGNOSIS — R109 Unspecified abdominal pain: Secondary | ICD-10-CM | POA: Diagnosis not present

## 2024-04-17 DIAGNOSIS — I77819 Aortic ectasia, unspecified site: Secondary | ICD-10-CM | POA: Diagnosis not present

## 2024-04-17 DIAGNOSIS — M79604 Pain in right leg: Secondary | ICD-10-CM | POA: Diagnosis not present

## 2024-04-17 DIAGNOSIS — M545 Low back pain, unspecified: Secondary | ICD-10-CM | POA: Diagnosis not present

## 2024-04-17 DIAGNOSIS — L89326 Pressure-induced deep tissue damage of left buttock: Secondary | ICD-10-CM | POA: Diagnosis present

## 2024-04-17 DIAGNOSIS — N10 Acute tubulo-interstitial nephritis: Secondary | ICD-10-CM | POA: Diagnosis not present

## 2024-04-17 DIAGNOSIS — Z7401 Bed confinement status: Secondary | ICD-10-CM | POA: Diagnosis not present

## 2024-04-17 LAB — COMPREHENSIVE METABOLIC PANEL WITH GFR
ALT: <5 U/L (ref 0–44)
AST: 19 U/L (ref 15–41)
Albumin: 3.8 g/dL (ref 3.5–5.0)
Alkaline Phosphatase: 63 U/L (ref 38–126)
Anion gap: 13 (ref 5–15)
BUN: 12 mg/dL (ref 8–23)
CO2: 23 mmol/L (ref 22–32)
Calcium: 9.7 mg/dL (ref 8.9–10.3)
Chloride: 99 mmol/L (ref 98–111)
Creatinine, Ser: 0.73 mg/dL (ref 0.44–1.00)
GFR, Estimated: >60 mL/min (ref >60)
Glucose, Bld: 108 mg/dL — ABNORMAL HIGH (ref 70–99)
Potassium: 4.2 mmol/L (ref 3.5–5.1)
Sodium: 136 mmol/L (ref 135–145)
Total Bilirubin: 0.9 mg/dL (ref 0.0–1.2)
Total Protein: 7.4 g/dL (ref 6.5–8.1)

## 2024-04-17 LAB — CBC WITH DIFFERENTIAL/PLATELET
Abs Immature Granulocytes: 0.04 K/uL (ref 0.00–0.07)
Basophils Absolute: 0 K/uL (ref 0.0–0.1)
Basophils Relative: 0 %
Eosinophils Absolute: 0 K/uL (ref 0.0–0.5)
Eosinophils Relative: 0 %
HCT: 44.9 % (ref 36.0–46.0)
Hemoglobin: 14 g/dL (ref 12.0–15.0)
Immature Granulocytes: 0 %
Lymphocytes Relative: 14 %
Lymphs Abs: 1.4 K/uL (ref 0.7–4.0)
MCH: 29.6 pg (ref 26.0–34.0)
MCHC: 31.2 g/dL (ref 30.0–36.0)
MCV: 94.9 fL (ref 80.0–100.0)
Monocytes Absolute: 0.5 K/uL (ref 0.1–1.0)
Monocytes Relative: 5 %
Neutro Abs: 8.2 K/uL — ABNORMAL HIGH (ref 1.7–7.7)
Neutrophils Relative %: 81 %
Platelets: 138 K/uL — ABNORMAL LOW (ref 150–400)
RBC: 4.73 MIL/uL (ref 3.87–5.11)
RDW: 13.5 % (ref 11.5–15.5)
WBC: 10.2 K/uL (ref 4.0–10.5)
nRBC: 0 % (ref 0.0–0.2)

## 2024-04-17 LAB — I-STAT CHEM 8, ED
BUN: 13 mg/dL (ref 8–23)
Calcium, Ion: 1.14 mmol/L — ABNORMAL LOW (ref 1.15–1.40)
Chloride: 102 mmol/L (ref 98–111)
Creatinine, Ser: 0.7 mg/dL (ref 0.44–1.00)
Glucose, Bld: 135 mg/dL — ABNORMAL HIGH (ref 70–99)
HCT: 40 % (ref 36.0–46.0)
Hemoglobin: 13.6 g/dL (ref 12.0–15.0)
Potassium: 3.5 mmol/L (ref 3.5–5.1)
Sodium: 139 mmol/L (ref 135–145)
TCO2: 24 mmol/L (ref 22–32)

## 2024-04-17 LAB — TROPONIN T, HIGH SENSITIVITY: Troponin T High Sensitivity: 17 ng/L (ref 0–19)

## 2024-04-17 MED ORDER — SODIUM CHLORIDE (PF) 0.9 % IJ SOLN
INTRAMUSCULAR | Status: AC
Start: 2024-04-17 — End: 2024-04-17
  Filled 2024-04-17: qty 50

## 2024-04-17 MED ORDER — SODIUM CHLORIDE 0.9 % IV BOLUS
500.0000 mL | Freq: Once | INTRAVENOUS | Status: AC
  Administered 2024-04-17: 500 mL via INTRAVENOUS

## 2024-04-17 MED ORDER — BREXPIPRAZOLE 1 MG PO TABS
0.5000 mg | ORAL_TABLET | Freq: Two times a day (BID) | ORAL | Status: DC
  Administered 2024-04-17 – 2024-04-21 (×8): 0.5 mg via ORAL
  Filled 2024-04-17 (×8): qty 0.5

## 2024-04-17 MED ORDER — ACETAMINOPHEN 325 MG PO TABS
650.0000 mg | ORAL_TABLET | Freq: Four times a day (QID) | ORAL | Status: DC | PRN
  Administered 2024-04-17 – 2024-04-18 (×2): 650 mg via ORAL
  Filled 2024-04-17 (×2): qty 2

## 2024-04-17 MED ORDER — RIVAROXABAN 10 MG PO TABS: 20.0000 mg | ORAL_TABLET | Freq: Every day | ORAL | Status: DC

## 2024-04-17 MED ORDER — SODIUM CHLORIDE 0.9 % IV SOLN: INTRAVENOUS | Status: DC

## 2024-04-17 MED ORDER — RIVAROXABAN 15 MG PO TABS: 15.0000 mg | ORAL_TABLET | Freq: Two times a day (BID) | ORAL | Status: DC

## 2024-04-17 MED ORDER — POTASSIUM CHLORIDE ER 10 MEQ PO TBCR
10.0000 meq | EXTENDED_RELEASE_TABLET | Freq: Every day | ORAL | Status: DC
  Administered 2024-04-17 – 2024-04-21 (×5): 10 meq via ORAL
  Filled 2024-04-17 (×9): qty 1

## 2024-04-17 MED ORDER — IOHEXOL 300 MG/ML  SOLN
100.0000 mL | Freq: Once | INTRAMUSCULAR | Status: AC | PRN
  Administered 2024-04-17: 100 mL via INTRAVENOUS

## 2024-04-17 MED ORDER — FUROSEMIDE 20 MG PO TABS
20.0000 mg | ORAL_TABLET | Freq: Every day | ORAL | Status: DC
  Administered 2024-04-17 – 2024-04-18 (×2): 20 mg via ORAL
  Filled 2024-04-17 (×2): qty 1

## 2024-04-17 MED ORDER — RIVAROXABAN 15 MG PO TABS
15.0000 mg | ORAL_TABLET | Freq: Two times a day (BID) | ORAL | Status: DC
  Administered 2024-04-17 – 2024-04-21 (×8): 15 mg via ORAL
  Filled 2024-04-17 (×9): qty 1

## 2024-04-17 NOTE — ED Notes (Signed)
 Called phlebotomy again and lab for light green redraw.

## 2024-04-17 NOTE — ED Notes (Signed)
 Inc care and full linen change

## 2024-04-17 NOTE — ED Notes (Signed)
 Phlebotomy called.

## 2024-04-17 NOTE — ED Provider Notes (Signed)
 [facility] AT [facility] Provider Note   CSN: [id number] Arrival date & time: 04/17/24  1156     Patient presents with: Flank Pain and Leg Pain   Theresa Arnold is a 89 y.o. female.   Patient has a history of COPD, hypertension and dementia.  She complains of pain in her right side of her chest and weakness and son says she cannot seem to get around anymore and needs to go to a nursing home  The history is provided by the patient and medical records. No language interpreter was used.  Flank Pain This is a new problem. The problem occurs constantly. The problem has not changed since onset.Associated symptoms include chest pain. Pertinent negatives include no abdominal pain and no headaches. Nothing aggravates the symptoms. Nothing relieves the symptoms. She has tried nothing for the symptoms.  Leg Pain Associated symptoms: no back pain and no fatigue        Prior to Admission medications   Medication Sig Start Date End Date Taking? Authorizing Provider  aspirin EC 325 MG tablet Take 325 mg by mouth daily.    [provider]  Brexpiprazole  (REXULTI ) 0.5 MG TABS 1 tab by mouth twice per day 01/24/24   Norleen Lynwood ORN, MD  desonide (DESOWEN) 0.05 % cream 1 application to affected area Externally Twice a day; Duration: 30 Days 05/22/14   [provider]  furosemide  (LASIX ) 20 MG tablet Take 1 tablet (20 mg total) by mouth daily for 5 days. 01/24/24 01/29/24  Norleen Lynwood ORN, MD  potassium chloride  (KLOR-CON  10) 10 MEQ tablet Take 1 tablet (10 mEq total) by mouth daily. 01/24/24   Norleen Lynwood ORN, MD    Allergies: Doxycycline     Review of Systems  Constitutional:  Negative for appetite change and fatigue.  HENT:  Negative for congestion, ear discharge and sinus pressure.   Eyes:  Negative for discharge.  Respiratory:  Negative for cough.   Cardiovascular:  Positive for chest pain.  Gastrointestinal:  Negative for abdominal pain and  diarrhea.  Genitourinary:  Positive for flank pain. Negative for frequency and hematuria.  Musculoskeletal:  Negative for back pain.  Skin:  Negative for rash.  Neurological:  Negative for seizures and headaches.  Psychiatric/Behavioral:  Negative for hallucinations.     Updated Vital Signs BP (!) 136/54 (BP Location: Left Arm)   Pulse 86   Temp 97.7 F (36.5 C) (Oral)   Resp 16   Ht 5' 1 (1.549 m)   Wt 56.2 kg   SpO2 94%   BMI 23.41 kg/m   Physical Exam Vitals and nursing note reviewed.  Constitutional:      Appearance: She is well-developed.  HENT:     Head: Normocephalic.     Nose: Nose normal.  Eyes:     General: No scleral icterus.    Conjunctiva/sclera: Conjunctivae normal.  Neck:     Thyroid : No thyromegaly.  Cardiovascular:     Rate and Rhythm: Normal rate and regular rhythm.     Heart sounds: No murmur heard.    No friction rub. No gallop.  Pulmonary:     Breath sounds: No stridor. No wheezing or rales.  Chest:     Chest wall: Tenderness present.  Abdominal:     General: There is no distension.     Tenderness: There is no abdominal tenderness. There is no rebound.  Musculoskeletal:        General: Normal range of motion.  Cervical back: Neck supple.  Lymphadenopathy:     Cervical: No cervical adenopathy.  Skin:    Findings: No erythema or rash.  Neurological:     Mental Status: She is alert and oriented to person, place, and time.     Motor: No abnormal muscle tone.     Coordination: Coordination normal.  Psychiatric:        Behavior: Behavior normal.     (all labs ordered are listed, but only abnormal results are displayed) Labs Reviewed  CBC WITH DIFFERENTIAL/PLATELET - Abnormal; Notable for the following components:      Result Value   Platelets 138 (*)    Neutro Abs 8.2 (*)    All other components within normal limits  I-STAT CHEM 8, ED - Abnormal; Notable for the following components:   Glucose, Bld 135 (*)    Calcium , Ion 1.14 (*)     All other components within normal limits  COMPREHENSIVE METABOLIC PANEL WITH GFR  URINALYSIS, ROUTINE W REFLEX MICROSCOPIC    EKG: None  Radiology: DG Chest Port 1 View Result Date: 04/17/2024 EXAM: 1 VIEW(S) XRAY OF THE CHEST 04/17/2024 12:47:23 PM COMPARISON: 11/26/2023 CLINICAL HISTORY: pain. pain pain. pain FINDINGS: LUNGS AND PLEURA: No focal pulmonary opacity. No pulmonary edema. No pleural effusion. No pneumothorax. HEART AND MEDIASTINUM: Ectasia aorta. No acute abnormality of the cardiac and mediastinal silhouettes. BONES AND SOFT TISSUES: No acute osseous abnormality. IMPRESSION: 1. No acute cardiopulmonary process. 2. Aortic ectasia. Electronically signed by: Norleen Boxer MD 04/17/2024 01:31 PM EDT RP Workstation: [id number]     Procedures   Medications Ordered in the ED - No data to display                                  Medical Decision Making Amount and/or Complexity of Data Reviewed Labs: ordered. Radiology: ordered. ECG/medicine tests: ordered.  Risk Prescription drug management.   Patient with dementia and weakness along with right flank pain and lower right chest pain CT scan pending.  Disposition will be determined by Dr. Zackowski     Final diagnoses:  None    ED Discharge Orders     None          Suzette Pac, MD 04/17/24 1615

## 2024-04-17 NOTE — ED Notes (Signed)
 MD notified of BP. MD is in route

## 2024-04-17 NOTE — ED Notes (Signed)
 Phlebotomy came and drew trop. They reported they are canceling the 2nd trop. MD is aware

## 2024-04-17 NOTE — ED Notes (Signed)
 Pt bed and person changed

## 2024-04-17 NOTE — NC FL2 (Signed)
 [place]  MEDICAID FL2 LEVEL OF CARE FORM     IDENTIFICATION  Patient Name: Theresa Arnold Birthdate: 1931/08/11 Sex: female Admission Date (Current Location): 04/17/2024  [facility] and [place] Number:  Producer, television/film/video and Address:  [facility],  [address], [place] [address]      Provider Number:    Attending Physician Name and Address:  Geraldene Hamilton, MD  Relative Name and Phone Number:  Fiorela, Pelzer (Son)  [phone]    Current Level of Care: Hospital Recommended Level of Care: Skilled Nursing Facility Prior Approval Number:    Date Approved/Denied:   PASRR Number: [id number]  Discharge Plan: SNF    Current Diagnoses: Patient Active Problem List   Diagnosis Date Noted   Frailty syndrome in geriatric patient 12/22/2023   Insomnia 12/05/2023   Weight loss 12/05/2023   Encounter for well adult exam with abnormal findings 08/21/2023   Acne vulgaris 08/17/2023   Seborrheic dermatitis 08/17/2023   Lump in neck 08/13/2022   Vitamin D  deficiency 08/13/2022   HTN (hypertension) 06/13/2021   Other chronic pain 08/15/2020   Gait disorder 08/15/2020   Generalized weakness 08/15/2020   Hyperglycemia 08/02/2020   Ileus, postoperative (HCC) 08/02/2020   Macrocytic anemia 08/02/2020   Hypophosphatemia 08/02/2020   Hypomagnesemia 08/02/2020   Physical deconditioning 08/02/2020   Protein calorie malnutrition 08/02/2020   Thrombocytopenia 07/20/2020   Hypokalemia 07/20/2020   S/P colostomy takedown 09/04/2019   Colonic partial obstruction from sigmoid stricture 03/18/2019   Polyp of sigmoid colon 03/18/2019   Abnormal CT scan, gastrointestinal tract    Abnormal CT scan, sigmoid colon    Diverticulitis of sigmoid colon 03/15/2019   Venous (peripheral) insufficiency 01/12/2014   Preventative health care 11/10/2011   Cervical spondylosis 11/10/2011   OA (osteoarthritis) of knee 11/10/2011   COPD (chronic obstructive pulmonary disease)  (HCC) 11/10/2011   Dizziness 11/10/2011   Benign neoplasm of colon 11/24/2010   Diverticulosis of colon 11/24/2010   Encounter for long-term (current) use of other medications 10/07/2010   HLD (hyperlipidemia) 09/03/2010   Debility 09/03/2010   SMOKER 09/10/2009    Orientation RESPIRATION BLADDER Height & Weight     Self, Place, Time  Normal Continent Weight: 123 lb 14.4 oz (56.2 kg) Height:  5' 1 (154.9 cm)  BEHAVIORAL SYMPTOMS/MOOD NEUROLOGICAL BOWEL NUTRITION STATUS      Continent Diet (Regular)  AMBULATORY STATUS COMMUNICATION OF NEEDS Skin   Extensive Assist Verbally Normal                       Personal Care Assistance Level of Assistance  Bathing, Feeding, Dressing Bathing Assistance: Maximum assistance Feeding assistance: Independent Dressing Assistance: Maximum assistance     Functional Limitations Info  Sight, Hearing, Speech Sight Info: Adequate Hearing Info: Adequate Speech Info: Adequate    SPECIAL CARE FACTORS FREQUENCY       PT 5x weekly                Contractures      Additional Factors Info  Allergies, Code Status Code Status Info: Full code Allergies Info: Doxycycline            Current Medications (04/17/2024):  This is the current hospital active medication list No current facility-administered medications for this encounter.   Current Outpatient Medications  Medication Sig Dispense Refill   aspirin EC 325 MG tablet Take 325 mg by mouth daily.     Brexpiprazole  (REXULTI ) 0.5 MG TABS 1 tab by mouth  twice per day 60 tablet 5   desonide (DESOWEN) 0.05 % cream 1 application to affected area Externally Twice a day; Duration: 30 Days     furosemide  (LASIX ) 20 MG tablet Take 1 tablet (20 mg total) by mouth daily for 5 days. 90 tablet 3   potassium chloride  (KLOR-CON  10) 10 MEQ tablet Take 1 tablet (10 mEq total) by mouth daily. 90 tablet 3     Discharge Medications: Please see discharge summary for a list of discharge  medications.  Relevant Imaging Results:  Relevant Lab Results:   Additional Information [id number]  Clotilda Brenner, LCSWA

## 2024-04-17 NOTE — ED Triage Notes (Signed)
 Pt BIB EMS with rt flank and leg pain. Baseline dementia AxOx3, complaining of pain and unable to walk. 4/10 pain. No recent falls since April. POA son  Bp: 122/56 Pulse: 90 R: 16 O2: 98 RA CGB: 137

## 2024-04-17 NOTE — ED Notes (Signed)
 RN made aware of pt's BP

## 2024-04-17 NOTE — TOC Initial Note (Signed)
 Transition of Care [facility]) - Initial/Assessment Note    Patient Details  Name: Theresa Arnold MRN: [id number] Date of Birth: 19-Jan-1932  [facility] of [facility]) CM/SW Contact:    Theresa Arnold, LCSWA Phone Number: 04/17/2024, 6:34 PM  Clinical Narrative:     CSW spoke with patients son Theresa Arnold who stated that patient was able to complete her ADLs independently, but recently patient has needed more assistance. CSW was told that patient was previously at [facility] in April and patients son would like her to return. CSW explained PT consult is pending for recommendations. CSW told patients son if patient is recommended for SNF that CSW cannot guarantee placement at [facility]. Patients son was open to other facilities.    Expected Discharge Plan: Skilled Nursing Facility Barriers to Discharge: Continued Medical Work up   Patient Goals and CMS Choice Patient states their goals for this hospitalization and ongoing recovery are:: Patient's son would like patient to go back to [facility] for SNF placement          Expected Discharge Plan and Services                                              Prior Living Arrangements/Services     Patient language and need for interpreter reviewed:: Yes        Need for Family Participation in Patient Care: Yes (Comment) Care giver support system in place?: Yes (comment)   Criminal Activity/Legal Involvement Pertinent to Current Situation/Hospitalization: No - Comment as needed  Activities of Daily Living      Permission Sought/Granted                  Emotional Assessment       Orientation: : Oriented to Self, Oriented to Place, Oriented to  Time Alcohol / Substance Use: Not Applicable Psych Involvement: No (comment)  Admission diagnosis:  right flank pain Patient Active Problem List   Diagnosis Date Noted   Frailty syndrome in geriatric patient 12/22/2023   Insomnia 12/05/2023   Weight loss 12/05/2023    Encounter for well adult exam with abnormal findings 08/21/2023   Acne vulgaris 08/17/2023   Seborrheic dermatitis 08/17/2023   Lump in neck 08/13/2022   Vitamin D  deficiency 08/13/2022   HTN (hypertension) 06/13/2021   Other chronic pain 08/15/2020   Gait disorder 08/15/2020   Generalized weakness 08/15/2020   Hyperglycemia 08/02/2020   Ileus, postoperative (HCC) 08/02/2020   Macrocytic anemia 08/02/2020   Hypophosphatemia 08/02/2020   Hypomagnesemia 08/02/2020   Physical deconditioning 08/02/2020   Protein calorie malnutrition 08/02/2020   Thrombocytopenia 07/20/2020   Hypokalemia 07/20/2020   S/P colostomy takedown 09/04/2019   Colonic partial obstruction from sigmoid stricture 03/18/2019   Polyp of sigmoid colon 03/18/2019   Abnormal CT scan, gastrointestinal tract    Abnormal CT scan, sigmoid colon    Diverticulitis of sigmoid colon 03/15/2019   Venous (peripheral) insufficiency 01/12/2014   Preventative health care 11/10/2011   Cervical spondylosis 11/10/2011   OA (osteoarthritis) of knee 11/10/2011   COPD (chronic obstructive pulmonary disease) (HCC) 11/10/2011   Dizziness 11/10/2011   Benign neoplasm of colon 11/24/2010   Diverticulosis of colon 11/24/2010   Encounter for long-term (current) use of other medications 10/07/2010   HLD (hyperlipidemia) 09/03/2010   Debility 09/03/2010   SMOKER 09/10/2009   PCP:  Theresa Arnold ORN, MD  Pharmacy:   [id number] [place] [place], [place] - [address] [place] [address] Phone: [phone] Fax: [phone]     Social Drivers of Health (SDOH) Social History: SDOH Screenings   Depression (PHQ2-9): Low Risk  (12/03/2023)  Tobacco Use: Medium Risk (04/17/2024)   SDOH Interventions:     Readmission Risk Interventions     No data to display

## 2024-04-17 NOTE — Progress Notes (Signed)
 Right lower extremity venous duplex has been completed.  Results can be found in chart review under CV Proc.  04/17/2024 6:08 PM  Conna Terada Elden Appl, RVT.

## 2024-04-17 NOTE — ED Provider Notes (Addendum)
 Patient turned over to me awaiting placement because family unable to care for her at home anymore.  CT scan abdomen and pelvis was done and that raise some concerns about a right femoral vein DVT.  This led to Doppler study of the right lower extremity which is positive for deep vein thrombosis right femoral vein but at age indeterminate.  Based on that and looking for nursing home placement we will go ahead and start her on Xarelto.  Patient does not have any history of any significant bleeding.  The CT scan abdomen pelvis had no other acute findings.  Patient CBC white count 10.2 hemoglobin 14 platelets 138.  I-STAT CHEM sodium 139 potassium 3.5 chloride 102 BUN 13 creatinine 0.7.  Those are all very reassuring.  I have already had social worker involved.  Will check to see if physical therapy evaluations been ordered.  If not we will order.  Will also make sure her home meds have been ordered.  And will begin the Xarelto at 15 mg twice daily.   Theresa Gemme, MD 04/17/24 [phone]  Patient started on the Xarelto.  We noted that blood pressures have been down a little bit really her systolics have been 90 or better but her maps were a little low because the diastolic being low.  Patient clinically is unchanged.  No complaints no fever not tachycardic oxygen sats are good.  Most recent blood pressure was like 104 systolic.  No chest pain no shortness of breath no tachypnea.  So no concerns about PE because we know she has got the DVT.  Will go ahead and give her a 500 cc bolus of fluids and give her 75 cc an hour and have the nurse keep eye on her.  If her pressures become a little bit borderline unstable.  May require medicine admission but does not seem to at that point right now.   Theresa Nieland, MD 04/17/24 2252

## 2024-04-18 ENCOUNTER — Other Ambulatory Visit: Payer: Self-pay

## 2024-04-18 DIAGNOSIS — Z7901 Long term (current) use of anticoagulants: Secondary | ICD-10-CM | POA: Diagnosis not present

## 2024-04-18 DIAGNOSIS — Z79899 Other long term (current) drug therapy: Secondary | ICD-10-CM | POA: Diagnosis not present

## 2024-04-18 DIAGNOSIS — Z8249 Family history of ischemic heart disease and other diseases of the circulatory system: Secondary | ICD-10-CM | POA: Diagnosis not present

## 2024-04-18 DIAGNOSIS — I824Z1 Acute embolism and thrombosis of unspecified deep veins of right distal lower extremity: Secondary | ICD-10-CM | POA: Diagnosis present

## 2024-04-18 DIAGNOSIS — E872 Acidosis, unspecified: Secondary | ICD-10-CM | POA: Diagnosis present

## 2024-04-18 DIAGNOSIS — L89316 Pressure-induced deep tissue damage of right buttock: Secondary | ICD-10-CM | POA: Diagnosis present

## 2024-04-18 DIAGNOSIS — Z961 Presence of intraocular lens: Secondary | ICD-10-CM | POA: Diagnosis present

## 2024-04-18 DIAGNOSIS — N12 Tubulo-interstitial nephritis, not specified as acute or chronic: Principal | ICD-10-CM | POA: Diagnosis present

## 2024-04-18 DIAGNOSIS — Z9841 Cataract extraction status, right eye: Secondary | ICD-10-CM | POA: Diagnosis not present

## 2024-04-18 DIAGNOSIS — E785 Hyperlipidemia, unspecified: Secondary | ICD-10-CM | POA: Diagnosis present

## 2024-04-18 DIAGNOSIS — B964 Proteus (mirabilis) (morganii) as the cause of diseases classified elsewhere: Secondary | ICD-10-CM | POA: Diagnosis present

## 2024-04-18 DIAGNOSIS — Z87891 Personal history of nicotine dependence: Secondary | ICD-10-CM | POA: Diagnosis not present

## 2024-04-18 DIAGNOSIS — I10 Essential (primary) hypertension: Secondary | ICD-10-CM | POA: Diagnosis present

## 2024-04-18 DIAGNOSIS — I82411 Acute embolism and thrombosis of right femoral vein: Secondary | ICD-10-CM | POA: Diagnosis present

## 2024-04-18 DIAGNOSIS — Z86718 Personal history of other venous thrombosis and embolism: Secondary | ICD-10-CM | POA: Diagnosis not present

## 2024-04-18 DIAGNOSIS — I700 Atherosclerosis of aorta: Secondary | ICD-10-CM | POA: Diagnosis present

## 2024-04-18 DIAGNOSIS — D696 Thrombocytopenia, unspecified: Secondary | ICD-10-CM | POA: Diagnosis present

## 2024-04-18 DIAGNOSIS — Z1152 Encounter for screening for COVID-19: Secondary | ICD-10-CM | POA: Diagnosis not present

## 2024-04-18 DIAGNOSIS — I959 Hypotension, unspecified: Secondary | ICD-10-CM | POA: Diagnosis present

## 2024-04-18 DIAGNOSIS — L89326 Pressure-induced deep tissue damage of left buttock: Secondary | ICD-10-CM | POA: Diagnosis present

## 2024-04-18 DIAGNOSIS — I82401 Acute embolism and thrombosis of unspecified deep veins of right lower extremity: Secondary | ICD-10-CM

## 2024-04-18 DIAGNOSIS — F0390 Unspecified dementia without behavioral disturbance: Secondary | ICD-10-CM | POA: Diagnosis present

## 2024-04-18 DIAGNOSIS — J449 Chronic obstructive pulmonary disease, unspecified: Secondary | ICD-10-CM | POA: Diagnosis present

## 2024-04-18 DIAGNOSIS — Z7982 Long term (current) use of aspirin: Secondary | ICD-10-CM | POA: Diagnosis not present

## 2024-04-18 DIAGNOSIS — B9620 Unspecified Escherichia coli [E. coli] as the cause of diseases classified elsewhere: Secondary | ICD-10-CM | POA: Diagnosis present

## 2024-04-18 LAB — URINALYSIS, W/ REFLEX TO CULTURE (INFECTION SUSPECTED)
Bilirubin Urine: NEGATIVE
Glucose, UA: NEGATIVE mg/dL
Ketones, ur: 5 mg/dL — AB
Nitrite: POSITIVE — AB
Protein, ur: NEGATIVE mg/dL
Specific Gravity, Urine: 1.03 (ref 1.005–1.030)
WBC, UA: >50 WBC/hpf (ref 0–5)
pH: 5 (ref 5.0–8.0)

## 2024-04-18 LAB — BASIC METABOLIC PANEL WITH GFR
Anion gap: 12 (ref 5–15)
BUN: 12 mg/dL (ref 8–23)
CO2: 20 mmol/L — ABNORMAL LOW (ref 22–32)
Calcium: 8.4 mg/dL — ABNORMAL LOW (ref 8.9–10.3)
Chloride: 106 mmol/L (ref 98–111)
Creatinine, Ser: 0.62 mg/dL (ref 0.44–1.00)
GFR, Estimated: >60 mL/min (ref >60)
Glucose, Bld: 86 mg/dL (ref 70–99)
Potassium: 3.6 mmol/L (ref 3.5–5.1)
Sodium: 138 mmol/L (ref 135–145)

## 2024-04-18 LAB — CBC
HCT: 35.2 % — ABNORMAL LOW (ref 36.0–46.0)
Hemoglobin: 11.1 g/dL — ABNORMAL LOW (ref 12.0–15.0)
MCH: 31 pg (ref 26.0–34.0)
MCHC: 31.5 g/dL (ref 30.0–36.0)
MCV: 98.3 fL (ref 80.0–100.0)
Platelets: 121 K/uL — ABNORMAL LOW (ref 150–400)
RBC: 3.58 MIL/uL — ABNORMAL LOW (ref 3.87–5.11)
RDW: 13.7 % (ref 11.5–15.5)
WBC: 9.7 K/uL (ref 4.0–10.5)
nRBC: 0 % (ref 0.0–0.2)

## 2024-04-18 LAB — URINALYSIS, ROUTINE W REFLEX MICROSCOPIC
Bilirubin Urine: NEGATIVE
Glucose, UA: NEGATIVE mg/dL
Ketones, ur: 5 mg/dL — AB
Nitrite: POSITIVE — AB
Protein, ur: NEGATIVE mg/dL
Specific Gravity, Urine: 1.044 — ABNORMAL HIGH (ref 1.005–1.030)
pH: 5 (ref 5.0–8.0)

## 2024-04-18 LAB — PHOSPHORUS: Phosphorus: 3.5 mg/dL (ref 2.5–4.6)

## 2024-04-18 LAB — I-STAT CG4 LACTIC ACID, ED: Lactic Acid, Venous: 0.7 mmol/L (ref 0.5–1.9)

## 2024-04-18 LAB — MAGNESIUM: Magnesium: 2 mg/dL (ref 1.7–2.4)

## 2024-04-18 MED ORDER — MELATONIN 5 MG PO TABS
5.0000 mg | ORAL_TABLET | Freq: Every evening | ORAL | Status: DC | PRN
  Administered 2024-04-19: 5 mg via ORAL
  Filled 2024-04-18: qty 1

## 2024-04-18 MED ORDER — OXYCODONE HCL 5 MG PO TABS
5.0000 mg | ORAL_TABLET | Freq: Four times a day (QID) | ORAL | Status: DC | PRN
  Administered 2024-04-18 – 2024-04-19 (×2): 5 mg via ORAL
  Filled 2024-04-18 (×2): qty 1

## 2024-04-18 MED ORDER — OXYCODONE HCL 5 MG PO TABS
5.0000 mg | ORAL_TABLET | Freq: Four times a day (QID) | ORAL | Status: AC | PRN
  Administered 2024-04-18 (×2): 5 mg via ORAL
  Filled 2024-04-18 (×2): qty 1

## 2024-04-18 MED ORDER — CEFTRIAXONE SODIUM 2 G IJ SOLR
2.0000 g | INTRAMUSCULAR | Status: DC
  Administered 2024-04-19 – 2024-04-21 (×3): 2 g via INTRAVENOUS
  Filled 2024-04-18 (×3): qty 20

## 2024-04-18 MED ORDER — SODIUM CHLORIDE 0.9 % IV SOLN
1.0000 g | Freq: Once | INTRAVENOUS | Status: AC
  Administered 2024-04-18: 1 g via INTRAVENOUS
  Filled 2024-04-18: qty 10

## 2024-04-18 MED ORDER — SODIUM CHLORIDE 0.9 % IV BOLUS
1000.0000 mL | Freq: Once | INTRAVENOUS | Status: AC
  Administered 2024-04-18: 1000 mL via INTRAVENOUS

## 2024-04-18 MED ORDER — PROCHLORPERAZINE EDISYLATE 10 MG/2ML IJ SOLN: 5.0000 mg | Freq: Four times a day (QID) | INTRAMUSCULAR | Status: DC | PRN

## 2024-04-18 MED ORDER — CEPHALEXIN 500 MG PO CAPS: 500.0000 mg | ORAL_CAPSULE | Freq: Three times a day (TID) | ORAL | Status: DC

## 2024-04-18 MED ORDER — ACETAMINOPHEN 325 MG PO TABS: 650.0000 mg | ORAL_TABLET | Freq: Four times a day (QID) | ORAL | Status: DC | PRN

## 2024-04-18 MED ORDER — POLYETHYLENE GLYCOL 3350 17 G PO PACK
17.0000 g | PACK | Freq: Every day | ORAL | Status: DC | PRN
Start: 2024-04-18 — End: 2024-04-21
  Administered 2024-04-20: 17 g via ORAL
  Filled 2024-04-18: qty 1

## 2024-04-18 MED ORDER — ACETAMINOPHEN 325 MG PO TABS
650.0000 mg | ORAL_TABLET | Freq: Once | ORAL | Status: AC
  Administered 2024-04-18: 650 mg via ORAL
  Filled 2024-04-18: qty 2

## 2024-04-18 MED ORDER — CEPHALEXIN 500 MG PO CAPS: 500.0000 mg | ORAL_CAPSULE | Freq: Once | ORAL | Status: DC

## 2024-04-18 NOTE — Evaluation (Signed)
 Physical Therapy Evaluation Patient Details Name: Theresa Arnold MRN: [id number] DOB: 01-04-1932 Today's Date: 04/18/2024  History of Present Illness  89 yo female presents to therapy following hospital admission on 04/17/2024 due to LE and R flank pain impacting pt ability to ambulate. Pt was found to have R LE DVT and pyelonephritis/UTI. Pt PMH includes but is not limited to: COPD, falls, cervical spondylosis with radiculopathy, HTN, diverticulitis, HLD, sigmoid stricture, colostomy and colostomy reversal.  Clinical Impression    Pt admitted with above diagnosis.  Pt currently with functional limitations due to the deficits listed below (see PT Problem List). Pt in bed when PT arrived. Nurse and son present. Son reports pt had a sudden change in mobility on Sunday and then worsened on Monday 10/6 and pt no longer able to ambulate with rollator. Pt reports R LE pain and new onset of R UE pain, pt exhibits difficulty rating pain on numeric scale, nurse aware of pt pain report. Pt required mod A for supine to sit with hospital bed, pt required mod A x 2 for sit to stand  and min A increased time, RW, specific one step commands and facilitation for weight shifting to complete SPT to recliner. Pt left in recliner, all needs in place, nurse present, family had left hospital room earlier in eval. Family is requesting pt d/c to SNF they are unable to safely care for pt at current level of function. Patient will benefit from continued inpatient follow up therapy, <3 hours/day.  Pt will benefit from acute skilled PT to increase their independence and safety with mobility to allow discharge.         If plan is discharge home, recommend the following: Two people to help with walking and/or transfers;A lot of help with bathing/dressing/bathroom;Assistance with cooking/housework;Assist for transportation;Help with stairs or ramp for entrance   Can travel by private vehicle   No    Equipment Recommendations  None recommended by PT  Recommendations for Other Services       Functional Status Assessment Patient has had a recent decline in their functional status and demonstrates the ability to make significant improvements in function in a reasonable and predictable amount of time.     Precautions / Restrictions Precautions Precautions: Fall (buttock DTI) Restrictions Weight Bearing Restrictions Per Provider Order: No      Mobility  Bed Mobility Overal bed mobility: Needs Assistance Bed Mobility: Supine to Sit     Supine to sit: Mod assist, HOB elevated, Used rails     General bed mobility comments: min cues and increased time with use of bed pad to complete scoot to EOB    Transfers Overall transfer level: Needs assistance Equipment used: Rolling walker (2 wheels) Transfers: Sit to/from Stand, Bed to chair/wheelchair/BSC Sit to Stand: Mod assist, +2 physical assistance, +2 safety/equipment, From elevated surface Stand pivot transfers: Min assist         General transfer comment: specific cues for LE placement with pivoting facilitation for weight shifting, increased time and multimodal cues    Ambulation/Gait               General Gait Details: NT  Stairs            Wheelchair Mobility     Tilt Bed    Modified Rankin (Stroke Patients Only)       Balance Overall balance assessment: Needs assistance, History of Falls Sitting-balance support: Feet supported Sitting balance-Leahy Scale: Fair     Standing balance  support: Bilateral upper extremity supported, During functional activity, Reliant on assistive device for balance Standing balance-Leahy Scale: Poor Standing balance comment: pt able to tolerate standing for 5 mins for dressing change to buttock region, pt required CGA to min A and cues for posture                             Pertinent Vitals/Pain Pain Assessment Pain Assessment: Faces Faces Pain Scale: Hurts even more Pain  Location: R LE, R UE Pain Descriptors / Indicators: Aching, Constant, Discomfort, Grimacing, Guarding Pain Intervention(s): Limited activity within patient's tolerance, Monitored during session, Premedicated before session, Repositioned    Home Living Family/patient expects to be discharged to:: Private residence Living Arrangements: Other relatives (grandchildren live with pt) Available Help at Discharge: Family;Personal care attendant (6 hrs/day x 5 d wk) Type of Home: House Home Access: Stairs to enter Entrance Stairs-Rails: Right;Left;Can reach both (chair lift is broken) Secretary/administrator of Steps: 12   Home Layout: One level Home Equipment: Agricultural consultant (2 wheels);Rollator (4 wheels);Lift chair      Prior Function Prior Level of Function : Needs assist             Mobility Comments: pt and son report pt able to navigate steps to enter home and amb short distances in home with rollator, ADLs Comments: pt required A for bathing and ADLs including upper body and lower body dressing, meal prep, houeshold management and IADLS     Extremity/Trunk Assessment        Lower Extremity Assessment Lower Extremity Assessment: Generalized weakness    Cervical / Trunk Assessment Cervical / Trunk Assessment: Kyphotic  Communication   Communication Communication: No apparent difficulties    Cognition Arousal: Alert Behavior During Therapy: WFL for tasks assessed/performed   PT - Cognitive impairments: History of cognitive impairments, Memory, Attention, Initiation, Sequencing (recall--noted short term memory deficits)                       PT - Cognition Comments: fearful of falling, needs repettative cues Following commands: Impaired Following commands impaired: Follows one step commands inconsistently, Follows one step commands with increased time     Cueing       General Comments  DTI B Buttocks    Exercises     Assessment/Plan    PT Assessment  Patient needs continued PT services  PT Problem List Decreased strength;Decreased range of motion;Decreased activity tolerance;Decreased balance;Decreased mobility;Decreased coordination;Pain       PT Treatment Interventions DME instruction;Gait training;Stair training;Functional mobility training;Therapeutic activities;Therapeutic exercise;Balance training;Neuromuscular re-education;Patient/family education    PT Goals (Current goals can be found in the Care Plan section)  Acute Rehab PT Goals Patient Stated Goal: to not be in pain PT Goal Formulation: With patient Time For Goal Achievement: 05/02/24 Potential to Achieve Goals: Good    Frequency Min 2X/week     Co-evaluation               AM-PAC PT 6 Clicks Mobility  Outcome Measure Help needed turning from your back to your side while in a flat bed without using bedrails?: A Little Help needed moving from lying on your back to sitting on the side of a flat bed without using bedrails?: A Little Help needed moving to and from a bed to a chair (including a wheelchair)?: A Lot Help needed standing up from a chair using your arms (e.g., wheelchair or bedside  chair)?: A Lot Help needed to walk in hospital room?: Total Help needed climbing 3-5 steps with a railing? : Total 6 Click Score: 12    End of Session Equipment Utilized During Treatment: Gait belt Activity Tolerance: Patient limited by fatigue;Patient limited by pain Patient left: in chair;with call bell/phone within reach;with nursing/sitter in room Nurse Communication: Mobility status PT Visit Diagnosis: Unsteadiness on feet (R26.81);Other abnormalities of gait and mobility (R26.89);Muscle weakness (generalized) (M62.81);History of falling (Z91.81);Difficulty in walking, not elsewhere classified (R26.2);Pain Pain - Right/Left: Right Pain - part of body: Knee;Leg;Ankle and joints of foot;Shoulder;Arm    Time: 1002-1029 PT Time Calculation (min) (ACUTE ONLY): 27  min   Charges:   PT Evaluation $PT Eval Low Complexity: 1 Low PT Treatments $Therapeutic Activity: 8-22 mins PT General Charges $$ ACUTE PT VISIT: 1 Visit         [place], PT Acute Rehab   [place] Theresa Arnold 04/18/2024, 10:52 AM

## 2024-04-18 NOTE — Consult Note (Signed)
 WOC Nurse Consult Note: Reason for Consult: buttocks wounds  Wound type: Deep Tissue Pressure Injuries B buttocks purple maroon discoloration with lifting of epithelium  Pressure Injury POA: Yes Measurement:  see nursing flowsheet  Wound bed: as above  Drainage (amount, consistency, odor) see nursing flowsheet Periwound: Dressing procedure/placement/frequency: Cleanse B buttocks with soap and water , dry and apply Xeroform gauze ([place]son [phone]) to wounds daily. Secure with silicone foam or ABD pad and tape whichever is preferred.   POC discussed with bedside nurse. Appreciate T. Debora, RN assistance with this consult.  WOC team will not follow.  DTPI are high risk to deteriorate, reconsult WOC as needed for worsening.   Thank you,    Powell Bar MSN, RN-BC, [employer]

## 2024-04-18 NOTE — ED Provider Notes (Signed)
 I assumed care of this patient from previous provider.  Please see their note for further details of history, exam, and MDM.   Briefly patient is a 89 y.o. female who presented flank pain and leg pain. US  with DVT. Patient on Xarelto.  UA returned and consistent with UTI. CT A/P was previously negative.  Patient's BPs were soft previously and required IVF.  Now that she has UTI with flank pain and requires additional IVF. Will admit for pyelonephritis.      Trine Raynell Moder, MD 04/18/24 [phone]

## 2024-04-18 NOTE — H&P (Signed)
 History and Physical  Theresa Arnold FMW:[id number] DOB: Oct 27, 1931 DOA: 04/17/2024  Referring physician: Dr. Trine, EDP  PCP: Norleen Lynwood ORN, MD  Outpatient Specialists: None. Patient coming from: Home.  Chief Complaint: Flank pain.  HPI: Theresa Arnold is a 89 y.o. female with medical history significant for COPD, hyperlipidemia, hypertension, history of chronic sigmoid diverticulitis with abscess and stricture in the distal sigmoid colon, who presents to the ER with complaints of right flank pain and right lower extremity pain.  The pain has been severe and has prevented her to walk.  No reported subjective fevers or chills.  In the ER, afebrile, tachypneic.  UA is positive for pyuria.  CT abdomen and pelvis with contrast revealed no acute intra-abdominal or pelvic pathology.  Age-indeterminate thrombus in the right femoral vein.  Aortic atherosclerosis.  Doppler ultrasound of right lower extremity positive for DVT.  The patient was started on IV antibiotics for pyelonephritis, and started on DOAC, Xarelto, for DVT.  ED Course: Temperature 98.2.  BP 115/46, pulse 87, respiratory rate 27, O2 saturation 99% on room air.  Lab studies notable for platelet count 138.  Review of Systems: Review of systems as noted in the HPI. All other systems reviewed and are negative.   Past Medical History:  Diagnosis Date   CERVICAL RADICULOPATHY, LEFT 09/03/2010   Cervical spondylosis 11/10/2011   COPD (chronic obstructive pulmonary disease) (HCC) 11/10/2011   Diverticulitis    HTN (hypertension) 06/13/2021   HYPERLIPIDEMIA 09/03/2010   Sigmoid stricture (HCC)    Past Surgical History:  Procedure Laterality Date   COLONOSCOPY     COLOSTOMY REVERSAL N/A 09/04/2019   Procedure: PRIMARY COLOCOLOSTOMY REVERSAL AND TAKEDOWN OF HARTMAN;  Surgeon: Gladis Cough, MD;  Location: [facility];  Service: General;  Laterality: N/A;   EYE SURGERY     bilateral cataracts with lens implants   LAPAROSCOPIC PARTIAL  COLECTOMY N/A 03/22/2019   Procedure: LAPAROSCOPY, SPLENIC FLEXURE MOBILIZATION WITH OPEN HARTMANN PROCEDURE;  Surgeon: Gladis Cough, MD;  Location: [facility];  Service: General;  Laterality: N/A;   LAPAROTOMY N/A 07/22/2020   Procedure: EXPLORATORY LAPAROTOMY lysis of adhesions;  Surgeon: Vernetta Berg, MD;  Location: [facility];  Service: General;  Laterality: N/A;    Social History:  reports that she quit smoking about 12 years ago. Her smoking use included cigarettes. She started smoking about 42 years ago. She has a 30 pack-year smoking history. She has never been exposed to tobacco smoke. She has never used smokeless tobacco. She reports current alcohol use. She reports that she does not use drugs.   Allergies  Allergen Reactions   Doxycycline  Nausea And Vomiting    Family History  Problem Relation Age of Onset   Colon cancer Sister    Breast cancer Sister    Heart disease Father    Hip fracture Mother    Hypertension Other       Prior to Admission medications   Medication Sig Start Date End Date Taking? Authorizing Provider  aspirin EC 325 MG tablet Take 325 mg by mouth daily.    [provider]  Brexpiprazole  (REXULTI ) 0.5 MG TABS 1 tab by mouth twice per day 01/24/24   Norleen Lynwood ORN, MD  desonide (DESOWEN) 0.05 % cream 1 application to affected area Externally Twice a day; Duration: 30 Days 05/22/14   [provider]  furosemide  (LASIX ) 20 MG tablet Take 1 tablet (20 mg total) by mouth daily for 5 days. 01/24/24 01/29/24  Norleen Lynwood ORN,  MD  potassium chloride  (KLOR-CON  10) 10 MEQ tablet Take 1 tablet (10 mEq total) by mouth daily. 01/24/24   Norleen Lynwood ORN, MD    Physical Exam: BP (!) 86/45 (BP Location: Right Arm)   Pulse 75   Temp 98.2 F (36.8 C) (Oral)   Resp 17   Ht 5' 1 (1.549 m)   Wt 56.2 kg   SpO2 94%   BMI 23.41 kg/m   General: 89 y.o. year-old female well developed well nourished in no acute distress.  Somnolent. Cardiovascular: Regular rate  and rhythm with no rubs or gallops.  No thyromegaly or JVD noted.  Trace lower extremity edema.  Respiratory: Clear to auscultation with no wheezes or rales. Good inspiratory effort. Abdomen: Soft right flank tenderness nondistended with normal bowel sounds x4 quadrants. Muskuloskeletal: No cyanosis or clubbing noted bilaterally Neuro: CN II-XII intact, strength, sensation, reflexes Skin: No ulcerative lesions noted or rashes Psychiatry: Unable to assess judgment or mood due to somnolence.         Labs on Admission:  Basic Metabolic Panel: Recent Labs  Lab 04/17/24 1307 04/17/24 1924  NA 139 136  K 3.5 4.2  CL 102 99  CO2  --  23  GLUCOSE 135* 108*  BUN 13 12  CREATININE 0.70 0.73  CALCIUM   --  9.7   Liver Function Tests: Recent Labs  Lab 04/17/24 1924  AST 19  ALT <5  ALKPHOS 63  BILITOT 0.9  PROT 7.4  ALBUMIN  3.8   No results for input(s): LIPASE, AMYLASE in the last 168 hours. No results for input(s): AMMONIA in the last 168 hours. CBC: Recent Labs  Lab 04/17/24 1243 04/17/24 1307  WBC 10.2  --   NEUTROABS 8.2*  --   HGB 14.0 13.6  HCT 44.9 40.0  MCV 94.9  --   PLT 138*  --    Cardiac Enzymes: No results for input(s): CKTOTAL, CKMB, CKMBINDEX, TROPONINI in the last 168 hours.  BNP (last 3 results) Recent Labs    11/26/23 1140  BNP 52.5    ProBNP (last 3 results) No results for input(s): PROBNP in the last 8760 hours.  CBG: No results for input(s): GLUCAP in the last 168 hours.  Radiological Exams on Admission: VAS US  LOWER EXTREMITY VENOUS (DVT) (7a-7p) Result Date: 04/17/2024  Lower Venous DVT Study Patient Name:  Theresa Arnold  Date of Exam:   04/17/2024 Medical Rec #: [id number]        Accession #:    [id number] Date of Birth: 02-23-1932       Patient Gender: F Patient Age:   89 years Exam Location:  [facility] Procedure:      VAS US  LOWER EXTREMITY VENOUS (DVT) Referring Phys: SCOTT ZACKOWSKI  --------------------------------------------------------------------------------  Indications: Pain, and chest pain.  Comparison Study: Previous study on bilateral lower extremity on 5.16.2025. Performing Technologist: Edilia Elden Appl  Examination Guidelines: A complete evaluation includes B-mode imaging, spectral Doppler, color Doppler, and power Doppler as needed of all accessible portions of each vessel. Bilateral testing is considered an integral part of a complete examination. Limited examinations for reoccurring indications may be performed as noted. The reflux portion of the exam is performed with the patient in reverse Trendelenburg.  +---------+---------------+---------+-----------+----------+--------------+ RIGHT    CompressibilityPhasicitySpontaneityPropertiesThrombus Aging +---------+---------------+---------+-----------+----------+--------------+ CFV      Full           Yes      Yes                                 +---------+---------------+---------+-----------+----------+--------------+  SFJ      Full           Yes      Yes                                 +---------+---------------+---------+-----------+----------+--------------+ FV Prox  Full                                                        +---------+---------------+---------+-----------+----------+--------------+ FV Mid   Partial        Yes      Yes                                 +---------+---------------+---------+-----------+----------+--------------+ FV DistalFull                                                        +---------+---------------+---------+-----------+----------+--------------+ PFV      Full           Yes      Yes                                 +---------+---------------+---------+-----------+----------+--------------+ POP      Full           Yes      Yes                                 +---------+---------------+---------+-----------+----------+--------------+  PTV      Full                                                        +---------+---------------+---------+-----------+----------+--------------+ PERO     Full                                                        +---------+---------------+---------+-----------+----------+--------------+ Deep vein thrombosis noted in the right middle femoral vein. All other segments compressible.  +----+---------------+---------+-----------+----------+--------------+ LEFTCompressibilityPhasicitySpontaneityPropertiesThrombus Aging +----+---------------+---------+-----------+----------+--------------+ CFV Full           Yes      Yes                                 +----+---------------+---------+-----------+----------+--------------+ SFJ Full           Yes      Yes                                 +----+---------------+---------+-----------+----------+--------------+     Summary: RIGHT: - Findings consistent with  age indeterminate deep vein thrombosis involving the right femoral vein.  - No cystic structure found in the popliteal fossa.  LEFT: - No evidence of common femoral vein obstruction.   *See table(s) above for measurements and observations.    Preliminary    CT ABDOMEN PELVIS W CONTRAST Result Date: 04/17/2024 CLINICAL DATA:  Abdominal pain. EXAM: CT ABDOMEN AND PELVIS WITH CONTRAST TECHNIQUE: Multidetector CT imaging of the abdomen and pelvis was performed using the standard protocol following bolus administration of intravenous contrast. RADIATION DOSE REDUCTION: This exam was performed according to the departmental dose-optimization program which includes automated exposure control, adjustment of the mA and/or kV according to patient size and/or use of iterative reconstruction technique. CONTRAST:  OMNIPAQUE  IOHEXOL  300 MG/ML  SOLN COMPARISON:  CT dated 07/20/2020. FINDINGS: Lower chest: No acute abnormality. No intra-abdominal free air or free fluid. Hepatobiliary: Subcentimeter  hypodense focus in the right lobe of the liver is too small to characterize. No biliary dilatation. The gallbladder is unremarkable. Pancreas: Unremarkable. No pancreatic ductal dilatation or surrounding inflammatory changes. Spleen: Normal in size without focal abnormality. Adrenals/Urinary Tract: The adrenal glands are unremarkable. There is no hydronephrosis on either side. There is symmetric enhancement and excretion of contrast by both kidneys. The visualized ureters and urinary bladder appear unremarkable. Stomach/Bowel: Postsurgical changes with anastomotic staple line in the distal colon. There is no bowel obstruction or active inflammation. The appendix is normal. Vascular/Lymphatic: Moderate aortoiliac atherosclerotic disease. Age indeterminate thrombus in the right femoral vein (80/2). Correlation with clinical exam and further evaluation with duplex ultrasound recommended. The IVC is unremarkable. No portal venous gas. There is no adenopathy. Reproductive: Partially calcified small exophytic uterine fibroid. Fluid noted within the vagina. Other: Midline vertical anterior pelvic wall incisional scar. Musculoskeletal: Osteopenia with degenerative changes. No acute osseous pathology. IMPRESSION: 1. No acute intra-abdominal or pelvic pathology. 2. Age indeterminate thrombus in the right femoral vein. Correlation with clinical exam and further evaluation with duplex ultrasound recommended. 3.  Aortic Atherosclerosis (ICD10-I70.0). These results were called by telephone at the time of interpretation on 04/17/2024 at 4:36 pm to Dr Scott Zackowski, who verbally acknowledged these results. Electronically Signed   By: Vanetta Chou M.D.   On: 04/17/2024 16:45   DG Pelvis Portable Result Date: 04/17/2024 CLINICAL DATA:  Right flank and like pain. EXAM: PORTABLE PELVIS 1-2 VIEWS COMPARISON:  03/18/2019. FINDINGS: Osteopenia. No fracture or dislocation. Sacroiliac joints are patent. IMPRESSION: No acute  findings. Electronically Signed   By: Newell Eke M.D.   On: 04/17/2024 15:57   DG Chest Port 1 View Result Date: 04/17/2024 EXAM: 1 VIEW(S) XRAY OF THE CHEST 04/17/2024 12:47:23 PM COMPARISON: 11/26/2023 CLINICAL HISTORY: pain. pain pain. pain FINDINGS: LUNGS AND PLEURA: No focal pulmonary opacity. No pulmonary edema. No pleural effusion. No pneumothorax. HEART AND MEDIASTINUM: Ectasia aorta. No acute abnormality of the cardiac and mediastinal silhouettes. BONES AND SOFT TISSUES: No acute osseous abnormality. IMPRESSION: 1. No acute cardiopulmonary process. 2. Aortic ectasia. Electronically signed by: Norleen Boxer MD 04/17/2024 01:31 PM EDT RP Workstation: [id number]    EKG: I independently viewed the EKG done and my findings are as followed: Sinus rhythm rate of 80.  Nonspecific ST-T changes.  QTc 434.  Assessment/Plan Present on Admission:  Pyelonephritis  Principal Problem:   Pyelonephritis  Complicated UTI with right-sided pyelonephritis, POA UA is positive for pyuria. Follow urine culture for ID and sensitivities. Monitor fever curve and WBCs. Pain control as needed Continue IV fluid hydration. Repeat labs in  the morning.  Right lower extremity DVT, POA Started on DOAC, Xarelto, in the ER, continue. Outpatient follow-up with primary care provider.  Hypotension With MAP of 59 Continue IV fluid hydration Maintain MAP above 65 Closely monitor vital signs Follow lactic acid and peripheral blood cultures x 2  Generalized weakness PT OT evaluation Fall precautions.   Time: 75 minutes.    DVT prophylaxis: Xarelto  Code Status: Full code.  Family Communication: None at bedside.  Disposition Plan: Admitted to MedSurg unit.  Consults called: None.  Admission status: Observation status.   Status is: Observation    Terry LOISE Hurst MD [facility] Pager [phone]  If 7PM-7AM, please contact night-coverage [email or web address] Password [email or web address]  04/18/2024,  1:05 AM

## 2024-04-18 NOTE — ED Notes (Signed)
 Pt was provided with a bedpan to void by [facility], NT and this NT.

## 2024-04-18 NOTE — TOC Progression Note (Addendum)
 Transition of Care [facility]) - Progression Note    Patient Details  Name: Theresa Arnold MRN: [id number] Date of Birth: 22-May-1932  Transition of Care [facility]) CM/SW Contact  Alfonse JONELLE Rex, RN Phone Number: 04/18/2024, 2:01 PM  Clinical Narrative:   PT eval completed, recommendation for short term rehab/SNF, work up initiated by ED Case Manager. Call to pt's son Yancy to review SNF bed offers ( [facility] , [facility], [facility], [facility], [facility]), son accepted bed offer at [facility]. Teams chat sent to attending, no expected dc at this time. Will wait until closer to discharge to initiate auth.  Tracey, admit coord w/[facility], notified. TOC will continue to follow.     Expected Discharge Plan: Skilled Nursing Facility Barriers to Discharge: Continued Medical Work up               Expected Discharge Plan and Services                                               Social Drivers of Health (SDOH) Interventions SDOH Screenings   Food Insecurity: Patient Unable To Answer (04/18/2024)  Transportation Needs: No Transportation Needs (04/18/2024)  Utilities: Patient Unable To Answer (04/18/2024)  Depression (PHQ2-9): Low Risk  (12/03/2023)  Social Connections: Patient Unable To Answer (04/18/2024)  Tobacco Use: Medium Risk (04/17/2024)    Readmission Risk Interventions    04/18/2024    2:00 PM  Readmission Risk Prevention Plan  Transportation Screening Complete  PCP or Specialist Appt within 5-7 Days Complete  Home Care Screening Complete  Medication Review (RN CM) Complete

## 2024-04-18 NOTE — Progress Notes (Signed)
 Care started prior to midnight in the emergency room patient was admitted early this morning after midnight by Dr. Terry Hurst and I am in current agreement with her assessment and plan.  Additional changes to the plan of care have been made accordingly.  Patient is an 89 year old female with a past medical history significant for but not limited to COPD, HLD, HTN, history of chronic sigmoid diverticulitis with abscess and stricture in the distal sigmoid colon, cervical spondylosis and other comorbidities who presents the hospital with severe right flank pain and right lower extremity pain that has preventing her from ambulating.  CT Abd/Pelvis revealed no acute intraabdominal/pelvic pathology but did note a thrombus in the right femoral vein.  Doppler of the right lower extremity was positive for acute DVT.  She has been initiated on IV antibiotics for pyelonephritis and started on DOAC for DVT treatment. Currently being treated for the following but not limited too:  Assessment and Plan:  Complicated UTI with right-sided pyelonephritis, POA: U/A done and showed a hazy appearance with moderate hemoglobin, small leukocytes, positive nitrites, rare bacteria, 0-5 RBCs per high-power field and greater than 50 WBCs with urine culture still pending. Follow urine culture for ID and sensitivities. CTM fever curve and trend CBC. Pain control as needed. Continue IV fluid hydration w/ NS @ 75 mL/hr and IV Abx w/ IV Ceftriaxone.   Right lower extremity DVT, POA: Started on DOAC, Xarelto, in the ER, continue. Outpatient follow-up with primary care provider. Consider further w/up    Hypotension With MAP of 59: Continue IV fluid hydration and Maintain MAP above 65. Discontinue Furosemide . Closely monitor vital signs. Follow lactic acid and peripheral blood cultures x 2  Metabolic Acidosis: Mild. CO2 is now 20, AG is now 12, Chloride Level is 106. C/w IVF as above. CTM and repeat CMP in the AM  Normocytic Anemia:  Hgb/Hct is now 11.1/35.2. Check Anemia Panel in the AM. CTM for S/Sx of Bleeding; no overt bleeding noted. Repeat CBC in the AM  Thrombocytopenia: Plt Count is now 121. CTM for S/Sx of Bleeding; No overt bleeding noted. Repeat CBC in the AM   Generalized Weakness: PT/OT to Eval and Treat and recommending SNF. C/w Fall precautions.  Buttocks Wounds:  Wound 04/18/24 0200 Pressure Injury Buttocks Mid;Right;Left Deep Tissue Pressure Injury - Purple or maroon localized area of discolored intact skin or blood-filled blister due to damage of underlying soft tissue from pressure and/or shear. (Active)  -WOC Consult  Will continue to monitor patient's clinical response to intervention will repeat blood work in the a.m.

## 2024-04-18 NOTE — Plan of Care (Signed)
Discuss and review plan of care with patient/family  

## 2024-04-18 NOTE — Evaluation (Signed)
 Occupational Therapy Evaluation Patient Details Name: Theresa Arnold MRN: [id number] DOB: 1931/11/15 Today's Date: 04/18/2024   History of Present Illness   89 yo female presents to therapy following hospital admission on 04/17/2024 due to LE and R flank pain impacting pt ability to ambulate. Pt was found to have R LE DVT and pyelonephritis/UTI. Pt PMH includes but is not limited to: COPD, falls, cervical spondylosis with radiculopathy, HTN, diverticulitis, HLD, sigmoid stricture, colostomy and colostomy reversal.     Clinical Impressions Pt c/o pain to R lower chest/lung with breathing, unable to take deep breathes without 10/10 pain, consistent throughout session. Pt O2 saturation remained above 94% throughout session. Pt lives at home with grandchildren who work during the day, [address], has friend who assists with ADL/IADLs M-F for 6 hours, PLOF needs help with dressing/bathing and uses RW for ambulation short distances. Pt currently significantly limited due to generalized weakness and pain, mod/max A x2 for transfer from recliner to chair, Pt appeared confused once standing, unsure of how to get to the bed or what we were doing. Pt A/Ox3 to self, location, and situation. Pt would benefit from continued acute OT to maximize participation with ADLs and improve functional strength, DC to postacute rehab <3hrs/day recommended.      If plan is discharge home, recommend the following:   Two people to help with walking and/or transfers;A lot of help with bathing/dressing/bathroom;Assistance with cooking/housework;Assist for transportation;Help with stairs or ramp for entrance;Direct supervision/assist for medications management     Functional Status Assessment   Patient has had a recent decline in their functional status and demonstrates the ability to make significant improvements in function in a reasonable and predictable amount of time.     Equipment Recommendations   None recommended  by OT     Recommendations for Other Services         Precautions/Restrictions   Precautions Precautions: Fall Restrictions Weight Bearing Restrictions Per Provider Order: No     Mobility Bed Mobility Overal bed mobility: Needs Assistance Bed Mobility: Sit to Supine       Sit to supine: Max assist   General bed mobility comments: max A to assist back to bed    Transfers Overall transfer level: Needs assistance Equipment used: Rolling walker (2 wheels) Transfers: Sit to/from Stand, Bed to chair/wheelchair/BSC Sit to Stand: Mod assist, Max assist, +2 physical assistance, From elevated surface     Step pivot transfers: Mod assist, +2 physical assistance     General transfer comment: mod/max A for STS and transfer from recliner to bed using RW. Pt with poor balance, max verbal cues for taking steps, Pt appeared confused once standing on how to get to bed      Balance Overall balance assessment: Needs assistance Sitting-balance support: No upper extremity supported, Feet supported Sitting balance-Leahy Scale: Fair     Standing balance support: Bilateral upper extremity supported, During functional activity, Reliant on assistive device for balance Standing balance-Leahy Scale: Poor Standing balance comment: reliant on RW and therapist support                           ADL either performed or assessed with clinical judgement   ADL Overall ADL's : Needs assistance/impaired Eating/Feeding: Set up;Sitting;Bed level   Grooming: Minimal assistance;Sitting   Upper Body Bathing: Moderate assistance;Sitting   Lower Body Bathing: Total assistance;Sitting/lateral leans   Upper Body Dressing : Moderate assistance;Sitting   Lower Body Dressing:  Total assistance;Sitting/lateral leans;Bed level   Toilet Transfer: Moderate assistance;Maximal assistance;+2 for physical assistance;Rolling walker (2 wheels);BSC/3in1   Toileting- Clothing Manipulation and  Hygiene: Maximal assistance;Sitting/lateral lean         General ADL Comments: Pt mod/max A x2 for transfer with RW, cueing for taking steps and turning. Pt needs help with dressing at baseline, mod/total for UB/LB dressing/bathing.     Vision Baseline Vision/History: 0 No visual deficits Ability to See in Adequate Light: 0 Adequate Patient Visual Report: No change from baseline       Perception         Praxis         Pertinent Vitals/Pain Pain Assessment Pain Assessment: 0-10 Pain Score: 10-Worst pain ever Pain Location: R chest with breathing Pain Descriptors / Indicators: Aching, Constant, Discomfort, Grimacing, Guarding Pain Intervention(s): Monitored during session     Extremity/Trunk Assessment Upper Extremity Assessment Upper Extremity Assessment: Generalized weakness   Lower Extremity Assessment Lower Extremity Assessment: Generalized weakness   Cervical / Trunk Assessment Cervical / Trunk Assessment: Kyphotic   Communication Communication Communication: Impaired Factors Affecting Communication: Hearing impaired   Cognition Arousal: Alert Behavior During Therapy: WFL for tasks assessed/performed Cognition: Cognition impaired   Orientation impairments: Time         OT - Cognition Comments: A/Ox3, not oriented to month/year                 Following commands: Intact       Cueing  General Comments   Cueing Techniques: Verbal cues      Exercises     Shoulder Instructions      Home Living Family/patient expects to be discharged to:: Private residence Living Arrangements: Other relatives Available Help at Discharge: Family;Friend(s) Type of Home: House Home Access: Stairs to enter Secretary/administrator of Steps: 12 Entrance Stairs-Rails: Right;Left;Can reach both Home Layout: One level     Bathroom Shower/Tub: Producer, television/film/video: Standard Bathroom Accessibility: No   Home Equipment: Agricultural consultant (2  wheels);Rollator (4 wheels);Lift chair   Additional Comments: Pt lives with grandchildren, has a friend who comes by 5X/wk to cook breakfast from 8am-2pm. grandchildren work during the day      Prior Functioning/Environment Prior Level of Function : Needs assist             Mobility Comments: pt and son report pt able to navigate steps to enter home and amb short distances in home with rollator, ADLs Comments: pt required A for bathing and ADLs including upper body and lower body dressing, meal prep, houeshold management and IADLS    OT Problem List: Decreased strength;Decreased range of motion;Decreased activity tolerance;Impaired balance (sitting and/or standing);Decreased cognition;Decreased safety awareness;Pain   OT Treatment/Interventions: Self-care/ADL training;Therapeutic exercise;Energy conservation;DME and/or AE instruction;Therapeutic activities;Patient/family education;Balance training      OT Goals(Current goals can be found in the care plan section)   Acute Rehab OT Goals Patient Stated Goal: to manage pain OT Goal Formulation: With patient Time For Goal Achievement: 05/02/24 Potential to Achieve Goals: Good   OT Frequency:  Min 2X/week    Co-evaluation              AM-PAC OT 6 Clicks Daily Activity     Outcome Measure Help from another person eating meals?: A Little Help from another person taking care of personal grooming?: A Little Help from another person toileting, which includes using toliet, bedpan, or urinal?: A Lot Help from another person bathing (including washing, rinsing,  drying)?: A Lot Help from another person to put on and taking off regular upper body clothing?: A Lot Help from another person to put on and taking off regular lower body clothing?: Total 6 Click Score: 13   End of Session Equipment Utilized During Treatment: Gait belt;Rolling walker (2 wheels) Nurse Communication: Mobility status  Activity Tolerance: Patient limited  by pain Patient left: in bed;with call bell/phone within reach  OT Visit Diagnosis: Unsteadiness on feet (R26.81);Other abnormalities of gait and mobility (R26.89);Muscle weakness (generalized) (M62.81);Pain;Other symptoms and signs involving cognitive function Pain - Right/Left: Right Pain - part of body:  (chest)                Time: 8857-8785 OT Time Calculation (min): 32 min Charges:  OT General Charges $OT Visit: 1 Visit OT Evaluation $OT Eval Moderate Complexity: 1 Mod OT Treatments $Self Care/Home Management : 8-22 mins  Tavarion Babington, OTR/L   Theresa Arnold 04/18/2024, 12:20 PM

## 2024-04-19 DIAGNOSIS — I959 Hypotension, unspecified: Secondary | ICD-10-CM | POA: Insufficient documentation

## 2024-04-19 DIAGNOSIS — I824Z1 Acute embolism and thrombosis of unspecified deep veins of right distal lower extremity: Secondary | ICD-10-CM | POA: Insufficient documentation

## 2024-04-19 DIAGNOSIS — N12 Tubulo-interstitial nephritis, not specified as acute or chronic: Secondary | ICD-10-CM | POA: Diagnosis not present

## 2024-04-19 LAB — COMPREHENSIVE METABOLIC PANEL WITH GFR
ALT: <5 U/L (ref 0–44)
AST: 16 U/L (ref 15–41)
Albumin: 3.1 g/dL — ABNORMAL LOW (ref 3.5–5.0)
Alkaline Phosphatase: 58 U/L (ref 38–126)
Anion gap: 15 (ref 5–15)
BUN: 12 mg/dL (ref 8–23)
CO2: 19 mmol/L — ABNORMAL LOW (ref 22–32)
Calcium: 8.9 mg/dL (ref 8.9–10.3)
Chloride: 102 mmol/L (ref 98–111)
Creatinine, Ser: 0.62 mg/dL (ref 0.44–1.00)
GFR, Estimated: >60 mL/min (ref >60)
Glucose, Bld: 72 mg/dL (ref 70–99)
Potassium: 3.7 mmol/L (ref 3.5–5.1)
Sodium: 136 mmol/L (ref 135–145)
Total Bilirubin: 0.4 mg/dL (ref 0.0–1.2)
Total Protein: 6.7 g/dL (ref 6.5–8.1)

## 2024-04-19 LAB — CBC WITH DIFFERENTIAL/PLATELET
Abs Immature Granulocytes: 0.03 K/uL (ref 0.00–0.07)
Basophils Absolute: 0 K/uL (ref 0.0–0.1)
Basophils Relative: 0 %
Eosinophils Absolute: 0.1 K/uL (ref 0.0–0.5)
Eosinophils Relative: 1 %
HCT: 38 % (ref 36.0–46.0)
Hemoglobin: 11.7 g/dL — ABNORMAL LOW (ref 12.0–15.0)
Immature Granulocytes: 0 %
Lymphocytes Relative: 13 %
Lymphs Abs: 1.2 K/uL (ref 0.7–4.0)
MCH: 30.3 pg (ref 26.0–34.0)
MCHC: 30.8 g/dL (ref 30.0–36.0)
MCV: 98.4 fL (ref 80.0–100.0)
Monocytes Absolute: 0.6 K/uL (ref 0.1–1.0)
Monocytes Relative: 6 %
Neutro Abs: 7.3 K/uL (ref 1.7–7.7)
Neutrophils Relative %: 80 %
Platelets: 127 K/uL — ABNORMAL LOW (ref 150–400)
RBC: 3.86 MIL/uL — ABNORMAL LOW (ref 3.87–5.11)
RDW: 13.6 % (ref 11.5–15.5)
WBC: 9.2 K/uL (ref 4.0–10.5)
nRBC: 0 % (ref 0.0–0.2)

## 2024-04-19 LAB — PHOSPHORUS: Phosphorus: 2.9 mg/dL (ref 2.5–4.6)

## 2024-04-19 LAB — MAGNESIUM: Magnesium: 2.1 mg/dL (ref 1.7–2.4)

## 2024-04-19 NOTE — Progress Notes (Signed)
 [facility] PROGRESS NOTE    Progress Note  Theresa Arnold  FMW:[id number] DOB: 11-05-1931 DOA: 04/17/2024 PCP: Norleen Lynwood ORN, MD     Brief Narrative:   Theresa Arnold is an 89 y.o. female past medical history of COPD, essential hypertension hyperlipidemia, history of diverticulitis with abscess and stricture of the distal colon comes in with severe right flank pain and right lower extremity, was found to have a possible complicated right-sided pyelonephritis and CT scan of the abdomen pelvis revealed no acute intracranial pathology but today showed right femoral vein thrombus, lower extremity Doppler confirms that which was positive for acute DVT.   Assessment/Plan:   Complicated UTI/pyelonephritis UA was concerning for infection started empirically on IV Rocephin urine cultures have been sent.  Right sided lower extremity DVT present on admission: Started on Xarelto will continue as an outpatient.  Hypotension: She was started on aggressive fluid resuscitation. Hypotension is resolved. Unfortunately blood cultures were not sent on admission.  Generalized weakness: PT was consulted will need skilled nursing facility upon discharge. Social worker involved son To better collapse skilled nursing facility.   DVT prophylaxis: lovenox  Family Communication:none Status is: Inpatient Remains inpatient appropriate because: Acute DVT and possible UTI    Code Status:     Code Status Orders  (From admission, onward)           Start     Ordered   04/18/24 0101  Full code  Continuous       Question:  By:  Answer:  Consent: discussion documented in EHR   04/18/24 0100           Code Status History     Date Active Date Inactive Code Status Order ID Comments User Context   07/20/2020 0458 08/05/2020 0136 Full Code [id number]  Chotiner, Adine RAMAN, MD ED   09/04/2019 1339 09/10/2019 1723 Full Code [id number]  Gladis Cough, MD Inpatient   03/15/2019 2300 04/02/2019 1916  Full Code [id number]  Tobie Jorie SAUNDERS, MD ED         IV Access:   Peripheral IV   Procedures and diagnostic studies:   VAS US  LOWER EXTREMITY VENOUS (DVT) (7a-7p) Result Date: 04/18/2024  Lower Venous DVT Study Patient Name:  Theresa Arnold  Date of Exam:   04/17/2024 Medical Rec #: [id number]        Accession #:    [id number] Date of Birth: 24-Feb-1932       Patient Gender: F Patient Age:   40 years Exam Location:  [facility] Procedure:      VAS US  LOWER EXTREMITY VENOUS (DVT) Referring Phys: SCOTT ZACKOWSKI --------------------------------------------------------------------------------  Indications: Pain, and chest pain.  Comparison Study: Previous study on bilateral lower extremity on 5.16.2025. Performing Technologist: Edilia Elden Appl  Examination Guidelines: A complete evaluation includes B-mode imaging, spectral Doppler, color Doppler, and power Doppler as needed of all accessible portions of each vessel. Bilateral testing is considered an integral part of a complete examination. Limited examinations for reoccurring indications may be performed as noted. The reflux portion of the exam is performed with the patient in reverse Trendelenburg.  +---------+---------------+---------+-----------+----------+--------------+ RIGHT    CompressibilityPhasicitySpontaneityPropertiesThrombus Aging +---------+---------------+---------+-----------+----------+--------------+ CFV      Full           Yes      Yes                                 +---------+---------------+---------+-----------+----------+--------------+  SFJ      Full           Yes      Yes                                 +---------+---------------+---------+-----------+----------+--------------+ FV Prox  Full                                                        +---------+---------------+---------+-----------+----------+--------------+ FV Mid   Partial        Yes      Yes                                  +---------+---------------+---------+-----------+----------+--------------+ FV DistalFull                                                        +---------+---------------+---------+-----------+----------+--------------+ PFV      Full           Yes      Yes                                 +---------+---------------+---------+-----------+----------+--------------+ POP      Full           Yes      Yes                                 +---------+---------------+---------+-----------+----------+--------------+ PTV      Full                                                        +---------+---------------+---------+-----------+----------+--------------+ PERO     Full                                                        +---------+---------------+---------+-----------+----------+--------------+ Deep vein thrombosis noted in the right middle femoral vein. All other segments compressible.  +----+---------------+---------+-----------+----------+--------------+ LEFTCompressibilityPhasicitySpontaneityPropertiesThrombus Aging +----+---------------+---------+-----------+----------+--------------+ CFV Full           Yes      Yes                                 +----+---------------+---------+-----------+----------+--------------+ SFJ Full           Yes      Yes                                 +----+---------------+---------+-----------+----------+--------------+     Summary: RIGHT: - Findings consistent with  age indeterminate deep vein thrombosis involving the right femoral vein.  - No cystic structure found in the popliteal fossa.  LEFT: - No evidence of common femoral vein obstruction.   *See table(s) above for measurements and observations. Electronically signed by Debby Robertson on 04/18/2024 at 4:55:14 PM.    Final    CT ABDOMEN PELVIS W CONTRAST Result Date: 04/17/2024 CLINICAL DATA:  Abdominal pain. EXAM: CT ABDOMEN AND PELVIS WITH CONTRAST TECHNIQUE:  Multidetector CT imaging of the abdomen and pelvis was performed using the standard protocol following bolus administration of intravenous contrast. RADIATION DOSE REDUCTION: This exam was performed according to the departmental dose-optimization program which includes automated exposure control, adjustment of the mA and/or kV according to patient size and/or use of iterative reconstruction technique. CONTRAST:  OMNIPAQUE  IOHEXOL  300 MG/ML  SOLN COMPARISON:  CT dated 07/20/2020. FINDINGS: Lower chest: No acute abnormality. No intra-abdominal free air or free fluid. Hepatobiliary: Subcentimeter hypodense focus in the right lobe of the liver is too small to characterize. No biliary dilatation. The gallbladder is unremarkable. Pancreas: Unremarkable. No pancreatic ductal dilatation or surrounding inflammatory changes. Spleen: Normal in size without focal abnormality. Adrenals/Urinary Tract: The adrenal glands are unremarkable. There is no hydronephrosis on either side. There is symmetric enhancement and excretion of contrast by both kidneys. The visualized ureters and urinary bladder appear unremarkable. Stomach/Bowel: Postsurgical changes with anastomotic staple line in the distal colon. There is no bowel obstruction or active inflammation. The appendix is normal. Vascular/Lymphatic: Moderate aortoiliac atherosclerotic disease. Age indeterminate thrombus in the right femoral vein (80/2). Correlation with clinical exam and further evaluation with duplex ultrasound recommended. The IVC is unremarkable. No portal venous gas. There is no adenopathy. Reproductive: Partially calcified small exophytic uterine fibroid. Fluid noted within the vagina. Other: Midline vertical anterior pelvic wall incisional scar. Musculoskeletal: Osteopenia with degenerative changes. No acute osseous pathology. IMPRESSION: 1. No acute intra-abdominal or pelvic pathology. 2. Age indeterminate thrombus in the right femoral vein. Correlation  with clinical exam and further evaluation with duplex ultrasound recommended. 3.  Aortic Atherosclerosis (ICD10-I70.0). These results were called by telephone at the time of interpretation on 04/17/2024 at 4:36 pm to Dr Scott Zackowski, who verbally acknowledged these results. Electronically Signed   By: Vanetta Chou M.D.   On: 04/17/2024 16:45   DG Pelvis Portable Result Date: 04/17/2024 CLINICAL DATA:  Right flank and like pain. EXAM: PORTABLE PELVIS 1-2 VIEWS COMPARISON:  03/18/2019. FINDINGS: Osteopenia. No fracture or dislocation. Sacroiliac joints are patent. IMPRESSION: No acute findings. Electronically Signed   By: Newell Eke M.D.   On: 04/17/2024 15:57   DG Chest Port 1 View Result Date: 04/17/2024 EXAM: 1 VIEW(S) XRAY OF THE CHEST 04/17/2024 12:47:23 PM COMPARISON: 11/26/2023 CLINICAL HISTORY: pain. pain pain. pain FINDINGS: LUNGS AND PLEURA: No focal pulmonary opacity. No pulmonary edema. No pleural effusion. No pneumothorax. HEART AND MEDIASTINUM: Ectasia aorta. No acute abnormality of the cardiac and mediastinal silhouettes. BONES AND SOFT TISSUES: No acute osseous abnormality. IMPRESSION: 1. No acute cardiopulmonary process. 2. Aortic ectasia. Electronically signed by: Norleen Boxer MD 04/17/2024 01:31 PM EDT RP Workstation: [id number]     Medical Consultants:   None.   Subjective:    Theresa Arnold no complaints no signs of overt bleeding.  Objective:    Vitals:   04/18/24 2200 04/19/24 0237 04/19/24 0504 04/19/24 0506  BP: (!) 111/47 (!) 118/52 120/62 120/62  Pulse: 65 64 73 79  Resp: 18 16 18 20   Temp: 97.7 F (36.5  C) 97.8 F (36.6 C) 99.1 F (37.3 C) 99.1 F (37.3 C)  TempSrc: Oral Oral Oral Oral  SpO2: 100% 98% 95% 94%  Weight:      Height:       SpO2: 94 %   Intake/Output Summary (Last 24 hours) at 04/19/2024 0842 Last data filed at 04/19/2024 9191 Gross per 24 hour  Intake 1602.74 ml  Output 1010 ml  Net 592.74 ml   Filed Weights   04/17/24  1221 04/18/24 0158  Weight: 56.2 kg 56.5 kg    Exam: General exam: In no acute distress. Respiratory system: Good air movement and clear to auscultation. Cardiovascular system: S1 & S2 heard, RRR. No JVD. Gastrointestinal system: Abdomen is nondistended, soft and nontender.  Extremities: No pedal edema. Skin: No rashes, lesions or ulcers Psychiatry: Judgement and insight appear normal. Mood & affect appropriate.    Data Reviewed:    Labs: Basic Metabolic Panel: Recent Labs  Lab 04/17/24 1307 04/17/24 1924 04/18/24 0355 04/19/24 0352  NA 139 136 138 136  K 3.5 4.2 3.6 3.7  CL 102 99 106 102  CO2  --  23 20* 19*  GLUCOSE 135* 108* 86 72  BUN 13 12 12 12   CREATININE 0.70 0.73 0.62 0.62  CALCIUM   --  9.7 8.4* 8.9  MG  --   --  2.0 2.1  PHOS  --   --  3.5 2.9   GFR Estimated Creatinine Clearance: 36 mL/min (by C-G formula based on SCr of 0.62 mg/dL). Liver Function Tests: Recent Labs  Lab 04/17/24 1924 04/19/24 0352  AST 19 16  ALT <5 <5  ALKPHOS 63 58  BILITOT 0.9 0.4  PROT 7.4 6.7  ALBUMIN  3.8 3.1*   No results for input(s): LIPASE, AMYLASE in the last 168 hours. No results for input(s): AMMONIA in the last 168 hours. Coagulation profile No results for input(s): INR, PROTIME in the last 168 hours. COVID-19 Labs  No results for input(s): DDIMER, FERRITIN, LDH, CRP in the last 72 hours.  Lab Results  Component Value Date   SARSCOV2NAA NEGATIVE 07/20/2020   SARSCOV2NAA NEGATIVE 07/17/2020   SARSCOV2NAA NEGATIVE 09/02/2019   SARSCOV2NAA NOT DETECTED 08/16/2019    CBC: Recent Labs  Lab 04/17/24 1243 04/17/24 1307 04/18/24 0355 04/19/24 0352  WBC 10.2  --  9.7 9.2  NEUTROABS 8.2*  --   --  7.3  HGB 14.0 13.6 11.1* 11.7*  HCT 44.9 40.0 35.2* 38.0  MCV 94.9  --  98.3 98.4  PLT 138*  --  121* 127*   Cardiac Enzymes: No results for input(s): CKTOTAL, CKMB, CKMBINDEX, TROPONINI in the last 168 hours. BNP (last 3  results) No results for input(s): PROBNP in the last 8760 hours. CBG: No results for input(s): GLUCAP in the last 168 hours. D-Dimer: No results for input(s): DDIMER in the last 72 hours. Hgb A1c: No results for input(s): HGBA1C in the last 72 hours. Lipid Profile: No results for input(s): CHOL, HDL, LDLCALC, TRIG, CHOLHDL, LDLDIRECT in the last 72 hours. Thyroid  function studies: No results for input(s): TSH, T4TOTAL, T3FREE, THYROIDAB in the last 72 hours.  Invalid input(s): FREET3 Anemia work up: No results for input(s): VITAMINB12, FOLATE, FERRITIN, TIBC, IRON, RETICCTPCT in the last 72 hours. Sepsis Labs: Recent Labs  Lab 04/17/24 1243 04/18/24 0101 04/18/24 0355 04/19/24 0352  WBC 10.2  --  9.7 9.2  LATICACIDVEN  --  0.7  --   --    Microbiology No results found for  this or any previous visit (from the past 240 hours).   Medications:    brexpiprazole   0.5 mg Oral BID   potassium chloride   10 mEq Oral Daily   Rivaroxaban  15 mg Oral BID WC   Followed by   NOREEN ON 05/08/2024] rivaroxaban  20 mg Oral Daily   Continuous Infusions:  sodium chloride  75 mL/hr at 04/18/24 2207   cefTRIAXone (ROCEPHIN)  IV 2 g (04/19/24 0200)      LOS: 1 day   Theresa Arnold  [facility]  04/19/2024, 8:42 AM

## 2024-04-19 NOTE — TOC Progression Note (Signed)
 Transition of Care [facility]) - Progression Note    Patient Details  Name: Theresa Arnold MRN: [id number] Date of Birth: 01/26/1932  [facility]) CM/SW Contact  Sheri ONEIDA [place], [place] Phone Number: 04/19/2024, 10:57 AM  Clinical Narrative:    Ins auth started for [employer], pending approval.    Expected Discharge Plan: Skilled Nursing Facility Barriers to Discharge: Continued Medical Work up               Expected Discharge Plan and Services                                               Social Drivers of Health (SDOH) Interventions SDOH Screenings   Food Insecurity: Patient Unable To Answer (04/18/2024)  Transportation Needs: No Transportation Needs (04/18/2024)  Utilities: Patient Unable To Answer (04/18/2024)  Depression (PHQ2-9): Low Risk  (12/03/2023)  Social Connections: Patient Unable To Answer (04/18/2024)  Tobacco Use: Medium Risk (04/17/2024)    Readmission Risk Interventions    04/18/2024    2:00 PM  Readmission Risk Prevention Plan  Transportation Screening Complete  PCP or Specialist Appt within 5-7 Days Complete  Home Care Screening Complete  Medication Review (RN CM) Complete

## 2024-04-19 NOTE — Progress Notes (Signed)
 Mobility Specialist - Progress Note    04/19/24 1458  Mobility  Activity Pivoted/transferred from chair to bed  Level of Assistance Maximum assist, patient does 25-49%  Assistive Device Front wheel walker  Distance Ambulated (ft) 3 ft  Activity Response Tolerated fair  Mobility Referral Yes  Mobility visit 1 Mobility  Mobility Specialist Start Time (ACUTE ONLY) 1445  Mobility Specialist Stop Time (ACUTE ONLY) 1455  Mobility Specialist Time Calculation (min) (ACUTE ONLY) 10 min   Pt was received in chair and agreed to mobility. Max A from sit to stand, became fatigued. Returned to bed with all needs met. Call bell in reach. Left in room with nurse and family.  [employer] - Mobility Specialist

## 2024-04-20 DIAGNOSIS — N12 Tubulo-interstitial nephritis, not specified as acute or chronic: Secondary | ICD-10-CM | POA: Diagnosis not present

## 2024-04-20 NOTE — Progress Notes (Signed)
 [facility] PROGRESS NOTE    Progress Note  Theresa Arnold  FMW:[id number] DOB: Mar 31, 1932 DOA: 04/17/2024 PCP: Theresa Lynwood ORN, MD     Brief Narrative:   Theresa Arnold is an 89 y.o. female past medical history of COPD, essential hypertension hyperlipidemia, history of diverticulitis with abscess and stricture of the distal colon comes in with severe right flank pain and right lower extremity, was found to have a possible complicated right-sided pyelonephritis and CT scan of the abdomen pelvis revealed no acute intracranial pathology but today showed right femoral vein thrombus, lower extremity Doppler confirms that which was positive for acute DVT.   Assessment/Plan:   Complicated UTI/pyelonephritis Urine culture grew Proteus and E. coli. On IV Rocephin has remained afebrile with sensitivities. Hopefully to skilled nursing facility tomorrow  Right sided lower extremity DVT present on admission: Continue Xarelto.  Hypotension: She was started on aggressive fluid resuscitation. Hypotension is resolved.  Generalized weakness: PT was consulted will need skilled nursing facility upon discharge. Will need skilled nursing facility placement.   DVT prophylaxis: lovenox  Family Communication:none Status is: Inpatient Remains inpatient appropriate because: Acute DVT and possible UTI    Code Status:     Code Status Orders  (From admission, onward)           Start     Ordered   04/18/24 0101  Full code  Continuous       Question:  By:  Answer:  Consent: discussion documented in EHR   04/18/24 0100           Code Status History     Date Active Date Inactive Code Status Order ID Comments User Context   07/20/2020 0458 08/05/2020 0136 Full Code [id number]  Chotiner, Adine RAMAN, MD ED   09/04/2019 1339 09/10/2019 1723 Full Code [id number]  Gladis Cough, MD Inpatient   03/15/2019 2300 04/02/2019 1916 Full Code [id number]  Tobie Jorie SAUNDERS, MD ED         IV Access:    Peripheral IV   Procedures and diagnostic studies:   No results found.    Medical Consultants:   None.   Subjective:    Theresa Arnold relates her appetite has returned.  Objective:    Vitals:   04/19/24 0506 04/19/24 1321 04/19/24 2218 04/20/24 0446  BP: 120/62 (!) 142/51 (!) 122/47 132/60  Pulse: 79 84 82 72  Resp: 20 15 19 18   Temp: 99.1 F (37.3 C) 97.7 F (36.5 C) 99.1 F (37.3 C) 98.2 F (36.8 C)  TempSrc: Oral  Oral Oral  SpO2: 94% 96% 97% 98%  Weight:      Height:       SpO2: 98 %   Intake/Output Summary (Last 24 hours) at 04/20/2024 1119 Last data filed at 04/20/2024 1000 Gross per 24 hour  Intake 800 ml  Output 400 ml  Net 400 ml   Filed Weights   04/17/24 1221 04/18/24 0158  Weight: 56.2 kg 56.5 kg    Exam: General exam: In no acute distress. Respiratory system: Good air movement and clear to auscultation. Cardiovascular system: S1 & S2 heard, RRR. No JVD. Gastrointestinal system: Abdomen is nondistended, soft and nontender.  Extremities: No pedal edema. Skin: No rashes, lesions or ulcers Psychiatry: Judgement and insight appear normal. Mood & affect appropriate.  Data Reviewed:    Labs: Basic Metabolic Panel: Recent Labs  Lab 04/17/24 1307 04/17/24 1924 04/18/24 0355 04/19/24 0352  NA 139 136 138 136  K 3.5 4.2  3.6 3.7  CL 102 99 106 102  CO2  --  23 20* 19*  GLUCOSE 135* 108* 86 72  BUN 13 12 12 12   CREATININE 0.70 0.73 0.62 0.62  CALCIUM   --  9.7 8.4* 8.9  MG  --   --  2.0 2.1  PHOS  --   --  3.5 2.9   GFR Estimated Creatinine Clearance: 36 mL/min (by C-G formula based on SCr of 0.62 mg/dL). Liver Function Tests: Recent Labs  Lab 04/17/24 1924 04/19/24 0352  AST 19 16  ALT <5 <5  ALKPHOS 63 58  BILITOT 0.9 0.4  PROT 7.4 6.7  ALBUMIN  3.8 3.1*   No results for input(s): LIPASE, AMYLASE in the last 168 hours. No results for input(s): AMMONIA in the last 168 hours. Coagulation profile No results for  input(s): INR, PROTIME in the last 168 hours. COVID-19 Labs  No results for input(s): DDIMER, FERRITIN, LDH, CRP in the last 72 hours.  Lab Results  Component Value Date   SARSCOV2NAA NEGATIVE 07/20/2020   SARSCOV2NAA NEGATIVE 07/17/2020   SARSCOV2NAA NEGATIVE 09/02/2019   SARSCOV2NAA NOT DETECTED 08/16/2019    CBC: Recent Labs  Lab 04/17/24 1243 04/17/24 1307 04/18/24 0355 04/19/24 0352  WBC 10.2  --  9.7 9.2  NEUTROABS 8.2*  --   --  7.3  HGB 14.0 13.6 11.1* 11.7*  HCT 44.9 40.0 35.2* 38.0  MCV 94.9  --  98.3 98.4  PLT 138*  --  121* 127*   Cardiac Enzymes: No results for input(s): CKTOTAL, CKMB, CKMBINDEX, TROPONINI in the last 168 hours. BNP (last 3 results) No results for input(s): PROBNP in the last 8760 hours. CBG: No results for input(s): GLUCAP in the last 168 hours. D-Dimer: No results for input(s): DDIMER in the last 72 hours. Hgb A1c: No results for input(s): HGBA1C in the last 72 hours. Lipid Profile: No results for input(s): CHOL, HDL, LDLCALC, TRIG, CHOLHDL, LDLDIRECT in the last 72 hours. Thyroid  function studies: No results for input(s): TSH, T4TOTAL, T3FREE, THYROIDAB in the last 72 hours.  Invalid input(s): FREET3 Anemia work up: No results for input(s): VITAMINB12, FOLATE, FERRITIN, TIBC, IRON, RETICCTPCT in the last 72 hours. Sepsis Labs: Recent Labs  Lab 04/17/24 1243 04/18/24 0101 04/18/24 0355 04/19/24 0352  WBC 10.2  --  9.7 9.2  LATICACIDVEN  --  0.7  --   --    Microbiology Recent Results (from the past 240 hours)  Urine Culture (for pregnant, neutropenic or urologic patients or patients with an indwelling urinary catheter)     Status: Abnormal (Preliminary result)   Collection Time: 04/18/24  5:14 AM   Specimen: Urine, Clean Catch  Result Value Ref Range Status   Specimen Description   Final    URINE, CLEAN CATCH Performed at [facility], [address] [place], [place] [address]    Special Requests   Final    NONE Performed at [facility], [address], [place], [place] [address]    Culture (A)  Final    >=100,000 COLONIES/mL ESCHERICHIA COLI >=100,000 COLONIES/mL PROTEUS MIRABILIS SUSCEPTIBILITIES TO FOLLOW Performed at [facility], [address] [place], [place] [address]    Report Status PENDING  Incomplete  Urine Culture     Status: Abnormal (Preliminary result)   Collection Time: 04/18/24  7:43 AM   Specimen: Urine, Random  Result Value Ref Range Status   Specimen Description   Final    URINE,  RANDOM Performed at [facility], [address] [place], [place] [address]    Special Requests   Final    NONE Reflexed from [id number] Performed at [facility], [address] [place], [place] [address]    Culture (A)  Final    >=100,000 COLONIES/mL PROTEUS MIRABILIS SUSCEPTIBILITIES TO FOLLOW CULTURE REINCUBATED FOR BETTER GROWTH Performed at [facility], [address] [place], [place] [address]    Report Status PENDING  Incomplete  Culture, blood (Routine X 2) w Reflex to ID Panel     Status: None (Preliminary result)   Collection Time: 04/19/24 10:39 AM   Specimen: BLOOD RIGHT ARM  Result Value Ref Range Status   Specimen Description   Final    BLOOD RIGHT ARM Performed at [facility], [address] [place], [place] [address]    Special Requests   Final    BOTTLES DRAWN AEROBIC AND ANAEROBIC Blood Culture adequate volume Performed at [facility], [address] [place], [place] [address]    Culture   Final    NO GROWTH < 24 HOURS Performed at [facility], [address] [place], [place] [address]    Report Status PENDING  Incomplete  Culture, blood (Routine X 2) w Reflex to ID Panel     Status: None (Preliminary result)   Collection Time: 04/19/24 10:39 AM   Specimen: BLOOD RIGHT HAND  Result  Value Ref Range Status   Specimen Description   Final    BLOOD RIGHT HAND Performed at [facility], [address] [place], [place] [address]    Special Requests   Final    BOTTLES DRAWN AEROBIC AND ANAEROBIC Blood Culture adequate volume Performed at [facility], [address], [place], [place] [address]    Culture   Final    NO GROWTH < 24 HOURS Performed at [facility], [address], [place], [place] [address]    Report Status PENDING  Incomplete     Medications:    brexpiprazole   0.5 mg Oral BID   potassium chloride   10 mEq Oral Daily   Rivaroxaban  15 mg Oral BID WC   Followed by   NOREEN ON 05/08/2024] rivaroxaban  20 mg Oral Daily   Continuous Infusions:  cefTRIAXone (ROCEPHIN)  IV 2 g (04/20/24 0154)      LOS: 2 days   Erle Odell Castor  [facility]  04/20/2024, 11:19 AM

## 2024-04-20 NOTE — Progress Notes (Signed)
 Physical Therapy Treatment Patient Details Name: Theresa Arnold MRN: [id number] DOB: [birth year] Today's Date: 04/20/2024   History of Present Illness 89 yo female presents to therapy following hospital admission on 04/17/2024 due to LE and R flank pain impacting pt ability to ambulate. Pt was found to have R LE DVT and pyelonephritis/UTI. Pt PMH includes but is not limited to: COPD, falls, cervical spondylosis with radiculopathy, HTN, diverticulitis, HLD, sigmoid stricture, colostomy and colostomy reversal.    PT Comments  Pt is making slow but steady gains with PT. Tolerating incr activity this session, able to sit EOB, participate in wt shifting and light LE exercises followed by repeated STS. Continues to require +2 assist for STS transfers. D/c plan remains appropriate at this time    If plan is discharge home, recommend the following: Two people to help with walking and/or transfers;A lot of help with bathing/dressing/bathroom;Assistance with cooking/housework;Assist for transportation;Help with stairs or ramp for entrance   Can travel by private vehicle     No  Equipment Recommendations  None recommended by PT    Recommendations for Other Services       Precautions / Restrictions Precautions Precautions: Fall Restrictions Weight Bearing Restrictions Per Provider Order: No     Mobility  Bed Mobility Overal bed mobility: Needs Assistance Bed Mobility: Supine to Sit     Supine to sit: Mod assist, +2 for safety/equipment, +2 for physical assistance     General bed mobility comments: assist to progress LEs off bed, elevate trunk, bed pad used to rotate hips    Transfers Overall transfer level: Needs assistance Equipment used: Rolling walker (2 wheels) Transfers: Sit to/from Stand, Bed to chair/wheelchair/BSC Sit to Stand: Mod assist, Max assist, +2 physical assistance, +2 safety/equipment, From elevated surface           General transfer comment: +2 mod assist for   STS x3 from EOB and stedy. transfer from recliner to bed using stedy. multi-modal cues for sequencing, hand and foot position, self assist. pt fearful of falling Transfer via Lift Equipment: Stedy  Ambulation/Gait                   Stairs             Wheelchair Mobility     Tilt Bed    Modified Rankin (Stroke Patients Only)       Balance Overall balance assessment: Needs assistance Sitting-balance support: Bilateral upper extremity supported, No upper extremity supported, Feet supported Sitting balance-Leahy Scale: Fair Sitting balance - Comments: with time pt was able to progress to Fair sit balance, initially with posterior and L lateral lean, requiring min to mod assist. pt participated in wt shifting laterally and anteriorly with CGA for safety Postural control: Posterior lean, Left lateral lean Standing balance support: Bilateral upper extremity supported, During functional activity, Reliant on assistive device for balance Standing balance-Leahy Scale: Zero                              Communication Communication Communication: Impaired Factors Affecting Communication: Hearing impaired  Cognition Arousal: Alert Behavior During Therapy: WFL for tasks assessed/performed   PT - Cognitive impairments: History of cognitive impairments, Memory, Attention, Initiation, Sequencing                       PT - Cognition Comments: fearful of falling, needs repetitious cues Following commands: Impaired Following commands impaired: Follows one  step commands inconsistently, Follows one step commands with increased time    Cueing Cueing Techniques: Verbal cues, Tactile cues, Visual cues  Exercises General Exercises - Lower Extremity Ankle Circles/Pumps: AAROM, Both, 10 reps Long Arc Quad: AROM, Seated, 5 reps, Both    General Comments        Pertinent Vitals/Pain Pain Assessment Pain Assessment: Faces Faces Pain Scale: Hurts even  more Pain Location: generalized with movement Pain Descriptors / Indicators: Grimacing Pain Intervention(s): Limited activity within patient's tolerance, Monitored during session, Repositioned    Home Living                          Prior Function            PT Goals (current goals can now be found in the care plan section) Acute Rehab PT Goals Patient Stated Goal: to not be in pain PT Goal Formulation: With patient Time For Goal Achievement: 05/02/24 Potential to Achieve Goals: Good Progress towards PT goals: Progressing toward goals    Frequency    Min 2X/week      PT Plan      Co-evaluation              AM-PAC PT 6 Clicks Mobility   Outcome Measure  Help needed turning from your back to your side while in a flat bed without using bedrails?: A Lot Help needed moving from lying on your back to sitting on the side of a flat bed without using bedrails?: Total Help needed moving to and from a bed to a chair (including a wheelchair)?: Total Help needed standing up from a chair using your arms (e.g., wheelchair or bedside chair)?: Total Help needed to walk in hospital room?: Total Help needed climbing 3-5 steps with a railing? : Total 6 Click Score: 7    End of Session Equipment Utilized During Treatment: Gait belt Activity Tolerance: Patient limited by fatigue Patient left: with call bell/phone within reach;in chair;with chair alarm set Nurse Communication: Mobility status;Need for lift equipment (stedy) PT Visit Diagnosis: Unsteadiness on feet (R26.81);Other abnormalities of gait and mobility (R26.89);Muscle weakness (generalized) (M62.81);History of falling (Z91.81);Difficulty in walking, not elsewhere classified (R26.2)     Time: 8643-8584 PT Time Calculation (min) (ACUTE ONLY): 19 min  Charges:    $Therapeutic Activity: 8-22 mins PT General Charges $$ ACUTE PT VISIT: 1 Visit                     Mosi Hannold, PT  Acute Rehab Dept [facility])  [phone]  04/20/2024    [facility] 04/20/2024, 3:09 PM

## 2024-04-20 NOTE — Plan of Care (Signed)

## 2024-04-21 DIAGNOSIS — Z9181 History of falling: Secondary | ICD-10-CM | POA: Diagnosis not present

## 2024-04-21 DIAGNOSIS — N390 Urinary tract infection, site not specified: Secondary | ICD-10-CM | POA: Diagnosis not present

## 2024-04-21 DIAGNOSIS — M545 Low back pain, unspecified: Secondary | ICD-10-CM | POA: Diagnosis not present

## 2024-04-21 DIAGNOSIS — F03B4 Unspecified dementia, moderate, with anxiety: Secondary | ICD-10-CM | POA: Diagnosis not present

## 2024-04-21 DIAGNOSIS — Z7401 Bed confinement status: Secondary | ICD-10-CM | POA: Diagnosis not present

## 2024-04-21 DIAGNOSIS — N12 Tubulo-interstitial nephritis, not specified as acute or chronic: Secondary | ICD-10-CM | POA: Diagnosis not present

## 2024-04-21 DIAGNOSIS — F4322 Adjustment disorder with anxiety: Secondary | ICD-10-CM | POA: Diagnosis not present

## 2024-04-21 DIAGNOSIS — I10 Essential (primary) hypertension: Secondary | ICD-10-CM | POA: Diagnosis not present

## 2024-04-21 DIAGNOSIS — N10 Acute tubulo-interstitial nephritis: Secondary | ICD-10-CM | POA: Diagnosis not present

## 2024-04-21 DIAGNOSIS — J449 Chronic obstructive pulmonary disease, unspecified: Secondary | ICD-10-CM | POA: Diagnosis not present

## 2024-04-21 DIAGNOSIS — R2689 Other abnormalities of gait and mobility: Secondary | ICD-10-CM | POA: Diagnosis not present

## 2024-04-21 MED ORDER — RIVAROXABAN 20 MG PO TABS: 20.0000 mg | ORAL_TABLET | Freq: Every day | ORAL | 0 refills | Status: DC

## 2024-04-21 MED ORDER — RIVAROXABAN 15 MG PO TABS: 15.0000 mg | ORAL_TABLET | Freq: Two times a day (BID) | ORAL | 0 refills | Status: AC

## 2024-04-21 MED ORDER — RIVAROXABAN 2.5 MG PO TABS: ORAL_TABLET | ORAL | Status: DC

## 2024-04-21 MED ORDER — CEFPODOXIME PROXETIL 200 MG PO TABS: 200.0000 mg | ORAL_TABLET | Freq: Two times a day (BID) | ORAL | Status: AC

## 2024-04-21 NOTE — TOC Transition Note (Signed)
 Transition of Care [facility] - Discharge Note   Patient Details  Name: Theresa Arnold MRN: [id number] Date of Birth: 31-Jan-1932  Transition of Care [facility]) CM/SW Contact:  Theresa DELENA Saltness, LCSW Phone Number: 04/21/2024, 11:37 AM   Clinical Narrative:    Pt discharging to [facility] for short-term SNF placement today. Pt accepted to room 211. D/C packet placed in pt's chart at RN station. RN to call report to [phone]. PTAR called at 11:51 AM. Pt's son, Theresa Arnold, made aware and in agreement with discharge plan. No further TOC needs at this time.   Final next level of care: Skilled Nursing Facility Barriers to Discharge: Barriers Resolved   Patient Goals and CMS Choice Patient states their goals for this hospitalization and ongoing recovery are:: To return to [facility] CMS Medicare.gov Compare Post Acute Care list provided to:: Patient Represenative (must comment) (Pt's son) Choice offered to / list presented to : Adult Children [place] ownership interest in [facility].provided to:: Adult Children    Discharge Placement  [facility]            [facility] at: [facility] Patient to be transferred to facility by: PTAR Name of family member notified: Pt's son, Theresa Arnold Patient and family notified of of transfer: 04/21/24  Discharge Plan and Services Additional resources added to the After Visit Summary for  Follow Up                DME Arranged: N/A DME Agency: NA       HH Arranged: NA HH Agency: NA        Social Drivers of Health (SDOH) Interventions SDOH Screenings   Food Insecurity: Patient Unable To Answer (04/18/2024)  Transportation Needs: No Transportation Needs (04/18/2024)  Utilities: Patient Unable To Answer (04/18/2024)  Depression (PHQ2-9): Low Risk  (12/03/2023)  Social Connections: Patient Unable To Answer (04/18/2024)  Tobacco Use: Medium Risk (04/17/2024)      Readmission Risk Interventions    04/18/2024    2:00 PM  Readmission Risk Prevention Plan  Transportation Screening Complete  PCP or Specialist Appt within 5-7 Days Complete  Home Care Screening Complete  Medication Review (RN CM) Complete    Signed: Heather Arnold, MSW, LCSW Clinical Social Worker Inpatient Care Management 04/21/2024 11:52 AM

## 2024-04-21 NOTE — TOC Progression Note (Signed)
 Transition of Care [facility]) - Progression Note    Patient Details  Name: Theresa Arnold MRN: [id number] Date of Birth: October 25, 1931  [facility] CM/SW Contact  Heather DELENA Saltness, LCSW Phone Number: 04/21/2024, 11:02 AM  Clinical Narrative:    Pt's insurance authorization for SNF rehab at [facility] has been approved. Auth ID: [id number]. CSW notified Tracey at [employer]. TOC will await follow up.   Expected Discharge Plan: Skilled Nursing Facility Barriers to Discharge: Continued Medical Work up   Expected Discharge Plan and Services  [facility]       Expected Discharge Date: 04/21/24                   Social Drivers of Health (SDOH) Interventions SDOH Screenings   Food Insecurity: Patient Unable To Answer (04/18/2024)  Transportation Needs: No Transportation Needs (04/18/2024)  Utilities: Patient Unable To Answer (04/18/2024)  Depression (PHQ2-9): Low Risk  (12/03/2023)  Social Connections: Patient Unable To Answer (04/18/2024)  Tobacco Use: Medium Risk (04/17/2024)    Readmission Risk Interventions    04/18/2024    2:00 PM  Readmission Risk Prevention Plan  Transportation Screening Complete  PCP or Specialist Appt within 5-7 Days Complete  Home Care Screening Complete  Medication Review (RN CM) Complete    Signed: Heather Saltness, MSW, LCSW Clinical Social Worker Inpatient Care Management 04/21/2024 11:03 AM

## 2024-04-21 NOTE — Discharge Summary (Signed)
 Physician Discharge Summary  Theresa RAMPERSAUD FMW:[id number] DOB: 04/30/1932 DOA: 04/17/2024  PCP: Norleen Lynwood ORN, MD  Admit date: 04/17/2024 Discharge date: 04/21/2024  Admitted From: Home Disposition:  SNF  Recommendations for Outpatient Follow-up:  Follow up with PCP in 1-2 weeks Please obtain BMP/CBC in one week   Home Health:No Equipment/Devices:None  Discharge Condition:Stable CODE STATUS:Full Diet recommendation: Heart Healthy  Brief/Interim Summary: 89 y.o. female past medical history of COPD, essential hypertension hyperlipidemia, history of diverticulitis with abscess and stricture of the distal colon comes in with severe right flank pain and right lower extremity, was found to have a possible complicated right-sided pyelonephritis and CT scan of the abdomen pelvis revealed no acute intracranial pathology but today showed right femoral vein thrombus, lower extremity Doppler confirms that which was positive for acute DVT.   Discharge Diagnoses:  Principal Problem:   Pyelonephritis Active Problems:   COPD (chronic obstructive pulmonary disease) (HCC)   Acute deep vein thrombosis (DVT) of distal vein of right lower extremity (HCC)   Hypotension  Complicated UTI: Urine culture grew 2 bacteria E. coli and Proteus started empirically on Rocephin. She defervesced. PT evaluated the patient will need skilled nursing facility. She was transition to oral Vantin and should continue for a total of 7 days.  Right-sided lower extremity DVT: She was complaining of right sided calf pain Doppler was done which was positive. She was started on Xarelto.  Hypertension/essential hypertension: Of unclear etiology she will start aggressive IV fluid resuscitation hypotension resolved. Lasix  was discontinued.  Generalized weakness: PT evaluated the patient, she will need to go to skilled nursing facility.  Discharge Instructions  Discharge Instructions     Diet - low sodium heart  healthy   Complete by: As directed    Increase activity slowly   Complete by: As directed    No wound care   Complete by: As directed       Allergies as of 04/21/2024       Reactions   Doxycycline  Nausea And Vomiting        Medication List     TAKE these medications    cefpodoxime 200 MG tablet Commonly known as: VANTIN Take 1 tablet (200 mg total) by mouth 2 (two) times daily for 5 days.   desonide 0.05 % cream Commonly known as: DESOWEN 1 application to affected area Externally Twice a day; Duration: 30 Days   furosemide  20 MG tablet Commonly known as: LASIX  Take 1 tablet (20 mg total) by mouth daily for 5 days.   potassium chloride  10 MEQ tablet Commonly known as: Klor-Con  10 Take 1 tablet (10 mEq total) by mouth daily.   Rexulti  0.5 MG Tabs Generic drug: Brexpiprazole  1 tab by mouth twice per day What changed:  how much to take how to take this when to take this   rivaroxaban 2.5 MG Tabs tablet Commonly known as: XARELTO Take 6 tablets (15 mg total) by mouth 2 (two) times daily with a meal for 16 days, THEN 8 tablets (20 mg total) daily. Start taking on: April 21, 2024        Contact information for after-discharge care     Destination     [facility], [place] .   Service: Skilled Nursing Contact information: [address] [place]  [phone]                    Allergies  Allergen Reactions   Doxycycline  Nausea And Vomiting  Consultations: None   Procedures/Studies: VAS US  LOWER EXTREMITY VENOUS (DVT) (7a-7p) Result Date: 04/18/2024  Lower Venous DVT Study Patient Name:  Theresa Arnold  Date of Exam:   04/17/2024 Medical Rec #: [id number]        Accession #:    [id number] Date of Birth: Sep 26, 1931       Patient Gender: F Patient Age:   29 years Exam Location:  [facility] Procedure:      VAS US  LOWER EXTREMITY VENOUS (DVT) Referring Phys: SCOTT ZACKOWSKI  --------------------------------------------------------------------------------  Indications: Pain, and chest pain.  Comparison Study: Previous study on bilateral lower extremity on 5.16.2025. Performing Technologist: Edilia Elden Appl  Examination Guidelines: A complete evaluation includes B-mode imaging, spectral Doppler, color Doppler, and power Doppler as needed of all accessible portions of each vessel. Bilateral testing is considered an integral part of a complete examination. Limited examinations for reoccurring indications may be performed as noted. The reflux portion of the exam is performed with the patient in reverse Trendelenburg.  +---------+---------------+---------+-----------+----------+--------------+ RIGHT    CompressibilityPhasicitySpontaneityPropertiesThrombus Aging +---------+---------------+---------+-----------+----------+--------------+ CFV      Full           Yes      Yes                                 +---------+---------------+---------+-----------+----------+--------------+ SFJ      Full           Yes      Yes                                 +---------+---------------+---------+-----------+----------+--------------+ FV Prox  Full                                                        +---------+---------------+---------+-----------+----------+--------------+ FV Mid   Partial        Yes      Yes                                 +---------+---------------+---------+-----------+----------+--------------+ FV DistalFull                                                        +---------+---------------+---------+-----------+----------+--------------+ PFV      Full           Yes      Yes                                 +---------+---------------+---------+-----------+----------+--------------+ POP      Full           Yes      Yes                                 +---------+---------------+---------+-----------+----------+--------------+  PTV      Full                                                        +---------+---------------+---------+-----------+----------+--------------+  PERO     Full                                                        +---------+---------------+---------+-----------+----------+--------------+ Deep vein thrombosis noted in the right middle femoral vein. All other segments compressible.  +----+---------------+---------+-----------+----------+--------------+ LEFTCompressibilityPhasicitySpontaneityPropertiesThrombus Aging +----+---------------+---------+-----------+----------+--------------+ CFV Full           Yes      Yes                                 +----+---------------+---------+-----------+----------+--------------+ SFJ Full           Yes      Yes                                 +----+---------------+---------+-----------+----------+--------------+     Summary: RIGHT: - Findings consistent with age indeterminate deep vein thrombosis involving the right femoral vein.  - No cystic structure found in the popliteal fossa.  LEFT: - No evidence of common femoral vein obstruction.   *See table(s) above for measurements and observations. Electronically signed by Debby Robertson on 04/18/2024 at 4:55:14 PM.    Final    CT ABDOMEN PELVIS W CONTRAST Result Date: 04/17/2024 CLINICAL DATA:  Abdominal pain. EXAM: CT ABDOMEN AND PELVIS WITH CONTRAST TECHNIQUE: Multidetector CT imaging of the abdomen and pelvis was performed using the standard protocol following bolus administration of intravenous contrast. RADIATION DOSE REDUCTION: This exam was performed according to the departmental dose-optimization program which includes automated exposure control, adjustment of the mA and/or kV according to patient size and/or use of iterative reconstruction technique. CONTRAST:  OMNIPAQUE  IOHEXOL  300 MG/ML  SOLN COMPARISON:  CT dated 07/20/2020. FINDINGS: Lower chest: No acute abnormality. No  intra-abdominal free air or free fluid. Hepatobiliary: Subcentimeter hypodense focus in the right lobe of the liver is too small to characterize. No biliary dilatation. The gallbladder is unremarkable. Pancreas: Unremarkable. No pancreatic ductal dilatation or surrounding inflammatory changes. Spleen: Normal in size without focal abnormality. Adrenals/Urinary Tract: The adrenal glands are unremarkable. There is no hydronephrosis on either side. There is symmetric enhancement and excretion of contrast by both kidneys. The visualized ureters and urinary bladder appear unremarkable. Stomach/Bowel: Postsurgical changes with anastomotic staple line in the distal colon. There is no bowel obstruction or active inflammation. The appendix is normal. Vascular/Lymphatic: Moderate aortoiliac atherosclerotic disease. Age indeterminate thrombus in the right femoral vein (80/2). Correlation with clinical exam and further evaluation with duplex ultrasound recommended. The IVC is unremarkable. No portal venous gas. There is no adenopathy. Reproductive: Partially calcified small exophytic uterine fibroid. Fluid noted within the vagina. Other: Midline vertical anterior pelvic wall incisional scar. Musculoskeletal: Osteopenia with degenerative changes. No acute osseous pathology. IMPRESSION: 1. No acute intra-abdominal or pelvic pathology. 2. Age indeterminate thrombus in the right femoral vein. Correlation with clinical exam and further evaluation with duplex ultrasound recommended. 3.  Aortic Atherosclerosis (ICD10-I70.0). These results were called by telephone at the time of interpretation on 04/17/2024 at 4:36 pm to Dr Scott Zackowski, who verbally acknowledged these results. Electronically Signed   By: Vanetta Chou M.D.   On: 04/17/2024 16:45   DG Pelvis Portable Result Date: 04/17/2024 CLINICAL DATA:  Right  flank and like pain. EXAM: PORTABLE PELVIS 1-2 VIEWS COMPARISON:  03/18/2019. FINDINGS: Osteopenia. No fracture or  dislocation. Sacroiliac joints are patent. IMPRESSION: No acute findings. Electronically Signed   By: Newell Eke M.D.   On: 04/17/2024 15:57   DG Chest Port 1 View Result Date: 04/17/2024 EXAM: 1 VIEW(S) XRAY OF THE CHEST 04/17/2024 12:47:23 PM COMPARISON: 11/26/2023 CLINICAL HISTORY: pain. pain pain. pain FINDINGS: LUNGS AND PLEURA: No focal pulmonary opacity. No pulmonary edema. No pleural effusion. No pneumothorax. HEART AND MEDIASTINUM: Ectasia aorta. No acute abnormality of the cardiac and mediastinal silhouettes. BONES AND SOFT TISSUES: No acute osseous abnormality. IMPRESSION: 1. No acute cardiopulmonary process. 2. Aortic ectasia. Electronically signed by: Norleen Boxer MD 04/17/2024 01:31 PM EDT RP Workstation: [id number]    Subjective: No complaints  Discharge Exam: Vitals:   04/20/24 2110 04/21/24 0550  BP: (!) 113/53 (!) 110/57  Pulse: 85 79  Resp: 17 16  Temp: 99.5 F (37.5 C) 98.9 F (37.2 C)  SpO2: 95% 97%   Vitals:   04/20/24 0446 04/20/24 1300 04/20/24 2110 04/21/24 0550  BP: 132/60 (!) 117/59 (!) 113/53 (!) 110/57  Pulse: 72 77 85 79  Resp: 18 16 17 16   Temp: 98.2 F (36.8 C) 98.4 F (36.9 C) 99.5 F (37.5 C) 98.9 F (37.2 C)  TempSrc: Oral Oral Oral Oral  SpO2: 98% 99% 95% 97%  Weight:      Height:        General: Pt is alert, awake, not in acute distress Cardiovascular: RRR, S1/S2 +, no rubs, no gallops Respiratory: CTA bilaterally, no wheezing, no rhonchi Abdominal: Soft, NT, ND, bowel sounds + Extremities: no edema, no cyanosis    The results of significant diagnostics from this hospitalization (including imaging, microbiology, ancillary and laboratory) are listed below for reference.     Microbiology: Recent Results (from the past 240 hours)  Urine Culture (for pregnant, neutropenic or urologic patients or patients with an indwelling urinary catheter)     Status: Abnormal (Preliminary result)   Collection Time: 04/18/24  5:14 AM    Specimen: Urine, Clean Catch  Result Value Ref Range Status   Specimen Description   Final    URINE, CLEAN CATCH Performed at [facility], [address] [place], [place] [address]    Special Requests   Final    NONE Performed at [facility], [address], [place], [place] [address]    Culture (A)  Final    >=100,000 COLONIES/mL ESCHERICHIA COLI >=100,000 COLONIES/mL PROTEUS MIRABILIS SUSCEPTIBILITIES TO FOLLOW Performed at [facility], [address], [place], [place] [address]    Report Status PENDING  Incomplete  Urine Culture     Status: Abnormal (Preliminary result)   Collection Time: 04/18/24  7:43 AM   Specimen: Urine, Random  Result Value Ref Range Status   Specimen Description   Final    URINE, RANDOM Performed at [facility], [address], [place], [place] [address]    Special Requests   Final    NONE Reflexed from [id number] Performed at [facility], [address], [place], [place] [address]    Culture (A)  Final    >=100,000 COLONIES/mL PROTEUS MIRABILIS 50,000 COLONIES/mL ESCHERICHIA COLI SUSCEPTIBILITIES TO FOLLOW Performed at [facility], [address], [place], [place] [address]    Report Status PENDING  Incomplete  Culture, blood (Routine X 2) w Reflex to ID Panel  Status: None (Preliminary result)   Collection Time: 04/19/24 10:39 AM   Specimen: BLOOD RIGHT ARM  Result Value Ref Range Status   Specimen Description   Final    BLOOD RIGHT ARM Performed at [facility], [address], [place], [place] [address]    Special Requests   Final    BOTTLES DRAWN AEROBIC AND ANAEROBIC Blood Culture adequate volume Performed at [facility], [address], [place], [place] [address]    Culture   Final    NO GROWTH 2 DAYS Performed at [facility], [address] [place], [place] [address]    Report Status PENDING  Incomplete   Culture, blood (Routine X 2) w Reflex to ID Panel     Status: None (Preliminary result)   Collection Time: 04/19/24 10:39 AM   Specimen: BLOOD RIGHT HAND  Result Value Ref Range Status   Specimen Description   Final    BLOOD RIGHT HAND Performed at [facility], [address], [place], [place] [address]    Special Requests   Final    BOTTLES DRAWN AEROBIC AND ANAEROBIC Blood Culture adequate volume Performed at [facility], [address] [place], [place] [address]    Culture   Final    NO GROWTH 2 DAYS Performed at [facility], [address], [place], [place] [address]    Report Status PENDING  Incomplete     Labs: BNP (last 3 results) Recent Labs    11/26/23 1140  BNP 52.5   Basic Metabolic Panel: Recent Labs  Lab 04/17/24 1307 04/17/24 1924 04/18/24 0355 04/19/24 0352  NA 139 136 138 136  K 3.5 4.2 3.6 3.7  CL 102 99 106 102  CO2  --  23 20* 19*  GLUCOSE 135* 108* 86 72  BUN 13 12 12 12   CREATININE 0.70 0.73 0.62 0.62  CALCIUM   --  9.7 8.4* 8.9  MG  --   --  2.0 2.1  PHOS  --   --  3.5 2.9   Liver Function Tests: Recent Labs  Lab 04/17/24 1924 04/19/24 0352  AST 19 16  ALT <5 <5  ALKPHOS 63 58  BILITOT 0.9 0.4  PROT 7.4 6.7  ALBUMIN  3.8 3.1*   No results for input(s): LIPASE, AMYLASE in the last 168 hours. No results for input(s): AMMONIA in the last 168 hours. CBC: Recent Labs  Lab 04/17/24 1243 04/17/24 1307 04/18/24 0355 04/19/24 0352  WBC 10.2  --  9.7 9.2  NEUTROABS 8.2*  --   --  7.3  HGB 14.0 13.6 11.1* 11.7*  HCT 44.9 40.0 35.2* 38.0  MCV 94.9  --  98.3 98.4  PLT 138*  --  121* 127*   Cardiac Enzymes: No results for input(s): CKTOTAL, CKMB, CKMBINDEX, TROPONINI in the last 168 hours. BNP: Invalid input(s): POCBNP CBG: No results for input(s): GLUCAP in the last 168 hours. D-Dimer No results for input(s): DDIMER in the last 72 hours. Hgb A1c No results for input(s):  HGBA1C in the last 72 hours. Lipid Profile No results for input(s): CHOL, HDL, LDLCALC, TRIG, CHOLHDL, LDLDIRECT in the last 72 hours. Thyroid  function studies No results for input(s): TSH, T4TOTAL, T3FREE, THYROIDAB in the last 72 hours.  Invalid input(s): FREET3 Anemia work up No results for input(s): VITAMINB12, FOLATE, FERRITIN, TIBC, IRON, RETICCTPCT in the last 72 hours. Urinalysis    Component Value Date/Time   COLORURINE YELLOW 04/18/2024  0743   APPEARANCEUR HAZY (A) 04/18/2024 0743   LABSPEC 1.030 04/18/2024 0743   PHURINE 5.0 04/18/2024 0743   GLUCOSEU NEGATIVE 04/18/2024 0743   GLUCOSEU NEGATIVE 08/13/2022 1535   HGBUR MODERATE (A) 04/18/2024 0743   BILIRUBINUR NEGATIVE 04/18/2024 0743   BILIRUBINUR negative 12/22/2023 1435   KETONESUR 5 (A) 04/18/2024 0743   PROTEINUR NEGATIVE 04/18/2024 0743   UROBILINOGEN 0.2 12/22/2023 1435   UROBILINOGEN 0.2 08/13/2022 1535   NITRITE POSITIVE (A) 04/18/2024 0743   LEUKOCYTESUR SMALL (A) 04/18/2024 0743   Sepsis Labs Recent Labs  Lab 04/17/24 1243 04/18/24 0355 04/19/24 0352  WBC 10.2 9.7 9.2   Microbiology Recent Results (from the past 240 hours)  Urine Culture (for pregnant, neutropenic or urologic patients or patients with an indwelling urinary catheter)     Status: Abnormal (Preliminary result)   Collection Time: 04/18/24  5:14 AM   Specimen: Urine, Clean Catch  Result Value Ref Range Status   Specimen Description   Final    URINE, CLEAN CATCH Performed at [facility], [address] [place], [place] [address]    Special Requests   Final    NONE Performed at [facility], [address], [place], [place] [address]    Culture (A)  Final    >=100,000 COLONIES/mL ESCHERICHIA COLI >=100,000 COLONIES/mL PROTEUS MIRABILIS SUSCEPTIBILITIES TO FOLLOW Performed at [facility], [address], [place], [place] [address]    Report Status  PENDING  Incomplete  Urine Culture     Status: Abnormal (Preliminary result)   Collection Time: 04/18/24  7:43 AM   Specimen: Urine, Random  Result Value Ref Range Status   Specimen Description   Final    URINE, RANDOM Performed at [facility], [address], [place], [place] [address]    Special Requests   Final    NONE Reflexed from [id number] Performed at [facility], [address] [place], [place] [address]    Culture (A)  Final    >=100,000 COLONIES/mL PROTEUS MIRABILIS 50,000 COLONIES/mL ESCHERICHIA COLI SUSCEPTIBILITIES TO FOLLOW Performed at [facility], [address], [place], [place] [address]    Report Status PENDING  Incomplete  Culture, blood (Routine X 2) w Reflex to ID Panel     Status: None (Preliminary result)   Collection Time: 04/19/24 10:39 AM   Specimen: BLOOD RIGHT ARM  Result Value Ref Range Status   Specimen Description   Final    BLOOD RIGHT ARM Performed at [facility], [address] [place], [place] [address]    Special Requests   Final    BOTTLES DRAWN AEROBIC AND ANAEROBIC Blood Culture adequate volume Performed at [facility], [address], [place], [place] [address]    Culture   Final    NO GROWTH 2 DAYS Performed at [facility], [address] [place], [place] [address]    Report Status PENDING  Incomplete  Culture, blood (Routine X 2) w Reflex to ID Panel     Status: None (Preliminary result)   Collection Time: 04/19/24 10:39 AM   Specimen: BLOOD RIGHT HAND  Result Value Ref Range Status   Specimen Description   Final    BLOOD RIGHT HAND Performed at [facility], [address], [place], [place] [address]    Special Requests   Final    BOTTLES DRAWN AEROBIC AND ANAEROBIC Blood Culture adequate volume Performed at  [facility], [address], [place], [place] [address]    Culture   Final    NO GROWTH 2 DAYS Performed at  [facility], [address], [place], [place] [address]    Report Status PENDING  Incomplete     Time coordinating discharge: Over 35 minutes  SIGNED:   Erle Odell Castor, MD  [facility] 04/21/2024, 9:45 AM Pager   If 7PM-7AM, please contact night-coverage [email or web address] Password [email or web address]

## 2024-04-22 LAB — URINE CULTURE: Culture: >=100000 — AB

## 2024-04-23 NOTE — Progress Notes (Incomplete)
 Assessment/Plan:     Theresa Arnold is a very pleasant 89 y.o. year old RH female with a history of hypertension, hyperlipidemia, COPD, recent pyelonephritis and UTI due to E. Coli, recent R LE DVT, COPD,  seen today for evaluation of memory loss. MoCA today is  /30***.   At the edition unclear, workup is in progress, although findings are concerning for dementia***. Patient needs assistance with some ADLs.  Patient no longer drives.  Dementia with behavioral disturbance***  MRI brain without contrast to assess for underlying structural abnormality and assess vascular load  Check B12, TSH Recommend good control of cardiovascular risk factors.   Continue to control mood as per PCP Continue Rexulti  as per PCP Fol[place]ow up in    Subjective:    The patient is accompanied by ***  who supplements the history.    How long did patient have memory difficulties?  For the last***.  Patient has difficulty remembering new information, recent conversations and names of people. repeats oneself?  Endorsed Disoriented when walking into a room?  Denies except occasionally not remembering what patient came to the room for ***  Leaving objects in unusual places?   Denies.  Wandering behavior? Denies.   Any personality changes, or depression, anxiety?  Has moments of irritability.*** Hallucinations or paranoia?  Denies.   Seizures? Denies.    Any sleep changes?  Sleeps well***does not sleep well***denies vivid dreams, REM behavior or sleepwalking.   Sleep apnea? Denies.   Any hygiene concerns?  Denies.   Independent of bathing and dressing?  Needs assistance getting dressed.*** Who is in charge of the medications? is in charge *** Who is in charge of the finances?   is in charge   *** Any changes in appetite?   Denies. ***   Patient have trouble swallowing?  Denies.   Does the patient cook?  No***  Any headaches?  Denies.   Any vision changes?  Denies.  Patient has a history of*** Chronic back  pain?  Denies.   Ambulates with difficulty? Needs a walker to ambulate ***   Recent falls or head injuries? Denies.     Vision changes? Denies. Stroke like symptoms?  Denies.   Any tremors?  Denies.   Any anosmia?  Denies.   Any incontinence of urine? Denies.   Any bowel dysfunction? Denies.      Patient lives  SNF currently due to recent hospitalization, for strength an balance. ***  History of heavy alcohol intake? Denies.   History of heavy tobacco use? Denies.   Family history of dementia?   ***Denies. Does patient drive?***No longer drives  Allergies  Allergen Reactions   Doxycycline  Nausea And Vomiting    Current Outpatient Medications  Medication Instructions   Brexpiprazole  (REXULTI ) 0.5 MG TABS 1 tab by mouth twice per day   cefpodoxime (VANTIN) 200 mg, Oral, 2 times daily   desonide (DESOWEN) 0.05 % cream 1 application to affected area Externally Twice a day; Duration: 30 Days   potassium chloride  (KLOR-CON  10) 10 MEQ tablet 10 mEq, Oral, Daily   Rivaroxaban (XARELTO) 15 mg, Oral, 2 times daily with meals   [START ON 05/08/2024] rivaroxaban (XARELTO) 20 mg, Oral, Daily with supper     VITALS:  There were no vitals filed for this visit.         No data to display              No data to display  PHYSICAL EXAM   HEENT:  Normocephalic, atraumatic. The mucous membranes are moist. The superficial temporal arteries are without ropiness or tenderness. Cardiovascular: Regular rate and rhythm. Lungs: Clear to auscultation bilaterally. Neck: There are no carotid bruits noted bilaterally. Orientation:  Alert and oriented to person, not to place and time***. No aphasia or dysarthria. Fund of knowledge is reduced. Recent and remote memory impaired.  Attention and concentration are reduced.  Able to name objects and unable to repeat phrases. Delayed recall    Cranial nerves: There is good facial symmetry. Extraocular muscles are intact and visual fields are  full to confrontational testing. Speech is fluent and clear, no tongue deviation. Hearing is intact to conversational tone.*** Tone: Tone is good throughout. Sensation: Sensation is intact to light touch and pinprick throughout. Vibration is intact at the bilateral big toe. Coordination: The patient has no difficulty with RAM's or FNF bilaterally. Normal finger to nose  Motor: Strength is 5/5 in the bilateral upper and lower extremities. There is no pronator drift. There are no fasciculations noted. DTR's: Deep tendon reflexes are 2/4 .  Plantar responses are downgoing bilaterally. Gait and Station: The patient is able to ambulate with difficulty. Needs a walker to ambulate ***. Gait is cautious and narrow.      Thank you for allowing us  the opportunity to participate in the care of this nice patient. Please do not hesitate to contact us  for any questions or concerns.   Total time spent on today's visit was *** minutes dedicated to this patient today, preparing to see patient, examining the patient, ordering tests and/or medications and counseling the patient, documenting clinical information in the EHR or other health record, independently interpreting results and communicating results to the patient/family, discussing treatment and goals, answering patient's questions and coordinating care.  Cc:  Norleen Lynwood ORN, MD  Camie Sevin 04/23/2024 3:01 PM

## 2024-04-24 LAB — CULTURE, BLOOD (ROUTINE X 2)
Culture: NO GROWTH
Culture: NO GROWTH
Special Requests: ADEQUATE
Special Requests: ADEQUATE

## 2024-04-25 ENCOUNTER — Ambulatory Visit: Payer: Self-pay | Admitting: Internal Medicine

## 2024-04-25 ENCOUNTER — Other Ambulatory Visit: Payer: Self-pay | Admitting: Internal Medicine

## 2024-04-25 ENCOUNTER — Telehealth: Payer: Self-pay

## 2024-04-25 DIAGNOSIS — R269 Unspecified abnormalities of gait and mobility: Secondary | ICD-10-CM

## 2024-04-25 LAB — URINE CULTURE: Culture: >=100000 — AB

## 2024-04-25 MED ORDER — CIPROFLOXACIN HCL 500 MG PO TABS: 500.0000 mg | ORAL_TABLET | Freq: Two times a day (BID) | ORAL | 0 refills | Status: AC

## 2024-04-25 NOTE — Telephone Encounter (Signed)
 Copied from CRM [id number]. Topic: Clinical - Medical Advice >> Apr 25, 2024  1:16 PM Nessti S wrote: Reason for CRM: patient son yancy [phone] said patient will need wheelchair and bedside commode prescription sent to [facility] fax [phone]

## 2024-04-26 NOTE — Addendum Note (Signed)
 Addended by: NORLEEN LYNWOOD ORN on: 04/26/2024 04:55 PM   Modules accepted: Orders

## 2024-04-26 NOTE — Telephone Encounter (Signed)
Ok done hardcopy to cma 

## 2024-04-27 ENCOUNTER — Ambulatory Visit: Admitting: Physician Assistant

## 2024-04-27 ENCOUNTER — Ambulatory Visit

## 2024-04-27 NOTE — Telephone Encounter (Signed)
 Orders were faxed to fax number provided today.

## 2024-04-28 ENCOUNTER — Ambulatory Visit

## 2024-04-28 DIAGNOSIS — Z9181 History of falling: Secondary | ICD-10-CM | POA: Diagnosis not present

## 2024-04-28 DIAGNOSIS — J449 Chronic obstructive pulmonary disease, unspecified: Secondary | ICD-10-CM | POA: Diagnosis not present

## 2024-04-28 DIAGNOSIS — N390 Urinary tract infection, site not specified: Secondary | ICD-10-CM | POA: Diagnosis not present

## 2024-04-28 DIAGNOSIS — I10 Essential (primary) hypertension: Secondary | ICD-10-CM | POA: Diagnosis not present

## 2024-05-09 ENCOUNTER — Telehealth: Payer: Self-pay

## 2024-05-09 NOTE — Transitions of Care (Post Inpatient/ED Visit) (Unsigned)
   05/09/2024  Name: Theresa Arnold MRN: [id number] DOB: [birth year]  Today's TOC FU Call Status: Today's TOC FU Call Status:: Unsuccessful Call (1st Attempt) Unsuccessful Call (1st Attempt) Date: 05/09/24  Attempted to reach the patient regarding the most recent Inpatient/ED visit.  Follow Up Plan: Additional outreach attempts will be made to reach the patient to complete the Transitions of Care (Post Inpatient/ED visit) call.   Signature Julian Lemmings, LPN [facility] Nurse Health Advisor Direct Dial [phone]

## 2024-05-09 NOTE — Telephone Encounter (Signed)
 Copied from CRM [id number]. Topic: General - Call Back - No Documentation >> May 09, 2024  1:51 PM Zy'onna H wrote: Patient representative (Son) called in on behalf of Miss. [place] returning a phone call from [place], Julian, LPN regarding a TOC Request >  [facility] Nurse Health Advisor.  I called the CAL and they did not assist, I was able to find Hamilton's phone number on my own.  I left a detailed message regarding her sons concerns and gave the respective callback number.   **Please give him a callback at your earliest convenience**

## 2024-05-10 DIAGNOSIS — E785 Hyperlipidemia, unspecified: Secondary | ICD-10-CM | POA: Diagnosis not present

## 2024-05-10 DIAGNOSIS — Z9181 History of falling: Secondary | ICD-10-CM | POA: Diagnosis not present

## 2024-05-10 DIAGNOSIS — I959 Hypotension, unspecified: Secondary | ICD-10-CM | POA: Diagnosis not present

## 2024-05-10 DIAGNOSIS — Z87440 Personal history of urinary (tract) infections: Secondary | ICD-10-CM | POA: Diagnosis not present

## 2024-05-10 DIAGNOSIS — F0393 Unspecified dementia, unspecified severity, with mood disturbance: Secondary | ICD-10-CM | POA: Diagnosis not present

## 2024-05-10 DIAGNOSIS — I10 Essential (primary) hypertension: Secondary | ICD-10-CM | POA: Diagnosis not present

## 2024-05-10 DIAGNOSIS — J449 Chronic obstructive pulmonary disease, unspecified: Secondary | ICD-10-CM | POA: Diagnosis not present

## 2024-05-10 DIAGNOSIS — I82401 Acute embolism and thrombosis of unspecified deep veins of right lower extremity: Secondary | ICD-10-CM | POA: Diagnosis not present

## 2024-05-10 NOTE — Transitions of Care (Post Inpatient/ED Visit) (Signed)
   05/10/2024  Name: Theresa Arnold MRN: [id number] DOB: July 20, 1931  Today's TOC FU Call Status: Today's TOC FU Call Status:: Successful TOC FU Call Completed Unsuccessful Call (1st Attempt) Date: 05/09/24 [facility] FU Call Complete Date: 05/10/24 Patient's Name and Date of Birth confirmed.  Transition Care Management Follow-up Telephone Call Date of Discharge: 05/08/24 Discharge Facility: Other ([facility]) Name of Other ([facility]) Discharge Facility: [facility] Type of Discharge: Inpatient Admission Primary Inpatient Discharge Diagnosis:: COPD How have you been since you were released from the hospital?: Better Any questions or concerns?: No  Items Reviewed: Did you receive and understand the discharge instructions provided?: Yes Medications obtained,verified, and reconciled?: Yes (Medications Reviewed) Any new allergies since your discharge?: No Dietary orders reviewed?: Yes Do you have support at home?: Yes People in Home [RPT]: child(ren), adult  Medications Reviewed Today: Medications Reviewed Today     Reviewed by Emmitt Pan, LPN (Licensed Practical Nurse) on 05/10/24 at 1207  Med List Status: <None>   Medication Order Taking? Sig Documenting Provider Last Dose Status Informant  Brexpiprazole  (REXULTI ) 0.5 MG TABS [id number] Yes 1 tab by mouth twice per day  Patient taking differently: Take 0.5 mg by mouth in the morning and at bedtime. 1 tab by mouth twice per day   Norleen Lynwood ORN, MD  Active Child, Pharmacy Records  desonide (DESOWEN) 0.05 % cream [id number]  1 application to affected area Externally Twice a day; Duration: 30 Days  Patient not taking: Reported on 05/10/2024   [provider]  Active Child, Pharmacy Records  potassium chloride  (KLOR-CON  10) 10 MEQ tablet [id number] Yes Take 1 tablet (10 mEq total) by mouth daily. Norleen Lynwood ORN, MD  Active Child, Pharmacy Records  Rivaroxaban TAD) 15 MG TABS tablet [id number] Yes Take 1 tablet (15 mg  total) by mouth 2 (two) times daily with a meal for 17 days. Odell Celinda Balo, MD  Active   rivaroxaban (XARELTO) 20 MG TABS tablet [id number] Yes Take 1 tablet (20 mg total) by mouth daily with supper. Odell Celinda Balo, MD  Active             Home Care and Equipment/Supplies: Were Home Health Services Ordered?: Yes Name of Home Health Agency:: [facility] Has Agency set up a time to come to your home?: Yes First Home Health Visit Date: 05/10/24 Any new equipment or medical supplies ordered?: NA  Functional Questionnaire: Do you need assistance with bathing/showering or dressing?: Yes Do you need assistance with meal preparation?: Yes Do you need assistance with eating?: No Do you have difficulty maintaining continence: Yes Do you need assistance with getting out of bed/getting out of a chair/moving?: No Do you have difficulty managing or taking your medications?: Yes  Follow up appointments reviewed: PCP Follow-up appointment confirmed?: Yes Date of PCP follow-up appointment?: 05/23/24 Follow-up Provider: [facility] Follow-up appointment confirmed?: Yes Date of Specialist follow-up appointment?: 07/19/24 Follow-Up Specialty Provider:: neuro Do you need transportation to your follow-up appointment?: No Do you understand care options if your condition(s) worsen?: Yes-patient verbalized understanding    SIGNATURE Pan Emmitt, LPN [facility] Nurse Health Advisor Direct Dial [phone]

## 2024-05-12 ENCOUNTER — Telehealth: Payer: Self-pay

## 2024-05-12 ENCOUNTER — Emergency Department (HOSPITAL_COMMUNITY)
Admission: EM | Admit: 2024-05-12 | Discharge: 2024-05-12 | Disposition: A | Discharge status: Home / Self Care | Attending: Emergency Medicine | Admitting: Emergency Medicine

## 2024-05-12 DIAGNOSIS — J449 Chronic obstructive pulmonary disease, unspecified: Secondary | ICD-10-CM | POA: Diagnosis not present

## 2024-05-12 DIAGNOSIS — Z7401 Bed confinement status: Secondary | ICD-10-CM | POA: Diagnosis not present

## 2024-05-12 DIAGNOSIS — L89159 Pressure ulcer of sacral region, unspecified stage: Secondary | ICD-10-CM | POA: Diagnosis not present

## 2024-05-12 DIAGNOSIS — Z7901 Long term (current) use of anticoagulants: Secondary | ICD-10-CM | POA: Diagnosis not present

## 2024-05-12 DIAGNOSIS — K921 Melena: Secondary | ICD-10-CM | POA: Diagnosis present

## 2024-05-12 DIAGNOSIS — R531 Weakness: Secondary | ICD-10-CM | POA: Diagnosis not present

## 2024-05-12 LAB — CBC WITH DIFFERENTIAL/PLATELET
Abs Immature Granulocytes: 0.01 K/uL (ref 0.00–0.07)
Basophils Absolute: 0 K/uL (ref 0.0–0.1)
Basophils Relative: 1 %
Eosinophils Absolute: 0.1 K/uL (ref 0.0–0.5)
Eosinophils Relative: 1 %
HCT: 35.8 % — ABNORMAL LOW (ref 36.0–46.0)
Hemoglobin: 11 g/dL — ABNORMAL LOW (ref 12.0–15.0)
Immature Granulocytes: 0 %
Lymphocytes Relative: 17 %
Lymphs Abs: 1.5 K/uL (ref 0.7–4.0)
MCH: 29.8 pg (ref 26.0–34.0)
MCHC: 30.7 g/dL (ref 30.0–36.0)
MCV: 97 fL (ref 80.0–100.0)
Monocytes Absolute: 0.5 K/uL (ref 0.1–1.0)
Monocytes Relative: 6 %
Neutro Abs: 6.5 K/uL (ref 1.7–7.7)
Neutrophils Relative %: 75 %
Platelets: 181 K/uL (ref 150–400)
RBC: 3.69 MIL/uL — ABNORMAL LOW (ref 3.87–5.11)
RDW: 15.2 % (ref 11.5–15.5)
WBC: 8.6 K/uL (ref 4.0–10.5)
nRBC: 0 % (ref 0.0–0.2)

## 2024-05-12 LAB — BASIC METABOLIC PANEL WITH GFR
Anion gap: 12 (ref 5–15)
BUN: 8 mg/dL (ref 8–23)
CO2: 25 mmol/L (ref 22–32)
Calcium: 9.5 mg/dL (ref 8.9–10.3)
Chloride: 105 mmol/L (ref 98–111)
Creatinine, Ser: 0.49 mg/dL (ref 0.44–1.00)
GFR, Estimated: >60 mL/min (ref >60)
Glucose, Bld: 84 mg/dL (ref 70–99)
Potassium: 3.6 mmol/L (ref 3.5–5.1)
Sodium: 142 mmol/L (ref 135–145)

## 2024-05-12 LAB — POC OCCULT BLOOD, ED: Fecal Occult Bld: NEGATIVE

## 2024-05-12 MED ORDER — BARRIER CREAM NON-SPECIFIED: 1.0000 | TOPICAL_CREAM | Freq: Two times a day (BID) | TOPICAL | Status: DC | PRN

## 2024-05-12 MED ORDER — FOAM DRESSING BORDERED EX PADS: 1.0000 | MEDICATED_PAD | Freq: Two times a day (BID) | CUTANEOUS | 0 refills | Status: AC

## 2024-05-12 NOTE — Discharge Instructions (Addendum)
 You were seen in the emergency room with concerns for rectal bleeding.  Our workup does not show any evidence of rectal bleeding.  We did notice that you had skin breakdown over the gluteal folds, with bleeding from that site.  It is very likely, that wiping with salted in bloody show from the skin site.  We recommend that there is no wiping of the gluteal fold until that area has healed.  You may gently dab on that area with a soft washcloth.  Apply barrier cream such as Desitin or zinc oxide to the skin. Please purchase nonadhesive, supportive bandage

## 2024-05-12 NOTE — ED Triage Notes (Signed)
 BIB Ems from home. Caregiver was wiping patient after a BM and blood was showing on the tissue, worsening after each wipe. Patient on Xarelto. Hx dementia. No blood in stool.

## 2024-05-12 NOTE — Telephone Encounter (Signed)
 Copied from CRM #[id number]. Topic: Clinical - Home Health Verbal Orders >> May 12, 2024 10:59 AM Franky GRADE wrote: Caller/Agency: Wanda GOWERS [facility] Callback Number: [phone] Service Requested: Physical Therapy Frequency: 2 week 2, 1 week 6  Any new concerns about the patient? No

## 2024-05-12 NOTE — ED Provider Notes (Signed)
 [place] EMERGENCY DEPARTMENT AT [facility] Provider Note   CSN: [id number] Arrival date & time: 05/12/24  9045     Patient presents with: No chief complaint on file.   Theresa Arnold is a 89 y.o. female.   HPI     89 year old patient comes in with chief complaint of bloody stools.  Patient has a history of diverticulosis, COPD and is on Xarelto.  She was sent to the emergency room because of bleeding that was noted by home health staff and they were wiping her.  Patient denies any abdominal pain and reports no evidence of recent blood that she has seen in the stool.    Prior to Admission medications   Medication Sig Start Date End Date Taking? Authorizing Provider  Wound Dressings (FOAM DRESSING BORDERED) PADS Apply 1 each topically in the morning and at bedtime. 05/12/24  Yes Charlyn Sora, MD  Brexpiprazole  (REXULTI ) 0.5 MG TABS 1 tab by mouth twice per day Patient taking differently: Take 0.5 mg by mouth in the morning and at bedtime. 1 tab by mouth twice per day 01/24/24   Norleen Lynwood ORN, MD  desonide (DESOWEN) 0.05 % cream 1 application to affected area Externally Twice a day; Duration: 30 Days Patient not taking: Reported on 05/10/2024 05/22/14   [provider]  potassium chloride  (KLOR-CON  10) 10 MEQ tablet Take 1 tablet (10 mEq total) by mouth daily. 01/24/24   Norleen Lynwood ORN, MD  Rivaroxaban (XARELTO) 15 MG TABS tablet Take 1 tablet (15 mg total) by mouth 2 (two) times daily with a meal for 17 days. 04/21/24 05/10/24  Odell Celinda Balo, MD  rivaroxaban (XARELTO) 20 MG TABS tablet Take 1 tablet (20 mg total) by mouth daily with supper. 05/08/24   Odell Celinda Balo, MD    Allergies: Doxycycline     Review of Systems  All other systems reviewed and are negative.   Updated Vital Signs BP (!) 142/59 (BP Location: Right Arm)   Pulse 85   Temp (!) 97.3 F (36.3 C) (Oral)   Resp 19   SpO2 97%   Physical Exam Vitals and nursing note  reviewed. Exam conducted with a chaperone present.  Constitutional:      Appearance: She is well-developed.  HENT:     Head: Atraumatic.  Cardiovascular:     Rate and Rhythm: Normal rate.  Pulmonary:     Effort: Pulmonary effort is normal.  Abdominal:     Tenderness: There is no abdominal tenderness.     Comments: Pt has skin breakdown in the gluteal fold, with some bleeding. No hemorrhoid  Musculoskeletal:     Cervical back: Normal range of motion and neck supple.  Skin:    General: Skin is warm and dry.  Neurological:     Mental Status: She is alert and oriented to person, place, and time.     (all labs ordered are listed, but only abnormal results are displayed) Labs Reviewed  CBC WITH DIFFERENTIAL/PLATELET - Abnormal; Notable for the following components:      Result Value   RBC 3.69 (*)    Hemoglobin 11.0 (*)    HCT 35.8 (*)    All other components within normal limits  BASIC METABOLIC PANEL WITH GFR  POC OCCULT BLOOD, ED    EKG: EKG Interpretation Date/Time:  Friday May 12 2024 10:07:27 EDT Ventricular Rate:  82 PR Interval:  177 QRS Duration:  105 QT Interval:  398 QTC Calculation: 465 R Axis:   -  62  Text Interpretation: Sinus rhythm Left anterior fascicular block Probable left ventricular hypertrophy Anterior Q waves, possibly due to LVH Confirmed by Charlyn Sora [phone]) on 05/12/2024 12:38:46 PM  Radiology: No results found.   Procedures   Medications Ordered in the ED  barrier cream (non-specified) 1 Application (has no administration in time range)                                    Medical Decision Making Amount and/or Complexity of Data Reviewed Labs: ordered.  Risk OTC drugs.   Pt comes in w/ cc of questionable bloody stool. Patient has history of diverticulosis.  I reviewed patient records including previous colonoscopy that revealed significant diverticulosis.  Patient is also on Xarelto due to history of DVT.  Patient  abdominal exam is reassuring. On digital rectal exam, patient had brown stool, that is guaiac negative and there was clear evidence of bleeding in the gluteal fold from skin tears.  It is very likely that patient having early signs of sacral ulcer or she has unstageable sacral ulcer at this time.  Will apply barrier ointment and also foam dressing.  Basic labs were ordered and the hemoglobin is completely normal.  I do not think repeat CBC is needed.  She is stable for discharge.   Final diagnoses:  Pressure injury of skin of sacral region, unspecified injury stage    ED Discharge Orders          Ordered    Apply dressing        05/12/24 1421    Wound Dressings (FOAM DRESSING BORDERED) PADS  2 times daily        05/12/24 1509               Charlyn Sora, MD 05/12/24 1518

## 2024-05-13 NOTE — Telephone Encounter (Signed)
 Ok for [employer]

## 2024-05-15 NOTE — Telephone Encounter (Signed)
 Called and gave verbals.

## 2024-05-16 DIAGNOSIS — Z9181 History of falling: Secondary | ICD-10-CM | POA: Diagnosis not present

## 2024-05-23 ENCOUNTER — Telehealth: Payer: Self-pay

## 2024-05-23 ENCOUNTER — Telehealth: Admitting: Internal Medicine

## 2024-05-23 DIAGNOSIS — R296 Repeated falls: Secondary | ICD-10-CM | POA: Diagnosis not present

## 2024-05-23 DIAGNOSIS — F419 Anxiety disorder, unspecified: Secondary | ICD-10-CM | POA: Diagnosis not present

## 2024-05-23 DIAGNOSIS — R269 Unspecified abnormalities of gait and mobility: Secondary | ICD-10-CM

## 2024-05-23 DIAGNOSIS — J449 Chronic obstructive pulmonary disease, unspecified: Secondary | ICD-10-CM

## 2024-05-23 DIAGNOSIS — M1712 Unilateral primary osteoarthritis, left knee: Secondary | ICD-10-CM | POA: Diagnosis not present

## 2024-05-23 DIAGNOSIS — R531 Weakness: Secondary | ICD-10-CM | POA: Diagnosis not present

## 2024-05-23 MED ORDER — BUSPIRONE HCL 5 MG PO TABS: 5.0000 mg | ORAL_TABLET | Freq: Two times a day (BID) | ORAL | 5 refills | Status: DC

## 2024-05-23 MED ORDER — TRAMADOL HCL 50 MG PO TABS: 50.0000 mg | ORAL_TABLET | Freq: Four times a day (QID) | ORAL | 0 refills | Status: DC | PRN

## 2024-05-23 NOTE — Telephone Encounter (Signed)
 Appt has been done

## 2024-05-23 NOTE — Progress Notes (Addendum)
 Patient ID: Theresa Arnold, female   DOB: Apr 23, 1932, 89 y.o.   MRN: [id number]  Virtual Visit via Video Note  I connected with Theresa Arnold on 05/26/24 at  1:20 PM EST by a video enabled telemedicine application and verified that I am speaking with the correct person using two identifiers.  Location of all participants today Patient: at home with daughter Provider: at office   I discussed the limitations of evaluation and management by telemedicine and the availability of in person appointments. The patient expressed understanding and agreed to proceed.  History of Present Illness: Here to f/u after seen at ED oct 31 with pressure ulcer now improving with wound dressings.   Pt does need hospital bed for frequent repositioning.  No fever chills or worsening drainage.  Denies urinary symptoms such as dysuria, frequency, urgency, flank pain, hematuria or n/v, fever, chills after prior hospn oct 6 - 10 with pyelonephritis and acute DVT RLE, started on xarelto.  Pain and swelling to leg now improved.  Denies worsening depressive symptoms, suicidal ideation, or panic; has ongoing anxiety, asking for restart buspar 5 bid as this helped previously.  Does have other chronic neck pain request refill tramadol  prn.  Pt denies chest pain, increased sob or doe, wheezing, orthopnea, PND, increased LE swelling, palpitations, dizziness or syncope.  No overt bleeding .    Also, due to severe generalized weakness, pressure ulcer on the pelvis area, impaired nutrition status, incontinence, altered sensory perception, and compromised circulatory status, patient requires frequent changes to body position and/or has an immediate need for body change in body position.  The patient has a medical condition which requires positioning of the body in ways not feasible with an ordinary bed, and the patient requires positioning of the body in ways not feasible with an ordinary bed to help alleviate chronic neck pain.   Past  Medical History:  Diagnosis Date   CERVICAL RADICULOPATHY, LEFT 09/03/2010   Cervical spondylosis 11/10/2011   COPD (chronic obstructive pulmonary disease) (HCC) 11/10/2011   Diverticulitis    HTN (hypertension) 06/13/2021   HYPERLIPIDEMIA 09/03/2010   Sigmoid stricture (HCC)    Past Surgical History:  Procedure Laterality Date   COLONOSCOPY     COLOSTOMY REVERSAL N/A 09/04/2019   Procedure: PRIMARY COLOCOLOSTOMY REVERSAL AND TAKEDOWN OF HARTMAN;  Surgeon: Gladis Cough, MD;  Location: [facility];  Service: General;  Laterality: N/A;   EYE SURGERY     bilateral cataracts with lens implants   LAPAROSCOPIC PARTIAL COLECTOMY N/A 03/22/2019   Procedure: LAPAROSCOPY, SPLENIC FLEXURE MOBILIZATION WITH OPEN HARTMANN PROCEDURE;  Surgeon: Gladis Cough, MD;  Location: [facility];  Service: General;  Laterality: N/A;   LAPAROTOMY N/A 07/22/2020   Procedure: EXPLORATORY LAPAROTOMY lysis of adhesions;  Surgeon: Vernetta Berg, MD;  Location: [facility];  Service: General;  Laterality: N/A;    reports that she quit smoking about 12 years ago. Her smoking use included cigarettes. She started smoking about 42 years ago. She has a 30 pack-year smoking history. She has never been exposed to tobacco smoke. She has never used smokeless tobacco. She reports current alcohol use. She reports that she does not use drugs. family history includes Breast cancer in her sister; Colon cancer in her sister; Heart disease in her father; Hip fracture in her mother; Hypertension in an other family member. Allergies  Allergen Reactions   Doxycycline  Nausea And Vomiting   Observations/Objective: Alert, NAD, appropriate mood and affect, resps normal, cn 2-12 intact, moves all 4s,  no visible rash or swelling Lab Results  Component Value Date   WBC 7.8 05/26/2024   HGB 12.5 05/26/2024   HCT 38.8 05/26/2024   PLT 228 05/26/2024   GLUCOSE 92 05/26/2024   CHOL 210 (H) 08/17/2023   TRIG 81.0 08/17/2023   HDL 62.60 08/17/2023    LDLDIRECT 142.7 11/10/2011   LDLCALC 131 (H) 08/17/2023   ALT 8 05/26/2024   AST 22 05/26/2024   NA 141 05/26/2024   K 3.7 05/26/2024   CL 102 05/26/2024   CREATININE 0.59 05/26/2024   BUN 9 05/26/2024   CO2 27 05/26/2024   TSH 1.04 12/22/2023   INR 0.97 04/18/2018   HGBA1C 5.7 12/22/2023   Assessment and Plan: See notes  Follow Up Instructions: See notes   I discussed the assessment and treatment plan with the patient. The patient was provided an opportunity to ask questions and all were answered. The patient agreed with the plan and demonstrated an understanding of the instructions.   The patient was advised to call back or seek an in-person evaluation if the symptoms worsen or if the condition fails to improve as anticipated.  Lynwood Rush, MD

## 2024-05-23 NOTE — Telephone Encounter (Signed)
 Copied from CRM [id number]. Topic: General - Other >> May 23, 2024  1:55 PM Drema MATSU wrote: Reason for CRM: Patient son stated that they are still waiting in the virtual waiting room for appt.

## 2024-05-24 DIAGNOSIS — J449 Chronic obstructive pulmonary disease, unspecified: Secondary | ICD-10-CM | POA: Diagnosis not present

## 2024-05-26 ENCOUNTER — Encounter: Payer: Self-pay | Admitting: Internal Medicine

## 2024-05-26 ENCOUNTER — Emergency Department (HOSPITAL_COMMUNITY): Admission: EM | Admit: 2024-05-26 | Discharge: 2024-05-27 | Disposition: A | Discharge status: Home / Self Care

## 2024-05-26 ENCOUNTER — Encounter (HOSPITAL_COMMUNITY): Payer: Self-pay

## 2024-05-26 ENCOUNTER — Emergency Department (HOSPITAL_COMMUNITY)

## 2024-05-26 ENCOUNTER — Telehealth: Payer: Self-pay

## 2024-05-26 DIAGNOSIS — G9389 Other specified disorders of brain: Secondary | ICD-10-CM | POA: Diagnosis not present

## 2024-05-26 DIAGNOSIS — I6782 Cerebral ischemia: Secondary | ICD-10-CM | POA: Diagnosis not present

## 2024-05-26 DIAGNOSIS — R4189 Other symptoms and signs involving cognitive functions and awareness: Secondary | ICD-10-CM | POA: Diagnosis not present

## 2024-05-26 DIAGNOSIS — Z8673 Personal history of transient ischemic attack (TIA), and cerebral infarction without residual deficits: Secondary | ICD-10-CM | POA: Insufficient documentation

## 2024-05-26 DIAGNOSIS — I6523 Occlusion and stenosis of bilateral carotid arteries: Secondary | ICD-10-CM | POA: Diagnosis not present

## 2024-05-26 DIAGNOSIS — K769 Liver disease, unspecified: Secondary | ICD-10-CM | POA: Insufficient documentation

## 2024-05-26 DIAGNOSIS — R410 Disorientation, unspecified: Secondary | ICD-10-CM

## 2024-05-26 DIAGNOSIS — F419 Anxiety disorder, unspecified: Secondary | ICD-10-CM | POA: Insufficient documentation

## 2024-05-26 DIAGNOSIS — R4182 Altered mental status, unspecified: Secondary | ICD-10-CM | POA: Insufficient documentation

## 2024-05-26 DIAGNOSIS — I517 Cardiomegaly: Secondary | ICD-10-CM | POA: Insufficient documentation

## 2024-05-26 DIAGNOSIS — I2510 Atherosclerotic heart disease of native coronary artery without angina pectoris: Secondary | ICD-10-CM | POA: Insufficient documentation

## 2024-05-26 DIAGNOSIS — F0392 Unspecified dementia, unspecified severity, with psychotic disturbance: Secondary | ICD-10-CM | POA: Diagnosis not present

## 2024-05-26 DIAGNOSIS — F69 Unspecified disorder of adult personality and behavior: Secondary | ICD-10-CM | POA: Diagnosis not present

## 2024-05-26 LAB — URINALYSIS, W/ REFLEX TO CULTURE (INFECTION SUSPECTED)
Bilirubin Urine: NEGATIVE
Glucose, UA: NEGATIVE mg/dL
Ketones, ur: 5 mg/dL — AB
Leukocytes,Ua: NEGATIVE
Nitrite: NEGATIVE
Protein, ur: NEGATIVE mg/dL
Specific Gravity, Urine: 1.035 — ABNORMAL HIGH (ref 1.005–1.030)
pH: 6 (ref 5.0–8.0)

## 2024-05-26 LAB — COMPREHENSIVE METABOLIC PANEL WITH GFR
ALT: 8 U/L (ref 0–44)
AST: 22 U/L (ref 15–41)
Albumin: 3.9 g/dL (ref 3.5–5.0)
Alkaline Phosphatase: 55 U/L (ref 38–126)
Anion gap: 11 (ref 5–15)
BUN: 9 mg/dL (ref 8–23)
CO2: 27 mmol/L (ref 22–32)
Calcium: 9.9 mg/dL (ref 8.9–10.3)
Chloride: 102 mmol/L (ref 98–111)
Creatinine, Ser: 0.59 mg/dL (ref 0.44–1.00)
GFR, Estimated: >60 mL/min (ref >60)
Glucose, Bld: 92 mg/dL (ref 70–99)
Potassium: 3.7 mmol/L (ref 3.5–5.1)
Sodium: 141 mmol/L (ref 135–145)
Total Bilirubin: 0.5 mg/dL (ref 0.0–1.2)
Total Protein: 7.3 g/dL (ref 6.5–8.1)

## 2024-05-26 LAB — CBC WITH DIFFERENTIAL/PLATELET
Abs Immature Granulocytes: 0.02 K/uL (ref 0.00–0.07)
Basophils Absolute: 0 K/uL (ref 0.0–0.1)
Basophils Relative: 1 %
Eosinophils Absolute: 0.1 K/uL (ref 0.0–0.5)
Eosinophils Relative: 1 %
HCT: 38.8 % (ref 36.0–46.0)
Hemoglobin: 12.5 g/dL (ref 12.0–15.0)
Immature Granulocytes: 0 %
Lymphocytes Relative: 26 %
Lymphs Abs: 2 K/uL (ref 0.7–4.0)
MCH: 30.9 pg (ref 26.0–34.0)
MCHC: 32.2 g/dL (ref 30.0–36.0)
MCV: 96 fL (ref 80.0–100.0)
Monocytes Absolute: 0.5 K/uL (ref 0.1–1.0)
Monocytes Relative: 7 %
Neutro Abs: 5.1 K/uL (ref 1.7–7.7)
Neutrophils Relative %: 65 %
Platelets: 228 K/uL (ref 150–400)
RBC: 4.04 MIL/uL (ref 3.87–5.11)
RDW: 15.4 % (ref 11.5–15.5)
WBC: 7.8 K/uL (ref 4.0–10.5)
nRBC: 0 % (ref 0.0–0.2)

## 2024-05-26 LAB — BLOOD GAS, VENOUS
Acid-Base Excess: 8 mmol/L — ABNORMAL HIGH (ref 0.0–2.0)
Bicarbonate: 32.8 mmol/L — ABNORMAL HIGH (ref 20.0–28.0)
O2 Saturation: 72 %
Patient temperature: 37
pCO2, Ven: 45 mmHg (ref 44–60)
pH, Ven: 7.47 — ABNORMAL HIGH (ref 7.25–7.43)
pO2, Ven: 42 mmHg (ref 32–45)

## 2024-05-26 LAB — AMMONIA: Ammonia: 16 umol/L (ref 9–35)

## 2024-05-26 MED ORDER — LACTATED RINGERS IV BOLUS
1000.0000 mL | Freq: Once | INTRAVENOUS | Status: AC
  Administered 2024-05-26: 1000 mL via INTRAVENOUS

## 2024-05-26 MED ORDER — IOHEXOL 300 MG/ML  SOLN
100.0000 mL | Freq: Once | INTRAMUSCULAR | Status: AC | PRN
  Administered 2024-05-26: 80 mL via INTRAVENOUS

## 2024-05-26 NOTE — ED Triage Notes (Signed)
 Pt BIB ems after son called states she has been more altered than usual since yesterday. Hx of UTIs. VS stable

## 2024-05-26 NOTE — Assessment & Plan Note (Signed)
 Stable , cont inhaler prn

## 2024-05-26 NOTE — Telephone Encounter (Signed)
 Copied from CRM #[id number]. Topic: General - Other >> May 26, 2024  9:48 AM Alfonso HERO wrote: Reason for CRM: patients son calling advising that [facility] had sent over a request for notes that were missing from the hospital bed order. The patient needs the bed ASAP. Can these notes be sent over so the order can be completed.

## 2024-05-26 NOTE — Patient Instructions (Signed)
 Your hospital bed script is sent to [facility]  Please take all new medication as prescribed - the buspar for anxiety  Please continue all other medications as before, and refills have been done for tramadol    Please have the pharmacy call with any other refills you may need.  Please keep your appointments with your specialists as you may have planned

## 2024-05-26 NOTE — Assessment & Plan Note (Signed)
 Ok for buspar 5 bid prn

## 2024-05-26 NOTE — Assessment & Plan Note (Signed)
 Mod to severe, pt request refill tramadol  prn

## 2024-05-26 NOTE — Assessment & Plan Note (Signed)
 Mod to severe  - for hospital bed

## 2024-05-26 NOTE — ED Notes (Signed)
 Attempted to cath pt, catheterization successful, but urine sample not obtained due to not enough urine in pt bladder. Pt urinated in brief earlier today, pt states she does not need to go. Attempting bladder scan now.

## 2024-05-26 NOTE — Discharge Instructions (Signed)
 Please return to the emergency department if develop fevers, chills, severe headache, vision loss, facial droop, unilateral weakness, chest pain, shortness of breath, uncontrolled nausea vomiting, passout or develops any new or worsening symptoms that are concerning to you.

## 2024-05-26 NOTE — ED Provider Notes (Signed)
 [facility] AT [facility] Provider Note   CSN: [id number] Arrival date & time: 05/26/24  1455     Patient presents with: Altered Mental Status   Theresa Arnold is a 89 y.o. female.   89 year old presenting emergency department for altered mental status per EMS report.  Patient alert and oriented x 3 on my interview, she is not quite sure why she is here.  Possibly due to weakness.  She denies pain or symptoms.  She does have some underlying dementia/cognitive impairment.   Altered Mental Status      Prior to Admission medications   Medication Sig Start Date End Date Taking? Authorizing Provider  Brexpiprazole  (REXULTI ) 0.5 MG TABS 1 tab by mouth twice per day Patient taking differently: Take 0.5 mg by mouth in the morning and at bedtime. 1 tab by mouth twice per day 01/24/24   Norleen Lynwood ORN, MD  busPIRone (BUSPAR) 5 MG tablet Take 1 tablet (5 mg total) by mouth 2 (two) times daily. 05/23/24   Norleen Lynwood ORN, MD  desonide (DESOWEN) 0.05 % cream 1 application to affected area Externally Twice a day; Duration: 30 Days Patient not taking: Reported on 05/10/2024 05/22/14   [provider]  potassium chloride  (KLOR-CON  10) 10 MEQ tablet Take 1 tablet (10 mEq total) by mouth daily. 01/24/24   Norleen Lynwood ORN, MD  Rivaroxaban (XARELTO) 15 MG TABS tablet Take 1 tablet (15 mg total) by mouth 2 (two) times daily with a meal for 17 days. 04/21/24 05/10/24  Odell Celinda Balo, MD  rivaroxaban (XARELTO) 20 MG TABS tablet Take 1 tablet (20 mg total) by mouth daily with supper. 05/08/24   Odell Celinda Balo, MD  traMADol  (ULTRAM ) 50 MG tablet Take 1 tablet (50 mg total) by mouth every 6 (six) hours as needed. 05/23/24   Norleen Lynwood ORN, MD  Wound Dressings (FOAM DRESSING BORDERED) PADS Apply 1 each topically in the morning and at bedtime. 05/12/24   Charlyn Sora, MD    Allergies: Doxycycline     Review of Systems  Updated Vital Signs BP 125/64 (BP  Location: Right Arm)   Pulse (!) 104   Temp 99 F (37.2 C) (Oral)   Resp (!) 24   SpO2 96%   Physical Exam Vitals and nursing note reviewed.  Constitutional:      General: She is not in acute distress. HENT:     Head: Normocephalic.     Nose: Nose normal.     Mouth/Throat:     Mouth: Mucous membranes are moist.  Eyes:     Conjunctiva/sclera: Conjunctivae normal.  Cardiovascular:     Rate and Rhythm: Normal rate and regular rhythm.  Pulmonary:     Effort: Pulmonary effort is normal.  Abdominal:     General: Abdomen is flat. There is no distension.     Palpations: Abdomen is soft.     Tenderness: There is no abdominal tenderness. There is no guarding or rebound.  Musculoskeletal:        General: Normal range of motion.  Skin:    General: Skin is warm and dry.     Capillary Refill: Capillary refill takes less than 2 seconds.  Neurological:     General: No focal deficit present.     Mental Status: She is alert and oriented to person, place, and time.  Psychiatric:        Mood and Affect: Mood normal.        Behavior: Behavior normal.     (  all labs ordered are listed, but only abnormal results are displayed) Labs Reviewed  URINALYSIS, W/ REFLEX TO CULTURE (INFECTION SUSPECTED) - Abnormal; Notable for the following components:      Result Value   Specific Gravity, Urine 1.035 (*)    Hgb urine dipstick SMALL (*)    Ketones, ur 5 (*)    Bacteria, UA RARE (*)    All other components within normal limits  BLOOD GAS, VENOUS - Abnormal; Notable for the following components:   pH, Ven 7.47 (*)    Bicarbonate 32.8 (*)    Acid-Base Excess 8.0 (*)    All other components within normal limits  CBC WITH DIFFERENTIAL/PLATELET  COMPREHENSIVE METABOLIC PANEL WITH GFR  AMMONIA    EKG: None  Radiology: CT ABDOMEN PELVIS W CONTRAST Result Date: 05/26/2024 EXAM: CT ABDOMEN AND PELVIS WITH CONTRAST 05/26/2024 06:38:42 PM TECHNIQUE: CT of the abdomen and pelvis was performed  with the administration of 80 mL of iohexol  (OMNIPAQUE ) 300 MG/ML solution. Multiplanar reformatted images are provided for review. Automated exposure control, iterative reconstruction, and/or weight-based adjustment of the mA/kV was utilized to reduce the radiation dose to as low as reasonably achievable. COMPARISON: None available. CLINICAL HISTORY: Bowel obstruction suspected. FINDINGS: LOWER CHEST: Partially visualized cardiomegaly. Atherosclerotic plaque. LIVER: Subcentimeter hypodensity within the left and right hepatic lobes, too small to characterize. GALLBLADDER AND BILE DUCTS: Gallbladder is unremarkable. No biliary ductal dilatation. SPLEEN: No acute abnormality. PANCREAS: Diffusely atrophic. No focal lesion. Otherwise normal pancreatic contour. No surrounding inflammatory changes. No main pancreatic ductal dilatation. ADRENAL GLANDS: No acute abnormality. KIDNEYS, URETERS AND BLADDER: No stones in the kidneys or ureters. No hydronephrosis. No perinephric or periureteral stranding. No filling defects of the partially visualized collecting systems on delayed imaging. Urinary bladder is unremarkable. GI AND BOWEL: Stomach demonstrates no acute abnormality. Colonic diverticulosis. Sigmoid resection. No small or large bowel thickening or dilatation. The appendix is not definitely identified with no inflammatory changes in the right lower quadrant to suggest acute appendicitis. There is no bowel obstruction. PERITONEUM AND RETROPERITONEUM: No ascites. No free air. VASCULATURE: Aorta is normal in caliber. LYMPH NODES: No lymphadenopathy. REPRODUCTIVE ORGANS: The uterus is unremarkable. No adnexal mass. Stable coarse calcifications along the left adnexa within the expected ovary. BONES AND SOFT TISSUES: Diffusely decreased bone density. No acute osseous abnormality. No focal soft tissue abnormality. IMPRESSION: 1. No acute findings. 2. Other, non-acute and/or normal findings as above. Electronically signed by:  Morgane Naveau MD 05/26/2024 07:09 PM EST RP Workstation: [id number]   CT Head Wo Contrast Result Date: 05/26/2024 EXAM: CT HEAD WITHOUT CONTRAST 05/26/2024 06:38:42 PM TECHNIQUE: CT of the head was performed without the administration of intravenous contrast. Automated exposure control, iterative reconstruction, and/or weight based adjustment of the mA/kV was utilized to reduce the radiation dose to as low as reasonably achievable. COMPARISON: CT head without contrast 05/25/2021. CLINICAL HISTORY: Mental status change, unknown cause. FINDINGS: BRAIN AND VENTRICLES: No acute hemorrhage. No evidence of acute infarct. No hydrocephalus. No extra-axial collection. No mass effect or midline shift. Patchy and confluent decreased attenuation throughout deep and periventricular white matter bilaterally, compatible with chronic microvascular ischemic disease. Cerebral ventricle sizes concordant with cerebral volume loss. Lacunar infarcts are present in the thalami bilaterally. Atherosclerotic calcifications in cavernous internal carotid arteries. ORBITS: No acute abnormality. Bilateral lens replacement. SINUSES: No acute abnormality. SOFT TISSUES AND SKULL: No acute soft tissue abnormality. No skull fracture. Cerumen in bilateral external auditory canals. IMPRESSION: 1. No acute intracranial abnormality. 2. 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 3. Cerebral volume loss with concordant ventricular enlargement. 4. Atherosclerotic calcifications in the cavernous internal carotid arteries. Electronically signed by: Lonni Necessary MD 05/26/2024 06:55 PM EST RP Workstation: [id number]   DG Chest Port 1 View Result Date: 05/26/2024 EXAM: 1 VIEW(S) XRAY OF THE CHEST 05/26/2024 04:05:00 PM COMPARISON: 04/17/2024 CLINICAL HISTORY: [id number] AMS (altered mental status) [id number] AMS (altered mental status) FINDINGS: LUNGS AND PLEURA: No focal pulmonary opacity. No pleural effusion. No  pneumothorax. HEART AND MEDIASTINUM: No acute abnormality of the cardiac and mediastinal silhouettes. BONES AND SOFT TISSUES: No acute osseous abnormality. IMPRESSION: 1. No acute cardiopulmonary disease. Electronically signed by: Lynwood Seip MD 05/26/2024 04:16 PM EST RP Workstation: [id number]     Procedures   Medications Ordered in the ED  iohexol  (OMNIPAQUE ) 300 MG/ML solution 100 mL (80 mLs Intravenous Contrast Given 05/26/24 1821)  lactated ringers  bolus 1,000 mL (0 mLs Intravenous Stopped 05/26/24 2242)    Clinical Course as of 05/26/24 2340  Fri May 26, 2024  1520 Admitted 10/6 with DVT and complicated UTI.  Started on Xarelto. [TY]  1527 No food or drink since yesterday. Confusion; hallucinations. Added some pain meds and anxiety at rehab 2-3 weeks ago, but has been tolerating them without issues. Normally has some waxing and waning cognition, but alert and able to mostly carry on conversation. Seems more sleepy and out of it and may be hallucinating; keeps telling son she was doing a crossword puzzle when he asked why she is patting her thighs. No fevers, N/V/D, cough/SHOB. Typically can feed herself at meals, but needs extensive help with ADLS and ambulation.  [TY]  1951 CT Head Wo Contrast IMPRESSION: 1. No acute intracranial abnormality. 2. Chronic microvascular ischemic disease with bilateral thalamic lacunar infarcts. 3. Cerebral volume loss with concordant ventricular enlargement. 4. Atherosclerotic calcifications in the cavernous internal carotid arteries.   [TY]  1951 CT ABDOMEN PELVIS W CONTRAST IMPRESSION: 1. No acute findings. 2. Other, non-acute and/or normal findings as above.   [TY]  2338 Workup today largely reassuring.  Labs with no leukocytosis to suggest systemic infection.  No anemia.  Comprehensive panel within normal limits.  No transaminitis to suggest hepatic encephalopathy.  Ammonia normal.  Blood gas without CO2 retention.  UA without evidence of  urinary tract infection.  It does not appear that she has an infectious process. I did discuss workup and findings with son who feels comfortable with mother going home.  He will follow-up with his primary doctor.  Was given return precautions.  Stable for discharge at this time. [TY]    Clinical Course User Index [TY] Neysa Caron PARAS, DO                                 Medical Decision Making 89 year old female presenting emergency department altered mental status.  Was unable to provide much history; son contacted further HPI and ED course.  Vital signs reassuring.  She has no focal neurodeficits on exam to suggest acute stroke.  No overt external signs of infection.  She is alert and oriented x 3 on my exam, but per son does have some waxing and waning cognition/dementia.  She has a benign abdominal exam.  Broad workup to evaluate for organic cause for her altered mental status underway, however symptoms may be secondary to worsening of her underlying dementia or perhaps secondary to polypharmacy.  See ED course  for further MDM disposition.  Amount and/or Complexity of Data Reviewed External Data Reviewed:     Details: Not on blood thinner.  Was seen several weeks ago with sacral decubitus.  Do not appreciate on my exam Labs: ordered. Decision-making details documented in ED Course. Radiology: ordered and independent interpretation performed. Decision-making details documented in ED Course.    Details: No intracranial hemorrhage on CT head, independent review.  No free air on CT abdomen ECG/medicine tests: ordered.  Risk Prescription drug management. Decision regarding hospitalization. Diagnosis or treatment significantly limited by social determinants of health. Risk Details: Lives with son, dependent on ADLs      Final diagnoses:  None    ED Discharge Orders     None          Neysa Caron PARAS, DO 05/26/24 2340

## 2024-05-26 NOTE — Assessment & Plan Note (Signed)
 Pt declines further lab for now

## 2024-05-27 DIAGNOSIS — Z7401 Bed confinement status: Secondary | ICD-10-CM | POA: Diagnosis not present

## 2024-05-27 NOTE — ED Notes (Signed)
 Theresa Arnold called and let know patient was up for discharge and confirming someone is going to be home

## 2024-05-27 NOTE — ED Notes (Signed)
 Called patient son to alert him that patient is on their way home

## 2024-05-27 NOTE — ED Notes (Signed)
 [facility] called and setup transportation

## 2024-05-29 DIAGNOSIS — I959 Hypotension, unspecified: Secondary | ICD-10-CM | POA: Diagnosis not present

## 2024-05-29 DIAGNOSIS — E785 Hyperlipidemia, unspecified: Secondary | ICD-10-CM | POA: Diagnosis not present

## 2024-05-29 DIAGNOSIS — F0393 Unspecified dementia, unspecified severity, with mood disturbance: Secondary | ICD-10-CM | POA: Diagnosis not present

## 2024-05-29 DIAGNOSIS — Z9181 History of falling: Secondary | ICD-10-CM | POA: Diagnosis not present

## 2024-05-29 DIAGNOSIS — Z87440 Personal history of urinary (tract) infections: Secondary | ICD-10-CM | POA: Diagnosis not present

## 2024-05-29 DIAGNOSIS — I10 Essential (primary) hypertension: Secondary | ICD-10-CM | POA: Diagnosis not present

## 2024-05-29 DIAGNOSIS — I82401 Acute embolism and thrombosis of unspecified deep veins of right lower extremity: Secondary | ICD-10-CM | POA: Diagnosis not present

## 2024-05-29 NOTE — Telephone Encounter (Signed)
 Form has been placed on provider desk.

## 2024-05-30 ENCOUNTER — Telehealth: Payer: Self-pay

## 2024-05-30 NOTE — Telephone Encounter (Signed)
 Form has been placed on provider desk.

## 2024-05-30 NOTE — Telephone Encounter (Signed)
 Copied from CRM #[id number]. Topic: General - Other >> May 26, 2024  9:48 AM Alfonso HERO wrote: Reason for CRM: patients son calling advising that [employer] [facility] had sent over a request for notes that were missing from the hospital bed order. The patient needs the bed ASAP. Can these notes be sent over so the order can be completed. >> May 30, 2024 11:32 AM Corin V wrote: Patient son, Yancy, called about this. Called [employer] but did not get a response. Please provide update as soon as possible for when this can get back to [employer] for them to get the bed to the patient. Callback: [phone].

## 2024-05-31 NOTE — Telephone Encounter (Unsigned)
 Copied from CRM #[id number]. Topic: General - Other >> May 26, 2024  9:48 AM Alfonso HERO wrote: Reason for CRM: patients son calling advising that [employer] [facility] had sent over a request for notes that were missing from the hospital bed order. The patient needs the bed ASAP. Can these notes be sent over so the order can be completed. >> May 31, 2024 12:43 PM Avram MATSU wrote: Son would like a call back with an update. I informed son of the turn around time but he stated it has been more days than that. >> May 30, 2024 11:32 AM Luevenia V wrote: Patient son, Yancy, called about this. Called [employer] but did not get a response. Please provide update as soon as possible for when this can get back to [employer] for them to get the bed to the patient. Callback: [phone].

## 2024-06-01 DIAGNOSIS — Z87440 Personal history of urinary (tract) infections: Secondary | ICD-10-CM | POA: Diagnosis not present

## 2024-06-01 DIAGNOSIS — Z9181 History of falling: Secondary | ICD-10-CM | POA: Diagnosis not present

## 2024-06-01 DIAGNOSIS — E785 Hyperlipidemia, unspecified: Secondary | ICD-10-CM | POA: Diagnosis not present

## 2024-06-01 DIAGNOSIS — I959 Hypotension, unspecified: Secondary | ICD-10-CM | POA: Diagnosis not present

## 2024-06-01 DIAGNOSIS — I82401 Acute embolism and thrombosis of unspecified deep veins of right lower extremity: Secondary | ICD-10-CM | POA: Diagnosis not present

## 2024-06-01 DIAGNOSIS — F0393 Unspecified dementia, unspecified severity, with mood disturbance: Secondary | ICD-10-CM | POA: Diagnosis not present

## 2024-06-01 DIAGNOSIS — J449 Chronic obstructive pulmonary disease, unspecified: Secondary | ICD-10-CM | POA: Diagnosis not present

## 2024-06-01 NOTE — Telephone Encounter (Signed)
Forms have been faxed back today

## 2024-06-01 NOTE — Telephone Encounter (Signed)
 Forms has been faxed to [employer] today.

## 2024-06-01 NOTE — Telephone Encounter (Unsigned)
 Copied from CRM #[id number]. Topic: General - Other >> Jun 01, 2024 11:12 AM Drema MATSU wrote: Reason for CRM: Kirby with [employer] [facility] is wanting clinic to refax paperwork regarding hospital bed because she can barely read the form due to alot of lines in it.

## 2024-06-02 ENCOUNTER — Telehealth: Payer: Self-pay

## 2024-06-02 NOTE — Telephone Encounter (Signed)
Forms were re faxed this morning

## 2024-06-02 NOTE — Telephone Encounter (Signed)
 Forms has been re-faxed today.

## 2024-06-02 NOTE — Telephone Encounter (Signed)
 Copied from CRM [id number]. Topic: General - Other >> Jun 01, 2024  4:35 PM Viola F wrote: Reason for CRM: Burnard from [facility] needs the forms for patients hospital bed refaxed because the fax came through with lines in it. She could not provide the fax number but was hoping whoever did fax it can just ressend it to the same fax number.

## 2024-06-06 DIAGNOSIS — J449 Chronic obstructive pulmonary disease, unspecified: Secondary | ICD-10-CM | POA: Diagnosis not present

## 2024-06-12 ENCOUNTER — Other Ambulatory Visit: Payer: Self-pay | Admitting: Internal Medicine

## 2024-06-16 ENCOUNTER — Other Ambulatory Visit: Payer: Self-pay | Admitting: Internal Medicine

## 2024-06-28 ENCOUNTER — Other Ambulatory Visit: Payer: Self-pay | Admitting: Internal Medicine

## 2024-06-28 ENCOUNTER — Ambulatory Visit: Payer: Self-pay

## 2024-06-28 ENCOUNTER — Other Ambulatory Visit: Payer: Self-pay

## 2024-06-28 DIAGNOSIS — L89159 Pressure ulcer of sacral region, unspecified stage: Secondary | ICD-10-CM

## 2024-06-28 NOTE — Telephone Encounter (Signed)
 FYI Only or Action Required?: Action required by provider: clinical question for provider and update on patient condition.  Patient was last seen in primary care on 05/23/2024 by Norleen Lynwood ORN, MD.  Called Nurse Triage reporting Open Wound.  Symptoms began several months ago.  Interventions attempted: Other: barrier cream, foam dressings and pressure reduction.  Symptoms are: gradually worsening.  Triage Disposition: See Physician Within 24 Hours  Patient/caregiver understands and will follow disposition?: No, wishes to speak with PCP   Copied from CRM #[id number]. Topic: Clinical - Red Word Triage >> Jun 28, 2024 11:37 AM Carlyon D wrote: Red Word that prompted transfer to Nurse Triage: Pt son Yancy is calling in regards to mother having multiple open sore wounds on her behind.  Wounds are bleeding and this has been going on for a while pt son adrian states it started off with one sore now theres more.   Reason for Disposition  [1] Wound > 48 hours old AND [2] becoming more painful or tender to touch  Answer Assessment - Initial Assessment Questions Returned call to pt's son, Yancy, to f/u on call. He states he is not currently with the patient. States that she had a sacral wound for several months; hospitalized 10/31 and given barrier cream and foam dressings with wound care instructions. He reports that they use private caregivers as well as [facility]. He states they do routinely assist pt with turns and have foam pads to relieve pressure off sacrum but wound is not healing. He states wound is red, skin is broken and bleeds at times. He denies any fevers, darkening of skin or drainage. Offered appt with PCP but he states it is very difficult to get pt out and to appts. Preferred to receive further wound care instructions or wound care orders to [facility] agency. Please advise.    1. LOCATION: Where is the wound located?      Sacrum   2. WOUND APPEARANCE: What does the wound look like?       Red, broken skin, some bleeding   3. SIZE: If redness is present, ask: What is the size of the red area? (Inches, centimeters, or compare to size of a coin)      Unknown; states that he is not currently with pt   4. SPREAD: What's changed in the last day?  Do you see any red streaks coming from the wound?     Wound just seems to not be getting better despite wound care from caregivers and routine turns to prevent further pressure to area   5. ONSET: When did it start to look infected?      Wound has been present for months; states that pt admitted to ED 10/31 for pressure injury and just is not healing   6. MECHANISM: How did the wound start, what was the cause?     Pressure injury; it's been months per son; per chart pt went to ED with complications of PI 10/31  7. PAIN: Do you have any pain?  If Yes, ask: How bad is the pain?  (e.g., Scale 1-10; mild, moderate, or severe)     Unknown; not with pt   8. FEVER: Do you have a fever? If Yes, ask: What is your temperature, how was it measured, and when did it start?     Denies fever   9. OTHER SYMPTOMS: Do you have any other symptoms? (e.g., shaking chills, weakness, rash elsewhere on body)     Denies  other symptoms  Protocols used: Wound Infection Suspected-A-AH

## 2024-06-29 ENCOUNTER — Telehealth: Payer: Self-pay

## 2024-06-29 NOTE — Telephone Encounter (Signed)
 As I am not certain about treatment, I will refer to wound clinic, thanks

## 2024-06-29 NOTE — Telephone Encounter (Signed)
 Ok for [employer]

## 2024-06-29 NOTE — Telephone Encounter (Signed)
 Copied from CRM #[id number]. Topic: Clinical - Home Health Verbal Orders >> Jun 29, 2024 11:23 AM Alfonso ORN wrote: Caller/Agency: [facility]  Callback Number: [phone] Service Requested: Skilled Nursing Frequency: none prior to first visit Any new concerns about the patient? Yes; bedsores reported

## 2024-06-30 NOTE — Telephone Encounter (Signed)
 Called and left voicemail giving verbals.

## 2024-07-18 ENCOUNTER — Telehealth: Payer: Self-pay

## 2024-07-18 NOTE — Telephone Encounter (Signed)
 Copied from CRM [id number]. Topic: Clinical - Medication Question >> Jul 18, 2024  1:55 PM Berneda F wrote: Reason for CRM: Son, Yancy, states he would like to know if we can increase the MG on the Tramadol  for this patient as it does not seem to be helping as much as they would like it to. He wanted to see if this could be done without an appt as patient is unable to come into the office.  They will do a VV if needed but wanted to see if we can up the dose without an appt first.  Please call [phone] Sherryle) with any updates

## 2024-07-19 NOTE — Telephone Encounter (Signed)
 Oh no sorry, the 50 mg tramadol  is normally the highest dose, so I would not feel comfortable with more than this,    Thanks

## 2024-07-24 NOTE — Telephone Encounter (Signed)
 Pt son states understanding understanding no further questions at this time.

## 2024-07-26 ENCOUNTER — Encounter: Payer: Self-pay | Admitting: Internal Medicine

## 2024-07-26 ENCOUNTER — Telehealth (INDEPENDENT_AMBULATORY_CARE_PROVIDER_SITE_OTHER): Admitting: Internal Medicine

## 2024-07-26 DIAGNOSIS — L8990 Pressure ulcer of unspecified site, unspecified stage: Secondary | ICD-10-CM | POA: Insufficient documentation

## 2024-07-26 DIAGNOSIS — J449 Chronic obstructive pulmonary disease, unspecified: Secondary | ICD-10-CM

## 2024-07-26 DIAGNOSIS — L89306 Pressure-induced deep tissue damage of unspecified buttock: Secondary | ICD-10-CM | POA: Diagnosis not present

## 2024-07-26 DIAGNOSIS — M1712 Unilateral primary osteoarthritis, left knee: Secondary | ICD-10-CM

## 2024-07-26 MED ORDER — RIVAROXABAN 20 MG PO TABS: 20.0000 mg | ORAL_TABLET | Freq: Every day | ORAL | 0 refills | Status: AC

## 2024-07-26 MED ORDER — REXULTI 0.5 MG PO TABS: ORAL_TABLET | ORAL | 5 refills | Status: AC

## 2024-07-26 MED ORDER — HYDROCODONE-ACETAMINOPHEN 5-325 MG PO TABS: 1.0000 | ORAL_TABLET | Freq: Four times a day (QID) | ORAL | 0 refills | Status: AC | PRN

## 2024-07-26 NOTE — Assessment & Plan Note (Signed)
 Ok for [facility] with wound care nurse

## 2024-07-26 NOTE — Assessment & Plan Note (Signed)
 D/w pt and son, pt is essentially home bound with debility, no fever or worsening swelling but tramadol  no longer effective - ok for limited hydrocodone  5 325 qid prn, hopefully will not need long term, but also refer orthopedic for more definitive care

## 2024-07-26 NOTE — Progress Notes (Signed)
 Patient ID: Theresa Arnold, female   DOB: Nov 27, 1931, 89 y.o.   MRN: [id number]  Virtual Visit via Video Note  I connected with [name] on 07/26/2024 at  2:20 PM EST by a video enabled telemedicine application and verified that I am speaking with the correct person using two identifiers.  Location of all participants today Patient: at home with son present Provider: at office   I discussed the limitations of evaluation and management by telemedicine and the availability of in person appointments. The patient expressed understanding and agreed to proceed.  History of Present Illness: Here with uncontrolled pain without fever, trauma to left knee, has known DJD, tramadol  just effective enough.  Pain radiates toward the ankle but no leg swelling.  Also son mentions she has 2 relatively small decub ulcerations to the bilataral low medial buttock area.  Pt denies chest pain, increased sob or doe, wheezing, orthopnea, PND, increased LE swelling, palpitations, dizziness or syncope.   Pt denies polydipsia, polyuria, or new focal neuro s/s.  Past Medical History:  Diagnosis Date   CERVICAL RADICULOPATHY, LEFT 09/03/2010   Cervical spondylosis 11/10/2011   COPD (chronic obstructive pulmonary disease) (HCC) 11/10/2011   Diverticulitis    HTN (hypertension) 06/13/2021   HYPERLIPIDEMIA 09/03/2010   Sigmoid stricture (HCC)    Past Surgical History:  Procedure Laterality Date   COLONOSCOPY     COLOSTOMY REVERSAL N/A 09/04/2019   Procedure: PRIMARY COLOCOLOSTOMY REVERSAL AND TAKEDOWN OF HARTMAN;  Surgeon: Gladis Cough, MD;  Location: [facility];  Service: General;  Laterality: N/A;   EYE SURGERY     bilateral cataracts with lens implants   LAPAROSCOPIC PARTIAL COLECTOMY N/A 03/22/2019   Procedure: LAPAROSCOPY, SPLENIC FLEXURE MOBILIZATION WITH OPEN HARTMANN PROCEDURE;  Surgeon: Gladis Cough, MD;  Location: [facility];  Service: General;  Laterality: N/A;   LAPAROTOMY N/A 07/22/2020   Procedure:  EXPLORATORY LAPAROTOMY lysis of adhesions;  Surgeon: Vernetta Berg, MD;  Location: [facility];  Service: General;  Laterality: N/A;    reports that she quit smoking about 13 years ago. Her smoking use included cigarettes. She started smoking about 43 years ago. She has a 30 pack-year smoking history. She has never been exposed to tobacco smoke. She has never used smokeless tobacco. She reports current alcohol use. She reports that she does not use drugs. family history includes Breast cancer in her sister; Colon cancer in her sister; Heart disease in her father; Hip fracture in her mother; Hypertension in an other family member. Allergies[1]  Medications Ordered Prior to Encounter[2]   Observations/Objective: Alert, NAD, appropriate mood and affect, resps normal, cn 2-12 intact, moves all 4s, no visible rash or swelling Lab Results  Component Value Date   WBC 7.8 05/26/2024   HGB 12.5 05/26/2024   HCT 38.8 05/26/2024   PLT 228 05/26/2024   GLUCOSE 92 05/26/2024   CHOL 210 (H) 08/17/2023   TRIG 81.0 08/17/2023   HDL 62.60 08/17/2023   LDLDIRECT 142.7 11/10/2011   LDLCALC 131 (H) 08/17/2023   ALT 8 05/26/2024   AST 22 05/26/2024   NA 141 05/26/2024   K 3.7 05/26/2024   CL 102 05/26/2024   CREATININE 0.59 05/26/2024   BUN 9 05/26/2024   CO2 27 05/26/2024   TSH 1.04 12/22/2023   INR 0.97 04/18/2018   HGBA1C 5.7 12/22/2023   Assessment and Plan: See notes  Follow Up Instructions: See notes   I discussed the assessment and treatment plan with the patient. The patient was  provided an opportunity to ask questions and all were answered. The patient agreed with the plan and demonstrated an understanding of the instructions.   The patient was advised to call back or seek an in-person evaluation if the symptoms worsen or if the condition fails to improve as anticipated.  Lynwood Rush, MD     [1]  Allergies Allergen Reactions   Doxycycline  Nausea And Vomiting  [2]  Current  Outpatient Medications on File Prior to Visit  Medication Sig Dispense Refill   busPIRone  (BUSPAR ) 5 MG tablet TAKE 1 TABLET BY MOUTH TWICE A DAY 180 tablet 2   desonide (DESOWEN) 0.05 % cream 1 application to affected area Externally Twice a day; Duration: 30 Days (Patient not taking: Reported on 05/10/2024)     potassium chloride  (KLOR-CON  10) 10 MEQ tablet Take 1 tablet (10 mEq total) by mouth daily. 90 tablet 3   Rivaroxaban  (XARELTO ) 15 MG TABS tablet Take 1 tablet (15 mg total) by mouth 2 (two) times daily with a meal for 17 days. 34 tablet 0   traMADol  (ULTRAM ) 50 MG tablet TAKE 1 TABLET BY MOUTH EVERY 6 HOURS AS NEEDED. 60 tablet 1   Wound Dressings (FOAM DRESSING BORDERED) PADS Apply 1 each topically in the morning and at bedtime. 90 each 0   No current facility-administered medications on file prior to visit.

## 2024-07-26 NOTE — Assessment & Plan Note (Signed)
 Stable , cont inhaler prn

## 2024-07-26 NOTE — Patient Instructions (Signed)
 Please take all new medication as prescribed  Please continue all other medications as before,   Please have the pharmacy call with any other refills you may need.  Please keep your appointments with your specialists as you may have planned -   You will be contacted regarding the referral for: orthopedic, and Home Health with wound care nurse

## 2024-07-27 ENCOUNTER — Ambulatory Visit: Admitting: Physician Assistant

## 2024-07-27 ENCOUNTER — Ambulatory Visit

## 2024-07-28 ENCOUNTER — Ambulatory Visit

## 2024-08-02 ENCOUNTER — Telehealth: Payer: Self-pay | Admitting: Pharmacy Technician

## 2024-08-02 ENCOUNTER — Other Ambulatory Visit (HOSPITAL_COMMUNITY): Payer: Self-pay

## 2024-08-02 NOTE — Telephone Encounter (Signed)
 Pharmacy Patient Advocate Encounter  Received notification from [employer] that Prior Authorization for Rexulti  0.5MG  tablets has been APPROVED from 07/13/2024 to 07/12/2025.   PA #/Case ID/Reference #: [id number]

## 2024-08-10 ENCOUNTER — Other Ambulatory Visit: Payer: Self-pay | Admitting: Internal Medicine

## 2024-08-10 DIAGNOSIS — M1712 Unilateral primary osteoarthritis, left knee: Secondary | ICD-10-CM

## 2024-08-10 DIAGNOSIS — L89306 Pressure-induced deep tissue damage of unspecified buttock: Secondary | ICD-10-CM

## 2024-08-10 DIAGNOSIS — J449 Chronic obstructive pulmonary disease, unspecified: Secondary | ICD-10-CM
